# Patient Record
Sex: Female | Born: 1955 | Race: Black or African American | Hispanic: No | Marital: Single | Smoking: Never smoker
Health system: Southern US, Community
[De-identification: ages and names within clinical notes are randomized; demographics above are authoritative.]

## PROBLEM LIST (undated history)

## (undated) DIAGNOSIS — I350 Nonrheumatic aortic (valve) stenosis: Secondary | ICD-10-CM

## (undated) DIAGNOSIS — E119 Type 2 diabetes mellitus without complications: Secondary | ICD-10-CM

## (undated) DIAGNOSIS — M1990 Unspecified osteoarthritis, unspecified site: Secondary | ICD-10-CM

## (undated) DIAGNOSIS — T8859XA Other complications of anesthesia, initial encounter: Secondary | ICD-10-CM

## (undated) DIAGNOSIS — T4145XA Adverse effect of unspecified anesthetic, initial encounter: Secondary | ICD-10-CM

## (undated) DIAGNOSIS — I428 Other cardiomyopathies: Secondary | ICD-10-CM

## (undated) DIAGNOSIS — Z9889 Other specified postprocedural states: Secondary | ICD-10-CM

## (undated) DIAGNOSIS — I513 Intracardiac thrombosis, not elsewhere classified: Secondary | ICD-10-CM

## (undated) DIAGNOSIS — I4891 Unspecified atrial fibrillation: Principal | ICD-10-CM

## (undated) DIAGNOSIS — E785 Hyperlipidemia, unspecified: Secondary | ICD-10-CM

## (undated) DIAGNOSIS — R112 Nausea with vomiting, unspecified: Secondary | ICD-10-CM

## (undated) HISTORY — DX: Other cardiomyopathies: I42.8

## (undated) HISTORY — DX: Intracardiac thrombosis, not elsewhere classified: I51.3

## (undated) HISTORY — DX: Type 2 diabetes mellitus without complications: E11.9

## (undated) HISTORY — PX: JOINT REPLACEMENT: SHX530

## (undated) HISTORY — DX: Morbid (severe) obesity due to excess calories: E66.01

## (undated) HISTORY — DX: Hyperlipidemia, unspecified: E78.5

---

## 2003-10-02 ENCOUNTER — Other Ambulatory Visit (HOSPITAL_COMMUNITY): Admission: RE | Admit: 2003-10-02 | Discharge: 2003-10-13 | Payer: Self-pay | Admitting: Psychiatry

## 2003-11-01 ENCOUNTER — Encounter: Admission: RE | Admit: 2003-11-01 | Discharge: 2004-01-30 | Payer: Self-pay | Admitting: Unknown Physician Specialty

## 2003-11-17 ENCOUNTER — Encounter: Admission: RE | Admit: 2003-11-17 | Discharge: 2003-11-17 | Payer: Self-pay | Admitting: Psychiatry

## 2004-02-14 ENCOUNTER — Encounter: Admission: RE | Admit: 2004-02-14 | Discharge: 2004-05-14 | Payer: Self-pay | Admitting: Unknown Physician Specialty

## 2010-09-09 ENCOUNTER — Ambulatory Visit: Payer: Self-pay | Admitting: Gynecology

## 2013-07-28 ENCOUNTER — Emergency Department (HOSPITAL_COMMUNITY): Payer: Medicare Other

## 2013-07-28 ENCOUNTER — Encounter (HOSPITAL_COMMUNITY): Payer: Self-pay | Admitting: Emergency Medicine

## 2013-07-28 ENCOUNTER — Inpatient Hospital Stay (HOSPITAL_COMMUNITY)
Admission: EM | Admit: 2013-07-28 | Discharge: 2013-08-03 | DRG: 286 | Disposition: A | Discharge status: Home / Self Care | Payer: Medicare Other | Attending: Family Medicine | Admitting: Family Medicine

## 2013-07-28 DIAGNOSIS — I509 Heart failure, unspecified: Secondary | ICD-10-CM | POA: Diagnosis present

## 2013-07-28 DIAGNOSIS — T463X5A Adverse effect of coronary vasodilators, initial encounter: Secondary | ICD-10-CM | POA: Diagnosis not present

## 2013-07-28 DIAGNOSIS — E785 Hyperlipidemia, unspecified: Secondary | ICD-10-CM | POA: Diagnosis present

## 2013-07-28 DIAGNOSIS — I10 Essential (primary) hypertension: Secondary | ICD-10-CM | POA: Diagnosis present

## 2013-07-28 DIAGNOSIS — I5033 Acute on chronic diastolic (congestive) heart failure: Secondary | ICD-10-CM

## 2013-07-28 DIAGNOSIS — R0600 Dyspnea, unspecified: Secondary | ICD-10-CM

## 2013-07-28 DIAGNOSIS — I472 Ventricular tachycardia, unspecified: Secondary | ICD-10-CM | POA: Diagnosis present

## 2013-07-28 DIAGNOSIS — I4729 Other ventricular tachycardia: Secondary | ICD-10-CM | POA: Diagnosis not present

## 2013-07-28 DIAGNOSIS — Z87891 Personal history of nicotine dependence: Secondary | ICD-10-CM

## 2013-07-28 DIAGNOSIS — Z823 Family history of stroke: Secondary | ICD-10-CM

## 2013-07-28 DIAGNOSIS — I359 Nonrheumatic aortic valve disorder, unspecified: Secondary | ICD-10-CM | POA: Diagnosis present

## 2013-07-28 DIAGNOSIS — D649 Anemia, unspecified: Secondary | ICD-10-CM

## 2013-07-28 DIAGNOSIS — Z833 Family history of diabetes mellitus: Secondary | ICD-10-CM

## 2013-07-28 DIAGNOSIS — E1169 Type 2 diabetes mellitus with other specified complication: Secondary | ICD-10-CM | POA: Diagnosis present

## 2013-07-28 DIAGNOSIS — I428 Other cardiomyopathies: Secondary | ICD-10-CM | POA: Diagnosis present

## 2013-07-28 DIAGNOSIS — I513 Intracardiac thrombosis, not elsewhere classified: Secondary | ICD-10-CM | POA: Diagnosis present

## 2013-07-28 DIAGNOSIS — I5021 Acute systolic (congestive) heart failure: Secondary | ICD-10-CM | POA: Diagnosis present

## 2013-07-28 DIAGNOSIS — IMO0002 Reserved for concepts with insufficient information to code with codable children: Secondary | ICD-10-CM

## 2013-07-28 DIAGNOSIS — I4891 Unspecified atrial fibrillation: Principal | ICD-10-CM | POA: Diagnosis present

## 2013-07-28 DIAGNOSIS — Z96659 Presence of unspecified artificial knee joint: Secondary | ICD-10-CM

## 2013-07-28 DIAGNOSIS — I959 Hypotension, unspecified: Secondary | ICD-10-CM | POA: Diagnosis not present

## 2013-07-28 DIAGNOSIS — Z6841 Body Mass Index (BMI) 40.0 and over, adult: Secondary | ICD-10-CM

## 2013-07-28 DIAGNOSIS — I5041 Acute combined systolic (congestive) and diastolic (congestive) heart failure: Secondary | ICD-10-CM

## 2013-07-28 DIAGNOSIS — I429 Cardiomyopathy, unspecified: Secondary | ICD-10-CM | POA: Diagnosis present

## 2013-07-28 DIAGNOSIS — E669 Obesity, unspecified: Secondary | ICD-10-CM | POA: Diagnosis present

## 2013-07-28 DIAGNOSIS — R079 Chest pain, unspecified: Secondary | ICD-10-CM

## 2013-07-28 DIAGNOSIS — I5023 Acute on chronic systolic (congestive) heart failure: Secondary | ICD-10-CM | POA: Diagnosis present

## 2013-07-28 DIAGNOSIS — I249 Acute ischemic heart disease, unspecified: Secondary | ICD-10-CM | POA: Diagnosis present

## 2013-07-28 DIAGNOSIS — I2510 Atherosclerotic heart disease of native coronary artery without angina pectoris: Secondary | ICD-10-CM | POA: Diagnosis present

## 2013-07-28 DIAGNOSIS — G4733 Obstructive sleep apnea (adult) (pediatric): Secondary | ICD-10-CM | POA: Diagnosis present

## 2013-07-28 DIAGNOSIS — M1710 Unilateral primary osteoarthritis, unspecified knee: Secondary | ICD-10-CM | POA: Diagnosis present

## 2013-07-28 DIAGNOSIS — E119 Type 2 diabetes mellitus without complications: Secondary | ICD-10-CM | POA: Diagnosis present

## 2013-07-28 DIAGNOSIS — Z7982 Long term (current) use of aspirin: Secondary | ICD-10-CM

## 2013-07-28 DIAGNOSIS — R931 Abnormal findings on diagnostic imaging of heart and coronary circulation: Secondary | ICD-10-CM

## 2013-07-28 HISTORY — DX: Adverse effect of unspecified anesthetic, initial encounter: T41.45XA

## 2013-07-28 HISTORY — DX: Unspecified atrial fibrillation: I48.91

## 2013-07-28 HISTORY — DX: Unspecified osteoarthritis, unspecified site: M19.90

## 2013-07-28 HISTORY — DX: Other complications of anesthesia, initial encounter: T88.59XA

## 2013-07-28 HISTORY — DX: Other specified postprocedural states: R11.2

## 2013-07-28 HISTORY — DX: Other specified postprocedural states: Z98.890

## 2013-07-28 HISTORY — DX: Nonrheumatic aortic (valve) stenosis: I35.0

## 2013-07-28 LAB — CREATININE, SERUM
Creatinine, Ser: 0.89 mg/dL (ref 0.50–1.10)
GFR calc Af Amer: 82 mL/min — ABNORMAL LOW (ref >90)
GFR calc non Af Amer: 71 mL/min — ABNORMAL LOW (ref >90)

## 2013-07-28 LAB — BASIC METABOLIC PANEL
BUN: 14 mg/dL (ref 6–23)
CO2: 25 mEq/L (ref 19–32)
Calcium: 9.3 mg/dL (ref 8.4–10.5)
Chloride: 103 mEq/L (ref 96–112)
Creatinine, Ser: 0.91 mg/dL (ref 0.50–1.10)
GFR calc Af Amer: 80 mL/min — ABNORMAL LOW (ref >90)
GFR calc non Af Amer: 69 mL/min — ABNORMAL LOW (ref >90)
Glucose, Bld: 107 mg/dL — ABNORMAL HIGH (ref 70–99)
Potassium: 4 mEq/L (ref 3.7–5.3)
Sodium: 142 mEq/L (ref 137–147)

## 2013-07-28 LAB — I-STAT TROPONIN, ED: Troponin i, poc: 0 ng/mL (ref 0.00–0.08)

## 2013-07-28 LAB — CBC
HCT: 32.4 % — ABNORMAL LOW (ref 36.0–46.0)
HCT: 31.3 % — ABNORMAL LOW (ref 36.0–46.0)
Hemoglobin: 10.8 g/dL — ABNORMAL LOW (ref 12.0–15.0)
Hemoglobin: 10.3 g/dL — ABNORMAL LOW (ref 12.0–15.0)
MCH: 29.2 pg (ref 26.0–34.0)
MCH: 28.9 pg (ref 26.0–34.0)
MCHC: 33.3 g/dL (ref 30.0–36.0)
MCHC: 32.9 g/dL (ref 30.0–36.0)
MCV: 87.6 fL (ref 78.0–100.0)
MCV: 87.7 fL (ref 78.0–100.0)
Platelets: 246 10*3/uL (ref 150–400)
Platelets: 236 10*3/uL (ref 150–400)
RBC: 3.7 MIL/uL — ABNORMAL LOW (ref 3.87–5.11)
RBC: 3.57 MIL/uL — ABNORMAL LOW (ref 3.87–5.11)
RDW: 14.9 % (ref 11.5–15.5)
RDW: 14.9 % (ref 11.5–15.5)
WBC: 6 10*3/uL (ref 4.0–10.5)
WBC: 6.3 10*3/uL (ref 4.0–10.5)

## 2013-07-28 LAB — D-DIMER, QUANTITATIVE: D-Dimer, Quant: 1.31 ug/mL-FEU — ABNORMAL HIGH (ref 0.00–0.48)

## 2013-07-28 LAB — HEMOGLOBIN A1C
Hgb A1c MFr Bld: 6.5 % — ABNORMAL HIGH (ref <5.7)
Mean Plasma Glucose: 140 mg/dL — ABNORMAL HIGH (ref <117)

## 2013-07-28 LAB — PRO B NATRIURETIC PEPTIDE: Pro B Natriuretic peptide (BNP): 1219 pg/mL — ABNORMAL HIGH (ref 0–125)

## 2013-07-28 LAB — TROPONIN I
Troponin I: <0.3 ng/mL (ref <0.30)
Troponin I: <0.3 ng/mL (ref <0.30)

## 2013-07-28 MED ORDER — FUROSEMIDE 10 MG/ML IJ SOLN
40.0000 mg | Freq: Three times a day (TID) | INTRAMUSCULAR | Status: DC
  Administered 2013-07-28 – 2013-07-29 (×5): 40 mg via INTRAVENOUS
  Filled 2013-07-28 (×6): qty 4

## 2013-07-28 MED ORDER — METOPROLOL SUCCINATE ER 25 MG PO TB24
25.0000 mg | ORAL_TABLET | Freq: Every day | ORAL | Status: DC
  Filled 2013-07-28: qty 1

## 2013-07-28 MED ORDER — ACETAMINOPHEN 325 MG PO TABS
325.0000 mg | ORAL_TABLET | Freq: Four times a day (QID) | ORAL | Status: DC | PRN
  Administered 2013-07-29: 325 mg via ORAL
  Filled 2013-07-28: qty 1

## 2013-07-28 MED ORDER — VITAMIN D-3 125 MCG (5000 UT) PO TABS: 5000.0000 [IU] | ORAL_TABLET | Freq: Every day | ORAL | Status: DC

## 2013-07-28 MED ORDER — HEPARIN SODIUM (PORCINE) 5000 UNIT/ML IJ SOLN
5000.0000 [IU] | Freq: Three times a day (TID) | INTRAMUSCULAR | Status: DC
  Filled 2013-07-28 (×3): qty 1

## 2013-07-28 MED ORDER — METOPROLOL SUCCINATE ER 50 MG PO TB24
50.0000 mg | ORAL_TABLET | Freq: Two times a day (BID) | ORAL | Status: DC
  Administered 2013-07-28 – 2013-08-03 (×12): 50 mg via ORAL
  Filled 2013-07-28 (×15): qty 1

## 2013-07-28 MED ORDER — ASPIRIN 81 MG PO CHEW
324.0000 mg | CHEWABLE_TABLET | Freq: Once | ORAL | Status: AC
  Administered 2013-07-28: 324 mg via ORAL
  Filled 2013-07-28: qty 4

## 2013-07-28 MED ORDER — TRIAMTERENE-HCTZ 37.5-25 MG PO TABS
1.0000 | ORAL_TABLET | Freq: Every day | ORAL | Status: DC
  Filled 2013-07-28: qty 1

## 2013-07-28 MED ORDER — ATORVASTATIN CALCIUM 40 MG PO TABS
40.0000 mg | ORAL_TABLET | Freq: Every day | ORAL | Status: DC
  Administered 2013-07-28 – 2013-08-02 (×6): 40 mg via ORAL
  Filled 2013-07-28 (×9): qty 1

## 2013-07-28 MED ORDER — NITROGLYCERIN 0.4 MG SL SUBL: 0.4000 mg | SUBLINGUAL_TABLET | SUBLINGUAL | Status: DC | PRN

## 2013-07-28 MED ORDER — APIXABAN 5 MG PO TABS
5.0000 mg | ORAL_TABLET | Freq: Two times a day (BID) | ORAL | Status: DC
  Administered 2013-07-28: 5 mg via ORAL
  Filled 2013-07-28 (×3): qty 1

## 2013-07-28 MED ORDER — VITAMIN D3 25 MCG (1000 UNIT) PO TABS
5000.0000 [IU] | ORAL_TABLET | Freq: Every day | ORAL | Status: DC
  Administered 2013-07-28 – 2013-08-02 (×6): 5000 [IU] via ORAL
  Filled 2013-07-28 (×7): qty 5

## 2013-07-28 MED ORDER — ASPIRIN EC 325 MG PO TBEC: 325.0000 mg | DELAYED_RELEASE_TABLET | Freq: Every day | ORAL | Status: DC

## 2013-07-28 MED ORDER — POTASSIUM CHLORIDE CRYS ER 20 MEQ PO TBCR
40.0000 meq | EXTENDED_RELEASE_TABLET | Freq: Every day | ORAL | Status: DC
  Administered 2013-07-28 – 2013-08-02 (×6): 40 meq via ORAL
  Filled 2013-07-28 (×7): qty 2

## 2013-07-28 NOTE — ED Notes (Signed)
Pt transported to xray 

## 2013-07-28 NOTE — Care Management Note (Addendum)
  Page 1 of 1   07/28/2013     3:14:51 PM   CARE MANAGEMENT NOTE 07/28/2013  Patient:  Kimberly [name], Kimberly [name]   Account Number:  [id number]  Date Initiated:  07/28/2013  Documentation initiated by:  Mariann Laster  Subjective/Objective Assessment:   Admitted with shortness of breath and chest pressure     Action/Plan:   CM to follow for dispositon needs   Anticipated DC Date:  07/28/2013   Anticipated DC Plan:  HOME/SELF CARE         Choice offered to / List presented to:             Status of service:  In process, will continue to follow Medicare Important Message given?   (If response is "NO", the following Medicare IM given date fields will be blank) Date Medicare IM given:   Date Additional Medicare IM given:    Discharge Disposition:    Per UR Regulation:    If discussed at Long Length of Stay Meetings, dates discussed:    Comments:

## 2013-07-28 NOTE — ED Provider Notes (Signed)
TIME SEEN: 10:15 AM  CHIEF COMPLAINT: Shortness of breath, chest pressure  HPI: Patient is a 58 y.o. F with h/o atrial fibrillation not on anticoagulation, mild aortic stenosis, hyperlipidemia who presents emergency department with one month of intermittent shortness of breath with exertion and a "pulling" and a pressure-like substernal chest pain. She reports that her symptoms have gotten progressively worse. Her last stress test was in July 2014 and showed no ischemic changes.  Her echocardiogram at that time showed an EF of 60-65% with mild aortic stenosis. She denies a history of hypertension or diabetes. History of tobacco use. No history of PE or DVT. No recent prolonged immobilization, surgery, fracture, exogenous hormone use. She is not short of breath or having chest pain currently. She denies any associated nausea vomiting, diaphoresis or dizziness. She is having bilateral ankle swelling that has been present for the past month as well. No orthopnea.  ROS: See HPI Constitutional: no fever  Eyes: no drainage  ENT: no runny nose   Cardiovascular:  chest pain  Resp:  SOB  GI: no vomiting GU: no dysuria Integumentary: no rash  Allergy: no hives  Musculoskeletal:  leg swelling  Neurological: no slurred speech ROS otherwise negative  PAST MEDICAL HISTORY/PAST SURGICAL HISTORY:  Past Medical History  Diagnosis Date  . Atrial fibrillation   . Aortic stenosis     MEDICATIONS:  Prior to Admission medications   Medication Sig Start Date End Date Taking? Authorizing Provider  acetaminophen (TYLENOL) 325 MG tablet Take 325 mg by mouth every 6 (six) hours as needed for moderate pain.   Yes Historical Provider, MD  aspirin EC 325 MG tablet Take 325 mg by mouth daily.   Yes Historical Provider, MD  Cholecalciferol (VITAMIN D-3) 5000 UNITS TABS Take 5,000 Units by mouth daily.   Yes Historical Provider, MD  GARLIC PO Take 2 tablets by mouth daily.   Yes Historical Provider, MD  metoprolol  succinate (TOPROL-XL) 25 MG 24 hr tablet Take 25 mg by mouth daily.   Yes Historical Provider, MD  nitroGLYCERIN (NITROSTAT) 0.4 MG SL tablet Place 0.4 mg under the tongue every 5 (five) minutes as needed for chest pain.   Yes Historical Provider, MD  pravastatin (PRAVACHOL) 40 MG tablet Take 40 mg by mouth daily.   Yes Historical Provider, MD  Prenatal Vit-Iron Carbonyl-FA (PRENATAL PLUS IRON PO) Take 1 tablet by mouth daily.   Yes Historical Provider, MD  triamterene-hydrochlorothiazide (MAXZIDE-25) 37.5-25 MG per tablet Take 1 tablet by mouth daily.   Yes Historical Provider, MD    ALLERGIES:  No Known Allergies  SOCIAL HISTORY:  History  Substance Use Topics  . Smoking status: Not on file  . Smokeless tobacco: Not on file  . Alcohol Use: Not on file    FAMILY HISTORY: No family history on file.  EXAM: BP 127/75  Pulse 90  Temp(Src) 98.2 F (36.8 C) (Oral)  Resp 22  Ht 5\' 6"  (1.676 m)  Wt 371 lb 8 oz (168.511 kg)  BMI 59.99 kg/m2  SpO2 95% CONSTITUTIONAL: Alert and oriented and responds appropriately to questions. Well-appearing; well-nourished, morbidly obese HEAD: Normocephalic EYES: Conjunctivae clear, PERRL ENT: normal nose; no rhinorrhea; moist mucous membranes; pharynx without lesions noted NECK: Supple, no meningismus, no LAD  CARD: RRR; S1 and S2 appreciated; no murmurs, no clicks, no rubs, no gallops RESP: Normal chest excursion without splinting or tachypnea; breath sounds clear and equal bilaterally; no wheezes, no rhonchi, no rales, exam is limited due to  patient's morbid obesity ABD/GI: Normal bowel sounds; non-distended; soft, non-tender, no rebound, no guarding BACK:  The back appears normal and is non-tender to palpation, there is no CVA tenderness EXT: Normal ROM in all joints; non-tender to palpation; bilateral non-pitting edema to patient's feet and ankles; normal capillary refill; no cyanosis    SKIN: Normal color for age and race; warm NEURO: Moves  all extremities equally PSYCH: The patient's mood and manner are appropriate. Grooming and personal hygiene are appropriate.  MEDICAL DECISION MAKING: Patient here with chest pain, shortness breath or exertion for the past month. She is currently asymptomatic and hemodynamically stable. She is in atrial fibrillation with a rate ranging between 90s to 120s. She is normotensive. Concern for ACS given her symptoms and risk factors. We'll obtain cardiac labs, chest x-ray. Patient will likely need admission. Her PCP is Dr. Nevin Bloodgood with [facility].  ED PROGRESS: Patient's labs are unremarkable other than mild elevated BNP. Troponin negative. Chest x-ray clear. Discussed with family medicine for admission. Patient agrees with this plan.     EKG Interpretation  Date/Time:  Thursday July 28 2013 09:00:40 EDT Ventricular Rate:  111 PR Interval:    QRS Duration: 86 QT Interval:  334 QTC Calculation: 878 R Axis:   75 Text Interpretation:  Atrial fibrillation with rapid ventricular response with premature ventricular or aberrantly conducted complexes Abnormal ECG No old tracing to compare Confirmed by Devynn Hessler,  DO, Mariesa Grieder [phone]) on 07/28/2013 9:54:56 AM         Grant, DO 07/28/13 1215

## 2013-07-28 NOTE — Consult Note (Signed)
Reason for Consult: CHF/ Atrial Fibrillation w/ RVR Referring Physician: Family Medicine PCP: Dr. Gery Pray [facility])  HPI: Kimberly [name] is a 58 y/o obese AAF with a history of atrial fibrillation and treated HLD. She is followed medically by Dr. Kathlene Cote of [facility]. She states that she was first diagnosed with atrial fibrillation a little over a year ago. She has not been on oral anticoagulation, but has been on daily ASA. She is 25 mg of Toprol-XL for rate control. She was referred to a cardiologist in [facility] when first diagnosed, but has not returned for follow-up since the initial visit.  She presented to the [facility] ER earlier today with a complaint of DOE, palpitations and mild substernal chest discomfort. Her symptoms started several weeks ago, but have progressively worsened. She states that she was out shopping yesterday and was limited by dyspnea and chest heaviness, causing her to end her shopping activities early. She denies dyspnea at rest. No orthopnea or PND, but she has noticed bilateral LEE. Her chest pain yesterday was described as a "heavy" sensation. It was substernal and brought on by exertion. She denies radiation to the neck, jaw, back and upper extremities. No pain at rest. She also experienced palpitations, but denies dizziness, syncope/ near syncope. Today she had recurrent symptoms and decided to come to the ER for evaluation.  On arrival, an EKG demonstrated atrial fibrillation with a rapid ventricular response. Ventricular rate was 111. Initial troponin was negative. BNP elevated at 1,219. CXR demonstrated mild cardiomegaly but no edema or consolidation. She was admitted by [facility].     History   Social History  . Marital Status: Single    Spouse Name: N/A    Number of Children: N/A  . Years of Education: N/A   Social History Main Topics  . Smoking status: None  . Smokeless tobacco: None  . Alcohol Use: No  . Drug Use: None  .  Sexual Activity: None   Other Topics Concern  . None   Social History Narrative  . None   Family History  Problem Relation Age of Onset  . CVA Mother     No Known Allergies   Prior to Admission medications   Medication Sig Start Date End Date Taking? Authorizing Provider  acetaminophen (TYLENOL) 325 MG tablet Take 325 mg by mouth every 6 (six) hours as needed for moderate pain.   Yes Historical Provider, MD  aspirin EC 325 MG tablet Take 325 mg by mouth daily.   Yes Historical Provider, MD  Cholecalciferol (VITAMIN D-3) 5000 UNITS TABS Take 5,000 Units by mouth daily.   Yes Historical Provider, MD  GARLIC PO Take 2 tablets by mouth daily.   Yes Historical Provider, MD  metoprolol succinate (TOPROL-XL) 25 MG 24 hr tablet Take 25 mg by mouth daily.   Yes Historical Provider, MD  nitroGLYCERIN (NITROSTAT) 0.4 MG SL tablet Place 0.4 mg under the tongue every 5 (five) minutes as needed for chest pain.   Yes Historical Provider, MD  pravastatin (PRAVACHOL) 40 MG tablet Take 40 mg by mouth daily.   Yes Historical Provider, MD  Prenatal Vit-Iron Carbonyl-FA (PRENATAL PLUS IRON PO) Take 1 tablet by mouth daily.   Yes Historical Provider, MD  triamterene-hydrochlorothiazide (MAXZIDE-25) 37.5-25 MG per tablet Take 1 tablet by mouth daily.   Yes Historical Provider, MD    Review of Systems  Constitutional: Positive for malaise/fatigue. Negative for fever, chills and diaphoresis.  Respiratory: Positive for shortness  of breath.   Cardiovascular: Positive for chest pain, palpitations and leg swelling. Negative for orthopnea and PND.  Gastrointestinal: Positive for abdominal pain. Negative for nausea and vomiting.  Neurological: Negative for dizziness, loss of consciousness and weakness.  Endo/Heme/Allergies: Does not bruise/bleed easily.  All other systems reviewed and are negative.    Filed Vitals:   07/28/13 1340  BP: 155/81  Pulse: 136  Temp: 98.6 F (37 C)  Resp: 20   Physical  Exam:  General: NAD, obese Neck: JVP 12 cm, no thyromegaly or thyroid nodule.  Lungs: Clear to auscultation bilaterally with normal respiratory effort. CV: Nondisplaced PMI.  Heart mildly tachy, irregular S1/S2, no S3/S4, no murmur.  1+ ankle edema.  No carotid bruit.  Normal pedal pulses.  Abdomen: Soft, nontender, no hepatosplenomegaly, no distention.  Skin: Intact without lesions or rashes.  Neurologic: Alert and oriented x 3.  Psych: Normal affect. Extremities: No clubbing or cyanosis.  HEENT: Normal.   Assessment/Plan:   Active Problems:   Chest pain   Atrial fibrillation with rapid ventricular response   CHF (congestive heart failure)  Please see attending note below.   Kimberly Jester, PA   Patient seen with PA, agree with the above note.   58 yo with history of persistent atrial fibrillation presented with acute on chronic presumed diastolic CHF with atrial fibrillation and mild RVR.   1. Atrial fibrillation: Patient has been followed at [facility].  Atrial fibrillation has been present for ? 1 year (not totally sure of time frame).  She never had an attempt at cardioversion. She has not been anticoagulated.  She now presents with atrial fibrillation with mild RVR and acute on chronic diastolic CHF.  CHADSVASC = 2 (gender, CHF).   - Would start apixaban 5 mg bid given CHADSVASC =2 and no significant bleeding history.  - Increase Toprol XL to 50 mg bid for better rate control.  - Would consider an attempt at cardioversion as this has never been tried and she presents with CHF. If she remains in the hospital on Monday, can do TEE-guided cardioversion (> 5 dose Eliquis).  If she goes home over the weekend, can either bring back for TEE-guided cardioversion some time next week or anticoagulate for 1 month then cardiovert.  - Check TSH 2. Acute on chronic diastolic CHF: Echo in 1/66 at [facility] per report showed normal EF.  She is volume overloaded on  exam with elevated JVP and significant exertional dyspnea.  - Stop triamterene/HCTZ - Start Lasix 40 mg IV every 8 hours. - Will get echo to reassess EF.  3. Chest pressure: Suspect this is more related to CHF/volume overload.  Initial troponin negative.  Can rule out MI with serial enzymes.  If rules out and normal EF, no further ischemic workup.   [name] 07/28/2013 5:20 PM

## 2013-07-28 NOTE — ED Notes (Signed)
Pt with hx of afib to ED c/o increasing sob x 1 week that worsened yesterday.  Pt c/o increases sob on ambulation.  Pt also c/o some substernal chest pain.

## 2013-07-28 NOTE — H&P (Signed)
[facility] Admission History and Physical Service Pager: [phone]  Patient name: Kimberly [name] Medical record number: [id number] Date of birth: [birth year] Age: 58 y.o. Gender: female  Primary Care Provider: Omer Jack, MD Outpatient cardiologist: [facility] in [place] Dr Claudie Leach Consultants: Cards  Code Status: Full per discussion with patient  Chief Complaint: Short of breath and chest "pulling"  Assessment and Plan: Kimberly [name] is a 58 y.o. female presenting with 2 month progressive SOB and substernal chest "pulling." PMH is significant for atrial fibrillation, obesity, hyperlipidemia and knee replacement.   # Worsening SOB with substernal exertional chest pulling/pressure: Asymptomatic currently, trop neg x 1, BNP 1219, Hgb 10.8. Concern for ACS/unstable angina, PE, but most likely new CHF exacerbation in setting of atrial fibrillation with intermittent RVR in ED (CXR borderline cardiomegaly with ?pulmonary edema). PNA or pleural effusion not likely with CXR findings of no infiltrate. Stress test last year - hand-written report looking nl with nl echo (last year), ef 60-65% brought with her. EKG with no signs of ischemia. WELLS criteria: 1.5 points, making PE unlikely. HEART score 4 (HTN, age, risk factors).  - Will admit to [facility] for observation on telemetry, attending Dr Mingo Amber. - Chest pain r/o with AM ekg, cycling troponins - Consulted cardiology given this sounds like unstable angina with chest pressure and dyspnea that is worse exertionally. Will await recs on stress myoview vs cath vs workup above. - Risk stratification with A1c and AM fasting lipid panel. - Echocardiogram given new/worsened SOB since last echo, with LE edema (chronic). - D-dimer to r/o PE.  # Atrial Fibrillation - Currently in RVR up to 120-130s, mostly in high 100s and 90s. Not on anticoagulation. Concern for PE with hx and progressive  SOB. BP stable, O2 sats normal on room air. - Consulted cardiology. Will await recs on if they think this patient should be anticoagulated. Chads(2) score 1 for HTN. Continuing ASA 325mg  alone may be reasonable.  - Also consider diltiazem if she is more consistently in RVR.   # Hyperlipidemia - On low-intensity statin at home. - Will increase to lipitor 40mg  daily and check fasting lipid panel.  # Hypertension - Stable. - Monitor on home metoprolol XL 25mg  daily and triamterene-HCTZ 37.5-25mg  daily.  # H/o anemia - Hgb 10.8. No previous values for comparison. - If systolic CHF, would add iron PO.  FEN/GI: NPO until troponin negative x 2 Prophylaxis: SQ heparin  Disposition: Admit to family medicine inpatient service for observation in telemetry, pending cardiac workup and consultation.  History of Present Illness: Kimberly [name] is a 57 y.o. female presenting with progressive shortness of breath and chest "pulling." Patient states has been having "heart problems" which she describes as an arrhythmia for a while now which is followed by her PCP. However, over the past ~1  month she has had increase in SOB to the point that yesterday was the worst it has been. She is typically able to walk around the entire [employer] store but yesterday was only able to walk around half of the store. She states her diet has been bad lately, eating more than normal for her. Additionally she feels "a pull" from stomach to the upper substernal area which worsens with exertion and is relieved with rest. She also had this same "pull" when she tries to get into her expedition. She has no had any nausea, vomiting or diaphoresis. Denies any cough. Denies orthopnea. Has not taken home PRN nitro.  Patient has had an echocardiogram at [facility] on 12/02/2012 that shows, albeit a limited study,  mildly thickened aortic valve, mild aortic stenosis and EF 60-65%. A Nuclear cardiology report from 11/25/2012 shows, albeit  a limited study again, normal perfusion imaging with only mild apical attenuation artifact. LV systolic function was low normal without regional wall motion abnormalities and LV EF 48%.  Review Of Systems: Per HPI with the following additions: No dizziness or syncope. No cough, sneeze, URI, fevers, abd pain, nausea, vomiting, or diarrhea. Some increased urinary frequency and polydipsia.  Chronic leg swelling, no asymmetry or erythema. Otherwise 12 point review of systems was performed and was unremarkable.  Patient Active Problem List   Diagnosis Date Noted  . Chest pain 07/28/2013  . ACS (acute coronary syndrome) 07/28/2013   Past Medical History: Past Medical History  Diagnosis Date  . Atrial fibrillation   . Aortic stenosis   H/o knee replacement Planning right knee replacement No h/o HTN, DM HLD Anemia  Past Surgical History: Past Surgical History  Procedure Laterality Date  . Joint replacement     Social History: History  Substance Use Topics  . Smoking status: Not on file  . Smokeless tobacco: Not on file  . Alcohol Use: No  Lives with fiancee Does not work - disability after fall No h/o smoking, etoh use or drug use  Please also refer to relevant sections of EMR.  Family History: No family history on file. Mother - h/o "heart problems" with afib in 79s, living; stroke when 7 DM sister and mother  Allergies and Medications: No Known Allergies No current facility-administered medications on file prior to encounter.   No current outpatient prescriptions on file prior to encounter.  Per paperwrk patient brings in: Acetaminophen 325mg  prn for pain Mucinex DM 60-1200mg  tablet ER 12 hour, bid Maxzide 25, 37.5-25mg  tablet once daily Metoprolol succ ER 25mg  once daily ASA 325mg  once daily Nitrostat 0.4mg  tab sublingual Allegra 180mg  once daily Fish Oil 1000mg , 2 in AM, 1 in PM Vitamin D3 max 5000U capsule once daily Prenatal plus iron 29-1mg  once  daily Pravastatin 40mg  qhs  Objective: BP 155/81  Pulse 136  Temp(Src) 98.6 F (37 C) (Oral)  Resp 20  Ht 5\' 6"  (1.676 m)  Wt 367 lb 4.6 oz (166.6 kg)  BMI 59.31 kg/m2  SpO2 100% Exam: General: NAD, morbidly obese HEENT: normocephalic, nontraumatic, normal hearing, PERRL, EOMI, sclera clear, o/p clear Neck: obese, JVD to mid-neck, ~7cm (limited exam due to habitus) Cardiovascular: distant heart sounds only appreciable in upper right sternal border, 2+ bilateral radial and DP pulses, irregularly irregular at ~100s heart rate Respiratory: distant lung sounds, clear bilaterally throughout upper and lower lobes Abdomen: obese, normal bowel sounds, nontender, nondistended  Extremities: 3+ pitting edema to mid-shin, 1+ pitting edema knee to thigh Skin: no extremity erythema with slight darker pigmentation on L shin Neuro: CN III-XII intact grossly, normal speech, awake and alert  Labs and Imaging: CBC BMET   Recent Labs Lab 07/28/13 0720  WBC 6.0  HGB 10.8*  HCT 32.4*  PLT 246    Recent Labs Lab 07/28/13 0720  NA 142  K 4.0  CL 103  CO2 25  BUN 14  CREATININE 0.91  GLUCOSE 107*  CALCIUM 9.3     ProBNP: 1219 poc troponin i: 0  EKG: atral fibrillation with RVR 111 and occasional PVCs  Chest x-ray: IMPRESSION:  Heart borderline enlarged. Suspect underlying emphysematous change.  No edema or consolidation.  H&P Started by Early Osmond, MS4 Completed by Hilton Sinclair, MD 07/28/2013, 3:53 PM PGY-2, [place] Intern pager: [phone], text pages welcome

## 2013-07-28 NOTE — ED Notes (Signed)
Spoke with main lab re: BNP, per lab the lab can be completed with blood draws collected previously sent down today

## 2013-07-29 ENCOUNTER — Encounter (HOSPITAL_COMMUNITY)
Admission: EM | Disposition: A | Discharge status: Home / Self Care | Payer: Medicare Other | Source: Home / Self Care | Attending: Family Medicine

## 2013-07-29 ENCOUNTER — Encounter (HOSPITAL_COMMUNITY): Payer: Self-pay | Admitting: Radiology

## 2013-07-29 ENCOUNTER — Observation Stay (HOSPITAL_COMMUNITY): Payer: Medicare Other

## 2013-07-29 ENCOUNTER — Encounter (HOSPITAL_COMMUNITY): Payer: Medicare Other | Admitting: Anesthesiology

## 2013-07-29 ENCOUNTER — Inpatient Hospital Stay (HOSPITAL_COMMUNITY): Payer: Medicare Other | Admitting: Anesthesiology

## 2013-07-29 DIAGNOSIS — E1169 Type 2 diabetes mellitus with other specified complication: Secondary | ICD-10-CM | POA: Diagnosis present

## 2013-07-29 DIAGNOSIS — I5033 Acute on chronic diastolic (congestive) heart failure: Secondary | ICD-10-CM

## 2013-07-29 DIAGNOSIS — R079 Chest pain, unspecified: Secondary | ICD-10-CM

## 2013-07-29 DIAGNOSIS — R0609 Other forms of dyspnea: Secondary | ICD-10-CM

## 2013-07-29 DIAGNOSIS — I4729 Other ventricular tachycardia: Secondary | ICD-10-CM | POA: Diagnosis not present

## 2013-07-29 DIAGNOSIS — I509 Heart failure, unspecified: Secondary | ICD-10-CM

## 2013-07-29 DIAGNOSIS — D649 Anemia, unspecified: Secondary | ICD-10-CM

## 2013-07-29 DIAGNOSIS — E669 Obesity, unspecified: Secondary | ICD-10-CM | POA: Diagnosis present

## 2013-07-29 DIAGNOSIS — R0989 Other specified symptoms and signs involving the circulatory and respiratory systems: Secondary | ICD-10-CM

## 2013-07-29 DIAGNOSIS — I4891 Unspecified atrial fibrillation: Principal | ICD-10-CM

## 2013-07-29 DIAGNOSIS — I472 Ventricular tachycardia: Secondary | ICD-10-CM | POA: Diagnosis not present

## 2013-07-29 DIAGNOSIS — I059 Rheumatic mitral valve disease, unspecified: Secondary | ICD-10-CM

## 2013-07-29 HISTORY — PX: TEE WITHOUT CARDIOVERSION: SHX5443

## 2013-07-29 HISTORY — PX: CARDIOVERSION: SHX1299

## 2013-07-29 LAB — BASIC METABOLIC PANEL
BUN: 14 mg/dL (ref 6–23)
CO2: 28 mEq/L (ref 19–32)
Calcium: 9.9 mg/dL (ref 8.4–10.5)
Chloride: 101 mEq/L (ref 96–112)
Creatinine, Ser: 0.91 mg/dL (ref 0.50–1.10)
GFR calc Af Amer: 80 mL/min — ABNORMAL LOW (ref >90)
GFR calc non Af Amer: 69 mL/min — ABNORMAL LOW (ref >90)
Glucose, Bld: 126 mg/dL — ABNORMAL HIGH (ref 70–99)
Potassium: 3.8 mEq/L (ref 3.7–5.3)
Sodium: 143 mEq/L (ref 137–147)

## 2013-07-29 LAB — TROPONIN I: Troponin I: <0.3 ng/mL (ref <0.30)

## 2013-07-29 LAB — LIPID PANEL
Cholesterol: 178 mg/dL (ref 0–200)
HDL: 46 mg/dL (ref >39)
LDL Cholesterol: 115 mg/dL — ABNORMAL HIGH (ref 0–99)
Total CHOL/HDL Ratio: 3.9 RATIO
Triglycerides: 85 mg/dL (ref <150)
VLDL: 17 mg/dL (ref 0–40)

## 2013-07-29 LAB — TSH: TSH: 1.712 u[IU]/mL (ref 0.350–4.500)

## 2013-07-29 LAB — APTT: aPTT: 29 seconds (ref 24–37)

## 2013-07-29 LAB — HEPARIN LEVEL (UNFRACTIONATED): Heparin Unfractionated: 0.46 IU/mL (ref 0.30–0.70)

## 2013-07-29 SURGERY — ECHOCARDIOGRAM, TRANSESOPHAGEAL
Anesthesia: Moderate Sedation

## 2013-07-29 MED ORDER — DILTIAZEM HCL 60 MG PO TABS
60.0000 mg | ORAL_TABLET | Freq: Three times a day (TID) | ORAL | Status: DC
  Administered 2013-07-29 – 2013-07-30 (×3): 60 mg via ORAL
  Filled 2013-07-29 (×7): qty 1

## 2013-07-29 MED ORDER — HEPARIN (PORCINE) IN NACL 100-0.45 UNIT/ML-% IJ SOLN
15.0000 [IU]/kg/h | INTRAMUSCULAR | Status: DC
  Administered 2013-07-29: 15 [IU]/kg/h via INTRAVENOUS
  Filled 2013-07-29 (×3): qty 250

## 2013-07-29 MED ORDER — DEXTROSE 5 % IV SOLN
5.0000 mg/h | INTRAVENOUS | Status: DC
  Administered 2013-07-29: 5 mg/h via INTRAVENOUS
  Filled 2013-07-29: qty 100

## 2013-07-29 MED ORDER — FENTANYL CITRATE 0.05 MG/ML IJ SOLN
INTRAMUSCULAR | Status: AC
  Filled 2013-07-29: qty 2

## 2013-07-29 MED ORDER — SODIUM CHLORIDE 0.9 % IV SOLN
INTRAVENOUS | Status: DC | PRN
  Administered 2013-07-29: 14:00:00 via INTRAVENOUS

## 2013-07-29 MED ORDER — PROPOFOL 10 MG/ML IV BOLUS
INTRAVENOUS | Status: DC | PRN
  Administered 2013-07-29 (×2): 25 mg via INTRAVENOUS

## 2013-07-29 MED ORDER — MIDAZOLAM HCL 10 MG/2ML IJ SOLN
INTRAMUSCULAR | Status: DC | PRN
  Administered 2013-07-29: 2 mg via INTRAVENOUS
  Administered 2013-07-29: 1 mg via INTRAVENOUS
  Administered 2013-07-29: 2 mg via INTRAVENOUS

## 2013-07-29 MED ORDER — IOHEXOL 350 MG/ML SOLN
100.0000 mL | Freq: Once | INTRAVENOUS | Status: AC | PRN
  Administered 2013-07-29: 100 mL via INTRAVENOUS

## 2013-07-29 MED ORDER — HEPARIN BOLUS VIA INFUSION
4000.0000 [IU] | Freq: Once | INTRAVENOUS | Status: DC
  Filled 2013-07-29: qty 4000

## 2013-07-29 MED ORDER — FENTANYL CITRATE 0.05 MG/ML IJ SOLN
INTRAMUSCULAR | Status: DC | PRN
  Administered 2013-07-29 (×3): 25 ug via INTRAVENOUS

## 2013-07-29 MED ORDER — LIVING WELL WITH DIABETES BOOK
Freq: Once | Status: AC
  Administered 2013-07-29: 11:00:00
  Filled 2013-07-29: qty 1

## 2013-07-29 MED ORDER — SODIUM CHLORIDE 0.9 % IV SOLN: INTRAVENOUS | Status: DC

## 2013-07-29 MED ORDER — MIDAZOLAM HCL 5 MG/ML IJ SOLN
INTRAMUSCULAR | Status: AC
  Filled 2013-07-29: qty 1

## 2013-07-29 NOTE — Transfer of Care (Signed)
Immediate Anesthesia Transfer of Care Note  Patient: Kimberly [name]  Procedure(s) Performed: Procedure(s): TRANSESOPHAGEAL ECHOCARDIOGRAM (TEE) (N/A) CARDIOVERSION (N/A)  Patient Location: PACU and Endoscopy Unit  Anesthesia Type:General  Level of Consciousness: awake, alert  and oriented  Airway & Oxygen Therapy: Patient Spontanous Breathing and Patient connected to nasal cannula oxygen  Post-op Assessment: Report given to PACU RN and Post -op Vital signs reviewed and stable  Post vital signs: Reviewed and stable  Complications: No apparent anesthesia complications

## 2013-07-29 NOTE — Progress Notes (Signed)
Inpatient Diabetes Program Recommendations  AACE/ADA: New Consensus Statement on Inpatient Glycemic Control (2013)  Target Ranges:  Prepandial:   less than 140 mg/dL      Peak postprandial:   less than 180 mg/dL (1-2 hours)      Critically ill patients:  140 - 180 mg/dL   Reason for Visit: Received referral for this patient.  Pt with new diagnosis of DM2.  Spoke with pt about new diagnosis.  Discussed A1C results with her and explained what an A1C is, basic pathophysiology of DM Type 2, basic home care, importance of checking CBGs and maintaining good CBG control to prevent long-term and short-term complications.  RNs to provide ongoing basic DM education at bedside with this patient.  Have ordered educational booklet and DM videos.  Also ordered RD consult for further diet education.  Reviewed basic diet information with pt and encouraged pt to avoid regular soda, sweet tea, fruit juice, and other drinks sweetened with sugar or high fructose corn syrup.  Encouraged pt to pay attention to portion sizes.  Also encouraged pt to increase her exercise within her limits.  Pt has painful knees which makes exercise difficult.  Encouraged walking on flat surfaces with her cane and chair exercises.  Will follow. Wyn Quaker RN, MSN, CDE Diabetes Coordinator Inpatient Diabetes Program Team Pager: [phone] (8a-10p)

## 2013-07-29 NOTE — Consult Note (Signed)
PHARMACY CONSULT NOTE  Pharmacy Consult :  Heparin Indication : atrial fibrillation w/RVR   Allergies: No Known Allergies  Heparin Dosing weight : 100 kg  Vital Signs: BP 110/60  Pulse 109  Temp(Src) 98 F (36.7 C) (Oral)  Resp 19  Ht 5\' 6"  (1.676 m)  Wt 355 lb 3.2 oz (161.118 kg)  BMI 57.36 kg/m2  SpO2 94%  Active Problems: Principal Problem:   Atrial fibrillation with rapid ventricular response Active Problems:   Chest pain   CHF (congestive heart failure)   Morbid obesity   NSVT (nonsustained ventricular tachycardia)   Diabetes mellitus type 2 in obese   Labs:  Recent Labs  07/28/13 0720 07/28/13 1515 07/29/13 0540  HGB 10.8* 10.3*  --   HCT 32.4* 31.3*  --   PLT 246 236  --   CREATININE 0.91 0.89 0.91   No results found for this basename: INR, PROTIME   Estimated Creatinine Clearance: 109 ml/min (by C-G formula based on Cr of 0.91).  Medical / Surgical History: Past Medical History  Diagnosis Date  . Atrial fibrillation   . Aortic stenosis   . Complication of anesthesia   . PONV (postoperative nausea and vomiting)   . Dysrhythmia     ATRIAL FIB WITH RVR  . Shortness of breath   . CHF (congestive heart failure)   . Arthritis     KNEES   Past Surgical History  Procedure Laterality Date  . Joint replacement      Current Medication[s] Include: Prior to admission: Prescriptions prior to admission  Medication Sig Dispense Refill  . acetaminophen (TYLENOL) 325 MG tablet Take 325 mg by mouth every 6 (six) hours as needed for moderate pain.      [name] aspirin EC 325 MG tablet Take 325 mg by mouth daily.      . Cholecalciferol (VITAMIN D-3) 5000 UNITS TABS Take 5,000 Units by mouth daily.      [name] GARLIC PO Take 2 tablets by mouth daily.      . metoprolol succinate (TOPROL-XL) 25 MG 24 hr tablet Take 25 mg by mouth daily.      . nitroGLYCERIN (NITROSTAT) 0.4 MG SL tablet Place 0.4 mg under the tongue every 5 (five) minutes as needed for chest pain.       . pravastatin (PRAVACHOL) 40 MG tablet Take 40 mg by mouth daily.      . Prenatal Vit-Iron Carbonyl-FA (PRENATAL PLUS IRON PO) Take 1 tablet by mouth daily.      [name] triamterene-hydrochlorothiazide (MAXZIDE-25) 37.5-25 MG per tablet Take 1 tablet by mouth daily.        Scheduled:  Scheduled:  . atorvastatin  40 mg Oral q1800  . cholecalciferol  5,000 Units Oral Daily  . furosemide  40 mg Intravenous TID  . living well with diabetes book   Does not apply Once  . metoprolol succinate  50 mg Oral BID  . potassium chloride  40 mEq Oral Daily   Infusion[s]: Infusions:  . diltiazem (CARDIZEM) infusion     Antibiotic[s]: Anti-infectives   None     Assessment:  58 y.o.female with atrial fibrillation w/RVR who will receive Heparin bridging pending cardioversion.  Patient will likely receive Apixaban post-DCCV.  She has received Apixaban 1 dose > 12 hours prior to initiation of Heparin.  Goal of Therapy:  Heparin goal is Heparin level 0.3-0.7 units/ml.      Plan:  1.  Heparin bolus 4000 units IV now, then  2.  Begin Heparin infusion at 1500 units/hr 3.  Next Heparin level @ 1800 pm 4.  Daily heparin level and CBC.  Monitor for bleeding complications.    Natane Heward J,  Pharm.D.. 07/29/2013,  11:19 AM      

## 2013-07-29 NOTE — Preoperative (Signed)
Beta Blockers   Reason not to administer Beta Blockers:Not Applicable 

## 2013-07-29 NOTE — Progress Notes (Signed)
Interval progress note  D-dimer came back elevated at 1.31. If patient were to have a PE, this could explain why she went into RVR. Patient was started on apixiban 5mg  BID today by cardiology prior to d-dimer result, and per pharmacy this begins anticoagulating within ~2 hours. Will order chest CT angiogram to evaluate for PE. If positive, would need dose of 10mg  BID.   Hilton Sinclair, MD

## 2013-07-29 NOTE — Anesthesia Preprocedure Evaluation (Addendum)
Anesthesia Evaluation  Patient identified by MRN, date of birth, ID band Patient awake    Reviewed: Allergy & Precautions, H&P , NPO status , reviewed documented beta blocker date and time   Airway Mallampati: II TM Distance: >3 FB Neck ROM: Full    Dental  (+) Teeth Intact   Pulmonary shortness of breath,    Pulmonary exam normal       Cardiovascular +CHF + dysrhythmias Rhythm:Irregular Rate:Tachycardia     Neuro/Psych    GI/Hepatic   Endo/Other  diabetes  Renal/GU      Musculoskeletal   Abdominal (+) + obese,   Peds  Hematology   Anesthesia Other Findings   Reproductive/Obstetrics                          Anesthesia Physical Anesthesia Plan  ASA: III  Anesthesia Plan: General   Post-op Pain Management:    Induction: Intravenous  Airway Management Planned: Mask  Additional Equipment:   Intra-op Plan:   Post-operative Plan:   Informed Consent:   Plan Discussed with:   Anesthesia Plan Comments:         Anesthesia Quick Evaluation

## 2013-07-29 NOTE — Progress Notes (Signed)
Subjective: Feeling better.  No SOB, CP or dizziness  Objective: Vital signs in last 24 hours: Temp:  [97.5 F (36.4 C)-98.6 F (37 C)] 98 F (36.7 C) (03/13 0453) Pulse Rate:  [49-136] 109 (03/13 0453) Resp:  [19-24] 19 (03/13 0453) BP: (110-155)/(60-88) 110/60 mmHg (03/13 0453) SpO2:  [94 %-100 %] 94 % (03/13 0453) Weight:  [355 lb 3.2 oz (161.118 kg)-367 lb 4.6 oz (166.6 kg)] 355 lb 3.2 oz (161.118 kg) (03/13 0453) Last BM Date: 07/28/13  Intake/Output from previous day: 03/12 0701 - 03/13 0700 In: 30 [P.O.:30] Out: 3450 [Urine:3450] Intake/Output this shift: Total I/O In: 240 [P.O.:240] Out: -   Medications Current Facility-Administered Medications  Medication Dose Route Frequency Provider Last Rate Last Dose  . acetaminophen (TYLENOL) tablet 325 mg  325 mg Oral Q6H PRN Hilton Sinclair, MD      . apixaban Arne Cleveland) tablet 5 mg  5 mg Oral BID Lyda Jester, PA-C   5 mg at 07/28/13 2109  . atorvastatin (LIPITOR) tablet 40 mg  40 mg Oral q1800 Hilton Sinclair, MD   40 mg at 07/28/13 1717  . cholecalciferol (VITAMIN D) tablet 5,000 Units  5,000 Units Oral Daily Alveda Reasons, MD   5,000 Units at 07/28/13 2108  . furosemide (LASIX) injection 40 mg  40 mg Intravenous TID Lyda Jester, PA-C   40 mg at 07/28/13 2108  . metoprolol succinate (TOPROL-XL) 24 hr tablet 50 mg  50 mg Oral BID Brittainy Simmons, PA-C   50 mg at 07/28/13 2108  . nitroGLYCERIN (NITROSTAT) SL tablet 0.4 mg  0.4 mg Sublingual Q5 min PRN Hilton Sinclair, MD      . potassium chloride SA (K-DUR,KLOR-CON) CR tablet 40 mEq  40 mEq Oral Daily Brittainy Simmons, PA-C   40 mEq at 07/28/13 1726    PE: General appearance: alert, cooperative and no distress Neck: JVD mildly elevated. Lungs: clear to auscultation bilaterally Heart: irregularly irregular rhythm Extremities: Trace LEE Pulses: 2+ and symmetric Skin: Warm and dry Neurologic: Grossly normal  Lab Results:   Recent  Labs  07/28/13 0720 07/28/13 1515  WBC 6.0 6.3  HGB 10.8* 10.3*  HCT 32.4* 31.3*  PLT 246 236   BMET  Recent Labs  07/28/13 0720 07/28/13 1515 07/29/13 0540  NA 142  --  143  K 4.0  --  3.8  CL 103  --  101  CO2 25  --  28  GLUCOSE 107*  --  126*  BUN 14  --  14  CREATININE 0.91 0.89 0.91  CALCIUM 9.3  --  9.9   PT/INR No results found for this basename: LABPROT, INR,  in the last 72 hours Cholesterol  Recent Labs  07/29/13 0540  CHOL 178   Lipid Panel     Component Value Date/Time   CHOL 178 07/29/2013 0540   TRIG 85 07/29/2013 0540   HDL 46 07/29/2013 0540   CHOLHDL 3.9 07/29/2013 0540   VLDL 17 07/29/2013 0540   LDLCALC 115* 07/29/2013 0540     Assessment/Plan  Ms. Kerekes is a 59 y/o obese AAF with a history of atrial fibrillation and treated HLD. She is followed medically by Dr. Kathlene Cote of [facility]. She states that she was first diagnosed with atrial fibrillation a little over a year ago. She has not been on oral anticoagulation, but has been on daily ASA. She is 25 mg of Toprol-XL for rate control. She was referred to a cardiologist  in [facility] when first diagnosed, but has not returned for follow-up since the initial visit.    Complaints of DOE, palpitations and mild substernal chest discomfort   Principal Problem:   Atrial fibrillation with rapid ventricular response Active Problems:   CHF (congestive heart failure)   NSVT (nonsustained ventricular tachycardia)   Chest pain   Morbid obesity   Diabetes mellitus type 2 in obese    Plan:  She continues in afib RVR  113bpm but is still getting into the 180's.  She is also have short runs of NSVT.  Now on eliquis.  Toprol XL 50mg  BID.  Will add diltiazem IV.  Tee/dccv on Monday.  Elevated D-dimer-negative PE, mild venous congestion on CT angiogram.  Net fluids:  -3.4L.  Lasix at 40mg  IV tid.  She was just about lying flat when I entered the room with no SOB.  I think we can give her AM IV  lasix and then change to PO.  Echo pending.  Will need to slow her down first.   A1C 6.5- We had a long discussion about healthy eating.  Will refer to OP dietician.   No further CP.. Ruled out MI.   LOS: 1 day    Catrinia Racicot 07/29/2013 9:33 AM

## 2013-07-29 NOTE — Interval H&P Note (Signed)
History and Physical Interval Note:  07/29/2013 2:47 PM  Kimberly [name]  has presented today for surgery, with the diagnosis of afib  The various methods of treatment have been discussed with the patient and family. After consideration of risks, benefits and other options for treatment, the patient has consented to  Procedure(s): TRANSESOPHAGEAL ECHOCARDIOGRAM (TEE) (N/A) CARDIOVERSION (N/A) as a surgical intervention .  The patient's history has been reviewed, patient examined, no change in status, stable for surgery.  I have reviewed the patient's chart and labs.  Questions were answered to the patient's satisfaction.     [name]

## 2013-07-29 NOTE — Progress Notes (Signed)
  RD consulted for nutrition education regarding diabetes.   Lab Results  Component Value Date   HGBA1C 6.5* 07/28/2013    RD provided "Carbohydrate Counting for People with Diabetes" handout from the Academy of [employer] and Dietetics. Discussed different food groups and their effects on blood sugar, emphasizing carbohydrate-containing foods. Provided list of carbohydrates and recommended serving sizes of common foods.  Discussed importance of controlled and consistent carbohydrate intake throughout the day. Provided examples of ways to balance meals/snacks and encouraged intake of high-fiber, whole grain complex carbohydrates. Teach back method used.  Expect good compliance.  Note pt also with heart failure; pt reported swelling and fluid retention during visit. Briefly reviewed "Low Sodium Nutrition Therapy" handout with pt. Reviewed pt's dietary recall and discussed ways to reduce sodium in her diet. Provided "Sodium Content of Foods List" from the Academy of Nutrition and Dietetics.   Body mass index is 57.36 kg/(m^2). Pt meets criteria for Morbid Obesity based on current BMI.  Current diet order is NPO- pt did eat 75% of breakfast meal this morning. Labs and medications reviewed. No further nutrition interventions warranted at this time. RD contact information provided. If additional nutrition issues arise, please re-consult RD.  Pryor Ochoa RD, LDN Inpatient Clinical Dietitian Pager: [phone] After Hours Pager: [phone]

## 2013-07-29 NOTE — Progress Notes (Signed)
Family Medicine Teaching Service Daily Progress Note Intern Pager: [phone]  Patient name: Kimberly [name] Medical record number: [id number] Date of birth: Dec 31, 1955 Age: 58 y.o. Gender: female  Primary Care Provider: Omer Jack, MD Consultants: cards Code Status: full  Pt Overview and Major Events to Date:  3/12 - admitted with dyspnea  Assessment and Plan: Kimberly [name] is a 58 y.o. female presenting with 2 month progressive SOB and substernal chest "pulling." PMH is significant for atrial fibrillation, obesity, hyperlipidemia and knee replacement.   # Worsening SOB with substernal exertional chest pulling/pressure: Asymptomatic currently. HEART score 4 (HTN, age, risk factors).  - ddimer pos, CTA neg - trop neg x3 - EKG stable  - Cardiology consulting, appreciate recs - A1c 6.5, LDL 115, HDL 46  - Echocardiogram pending  - apixaban started 3/12, cardioversion on 3/16 if still here or outpatient if d/c'ed prior  # Atrial Fibrillation - mild RVR on admission to 120s-130s - rate still uncontrolled this am ~120 - cards consulting - metoprolol increased to 50 BID, may need further increase vs change in therapy  # Hyperlipidemia - On low-intensity statin at home.  - continue lipitor 40mg  daily - LDL 115, HDL 46 this am   # Hypertension - Stable.  - Monitor on home metoprolol XL and triamterene-HCTZ 37.5-25mg  daily.   # H/o anemia - Hgb 10.8. No previous values for comparison.  - If systolic CHF, would add iron PO.   # New DM: A1c 6.5 - discussed diet and activity changes vs. starting metformin - patient opts for trial of lifestyle modifications - dm educator consulted  FEN/GI: heart healthy, SLIV  Prophylaxis: SQ heparin  Disposition: home pending clinical improvement, rate control  Subjective: feeling much better, denies pain or dyspnea  Objective: Temp:  [97.5 F (36.4 C)-98.6 F (37 C)] 98 F (36.7 C) (03/13 0453) Pulse Rate:  [46-136] 109 (03/13  0453) Resp:  [18-24] 19 (03/13 0453) BP: (110-155)/(60-88) 110/60 mmHg (03/13 0453) SpO2:  [94 %-100 %] 94 % (03/13 0453) Weight:  [355 lb 3.2 oz (161.118 kg)-371 lb 8 oz (168.511 kg)] 355 lb 3.2 oz (161.118 kg) (03/13 0453) Physical Exam: General: NAD, morbidly obese  HEENT: normocephalic, nontraumatic, sclera clear Neck: obese, unable to visualize JVD (limited exam due to habitus)  Cardiovascular: distant heart sounds, 2+ bilateral radial and DP pulses, irregularly irregular at ~120s heart rate  Respiratory: distant lung sounds, clear bilaterally throughout upper and lower lobes  Abdomen: obese, nontender, nondistended  Extremities: 2+ pitting edema to mid-shin  Neuro: intact grossly, normal speech, awake and alert  Laboratory:  Recent Labs Lab 07/28/13 0720 07/28/13 1515  WBC 6.0 6.3  HGB 10.8* 10.3*  HCT 32.4* 31.3*  PLT 246 236    Recent Labs Lab 07/28/13 0720 07/28/13 1515 07/29/13 0540  NA 142  --  143  K 4.0  --  3.8  CL 103  --  101  CO2 25  --  28  BUN 14  --  14  CREATININE 0.91 0.89 0.91  CALCIUM 9.3  --  9.9  GLUCOSE 107*  --  126*   ProBNP: 1219  Troponin neg x3 A1c 6.5 D-dimer 1.31 TSH 1.712  Imaging/Diagnostic Tests: Chest x-ray: Heart borderline enlarged. Suspect underlying emphysematous change. No edema or consolidation.  EKG: atral fibrillation with RVR 111 and occasional PVCs, stable   CTA chest: No central pulmonary embolism. The segmental and subsegmental branches are nondiagnostic due to respiratory motion and contrast bolus timing. Cardiomegaly and mild  venous congestion. No overt edema or focal consolidation. Mild areas of mosaic attenuation suggest air trapping/ small airways disease.   Kimberly Roux, MD 07/29/2013, 7:59 AM PGY-1, [facility] Intern pager: [phone], text pages welcome

## 2013-07-29 NOTE — Progress Notes (Signed)
Echocardiogram 2D Echocardiogram has been performed.  Kimberly [name] 07/29/2013, 2:19 PM

## 2013-07-29 NOTE — H&P (Signed)
FMTS Attending Admission Note: [name] MD Personal pager:  [phone] FPTS Service Pager:  [phone]  I  have seen and examined this patient, reviewed their chart. I have discussed this patient with the resident. I agree with the resident's findings, assessment and care plan.  Additionally:  58 yo F with 1 day history of substernal chest pressure, dyspnea, and palpitations brought on while shopping today.  Sx's present for past week but acutely worsened.  Diagnosed with a fib about 1 year ago after similar symptoms.  Brought to ED and found to be in A fib with RVR.  No syncope, dyspena, or CP at rest.  Admitted to FPTS  Exam: Gen:  AAF sitting on side of bed in NAD Heart:  IRr/irr.  Tachycardic to 110s Lungs:  Clear Abdomen:  Obese/NT Ext:  +2 edema BL  Imp/Plan: 1.  Dyspnea - secondary to atrial fibrillation - agree with anticoagulation - attempt at cardioversion per Cards inpt vs outpt  2.  Cardiac: - agree with diuresis and increasing beta blocker for better rate control - still tachycardic, plan increase beta blocker further.   - agree with Echo.  - Ruled out from MI standpoing  3.  Normocytic anemia:  - check iron studies  Alveda Reasons, MD 07/29/2013 9:04 AM

## 2013-07-29 NOTE — Anesthesia Postprocedure Evaluation (Signed)
  Anesthesia Post-op Note  Patient: Kimberly [name]  Procedure(s) Performed: Procedure(s): TRANSESOPHAGEAL ECHOCARDIOGRAM (TEE) (N/A) CARDIOVERSION (N/A)  Patient Location: PACU and Endoscopy Unit  Anesthesia Type:General  Level of Consciousness: awake, alert  and oriented  Airway and Oxygen Therapy: Patient Spontanous Breathing and Patient connected to nasal cannula oxygen  Post-op Pain: none  Post-op Assessment: Post-op Vital signs reviewed and Patient's Cardiovascular Status Stable  Post-op Vital Signs: Reviewed and stable  Complications: No apparent anesthesia complications

## 2013-07-29 NOTE — Progress Notes (Signed)
Pt. Seen and examined. Agree with the NP/PA-C note as written.  Heart failure is improved. She has diuresed about 3.5L net negative. Remains in a-fib with RVR, difficult rate control. HR has been into the 180's.  I agree with adding IV diltiazem. She has not had enough doses of Eliquis to be at steady state - would recommend IV heparin and will investigate TEE/Cardioversion today. She could then be transitioned back to Eliquis at discharge. She did eat breakfast at 0800. Keep NPO until later today.  Pixie Casino, MD, [facility] Attending Cardiologist [place]

## 2013-07-29 NOTE — CV Procedure (Signed)
   Transesophageal Echocardiogram Note  Kimberly [name] [id number] 1955/06/03  Procedure: Transesophageal Echocardiogram Indications: A trial fibrillation with RVR  Procedure Details Consent: Obtained Time Out: Verified patient identification, verified procedure, site/side was marked, verified correct patient position, special equipment/implants available, Radiology Safety Procedures followed,  medications/allergies/relevent history reviewed, required imaging and test results available.  Performed  Medications: Fentanyl: 75 mcg Versed: 5 mg Propofol by anesthesia staff  Left Ventrical:  Left ventricle is dilated with severe dysfunction LVEF 25-30%  Mitral Valve: Mild MR, no vegetation.   Aortic Valve: Normal, no AI  Tricuspid Valve: Moderate TR  Pulmonic Valve: Normal, no PR  Left Atrium/ Left atrial appendage: Left atrium is dilated. There is an echodensity in the most distal portion of the left atrial appendage highly suspicious for a thrombus. Filling and emptying velocities are decreased.   Atrial septum: There is no ASD. There is PFO by color Doppler.   Aorta: Mild plague.   Complications: Complications of difficult sedation with desaturation. The procedure was terminated prematurely.  Patient did not tolerate procedure well.  [name], MD, [facility] 07/29/2013, 3:37 PM

## 2013-07-29 NOTE — Progress Notes (Signed)
UR completed. Patient changed to inpatient- requiring IV lasix and IV cardizem gtt

## 2013-07-29 NOTE — Progress Notes (Signed)
Pt started on Cardizem gtt @ 41ml/hr at 1213 for a-fib with RVR, pt went for TEE/cardioversion unable to perform, pt will be transitioned to po Cardizem then started on heparin gtt, unable to get second IV started, vss, pt stable

## 2013-07-29 NOTE — Progress Notes (Signed)
FMTS Attending Daily Note:  Kimberly Sabal MD  [phone] pager  Family Practice pager:  [phone] I have seen and examined this patient and have reviewed their chart. I have discussed this patient with the resident. I agree with the resident's findings, assessment and care plan.  Additionally:  - Still tachycardic despite increase in beta blocker. - Cardiologist provided update during exam with patient -- plan is for TEE/attempt at cardioversion today at 2 pm. - Agree with Eliquis.    Kimberly Reasons, MD 07/29/2013 12:28 PM

## 2013-07-29 NOTE — H&P (View-Only) (Signed)
PHARMACY CONSULT NOTE  Pharmacy Consult :  Heparin Indication : atrial fibrillation w/RVR   Allergies: No Known Allergies  Heparin Dosing weight : 100 kg  Vital Signs: BP 110/60  Pulse 109  Temp(Src) 98 F (36.7 C) (Oral)  Resp 19  Ht 5\' 6"  (1.676 m)  Wt 355 lb 3.2 oz (161.118 kg)  BMI 57.36 kg/m2  SpO2 94%  Active Problems: Principal Problem:   Atrial fibrillation with rapid ventricular response Active Problems:   Chest pain   CHF (congestive heart failure)   Morbid obesity   NSVT (nonsustained ventricular tachycardia)   Diabetes mellitus type 2 in obese   Labs:  Recent Labs  07/28/13 0720 07/28/13 1515 07/29/13 0540  HGB 10.8* 10.3*  --   HCT 32.4* 31.3*  --   PLT 246 236  --   CREATININE 0.91 0.89 0.91   No results found for this basename: INR, PROTIME   Estimated Creatinine Clearance: 109 ml/min (by C-G formula based on Cr of 0.91).  Medical / Surgical History: Past Medical History  Diagnosis Date  . Atrial fibrillation   . Aortic stenosis   . Complication of anesthesia   . PONV (postoperative nausea and vomiting)   . Dysrhythmia     ATRIAL FIB WITH RVR  . Shortness of breath   . CHF (congestive heart failure)   . Arthritis     KNEES   Past Surgical History  Procedure Laterality Date  . Joint replacement      Current Medication[s] Include: Prior to admission: Prescriptions prior to admission  Medication Sig Dispense Refill  . acetaminophen (TYLENOL) 325 MG tablet Take 325 mg by mouth every 6 (six) hours as needed for moderate pain.      [name] aspirin EC 325 MG tablet Take 325 mg by mouth daily.      . Cholecalciferol (VITAMIN D-3) 5000 UNITS TABS Take 5,000 Units by mouth daily.      [name] GARLIC PO Take 2 tablets by mouth daily.      . metoprolol succinate (TOPROL-XL) 25 MG 24 hr tablet Take 25 mg by mouth daily.      . nitroGLYCERIN (NITROSTAT) 0.4 MG SL tablet Place 0.4 mg under the tongue every 5 (five) minutes as needed for chest pain.       . pravastatin (PRAVACHOL) 40 MG tablet Take 40 mg by mouth daily.      . Prenatal Vit-Iron Carbonyl-FA (PRENATAL PLUS IRON PO) Take 1 tablet by mouth daily.      [name] triamterene-hydrochlorothiazide (MAXZIDE-25) 37.5-25 MG per tablet Take 1 tablet by mouth daily.        Scheduled:  Scheduled:  . atorvastatin  40 mg Oral q1800  . cholecalciferol  5,000 Units Oral Daily  . furosemide  40 mg Intravenous TID  . living well with diabetes book   Does not apply Once  . metoprolol succinate  50 mg Oral BID  . potassium chloride  40 mEq Oral Daily   Infusion[s]: Infusions:  . diltiazem (CARDIZEM) infusion     Antibiotic[s]: Anti-infectives   None     Assessment:  58 y.o.female with atrial fibrillation w/RVR who will receive Heparin bridging pending cardioversion.  Patient will likely receive Apixaban post-DCCV.  She has received Apixaban 1 dose > 12 hours prior to initiation of Heparin.  Goal of Therapy:  Heparin goal is Heparin level 0.3-0.7 units/ml.      Plan:  1.  Heparin bolus 4000 units IV now, then  2.  Begin Heparin infusion at 1500 units/hr 3.  Next Heparin level @ 1800 pm 4.  Daily heparin level and CBC.  Monitor for bleeding complications.    Lorel Lembo, Craig Guess,  Pharm.D.. 07/29/2013,  11:19 AM

## 2013-07-30 DIAGNOSIS — E669 Obesity, unspecified: Secondary | ICD-10-CM

## 2013-07-30 DIAGNOSIS — I5041 Acute combined systolic (congestive) and diastolic (congestive) heart failure: Secondary | ICD-10-CM

## 2013-07-30 DIAGNOSIS — E119 Type 2 diabetes mellitus without complications: Secondary | ICD-10-CM

## 2013-07-30 LAB — CBC
HCT: 34.4 % — ABNORMAL LOW (ref 36.0–46.0)
Hemoglobin: 11.5 g/dL — ABNORMAL LOW (ref 12.0–15.0)
MCH: 29 pg (ref 26.0–34.0)
MCHC: 33.4 g/dL (ref 30.0–36.0)
MCV: 86.6 fL (ref 78.0–100.0)
Platelets: 285 10*3/uL (ref 150–400)
RBC: 3.97 MIL/uL (ref 3.87–5.11)
RDW: 14.7 % (ref 11.5–15.5)
WBC: 7.8 10*3/uL (ref 4.0–10.5)

## 2013-07-30 LAB — APTT
aPTT: 32 seconds (ref 24–37)
aPTT: 62 seconds — ABNORMAL HIGH (ref 24–37)
aPTT: 90 seconds — ABNORMAL HIGH (ref 24–37)

## 2013-07-30 LAB — HEPARIN LEVEL (UNFRACTIONATED)
Heparin Unfractionated: 0.39 IU/mL (ref 0.30–0.70)
Heparin Unfractionated: 0.74 IU/mL — ABNORMAL HIGH (ref 0.30–0.70)
Heparin Unfractionated: 1.01 IU/mL — ABNORMAL HIGH (ref 0.30–0.70)

## 2013-07-30 MED ORDER — DILTIAZEM HCL ER COATED BEADS 240 MG PO TB24
240.0000 mg | ORAL_TABLET | Freq: Every day | ORAL | Status: DC
  Administered 2013-07-30 – 2013-08-02 (×4): 240 mg via ORAL
  Filled 2013-07-30 (×3): qty 1
  Filled 2013-07-30: qty 2

## 2013-07-30 MED ORDER — HEPARIN (PORCINE) IN NACL 100-0.45 UNIT/ML-% IJ SOLN
2000.0000 [IU]/h | INTRAMUSCULAR | Status: DC
  Administered 2013-07-30 (×2): 2000 [IU]/h via INTRAVENOUS
  Filled 2013-07-30 (×4): qty 250

## 2013-07-30 MED ORDER — HEPARIN (PORCINE) IN NACL 100-0.45 UNIT/ML-% IJ SOLN
1600.0000 [IU]/h | INTRAMUSCULAR | Status: DC
  Administered 2013-07-30 – 2013-07-31 (×2): 1700 [IU]/h via INTRAVENOUS
  Administered 2013-08-01 (×2): 1600 [IU]/h via INTRAVENOUS
  Filled 2013-07-30 (×4): qty 250

## 2013-07-30 MED ORDER — FUROSEMIDE 40 MG PO TABS
40.0000 mg | ORAL_TABLET | Freq: Two times a day (BID) | ORAL | Status: DC
  Administered 2013-07-30 – 2013-08-02 (×6): 40 mg via ORAL
  Filled 2013-07-30 (×11): qty 1

## 2013-07-30 NOTE — Progress Notes (Signed)
FMTS Attending Daily Note: Dorcas Mcmurray MD [phone] pager office [phone] I  have seen and examined this patient, reviewed their chart. I have discussed this patient with the resident. I agree with the resident's findings, assessment and care plan. Cards plans cath Monday.

## 2013-07-30 NOTE — Progress Notes (Signed)
Earlier in shift at shift change, received report from day nurse that patient was to have both cardizem and heparin drips but has only one IV. Both IV medications are incompatible and therefore patient needed a second IV in order to have both drips infusing. Report from nurse also entailed that multiple attempts were performed between staff floor nurses and IV team nurses but were all unsuccessful. Day nurse notified doctor and did not want a Picc line inserted. Doctor gave orders to stop cardizem and start heparin drip and to only give cardizem medications as ordered by mouth at this time. Those orders were carried out and documented. Will continue to monitor patient to end of shift.

## 2013-07-30 NOTE — Progress Notes (Signed)
ANTICOAGULATION CONSULT NOTE - Follow Up Consult  Pharmacy Consult for heparin Indication: atrial fibrillation  Labs:  Recent Labs  07/28/13 0720 07/28/13 1515 07/28/13 2235 07/29/13 0540  07/30/13 0443 07/30/13 1247 07/30/13 2116  HGB 10.8* 10.3*  --   --   --  11.5*  --   --   HCT 32.4* 31.3*  --   --   --  34.4*  --   --   PLT 246 236  --   --   --  285  --   --   APTT  --   --   --   --   < > 32 62* 90*  HEPARINUNFRC  --   --   --   --   < > 0.39 0.74* 1.01*  CREATININE 0.91 0.89  --  0.91  --   --   --   --   TROPONINI  --  <0.30 <0.30 <0.30  --   --   --   --   < > = values in this interval not displayed.  Assessment: 58yo female on apixiban PTA last dose 3/12, aptt and HL continues to rise, at this point believe that HL is accurate. No bleeding noted per RN, no IV issues noted. Rn instructed to hold heparin for ~20 then restart at 1700/hr. Will check level in am.  Goal of Therapy:  Heparin level 0.3-0.7 units/ml aPTT 66-102 seconds   Plan:  Decrease heparin to 1700 units/hr HL in am  Erin Hearing PharmD., BCPS Clinical Pharmacist Pager [phone] 07/30/2013 10:12 PM

## 2013-07-30 NOTE — Progress Notes (Signed)
Family Medicine Teaching Service Daily Progress Note Intern Pager: [phone]  Patient name: Kimberly [name] Medical record number: [id number] Date of birth: 1955-12-28 Age: 58 y.o. Gender: female  Primary Care Provider: Omer Jack, MD Consultants: cards Code Status: full  Pt Overview and Major Events to Date:  3/12 - admitted with dyspnea 3/13 - TEE, possible atrial thrombus, no [place] performed   Assessment and Plan: Kimberly [name] is a 58 y.o. female presenting with 2 month progressive SOB and substernal chest "pulling." PMH is significant for atrial fibrillation, obesity, hyperlipidemia and knee replacement.   # Worsening Dyspnea - Resolved. HEART score 4 (HTN, age, risk factors).  - ddimer pos, CTA neg, trop neg x 3 - Cardiology consulting, appreciate recs - A1c 6.5, LDL 115, HDL 46  - apixaban started 3/12  # Atrial Fibrillation - mild RVR on admission to 120s-130s - cards consulting, appreciate recs.  TEE showing possible LA thrombus, delayed DCC x 3 months? - metoprolol increased to 50 BID.  Pt started on PO cardizem yesterday, slowly titrate Cardizem GTT off today   # Hyperlipidemia - On low-intensity statin at home.  - continue lipitor 40mg  daily - LDL 115, HDL 46   # Hypertension - Stable.  - Monitor on home metoprolol XL and triamterene-HCTZ 37.5-25mg  daily.   # H/o anemia - Hgb 10.8. No previous values for comparison.  - If systolic CHF, would add iron PO.   # New DM: A1c 6.5 - discussed diet and activity changes vs. starting metformin - patient opts for trial of lifestyle modifications - dm educator consulted  FEN/GI: heart healthy, SLIV  Prophylaxis: SQ heparin  Disposition: home pending clinical improvement, rate control  Subjective: Denies SOB, CP, N/V/D  Objective: Temp:  [97.4 F (36.3 C)-98.2 F (36.8 C)] 97.4 F (36.3 C) (03/14 0637) Pulse Rate:  [39-118] 76 (03/13 2100) Resp:  [18-26] 18 (03/14 0637) BP: (106-156)/(45-125) 111/45 mmHg (03/14  0637) SpO2:  [91 %-100 %] 94 % (03/14 0637) Weight:  [351 lb 6.4 oz (159.394 kg)] 351 lb 6.4 oz (159.394 kg) (03/14 9211) Physical Exam: General: NAD, morbidly obese  HEENT: [place]/AT,  MMM Neck: obese, no observable JVD Cardiovascular: distant heart sounds, 2+ bilateral radial and DP pulses, irregularly irregular  Respiratory: distant lung sounds, clear bilaterally throughout upper and lower lobes  Abdomen: obese, nontender, nondistended  Extremities: 1+ pitting edema to mid-shin  Neuro: intact grossly, normal speech, awake and alert  Laboratory:  Recent Labs Lab 07/28/13 0720 07/28/13 1515 07/30/13 0443  WBC 6.0 6.3 7.8  HGB 10.8* 10.3* 11.5*  HCT 32.4* 31.3* 34.4*  PLT 246 236 285    Recent Labs Lab 07/28/13 0720 07/28/13 1515 07/29/13 0540  NA 142  --  143  K 4.0  --  3.8  CL 103  --  101  CO2 25  --  28  BUN 14  --  14  CREATININE 0.91 0.89 0.91  CALCIUM 9.3  --  9.9  GLUCOSE 107*  --  126*   ProBNP: 1219  Troponin neg x3 A1c 6.5 D-dimer 1.31 TSH 1.712  Imaging/Diagnostic Tests: Chest x-ray: Heart borderline enlarged. Suspect underlying emphysematous change. No edema or consolidation.  EKG: atral fibrillation with RVR 111 and occasional PVCs, stable   CTA chest: No central pulmonary embolism/no peripheral PE  Kimberly Rod, DO 07/30/2013, 7:57 AM PGY-2, [place] Intern pager: [phone], text pages welcome

## 2013-07-30 NOTE — Progress Notes (Signed)
Alert and oriented.  Denies any pain or nausea.  No SOB noted.

## 2013-07-30 NOTE — Progress Notes (Signed)
ANTICOAGULATION CONSULT NOTE - Follow Up Consult  Pharmacy Consult for heparin Indication: atrial fibrillation  Labs:  Recent Labs  07/28/13 0720 07/28/13 1515 07/28/13 2235 07/29/13 0540 07/29/13 1927 07/30/13 0443  HGB 10.8* 10.3*  --   --   --  11.5*  HCT 32.4* 31.3*  --   --   --  34.4*  PLT 246 236  --   --   --  285  APTT  --   --   --   --  29 32  HEPARINUNFRC  --   --   --   --  0.46 0.39  CREATININE 0.91 0.89  --  0.91  --   --   TROPONINI  --  <0.30 <0.30 <0.30  --   --     Assessment: 58yo female with basically no change in aPTT since starting heparin last night, heparin level remains elevated d/t apixiban PTA.  Goal of Therapy:  Heparin level 0.3-0.7 units/ml aPTT 66-102 seconds   Plan:  Will increase heparin gtt by 4 units/kgABW/hr to 2000 units/hr and check level/PTT in [place], PharmD, BCPS  07/30/2013,5:47 AM

## 2013-07-30 NOTE — Progress Notes (Signed)
DAILY PROGRESS NOTE  Subjective:  Events noted at TEE yesterday with desaturation - suspect OSA. Unfortunately, she appears to have LA thrombus.  Could not cardiovert. Rate control is better today on cardizem gtt and po diltiazem as well. She is on heparin for anticoagulation. EF is 25-30%, suspect tachycardia mediated, however ischemic causes have not been excluded. Breathing has improved.   Objective:  Temp:  [97.4 F (36.3 C)-98.2 F (36.8 C)] 97.4 F (36.3 C) (03/14 0637) Pulse Rate:  [39-118] 76 (03/13 2100) Resp:  [18-26] 18 (03/14 0637) BP: (106-156)/(45-125) 111/45 mmHg (03/14 0637) SpO2:  [91 %-100 %] 94 % (03/14 0637) Weight:  [351 lb 6.4 oz (159.394 kg)] 351 lb 6.4 oz (159.394 kg) (03/14 7619) Weight change: -20 lb 1.6 oz (-9.117 kg)  Intake/Output from previous day: 03/13 0701 - 03/14 0700 In: 675 [P.O.:600; I.V.:75] Out: -   Intake/Output from this shift:    Medications: Current Facility-Administered Medications  Medication Dose Route Frequency Provider Last Rate Last Dose  . acetaminophen (TYLENOL) tablet 325 mg  325 mg Oral Q6H PRN Hilton Sinclair, MD   325 mg at 07/29/13 2306  . atorvastatin (LIPITOR) tablet 40 mg  40 mg Oral q1800 Hilton Sinclair, MD   40 mg at 07/29/13 1724  . cholecalciferol (VITAMIN D) tablet 5,000 Units  5,000 Units Oral Daily Alveda Reasons, MD   5,000 Units at 07/29/13 1140  . diltiazem (CARDIZEM) 100 mg in dextrose 5 % 100 mL infusion  5 mg/hr Intravenous Titrated Tarri Fuller, PA-C 5 mL/hr at 07/29/13 1213 5 mg/hr at 07/29/13 1213  . diltiazem (CARDIZEM) tablet 60 mg  60 mg Oral 3 times per day Tarri Fuller, PA-C   60 mg at 07/30/13 0631  . furosemide (LASIX) injection 40 mg  40 mg Intravenous TID Brittainy Simmons, PA-C   40 mg at 07/29/13 2301  . heparin ADULT infusion 100 units/mL (25000 units/250 mL)  2,000 Units/hr Intravenous Continuous Rogue Bussing, RPH 20 mL/hr at 07/30/13 0631 2,000 Units/hr at 07/30/13 0631   . metoprolol succinate (TOPROL-XL) 24 hr tablet 50 mg  50 mg Oral BID Brittainy Simmons, PA-C   50 mg at 07/29/13 2301  . nitroGLYCERIN (NITROSTAT) SL tablet 0.4 mg  0.4 mg Sublingual Q5 min PRN Hilton Sinclair, MD      . potassium chloride SA (K-DUR,KLOR-CON) CR tablet 40 mEq  40 mEq Oral Daily Brittainy Simmons, PA-C   40 mEq at 07/29/13 1145    Physical Exam: General appearance: alert and no distress Lungs: clear to auscultation bilaterally Heart: irregularly irregular rhythm Abdomen: soft, non-tender; bowel sounds normal; no masses,  no organomegaly and morbidly obese Extremities: edema 1+  Lab Results: Results for orders placed during the hospital encounter of 07/28/13 (from the past 48 hour(s))  I-STAT TROPOININ, ED     Status: None   Collection Time    07/28/13  9:34 AM      Result Value Ref Range   Troponin i, poc 0.00  0.00 - 0.08 ng/mL   Comment 3            Comment: Due to the release kinetics of cTnI,     a negative result within the first hours     of the onset of symptoms does not rule out     myocardial infarction with certainty.     If myocardial infarction is still suspected,     repeat the test at appropriate intervals.  TROPONIN I  Status: None   Collection Time    07/28/13  3:15 PM      Result Value Ref Range   Troponin I <0.30  <0.30 ng/mL   Comment:            Due to the release kinetics of cTnI,     a negative result within the first hours     of the onset of symptoms does not rule out     myocardial infarction with certainty.     If myocardial infarction is still suspected,     repeat the test at appropriate intervals.  CBC     Status: Abnormal   Collection Time    07/28/13  3:15 PM      Result Value Ref Range   WBC 6.3  4.0 - 10.5 K/uL   RBC 3.57 (*) 3.87 - 5.11 MIL/uL   Hemoglobin 10.3 (*) 12.0 - 15.0 g/dL   HCT 31.3 (*) 36.0 - 46.0 %   MCV 87.7  78.0 - 100.0 fL   MCH 28.9  26.0 - 34.0 pg   MCHC 32.9  30.0 - 36.0 g/dL   RDW 14.9   11.5 - 15.5 %   Platelets 236  150 - 400 K/uL  CREATININE, SERUM     Status: Abnormal   Collection Time    07/28/13  3:15 PM      Result Value Ref Range   Creatinine, Ser 0.89  0.50 - 1.10 mg/dL   GFR calc non Af Amer 71 (*) >90 mL/min   GFR calc Af Amer 82 (*) >90 mL/min   Comment: (NOTE)     The eGFR has been calculated using the CKD EPI equation.     This calculation has not been validated in all clinical situations.     eGFR's persistently <90 mL/min signify possible Chronic Kidney     Disease.  HEMOGLOBIN A1C     Status: Abnormal   Collection Time    07/28/13  3:15 PM      Result Value Ref Range   Hemoglobin A1C 6.5 (*) <5.7 %   Comment: (NOTE)                                                                               According to the ADA Clinical Practice Recommendations for 2011, when     HbA1c is used as a screening test:      >=6.5%   Diagnostic of Diabetes Mellitus               (if abnormal result is confirmed)     5.7-6.4%   Increased risk of developing Diabetes Mellitus     References:Diagnosis and Classification of Diabetes Mellitus,Diabetes     NGEX,5284,13(KGMWN 1):S62-S69 and Standards of Medical Care in             Diabetes - 2011,Diabetes Care,2011,34 (Suppl 1):S11-S61.   Mean Plasma Glucose 140 (*) <117 mg/dL   Comment: Performed at [facility]  TROPONIN I     Status: None   Collection Time    07/28/13 10:35 PM      Result Value Ref Range   Troponin I <0.30  <0.30 ng/mL  Comment:            Due to the release kinetics of cTnI,     a negative result within the first hours     of the onset of symptoms does not rule out     myocardial infarction with certainty.     If myocardial infarction is still suspected,     repeat the test at appropriate intervals.  TSH     Status: None   Collection Time    07/28/13 10:35 PM      Result Value Ref Range   TSH 1.712  0.350 - 4.500 uIU/mL   Comment: Performed at [facility]  D-DIMER,  QUANTITATIVE     Status: Abnormal   Collection Time    07/28/13 10:35 PM      Result Value Ref Range   D-Dimer, Quant 1.31 (*) 0.00 - 0.48 ug/mL-FEU   Comment:            AT THE INHOUSE ESTABLISHED CUTOFF     VALUE OF 0.48 ug/mL FEU,     THIS ASSAY HAS BEEN DOCUMENTED     IN THE LITERATURE TO HAVE     A SENSITIVITY AND NEGATIVE     PREDICTIVE VALUE OF AT LEAST     98 TO 99%.  THE TEST RESULT     SHOULD BE CORRELATED WITH     AN ASSESSMENT OF THE CLINICAL     PROBABILITY OF DVT / VTE.  TROPONIN I     Status: None   Collection Time    07/29/13  5:40 AM      Result Value Ref Range   Troponin I <0.30  <0.30 ng/mL   Comment:            Due to the release kinetics of cTnI,     a negative result within the first hours     of the onset of symptoms does not rule out     myocardial infarction with certainty.     If myocardial infarction is still suspected,     repeat the test at appropriate intervals.  BASIC METABOLIC PANEL     Status: Abnormal   Collection Time    07/29/13  5:40 AM      Result Value Ref Range   Sodium 143  137 - 147 mEq/L   Potassium 3.8  3.7 - 5.3 mEq/L   Chloride 101  96 - 112 mEq/L   CO2 28  19 - 32 mEq/L   Glucose, Bld 126 (*) 70 - 99 mg/dL   BUN 14  6 - 23 mg/dL   Creatinine, Ser 0.91  0.50 - 1.10 mg/dL   Calcium 9.9  8.4 - 10.5 mg/dL   GFR calc non Af Amer 69 (*) >90 mL/min   GFR calc Af Amer 80 (*) >90 mL/min   Comment: (NOTE)     The eGFR has been calculated using the CKD EPI equation.     This calculation has not been validated in all clinical situations.     eGFR's persistently <90 mL/min signify possible Chronic Kidney     Disease.  LIPID PANEL     Status: Abnormal   Collection Time    07/29/13  5:40 AM      Result Value Ref Range   Cholesterol 178  0 - 200 mg/dL   Triglycerides 85  <150 mg/dL   HDL 46  >39 mg/dL   Total CHOL/HDL Ratio 3.9  VLDL 17  0 - 40 mg/dL   LDL Cholesterol 115 (*) 0 - 99 mg/dL   Comment:            Total  Cholesterol/HDL:CHD Risk     Coronary Heart Disease Risk Table                         Men   Women      1/2 Average Risk   3.4   3.3      Average Risk       5.0   4.4      2 X Average Risk   9.6   7.1      3 X Average Risk  23.4   11.0                Use the calculated Patient Ratio     above and the CHD Risk Table     to determine the patient's CHD Risk.                ATP III CLASSIFICATION (LDL):      <100     mg/dL   Optimal      100-129  mg/dL   Near or Above                        Optimal      130-159  mg/dL   Borderline      160-189  mg/dL   High      >190     mg/dL   Very High  HEPARIN LEVEL (UNFRACTIONATED)     Status: None   Collection Time    07/29/13  7:27 PM      Result Value Ref Range   Heparin Unfractionated 0.46  0.30 - 0.70 IU/mL   Comment:            IF HEPARIN RESULTS ARE BELOW     EXPECTED VALUES, AND PATIENT     DOSAGE HAS BEEN CONFIRMED,     SUGGEST FOLLOW UP TESTING     OF ANTITHROMBIN III LEVELS.  APTT     Status: None   Collection Time    07/29/13  7:27 PM      Result Value Ref Range   aPTT 29  24 - 37 seconds  CBC     Status: Abnormal   Collection Time    07/30/13  4:43 AM      Result Value Ref Range   WBC 7.8  4.0 - 10.5 K/uL   RBC 3.97  3.87 - 5.11 MIL/uL   Hemoglobin 11.5 (*) 12.0 - 15.0 g/dL   HCT 34.4 (*) 36.0 - 46.0 %   MCV 86.6  78.0 - 100.0 fL   MCH 29.0  26.0 - 34.0 pg   MCHC 33.4  30.0 - 36.0 g/dL   RDW 14.7  11.5 - 15.5 %   Platelets 285  150 - 400 K/uL  HEPARIN LEVEL (UNFRACTIONATED)     Status: None   Collection Time    07/30/13  4:43 AM      Result Value Ref Range   Heparin Unfractionated 0.39  0.30 - 0.70 IU/mL   Comment:            IF HEPARIN RESULTS ARE BELOW     EXPECTED VALUES, AND PATIENT     DOSAGE HAS BEEN CONFIRMED,     SUGGEST FOLLOW UP TESTING  OF ANTITHROMBIN III LEVELS.  APTT     Status: None   Collection Time    07/30/13  4:43 AM      Result Value Ref Range   aPTT 32  24 - 37 seconds     Imaging: Dg Chest 2 View  07/28/2013   CLINICAL DATA:  Chest pain and difficulty breathing  EXAM: CHEST  2 VIEW  COMPARISON:  Chest radiograph Sep 26, 2003 available; images from that study not available.  FINDINGS: There is relative flattening hemidiaphragms suggesting underlying emphysematous change. There is no edema or consolidation. Heart is borderline enlarged with pulmonary vascularity within normal limits. No adenopathy. No bone lesions.  IMPRESSION: Heart borderline enlarged. Suspect underlying emphysematous change. No edema or consolidation.   Electronically Signed   By: Lowella Grip M.D.   On: 07/28/2013 11:35   Ct Angio Chest Pe W/cm &/or Wo Cm  07/29/2013   CLINICAL DATA:  Shortness of breath, history of CHF.  EXAM: CT ANGIOGRAPHY CHEST WITH CONTRAST  TECHNIQUE: Multidetector CT imaging of the chest was performed using the standard protocol during bolus administration of intravenous contrast. Multiplanar CT image reconstructions and MIPs were obtained to evaluate the vascular anatomy.  CONTRAST:  1100mL OMNIPAQUE IOHEXOL 350 MG/ML SOLN  COMPARISON:  07/28/2013 radiograph  FINDINGS: Suboptimal contrast bolus timing. Images are further degraded by respiratory motion.  Within these limitations, no central pulmonary arterial filling defect. The segmental and subsegmental branches are nondiagnostic.  Normal caliber aorta. Upper normal heart size. No pleural or pericardial effusion. No intrathoracic lymphadenopathy.  Upper abdominal images show hepatic steatosis.  Central airways are grossly patent. No confluent airspace opacity. Areas of mosaic attenuation may reflect air trapping.  No pneumothorax. Detailed lung parenchymal evaluation degraded by respiratory motion.  Multilevel degenerative changes.  No acute osseous finding.  Review of the MIP images confirms the above findings.  IMPRESSION: No central pulmonary embolism. The segmental and subsegmental branches are nondiagnostic due to  respiratory motion and contrast bolus timing.  Cardiomegaly and mild venous congestion. No overt edema or focal consolidation.  Mild areas of mosaic attenuation suggest air trapping/ small airways disease.   Electronically Signed   By: Carlos Levering M.D.   On: 07/29/2013 03:08    Assessment:  1. Principal Problem: 2.   Atrial fibrillation with rapid ventricular response 3. Active Problems: 4.   Chest pain 5.   CHF (congestive heart failure) 6.   Morbid obesity 7.   NSVT (nonsustained ventricular tachycardia) 8.   Diabetes mellitus type 2 in obese 9.   Plan:  1. Rate control is good today. Switch to cardizem LA 240 mg daily today. Continue IV heparin. She will need left heart catheterization to exclude coronary ischemia on Monday. Would wait to transition to a NOAC until discharge. I agree she will need 1-3 months of oral anticoagulation and a repeat TEE before considering cardioversion to see if LA Thrombus has resolved. Repeat TEE should be performed in the OR under general anesthesia, as I suspect she has undiagnosed OSA.  She will also need an outpatient sleep study. Change lasix to 40 mg po BID today. Blood pressure is now allowing the addition of ACE/ARB at this point - consider starting after cath and before d/c, which I suspect would be later Monday or Tuesday of next week.  Time Spent Directly with Patient:  15 minutes  Length of Stay:  LOS: 2 days   Pixie Casino, MD, [facility] Attending Cardiologist [place]  Lexis Potenza C 07/30/2013, 8:46 AM

## 2013-07-30 NOTE — Addendum Note (Signed)
Addendum created 07/30/13 1533 by Eligha Bridegroom, CRNA   Modules edited: Anesthesia Events

## 2013-07-31 DIAGNOSIS — R9389 Abnormal findings on diagnostic imaging of other specified body structures: Secondary | ICD-10-CM

## 2013-07-31 LAB — BASIC METABOLIC PANEL
BUN: 21 mg/dL (ref 6–23)
CO2: 27 mEq/L (ref 19–32)
Calcium: 9.4 mg/dL (ref 8.4–10.5)
Chloride: 98 mEq/L (ref 96–112)
Creatinine, Ser: 0.94 mg/dL (ref 0.50–1.10)
GFR calc Af Amer: 77 mL/min — ABNORMAL LOW (ref >90)
GFR calc non Af Amer: 67 mL/min — ABNORMAL LOW (ref >90)
Glucose, Bld: 113 mg/dL — ABNORMAL HIGH (ref 70–99)
Potassium: 4 mEq/L (ref 3.7–5.3)
Sodium: 140 mEq/L (ref 137–147)

## 2013-07-31 LAB — CBC
HCT: 34.2 % — ABNORMAL LOW (ref 36.0–46.0)
Hemoglobin: 11.3 g/dL — ABNORMAL LOW (ref 12.0–15.0)
MCH: 28.8 pg (ref 26.0–34.0)
MCHC: 33 g/dL (ref 30.0–36.0)
MCV: 87.2 fL (ref 78.0–100.0)
Platelets: 264 10*3/uL (ref 150–400)
RBC: 3.92 MIL/uL (ref 3.87–5.11)
RDW: 14.7 % (ref 11.5–15.5)
WBC: 6.6 10*3/uL (ref 4.0–10.5)

## 2013-07-31 LAB — HEPARIN LEVEL (UNFRACTIONATED)
Heparin Unfractionated: 0.58 IU/mL (ref 0.30–0.70)
Heparin Unfractionated: 0.64 IU/mL (ref 0.30–0.70)

## 2013-07-31 LAB — PRO B NATRIURETIC PEPTIDE: Pro B Natriuretic peptide (BNP): 521.8 pg/mL — ABNORMAL HIGH (ref 0–125)

## 2013-07-31 MED ORDER — SODIUM CHLORIDE 0.9 % IJ SOLN
3.0000 mL | Freq: Two times a day (BID) | INTRAMUSCULAR | Status: DC
  Administered 2013-07-31: 3 mL via INTRAVENOUS

## 2013-07-31 MED ORDER — SODIUM CHLORIDE 0.9 % IV SOLN: 250.0000 mL | INTRAVENOUS | Status: DC | PRN

## 2013-07-31 MED ORDER — SODIUM CHLORIDE 0.9 % IJ SOLN: 3.0000 mL | INTRAMUSCULAR | Status: DC | PRN

## 2013-07-31 MED ORDER — FERROUS SULFATE 325 (65 FE) MG PO TABS
325.0000 mg | ORAL_TABLET | Freq: Two times a day (BID) | ORAL | Status: DC
  Administered 2013-07-31 – 2013-08-03 (×5): 325 mg via ORAL
  Filled 2013-07-31 (×9): qty 1

## 2013-07-31 MED ORDER — ASPIRIN 81 MG PO CHEW
81.0000 mg | CHEWABLE_TABLET | ORAL | Status: AC
  Administered 2013-08-01: 81 mg via ORAL
  Filled 2013-07-31: qty 1

## 2013-07-31 NOTE — Progress Notes (Signed)
   DAILY PROGRESS NOTE  Subjective:  No events overnight. Diuresed another 1.1L. No chest pain. Breathing has improved. HR controlled overnight, but still in a-fib - now with more frequent PVC's.  Objective:  Temp:  [97.6 F (36.4 C)-97.8 F (36.6 C)] 97.6 F (36.4 C) (03/15 0500) Pulse Rate:  [66-87] 87 (03/15 0500) Resp:  [18] 18 (03/15 0500) BP: (114-130)/(47-84) 130/47 mmHg (03/15 0500) SpO2:  [95 %-99 %] 95 % (03/15 0500) Weight:  [355 lb 9.6 oz (161.3 kg)] 355 lb 9.6 oz (161.3 kg) (03/15 0500) Weight change: 4 lb 3.2 oz (1.906 kg)  Intake/Output from previous day: 03/14 0701 - 03/15 0700 In: 1675.3 [P.O.:1360; I.V.:315.3] Out: 2800 [Urine:2800]  Intake/Output from this shift: Total I/O In: 240 [P.O.:240] Out: -   Medications: Current Facility-Administered Medications  Medication Dose Route Frequency Provider Last Rate Last Dose  . acetaminophen (TYLENOL) tablet 325 mg  325 mg Oral Q6H PRN Maria T Thekkekandam, MD   325 mg at 07/29/13 2306  . atorvastatin (LIPITOR) tablet 40 mg  40 mg Oral q1800 Maria T Thekkekandam, MD   40 mg at 07/30/13 1853  . cholecalciferol (VITAMIN D) tablet 5,000 Units  5,000 Units Oral Daily Jeffrey H Walden, MD   5,000 Units at 07/30/13 1056  . diltiazem (CARDIZEM LA) 24 hr tablet 240 mg  240 mg Oral Daily Jeffrey H Walden, MD   240 mg at 07/30/13 1056  . ferrous sulfate tablet 325 mg  325 mg Oral BID WC Elena Adamo, MD      . furosemide (LASIX) tablet 40 mg  40 mg Oral BID Kenneth C. Hilty, MD   40 mg at 07/31/13 0748  . heparin ADULT infusion 100 units/mL (25000 units/250 mL)  1,700 Units/hr Intravenous Continuous Frank Rhea Wilson, RPH 17 mL/hr at 07/30/13 2246 1,700 Units/hr at 07/30/13 2246  . metoprolol succinate (TOPROL-XL) 24 hr tablet 50 mg  50 mg Oral BID Brittainy Simmons, PA-C   50 mg at 07/30/13 2133  . nitroGLYCERIN (NITROSTAT) SL tablet 0.4 mg  0.4 mg Sublingual Q5 min PRN Maria T Thekkekandam, MD      . potassium chloride SA  (K-DUR,KLOR-CON) CR tablet 40 mEq  40 mEq Oral Daily Brittainy Simmons, PA-C   40 mEq at 07/30/13 1057    Physical Exam: General appearance: alert and no distress Lungs: clear to auscultation bilaterally Heart: irregularly irregular rhythm Abdomen: soft, non-tender; bowel sounds normal; no masses,  no organomegaly and morbidly obese Extremities: edema 1+  Lab Results: Results for orders placed during the hospital encounter of 07/28/13 (from the past 48 hour(s))  HEPARIN LEVEL (UNFRACTIONATED)     Status: None   Collection Time    07/29/13  7:27 PM      Result Value Ref Range   Heparin Unfractionated 0.46  0.30 - 0.70 IU/mL   Comment:            IF HEPARIN RESULTS ARE BELOW     EXPECTED VALUES, AND PATIENT     DOSAGE HAS BEEN CONFIRMED,     SUGGEST FOLLOW UP TESTING     OF ANTITHROMBIN III LEVELS.  APTT     Status: None   Collection Time    07/29/13  7:27 PM      Result Value Ref Range   aPTT 29  24 - 37 seconds  CBC     Status: Abnormal   Collection Time    07/30/13  4:43 AM      Result   Value Ref Range   WBC 7.8  4.0 - 10.5 K/uL   RBC 3.97  3.87 - 5.11 MIL/uL   Hemoglobin 11.5 (*) 12.0 - 15.0 g/dL   HCT 34.4 (*) 36.0 - 46.0 %   MCV 86.6  78.0 - 100.0 fL   MCH 29.0  26.0 - 34.0 pg   MCHC 33.4  30.0 - 36.0 g/dL   RDW 14.7  11.5 - 15.5 %   Platelets 285  150 - 400 K/uL  HEPARIN LEVEL (UNFRACTIONATED)     Status: None   Collection Time    07/30/13  4:43 AM      Result Value Ref Range   Heparin Unfractionated 0.39  0.30 - 0.70 IU/mL   Comment:            IF HEPARIN RESULTS ARE BELOW     EXPECTED VALUES, AND PATIENT     DOSAGE HAS BEEN CONFIRMED,     SUGGEST FOLLOW UP TESTING     OF ANTITHROMBIN III LEVELS.  APTT     Status: None   Collection Time    07/30/13  4:43 AM      Result Value Ref Range   aPTT 32  24 - 37 seconds  HEPARIN LEVEL (UNFRACTIONATED)     Status: Abnormal   Collection Time    07/30/13 12:47 PM      Result Value Ref Range   Heparin  Unfractionated 0.74 (*) 0.30 - 0.70 IU/mL   Comment:            IF HEPARIN RESULTS ARE BELOW     EXPECTED VALUES, AND PATIENT     DOSAGE HAS BEEN CONFIRMED,     SUGGEST FOLLOW UP TESTING     OF ANTITHROMBIN III LEVELS.  APTT     Status: Abnormal   Collection Time    07/30/13 12:47 PM      Result Value Ref Range   aPTT 62 (*) 24 - 37 seconds   Comment:            IF BASELINE aPTT IS ELEVATED,     SUGGEST PATIENT RISK ASSESSMENT     BE USED TO DETERMINE APPROPRIATE     ANTICOAGULANT THERAPY.  HEPARIN LEVEL (UNFRACTIONATED)     Status: Abnormal   Collection Time    07/30/13  9:16 PM      Result Value Ref Range   Heparin Unfractionated 1.01 (*) 0.30 - 0.70 IU/mL   Comment:            IF HEPARIN RESULTS ARE BELOW     EXPECTED VALUES, AND PATIENT     DOSAGE HAS BEEN CONFIRMED,     SUGGEST FOLLOW UP TESTING     OF ANTITHROMBIN III LEVELS.  APTT     Status: Abnormal   Collection Time    07/30/13  9:16 PM      Result Value Ref Range   aPTT 90 (*) 24 - 37 seconds   Comment:            IF BASELINE aPTT IS ELEVATED,     SUGGEST PATIENT RISK ASSESSMENT     BE USED TO DETERMINE APPROPRIATE     ANTICOAGULANT THERAPY.  CBC     Status: Abnormal   Collection Time    07/31/13  4:40 AM      Result Value Ref Range   WBC 6.6  4.0 - 10.5 K/uL   RBC 3.92  3.87 - 5.11 MIL/uL   Hemoglobin   11.3 (*) 12.0 - 15.0 g/dL   HCT 34.2 (*) 36.0 - 46.0 %   MCV 87.2  78.0 - 100.0 fL   MCH 28.8  26.0 - 34.0 pg   MCHC 33.0  30.0 - 36.0 g/dL   RDW 14.7  11.5 - 15.5 %   Platelets 264  150 - 400 K/uL  HEPARIN LEVEL (UNFRACTIONATED)     Status: None   Collection Time    07/31/13  4:40 AM      Result Value Ref Range   Heparin Unfractionated 0.58  0.30 - 0.70 IU/mL   Comment:            IF HEPARIN RESULTS ARE BELOW     EXPECTED VALUES, AND PATIENT     DOSAGE HAS BEEN CONFIRMED,     SUGGEST FOLLOW UP TESTING     OF ANTITHROMBIN III LEVELS.  BASIC METABOLIC PANEL     Status: Abnormal   Collection Time      07/31/13  4:40 AM      Result Value Ref Range   Sodium 140  137 - 147 mEq/L   Potassium 4.0  3.7 - 5.3 mEq/L   Chloride 98  96 - 112 mEq/L   CO2 27  19 - 32 mEq/L   Glucose, Bld 113 (*) 70 - 99 mg/dL   BUN 21  6 - 23 mg/dL   Creatinine, Ser 0.94  0.50 - 1.10 mg/dL   Calcium 9.4  8.4 - 10.5 mg/dL   GFR calc non Af Amer 67 (*) >90 mL/min   GFR calc Af Amer 77 (*) >90 mL/min   Comment: (NOTE)     The eGFR has been calculated using the CKD EPI equation.     This calculation has not been validated in all clinical situations.     eGFR's persistently <90 mL/min signify possible Chronic Kidney     Disease.  PRO B NATRIURETIC PEPTIDE     Status: Abnormal   Collection Time    07/31/13  4:40 AM      Result Value Ref Range   Pro B Natriuretic peptide (BNP) 521.8 (*) 0 - 125 pg/mL    Imaging: No results found.  Assessment:  Principal Problem:   Atrial fibrillation with rapid ventricular response Active Problems:   Chest pain   Acute systolic congestive heart failure, NYHA class 4   Morbid obesity   NSVT (nonsustained ventricular tachycardia)   Diabetes mellitus type 2 in obese   Plan:  1. Rate control is good today. Seems to be tolerating cardizem LA. PVC's are new, ?ischemia. No chest pain.  Continue IV heparin. She will need left heart catheterization to exclude coronary ischemia on Monday. Would wait to transition to a NOAC until discharge. I agree she will need 1-3 months of oral anticoagulation and a repeat TEE before considering cardioversion to see if LA Thrombus has resolved. Repeat TEE should be performed in the OR under general anesthesia, as I suspect she has undiagnosed OSA.  She will also need an outpatient sleep study.  Would be careful about using sedation during cath. NPO p MN.  Time Spent Directly with Patient:  15 minutes  Length of Stay:  LOS: 3 days   Kenneth C. Hilty, MD, FACC Attending Cardiologist [facility]  HILTY,Kenneth C 07/31/2013, 9:27  AM     

## 2013-07-31 NOTE — Progress Notes (Signed)
FMTS Attending Daily Note: Dorcas Mcmurray MD [phone] pager office [phone] I  have seen and examined this patient, reviewed their chart. I have discussed this patient with the resident. I agree with the resident's findings, assessment and care plan. Ready for her cath in AM.

## 2013-07-31 NOTE — Progress Notes (Signed)
ANTICOAGULATION CONSULT NOTE - Follow Up Consult  Pharmacy Consult for heparin Indication: atrial fibrillation  Labs:  Recent Labs  07/28/13 0720 07/28/13 1515 07/28/13 2235 07/29/13 0540  07/30/13 0443 07/30/13 1247 07/30/13 2116 07/31/13 0440  HGB 10.8* 10.3*  --   --   --  11.5*  --   --  11.3*  HCT 32.4* 31.3*  --   --   --  34.4*  --   --  34.2*  PLT 246 236  --   --   --  285  --   --  264  APTT  --   --   --   --   < > 32 62* 90*  --   HEPARINUNFRC  --   --   --   --   < > 0.39 0.74* 1.01* 0.58  CREATININE 0.91 0.89  --  0.91  --   --   --   --   --   TROPONINI  --  <0.30 <0.30 <0.30  --   --   --   --   --   < > = values in this interval not displayed.   Assessment/Plan:  58yo female now therapeutic on heparin after rate decrease.  Will continue gtt at current rate and confirm stable with additional level.  Wynona Neat, PharmD, BCPS  07/31/2013,5:56 AM

## 2013-07-31 NOTE — Plan of Care (Signed)
Problem: Phase I Progression Outcomes Goal: EF % per last Echo/documented,Core Reminder form on chart Outcome: Completed/Met Date Met:  07/31/13 25-30%(07-29-13)

## 2013-07-31 NOTE — Progress Notes (Signed)
Family Medicine Teaching Service Daily Progress Note Intern Pager: [phone]  Patient name: Kimberly [name] Medical record number: [id number] Date of birth: December 02, 1955 Age: 58 y.o. Gender: female  Primary Care Provider: Omer Jack, MD Consultants: cards Code Status: full  Pt Overview and Major Events to Date:  3/12 - admitted with dyspnea 3/13 - TEE, possible atrial thrombus, no Taylor performed   Assessment and Plan: Kimberly [name] is a 58 y.o. female presenting with 2 month progressive SOB and substernal chest "pulling." PMH is significant for atrial fibrillation, obesity, hyperlipidemia and knee replacement.   # CHF - EF 25-30% on 3/13 TEE  - Cardiology consulting, appreciate recs - A1c 6.5, LDL 115, HDL 46  - continue lasix 40 bid - continue metoprolol, ASA - add acei/arb after cath if bp allows - plan for cath on 3/16  # Atrial Fibrillation - mild RVR on admission to 120s-130s - cards consulting, appreciate recs.  - TEE showed possible LA thrombus, delayed DCC x 3 months - apixaban started 3/12, held for cath, restart on d/c  - dilt drip transitioned to 240 PO dilt yesterday, rate controlled in 70s-80s  # Hyperlipidemia - On low-intensity statin at home.  - continue lipitor 40mg  daily (switch on admission) - LDL 115, HDL 46   # Hypertension - Stable.  - Monitor on home metoprolol XL and triamterene-HCTZ 37.5-25mg  daily.   # H/o anemia - Hgb 10.8. No previous values for comparison.  - PO iron added for systolic CHF  # New DM: V4B 6.5 - discussed diet and activity changes vs. starting metformin - patient opts for trial of lifestyle modifications - dm educator consulted  # ?OSA - refer for outpatient sleep study on d/c  FEN/GI: heart healthy, SLIV  Prophylaxis: SQ heparin  Disposition: home pending clinical improvement, rate control  Subjective: NAEON, feeling well. Denies SOB, CP, N/V/D  Objective: Temp:  [97.6 F (36.4 C)-97.8 F (36.6 C)] 97.6 F (36.4  C) (03/15 0500) Pulse Rate:  [66-87] 87 (03/15 0500) Resp:  [18] 18 (03/15 0500) BP: (114-130)/(47-84) 130/47 mmHg (03/15 0500) SpO2:  [95 %-99 %] 95 % (03/15 0500) Weight:  [355 lb 9.6 oz (161.3 kg)] 355 lb 9.6 oz (161.3 kg) (03/15 0500) Physical Exam: General: NAD, morbidly obese  HEENT: [place]/AT,  MMM Neck: obese, no observable JVD Cardiovascular: distant heart sounds, irregularly irregular  Respiratory: distant lung sounds, clear bilaterally throughout upper and lower lobes  Abdomen: obese, nontender, nondistended  Extremities: trace LE edema L>R Neuro: intact grossly, normal speech, awake and alert  Laboratory:  Recent Labs Lab 07/28/13 1515 07/30/13 0443 07/31/13 0440  WBC 6.3 7.8 6.6  HGB 10.3* 11.5* 11.3*  HCT 31.3* 34.4* 34.2*  PLT 236 285 264    Recent Labs Lab 07/28/13 0720 07/28/13 1515 07/29/13 0540 07/31/13 0440  NA 142  --  143 140  K 4.0  --  3.8 4.0  CL 103  --  101 98  CO2 25  --  28 27  BUN 14  --  14 21  CREATININE 0.91 0.89 0.91 0.94  CALCIUM 9.3  --  9.9 9.4  GLUCOSE 107*  --  126* 113*   ProBNP: 1219 -> 521.8 Troponin neg x3 A1c 6.5 D-dimer 1.31 TSH 1.712  Imaging/Diagnostic Tests: Chest x-ray: Heart borderline enlarged. Suspect underlying emphysematous change. No edema or consolidation.  EKG: atral fibrillation with RVR 111 and occasional PVCs, stable   CTA chest: No central pulmonary embolism/no peripheral PE  Beverlyn Roux, MD 07/31/2013,  7:34 AM PGY-1, [place] Intern pager: [phone], text pages welcome

## 2013-07-31 NOTE — Progress Notes (Signed)
Pt had episode of a 7 beat run of v-tach, no s/s, MD called and notified, will continue to monitor, Thanks Arvella Nigh RN.

## 2013-07-31 NOTE — Progress Notes (Signed)
ANTICOAGULATION CONSULT NOTE - Follow Up Consult  Pharmacy Consult for heparin Indication: atrial fibrillation  Labs:  Recent Labs  07/28/13 1515 07/28/13 2235 07/29/13 0540  07/30/13 0443 07/30/13 1247 07/30/13 2116 07/31/13 0440 07/31/13 1140  HGB 10.3*  --   --   --  11.5*  --   --  11.3*  --   HCT 31.3*  --   --   --  34.4*  --   --  34.2*  --   PLT 236  --   --   --  285  --   --  264  --   APTT  --   --   --   < > 32 62* 90*  --   --   HEPARINUNFRC  --   --   --   < > 0.39 0.74* 1.01* 0.58 0.64  CREATININE 0.89  --  0.91  --   --   --   --  0.94  --   TROPONINI <0.30 <0.30 <0.30  --   --   --   --   --   --   < > = values in this interval not displayed.   Assessment/Plan:  27 yoF presents 3/12 with 2 mo progressive SOB and substernal chest "pulling." On admit, EKG revealed atrial fibrillation with mild RVR (dx a year ago), has been on ASA daily  Initially started apixaban but converted to heparin for pending cath 3/16.   Heparin level 3/15 0.64 now therapeutic x 2 on 1700 units/hr CBC stable, wnl, no bleeding noted.   Plan: Will continue heparin 1700 units/hr Daily CBC, HL  Tonjia Parillo B. Leitha Schuller, PharmD Clinical Pharmacist - Resident Phone: [phone] Pager: [phone] 07/31/2013 12:52 PM

## 2013-07-31 NOTE — Progress Notes (Signed)
See nursing assessments.

## 2013-08-01 ENCOUNTER — Encounter (HOSPITAL_COMMUNITY): Payer: Self-pay | Admitting: Cardiology

## 2013-08-01 ENCOUNTER — Encounter (HOSPITAL_COMMUNITY)
Admission: EM | Disposition: A | Discharge status: Home / Self Care | Payer: Medicare Other | Source: Home / Self Care | Attending: Family Medicine

## 2013-08-01 DIAGNOSIS — I429 Cardiomyopathy, unspecified: Secondary | ICD-10-CM | POA: Diagnosis present

## 2013-08-01 DIAGNOSIS — E785 Hyperlipidemia, unspecified: Secondary | ICD-10-CM | POA: Diagnosis present

## 2013-08-01 DIAGNOSIS — I513 Intracardiac thrombosis, not elsewhere classified: Secondary | ICD-10-CM | POA: Diagnosis present

## 2013-08-01 DIAGNOSIS — I2510 Atherosclerotic heart disease of native coronary artery without angina pectoris: Secondary | ICD-10-CM

## 2013-08-01 HISTORY — PX: LEFT HEART CATHETERIZATION WITH CORONARY ANGIOGRAM: SHX5451

## 2013-08-01 LAB — HEPARIN LEVEL (UNFRACTIONATED): Heparin Unfractionated: 0.77 IU/mL — ABNORMAL HIGH (ref 0.30–0.70)

## 2013-08-01 LAB — CBC
HCT: 33.8 % — ABNORMAL LOW (ref 36.0–46.0)
Hemoglobin: 11.2 g/dL — ABNORMAL LOW (ref 12.0–15.0)
MCH: 28.8 pg (ref 26.0–34.0)
MCHC: 33.1 g/dL (ref 30.0–36.0)
MCV: 86.9 fL (ref 78.0–100.0)
Platelets: 274 10*3/uL (ref 150–400)
RBC: 3.89 MIL/uL (ref 3.87–5.11)
RDW: 14.5 % (ref 11.5–15.5)
WBC: 7.1 10*3/uL (ref 4.0–10.5)

## 2013-08-01 LAB — PROTIME-INR
INR: 1.08 (ref 0.00–1.49)
Prothrombin Time: 13.8 seconds (ref 11.6–15.2)

## 2013-08-01 SURGERY — LEFT HEART CATHETERIZATION WITH CORONARY ANGIOGRAM
Anesthesia: LOCAL

## 2013-08-01 MED ORDER — FENTANYL CITRATE 0.05 MG/ML IJ SOLN
INTRAMUSCULAR | Status: AC
  Filled 2013-08-01: qty 2

## 2013-08-01 MED ORDER — HEPARIN (PORCINE) IN NACL 2-0.9 UNIT/ML-% IJ SOLN
INTRAMUSCULAR | Status: AC
  Filled 2013-08-01: qty 1500

## 2013-08-01 MED ORDER — LIDOCAINE HCL (PF) 1 % IJ SOLN
INTRAMUSCULAR | Status: AC
  Filled 2013-08-01: qty 30

## 2013-08-01 MED ORDER — LIVING BETTER WITH HEART FAILURE BOOK
Freq: Once | Status: AC
  Administered 2013-08-01: 23:00:00
  Filled 2013-08-01: qty 1

## 2013-08-01 MED ORDER — SODIUM CHLORIDE 0.9 % IJ SOLN: 3.0000 mL | INTRAMUSCULAR | Status: DC | PRN

## 2013-08-01 MED ORDER — SODIUM CHLORIDE 0.9 % IV SOLN
1.0000 mL/kg/h | INTRAVENOUS | Status: AC
  Administered 2013-08-01: 1 mL/kg/h via INTRAVENOUS

## 2013-08-01 MED ORDER — VERAPAMIL HCL 2.5 MG/ML IV SOLN
INTRAVENOUS | Status: AC
  Filled 2013-08-01: qty 2

## 2013-08-01 MED ORDER — APIXABAN 5 MG PO TABS
5.0000 mg | ORAL_TABLET | Freq: Two times a day (BID) | ORAL | Status: DC
  Administered 2013-08-01 – 2013-08-03 (×4): 5 mg via ORAL
  Filled 2013-08-01 (×6): qty 1

## 2013-08-01 MED ORDER — MIDAZOLAM HCL 2 MG/2ML IJ SOLN
INTRAMUSCULAR | Status: AC
  Filled 2013-08-01: qty 2

## 2013-08-01 MED ORDER — NITROGLYCERIN 0.2 MG/ML ON CALL CATH LAB
INTRAVENOUS | Status: AC
  Filled 2013-08-01: qty 1

## 2013-08-01 MED ORDER — SODIUM CHLORIDE 0.9 % IV SOLN: INTRAVENOUS | Status: DC

## 2013-08-01 MED ORDER — HEPARIN SODIUM (PORCINE) 1000 UNIT/ML IJ SOLN
INTRAMUSCULAR | Status: AC
  Filled 2013-08-01: qty 1

## 2013-08-01 MED ORDER — SODIUM CHLORIDE 0.9 % IJ SOLN: 3.0000 mL | Freq: Two times a day (BID) | INTRAMUSCULAR | Status: DC

## 2013-08-01 MED ORDER — SODIUM CHLORIDE 0.9 % IV SOLN: 250.0000 mL | INTRAVENOUS | Status: DC | PRN

## 2013-08-01 NOTE — Progress Notes (Signed)
ANTICOAGULATION CONSULT NOTE - Follow Up Consult  Pharmacy Consult for heparin Indication: atrial fibrillation  Labs:  Recent Labs  07/29/13 0540  07/30/13 0443 07/30/13 1247 07/30/13 2116 07/31/13 0440 07/31/13 1140 08/01/13 0405  HGB  --   < > 11.5*  --   --  11.3*  --  11.2*  HCT  --   --  34.4*  --   --  34.2*  --  33.8*  PLT  --   --  285  --   --  264  --  274  APTT  --   < > 32 62* 90*  --   --   --   HEPARINUNFRC  --   < > 0.39 0.74* 1.01* 0.58 0.64 0.77*  CREATININE 0.91  --   --   --   --  0.94  --   --   TROPONINI <0.30  --   --   --   --   --   --   --   < > = values in this interval not displayed.   Assessment: 58yo female now slightly supratherapeutic on heparin after two levels at goal though had been trending up.  Goal of Therapy:  Heparin level 0.3-0.7 units/ml   Plan:  Will decrease heparin gtt slightly to 1600 units/hr and check level in 6hr.  Wynona Neat, PharmD, BCPS  08/01/2013,5:20 AM

## 2013-08-01 NOTE — CV Procedure (Signed)
    Cardiac Catheterization Procedure Note  Name: Kimberly [name] MRN: [id number] DOB: 1955-06-14  Procedure: Left Heart Cath, Selective Coronary Angiography, LV angiography  Indication: Cardiomyopathy   Procedural Details: The right wrist was prepped, draped, and anesthetized with 1% lidocaine. Using the modified Seldinger technique, a 5 French sheath was introduced into the right radial artery. 3 mg of verapamil was administered through the sheath, weight-based unfractionated heparin was administered intravenously. Standard Judkins catheters were used for selective coronary angiography and left ventriculography. Catheter exchanges were performed over an exchange length guidewire. There were no immediate procedural complications. A TR band was used for radial hemostasis at the completion of the procedure.  The patient was transferred to the post catheterization recovery area for further monitoring.  Procedural Findings: Hemodynamics: AO 120/68 mean 89 LV 120/16  Coronary angiography: Coronary dominance: right  Left mainstem: arises from left cusp, no obstructive disease. Divides into LAD and LCx  Left anterior descending (LAD): Patent to the LV apex. The first and second diagonals are patent without obstruction. There is no significant stenosis throughout the LAD  Left circumflex (LCx): there is a large intermediate branch without significant stenosis. The OM branches are patent without significant disease.  Right coronary artery (RCA): the RCA is patent and dominant. The PDA and PLA branches are patent, but the PLA has an area of 50% long stenosis (small vessel in that region).  Left ventriculography: Left ventricular systolic function is moderately to severely decreased, with LVEF estimated at 35% (improved from initial echo)   Final Conclusions:   1. Mild nonobstructive CAD 2. Moderate-severe LV dysfunction  Recommendations: will start apixaban tonight for atrial fibrillation/LA  thrombus. Med Rx for mild CAD and cardiomyopathy.  [name] 08/01/2013, 3:13 PM

## 2013-08-01 NOTE — Progress Notes (Signed)
Pharmacy Consult - Apixaban  Starting apixaban for Afib CrCl stable  Plan: 1) Apixaban 5 mg po BID 2) Continue to follow  Thank you. Anette Guarneri, PharmD

## 2013-08-01 NOTE — Progress Notes (Signed)
FMTS Attending Note  I personally saw and evaluated the patient. The plan of care was discussed with the resident team. I agree with the assessment and plan as documented by the resident.   Patient to go for cardiac cath today to evaluate for ischemia.   Dossie Arbour MD

## 2013-08-01 NOTE — Progress Notes (Signed)
    Subjective:  No complaints overnight.  Objective:  Vital Signs in the last 24 hours: Temp:  [97.3 F (36.3 C)-98 F (36.7 C)] 97.8 F (36.6 C) (03/16 0558) Pulse Rate:  [70-99] 82 (03/16 0604) Resp:  [18-20] 18 (03/16 0558) BP: (96-136)/(49-71) 114/71 mmHg (03/16 0604) SpO2:  [94 %-100 %] 97 % (03/16 0604) Weight:  [356 lb 11.3 oz (161.8 kg)] 356 lb 11.3 oz (161.8 kg) (03/16 0604)  Intake/Output from previous day:  Intake/Output Summary (Last 24 hours) at 08/01/13 0902 Last data filed at 08/01/13 0700  Gross per 24 hour  Intake 1352.5 ml  Output   2276 ml  Net -923.5 ml    Physical Exam: General appearance: alert, cooperative and morbidly obese Lungs: clear to auscultation bilaterally Heart: irregularly irregular rhythm   Rate: 80s  Rhythm: atrial fibrillation and PVCs  Lab Results:  Recent Labs  07/31/13 0440 08/01/13 0405  WBC 6.6 7.1  HGB 11.3* 11.2*  PLT 264 274    Recent Labs  07/31/13 0440  NA 140  K 4.0  CL 98  CO2 27  GLUCOSE 113*  BUN 21  CREATININE 0.94   No results found for this basename: TROPONINI, CK, MB,  in the last 72 hours  Recent Labs  08/01/13 0405  INR 1.08    Imaging: Imaging results have been reviewed  Cardiac Studies:  Assessment/Plan:   Principal Problem:   Atrial fibrillation with rapid ventricular response Active Problems:   Acute systolic congestive heart failure, NYHA class 4   ACS (acute coronary syndrome)   Diabetes mellitus type 2 in obese   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  ? Life Vest at discharge  [place] [phone] 08/01/2013, 9:02 AM  Patient has not been experiencing any chest discomfort.  Dyspnea has improved following diuresis.  Renal function remains normal.  Patient awaiting left heart  cardiac catheterization today.  Telemetry shows atrial fibrillation with controlled ventricular response and frequent PVCs.  We'll await results of cardiac catheterization and left ventriculogram.  After cardiac catheterization she will be transitioned to NOAC.  She has a left atrial thrombus.

## 2013-08-01 NOTE — Interval H&P Note (Signed)
History and Physical Interval Note:  08/01/2013 2:29 PM  Kimberly [name]  has presented today for surgery, with the diagnosis of cp  The various methods of treatment have been discussed with the patient and family. After consideration of risks, benefits and other options for treatment, the patient has consented to  Procedure(s): LEFT HEART CATHETERIZATION WITH CORONARY ANGIOGRAM (N/A) as a surgical intervention .  The patient's history has been reviewed, patient examined, no change in status, stable for surgery.  I have reviewed the patient's chart and labs.  Questions were answered to the patient's satisfaction.    Cath Lab Visit (complete for each Cath Lab visit)  Clinical Evaluation Leading to the Procedure:   ACS: no  Non-ACS:    Anginal Classification: No Symptoms  Anti-ischemic medical therapy: Maximal Therapy (2 or more classes of medications)  Non-Invasive Test Results: No non-invasive testing performed  Prior CABG: No previous CABG       Kimberly [name]

## 2013-08-01 NOTE — H&P (View-Only) (Signed)
    Subjective:  No complaints overnight.  Objective:  Vital Signs in the last 24 hours: Temp:  [97.3 F (36.3 C)-98 F (36.7 C)] 97.8 F (36.6 C) (03/16 0558) Pulse Rate:  [70-99] 82 (03/16 0604) Resp:  [18-20] 18 (03/16 0558) BP: (96-136)/(49-71) 114/71 mmHg (03/16 0604) SpO2:  [94 %-100 %] 97 % (03/16 0604) Weight:  [356 lb 11.3 oz (161.8 kg)] 356 lb 11.3 oz (161.8 kg) (03/16 0604)  Intake/Output from previous day:  Intake/Output Summary (Last 24 hours) at 08/01/13 0902 Last data filed at 08/01/13 0700  Gross per 24 hour  Intake 1352.5 ml  Output   2276 ml  Net -923.5 ml    Physical Exam: General appearance: alert, cooperative and morbidly obese Lungs: clear to auscultation bilaterally Heart: irregularly irregular rhythm   Rate: 80s  Rhythm: atrial fibrillation and PVCs  Lab Results:  Recent Labs  07/31/13 0440 08/01/13 0405  WBC 6.6 7.1  HGB 11.3* 11.2*  PLT 264 274    Recent Labs  07/31/13 0440  NA 140  K 4.0  CL 98  CO2 27  GLUCOSE 113*  BUN 21  CREATININE 0.94   No results found for this basename: TROPONINI, CK, MB,  in the last 72 hours  Recent Labs  08/01/13 0405  INR 1.08    Imaging: Imaging results have been reviewed  Cardiac Studies:  Assessment/Plan:   Principal Problem:   Atrial fibrillation with rapid ventricular response Active Problems:   Acute systolic congestive heart failure, NYHA class 4   ACS (acute coronary syndrome)   Diabetes mellitus type 2 in obese   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  ? Life Vest at discharge  Luke Kilroy PA-C [place] [phone] 08/01/2013, 9:02 AM  Patient has not been experiencing any chest discomfort.  Dyspnea has improved following diuresis.  Renal function remains normal.  Patient awaiting left heart  cardiac catheterization today.  Telemetry shows atrial fibrillation with controlled ventricular response and frequent PVCs.  We'll await results of cardiac catheterization and left ventriculogram.  After cardiac catheterization she will be transitioned to NOAC.  She has a left atrial thrombus. 

## 2013-08-01 NOTE — Progress Notes (Signed)
Family Medicine Teaching Service Daily Progress Note Intern Pager: [phone]  Patient name: Kimberly [name] Medical record number: [id number] Date of birth: Oct 29, 1955 Age: 58 y.o. Gender: female  Primary Care Provider: Omer Jack, MD Consultants: cards Code Status: full  Pt Overview and Major Events to Date:  3/12 - admitted with dyspnea 3/13 - TEE, possible atrial thrombus, no [place] performed   Assessment and Plan: Kimberly [name] is a 58 y.o. female presenting with 2 month progressive SOB and substernal chest "pulling." PMH is significant for atrial fibrillation, obesity, hyperlipidemia and knee replacement.   # CHF - EF 25-30% on 3/13 TEE  - Cardiology consulting, appreciate recs - A1c 6.5, LDL 115, HDL 46  - continue lasix 40 bid - continue metoprolol, ASA - add acei/arb after cath if bp allows - few short runs of NSVT and many PVCs, concerning for possible ischemia - plan for cath today  # Atrial Fibrillation - mild RVR on admission to 120s-130s - cards consulting, appreciate recs.  - TEE showed possible LA thrombus, delayed [place] x 3 months - apixaban started 3/12, held for cath, restart on d/c  - 240 PO diltiazem, rate controlled in 70s-80s  # Hyperlipidemia - On low-intensity statin at home.  - continue lipitor 40mg  daily (switched on admission) - LDL 115, HDL 46   # Hypertension - Stable.  - Monitor on home metoprolol XL and triamterene-HCTZ 37.5-25mg  daily.   # H/o anemia - Hgb 10.8. No previous values for comparison.  - PO iron added for systolic CHF  # New DM: H3Z 6.5 - discussed diet and activity changes vs. starting metformin - patient opts for trial of lifestyle modifications - dm educator consulted  # ?OSA - refer for outpatient sleep study on d/c  FEN/GI: heart healthy, SLIV  Prophylaxis: SQ heparin  Disposition: home pending clinical improvement, rate control  Subjective: NAEON, feeling well.   Objective: Temp:  [97.3 F (36.3 C)-98 F (36.7  C)] 97.8 F (36.6 C) (03/16 0558) Pulse Rate:  [70-99] 82 (03/16 0604) Resp:  [18-20] 18 (03/16 0558) BP: (96-136)/(49-71) 114/71 mmHg (03/16 0604) SpO2:  [94 %-100 %] 97 % (03/16 0604) Weight:  [356 lb 11.3 oz (161.8 kg)] 356 lb 11.3 oz (161.8 kg) (03/16 0604) Physical Exam: General: NAD, morbidly obese  HEENT: [place]/AT,  MMM Neck: obese, no observable JVD Cardiovascular: distant heart sounds, irregularly irregular  Respiratory: distant lung sounds, clear bilaterally throughout upper and lower lobes  Abdomen: obese, nontender, nondistended  Extremities: trace LE edema L>R Neuro: intact grossly, normal speech, awake and alert  Laboratory:  Recent Labs Lab 07/30/13 0443 07/31/13 0440 08/01/13 0405  WBC 7.8 6.6 7.1  HGB 11.5* 11.3* 11.2*  HCT 34.4* 34.2* 33.8*  PLT 285 264 274    Recent Labs Lab 07/28/13 0720 07/28/13 1515 07/29/13 0540 07/31/13 0440  NA 142  --  143 140  K 4.0  --  3.8 4.0  CL 103  --  101 98  CO2 25  --  28 27  BUN 14  --  14 21  CREATININE 0.91 0.89 0.91 0.94  CALCIUM 9.3  --  9.9 9.4  GLUCOSE 107*  --  126* 113*   ProBNP: 1219 -> 521.8 Troponin neg x3 A1c 6.5 D-dimer 1.31 TSH 1.712  Imaging/Diagnostic Tests: Chest x-ray: Heart borderline enlarged. Suspect underlying emphysematous change. No edema or consolidation.  EKG: atral fibrillation with RVR 111 and occasional PVCs, stable   CTA chest: No central pulmonary embolism/no peripheral PE  Beverlyn Roux, MD 08/01/2013, 8:17 AM PGY-1, [place] Intern pager: [phone], text pages welcome

## 2013-08-01 NOTE — Progress Notes (Signed)
TR BAND REMOVAL  LOCATION:  right radial  DEFLATED PER PROTOCOL:  yes  TIME BAND OFF / DRESSING APPLIED:   1855   SITE UPON ARRIVAL:   Level 0  SITE AFTER BAND REMOVAL:  Level 0  REVERSE ALLEN'S TEST:    positive  CIRCULATION SENSATION AND MOVEMENT:  Within Normal Limits  yes  COMMENTS:

## 2013-08-01 NOTE — Addendum Note (Signed)
Addendum created 08/01/13 1040 by Suzy Bouchard, CRNA   Modules edited: Anesthesia Responsible Staff

## 2013-08-02 DIAGNOSIS — I5189 Other ill-defined heart diseases: Secondary | ICD-10-CM

## 2013-08-02 LAB — BASIC METABOLIC PANEL
BUN: 14 mg/dL (ref 6–23)
CO2: 28 mEq/L (ref 19–32)
Calcium: 9.2 mg/dL (ref 8.4–10.5)
Chloride: 101 mEq/L (ref 96–112)
Creatinine, Ser: 0.94 mg/dL (ref 0.50–1.10)
GFR calc Af Amer: 77 mL/min — ABNORMAL LOW (ref >90)
GFR calc non Af Amer: 67 mL/min — ABNORMAL LOW (ref >90)
Glucose, Bld: 88 mg/dL (ref 70–99)
Potassium: 4.7 mEq/L (ref 3.7–5.3)
Sodium: 141 mEq/L (ref 137–147)

## 2013-08-02 MED ORDER — FUROSEMIDE 40 MG PO TABS
40.0000 mg | ORAL_TABLET | Freq: Every day | ORAL | Status: DC
  Administered 2013-08-03: 11:00:00 40 mg via ORAL
  Filled 2013-08-02: qty 1

## 2013-08-02 NOTE — Discharge Summary (Signed)
[facility] Discharge Summary  Patient name: Kimberly [name] Medical record number: [id number] Date of birth: 10/29/1955 Age: 58 y.o. Gender: female Date of Admission: 07/28/2013  Date of Discharge: 08/03/13 Admitting Physician: Alveda Reasons, MD  Primary Care Provider: Omer Jack, MD Consultants: cardiology  Indication for Hospitalization: dyspnea  Discharge Diagnoses/Problem List:  Patient Active Problem List   Diagnosis Date Noted  . Dyslipidemia 08/01/2013  . 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 Cardiomyopathy- etiology not yet determined- EF 25-30% 08/01/2013  . Morbid obesity- suspected sleep apnea 07/29/2013  . NSVT (nonsustained ventricular tachycardia) 07/29/2013  . Diabetes mellitus type 2 in obese 07/29/2013  . Chest pain 07/28/2013  . ACS (acute coronary syndrome) 07/28/2013  . Atrial fibrillation with rapid ventricular response 07/28/2013  . Acute systolic congestive heart failure, NYHA class 4 07/28/2013    Disposition: home  Discharge Condition: improved  Brief Hospital Course: Kimberly [name] is a 58 y.o. female presenting with 2 month progressive SOB and substernal chest "pulling." PMH is significant for atrial fibrillation, obesity, hyperlipidemia and knee replacement.   # Acute on chronic systolic CHF deemed nonischemic cardiomyopathy: Her CHF was likely related to atrial fibrillation. She improved with diuresis. Her EF was found to be 35% by cardiac catheterization. She was treated with metoprolol, ASA. ACE will be added outpatient when her BP allows.  # Atrial Fibrillation: Patient was found to be in RVR and the initial plan was for DC cardioversion following TEE but she was found to have a left atrial thrombus which precluded this. She was anticoagulated with eliquis and rate controlled with diltiazem. She remained stable and rate controlled.  # Hyperlipidemia: Pt was switched from low dose statin on admission to  Lipitor 40 for high intensity risk reduction. Her LDL was 115, and HDL was 46.    # New DM: A1c 6.5: The patient received diabetic education and discussed treatment options. She opted to try lifestyle modifications initially but was prepared for the possibility of pharmacotherapy in the near future.  Issues for Follow Up:  1. ?OSA: Schedule outpatient sleep study 2. DM: Monitor lifestyle modifications, will likely need metformin soon if not having significant weightloss and improvements in glucose. 3. Per Dr. Lysbeth Penner note 07/30/13, "repeat TEE should be performed in the OR under general anesthesia, as I suspect she has undiagnosed OSA" 4. Continue Lasix 40mg  daily, decrease KCl to 41meq daily 5. BP still a little too soft for ACEI but can consider low dose as outpatient  Significant Procedures: cardiac cath, no stents placed  Significant Labs and Imaging:   Recent Labs Lab 07/30/13 0443 07/31/13 0440 08/01/13 0405  WBC 7.8 6.6 7.1  HGB 11.5* 11.3* 11.2*  HCT 34.4* 34.2* 33.8*  PLT 285 264 274    Recent Labs Lab 07/31/13 0440 08/02/13 1300  NA 140 141  K 4.0 4.7  CL 98 101  CO2 27 28  GLUCOSE 113* 88  BUN 21 14  CREATININE 0.94 0.94  CALCIUM 9.4 9.2   ProBNP: 1219 -> 521.8  Troponin neg x3  A1c 6.5  D-dimer 1.31  TSH 1.712   Chest x-ray: Heart borderline enlarged. Suspect underlying emphysematous change. No edema or consolidation.   EKG: atral fibrillation with RVR 111 and occasional PVCs, stable   CTA chest: No central pulmonary embolism/no peripheral PE   Cath:  Left mainstem: arises from left cusp, no obstructive disease. Divides into LAD and LCx  Left anterior descending (LAD): Patent to the  LV apex. The first and second diagonals are patent without obstruction. There is no significant stenosis throughout the LAD.  Left circumflex (LCx): there is a large intermediate branch without significant stenosis. The OM branches are patent without significant disease.   Right coronary artery (RCA): the RCA is patent and dominant. The PDA and PLA branches are patent, but the PLA has an area of 50% long stenosis (small vessel in that region).  Left ventriculography: Left ventricular systolic function is moderately to severely decreased, with LVEF estimated at 35% (improved from initial echo)  Results/Tests Pending at Time of Discharge: none  Discharge Medications:    Medication List    STOP taking these medications       pravastatin 40 MG tablet  Commonly known as:  PRAVACHOL     triamterene-hydrochlorothiazide 37.5-25 MG per tablet  Commonly known as:  MAXZIDE-25      TAKE these medications       acetaminophen 325 MG tablet  Commonly known as:  TYLENOL  Take 325 mg by mouth every 6 (six) hours as needed for moderate pain.     apixaban 5 MG Tabs tablet  Commonly known as:  ELIQUIS  Take 1 tablet (5 mg total) by mouth 2 (two) times daily.     aspirin EC 325 MG tablet  Take 325 mg by mouth daily.     ferrous sulfate 325 (65 FE) MG tablet  Take 1 tablet (325 mg total) by mouth 2 (two) times daily with a meal.     furosemide 40 MG tablet  Commonly known as:  LASIX  Take 1 tablet (40 mg total) by mouth daily.     GARLIC PO  Take 2 tablets by mouth daily.     metoprolol succinate 50 MG 24 hr tablet  Commonly known as:  TOPROL-XL  Take 1 tablet (50 mg total) by mouth 2 (two) times daily. Take with or immediately following a meal.     nitroGLYCERIN 0.4 MG SL tablet  Commonly known as:  NITROSTAT  Place 0.4 mg under the tongue every 5 (five) minutes as needed for chest pain.     PRENATAL PLUS IRON PO  Take 1 tablet by mouth daily.     Vitamin D-3 5000 UNITS Tabs  Take 5,000 Units by mouth daily.        Discharge Instructions: Please refer to Patient Instructions section of EMR for full details.  Patient was counseled important signs and symptoms that should prompt return to medical care, changes in medications, dietary instructions,  activity restrictions, and follow up appointments.   Follow-Up Appointments: Follow-up Information   Follow up with Omer Jack, MD On 08/10/2013. (11:00)    Specialty:  Internal Medicine   Contact information:   [address] [place] [address] [phone]       Follow up with Truitt Merle, NP. [facility] - Friday 08/12/13 at 2pm)    Specialty:  Nurse Practitioner   Contact information:   [place] [place] [address] [phone]       Follow up with [facility]. (Education/Check-in Visit with the office pharmacist since you were started on a new blood thinner during this admission - 09/01/13 at 2pm )    Specialty:  Cardiology   Contact information:   [address] [place] [address] [phone]      Beverlyn Roux, MD 08/05/2013, 10:04 PM PGY-1, [place]

## 2013-08-02 NOTE — Discharge Instructions (Signed)
Heart Failure Heart failure means your heart has trouble pumping blood. This makes it hard for your body to work well. Heart failure is usually a long-term (chronic) condition. You must take good care of yourself and follow your doctor's treatment plan. HOME CARE  Take your heart medicine as told by your doctor.  Do not stop taking medicine unless your doctor tells you to.  Do not skip any dose of medicine.  Refill your medicines before they run out.  Take other medicines only as told by your doctor or pharmacist.  Stay active if told by your doctor. The elderly and people with severe heart failure should talk with a doctor about physical activity.  Eat heart healthy foods. Choose foods that are without trans fat and are low in saturated fat, cholesterol, and salt (sodium). This includes fresh or frozen fruits and vegetables, fish, lean meats, fat-free or low-fat dairy foods, whole grains, and high-fiber foods. Lentils and dried peas and beans (legumes) are also good choices.  Limit salt if told by your doctor.  Cook in a healthy way. Roast, grill, broil, bake, poach, steam, or stir-fry foods.  Limit fluids as told by your doctor.  Weigh yourself every morning. Do this after you pee (urinate) and before you eat breakfast. Write down your weight to give to your doctor.  Take your blood pressure and write it down if your doctor tell you to.  Ask your doctor how to check your pulse. Check your pulse as told.  Lose weight if told by your doctor.  Stop smoking or chewing tobacco. Do not use gum or patches that help you quit without your doctor's approval.  Schedule and go to doctor visits as told.  Nonpregnant women should have no more than 1 drink a day. Men should have no more than 2 drinks a day. Talk to your doctor about drinking alcohol.  Stop illegal drug use.  Stay current with shots (immunizations).  Manage your health conditions as told by your doctor.  Learn to manage  your stress.  Rest when you are tired.  If it is really hot outside:  Avoid intense activities.  Use air conditioning or fans, or get in a cooler place.  Avoid caffeine and alcohol.  Wear loose-fitting, lightweight, and light-colored clothing.  If it is really cold outside:  Avoid intense activities.  Layer your clothing.  Wear mittens or gloves, a hat, and a scarf when going outside.  Avoid alcohol.  Learn about heart failure and get support as needed.  Get help to maintain or improve your quality of life and your ability to care for yourself as needed. GET HELP IF:   You gain 03 lb/1.4 kg or more in 1 day or 05 lb/2.3 kg in a week.  You are more short of breath than usual.  You cannot do your normal activities.  You tire easily.  You cough more than normal, especially with activity.  You have any or more puffiness (swelling) in areas such as your hands, feet, ankles, or belly (abdomen).  You cannot sleep because it is hard to breathe.  You feel like your heart is beating fast (palpitations).  You get dizzy or lightheaded when you stand up. GET HELP RIGHT AWAY IF:   You have trouble breathing.  There is a change in mental status, such as becoming less alert or not being able to focus.  You have chest pain or discomfort.  You faint. MAKE SURE YOU:   Understand these  instructions.  Will watch your condition.  Will get help right away if you are not doing well or get worse. Document Released: 02/12/2008 Document Revised: 08/30/2012 Document Reviewed: 12/04/2011 [facility] Patient Information 2014 [place], [place].   Information on my medicine - ELIQUIS (apixaban)  This medication education was reviewed with me or my healthcare representative as part of my discharge preparation.  The pharmacist that spoke with me during my hospital stay was:    Why was Eliquis prescribed for you? Eliquis was prescribed for you to reduce the risk of forming blood  clots that can cause a stroke if you have a medical condition called atrial fibrillation (a type of irregular heartbeat) OR to reduce the risk of a blood clots forming after orthopedic surgery.  What do You need to know about Eliquis ? Take your Eliquis 5mg  TWICE DAILY - one tablet in the morning and one tablet in the evening with or without food.  It would be best to take the doses about the same time each day.  If you have difficulty swallowing the tablet whole please discuss with your pharmacist how to take the medication safely.  Take Eliquis exactly as prescribed by your doctor and DO NOT stop taking Eliquis without talking to the doctor who prescribed the medication.  Stopping may increase your risk of developing a new clot or stroke.  Refill your prescription before you run out.  After discharge, you should have regular check-up appointments with your healthcare provider that is prescribing your Eliquis.  In the future your dose may need to be changed if your kidney function or weight changes by a significant amount or as you get older.  What do you do if you miss a dose? If you miss a dose, take it as soon as you remember on the same day and resume taking twice daily.  Do not take more than one dose of ELIQUIS at the same time.  Important Safety Information A possible side effect of Eliquis is bleeding. You should call your healthcare provider right away if you experience any of the following:   Bleeding from an injury or your nose that does not stop.   Unusual colored urine (red or dark brown) or unusual colored stools (red or black).   Unusual bruising for unknown reasons.   A serious fall or if you hit your head (even if there is no bleeding).  Some medicines may interact with Eliquis and might increase your risk of bleeding or clotting while on Eliquis. To help avoid this, consult your healthcare provider or pharmacist prior to using any new prescription or non-prescription  medications, including herbals, vitamins, non-steroidal anti-inflammatory drugs (NSAIDs) and supplements.  This website has more information on Eliquis (apixaban): [email or web address].

## 2013-08-02 NOTE — Progress Notes (Signed)
Pharmacist Heart Failure Core Measure Documentation  Assessment: Lacora Folmer has an EF documented as 25-30% on 3/13 by Echo.  Rationale: Heart failure patients with left ventricular systolic dysfunction (LVSD) and an EF < 40% should be prescribed an angiotensin converting enzyme inhibitor (ACEI) or angiotensin receptor blocker (ARB) at discharge unless a contraindication is documented in the medical record.  This patient is not currently on an ACEI or ARB for HF.  This note is being placed in the record in order to provide documentation that a contraindication to the use of these agents is present for this encounter.  ACE Inhibitor or Angiotensin Receptor Blocker is contraindicated (specify all that apply)  []   ACEI allergy AND ARB allergy []   Angioedema []   Moderate or severe aortic stenosis []   Hyperkalemia [x]   Hypotension []   Renal artery stenosis []   Worsening renal function, preexisting renal disease or dysfunction   Manley Mason 08/02/2013 3:41 PM

## 2013-08-02 NOTE — Progress Notes (Signed)
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 No obstructive CAD and EF improved 35%. Chronic atrial fib. Rhythm this am reveals frequent PVCs.  CURRENT MEDS . apixaban  5 mg Oral BID  . atorvastatin  40 mg Oral q1800  . cholecalciferol  5,000 Units Oral Daily  . ferrous sulfate  325 mg Oral BID WC  . furosemide  40 mg Oral BID  . metoprolol succinate  50 mg Oral BID  . potassium chloride  40 mEq Oral Daily    OBJECTIVE  Filed Vitals:   08/02/13 0018 08/02/13 0656 08/02/13 0709 08/02/13 1144  BP: 120/52 90/52 96/52  93/46  Pulse: 97 91 83 82  Temp: 97.9 F (36.6 C) 97.4 F (36.3 C) 97.7 F (36.5 C) 98.1 F (36.7 C)  TempSrc: Oral Oral Oral Oral  Resp: 18 15 16 17   Height:      Weight: 358 lb 4 oz (162.5 kg)     SpO2: 96% 96% 97% 98%    Intake/Output Summary (Last 24 hours) at 08/02/13 1212 Last data filed at 08/02/13 1015  Gross per 24 hour  Intake 1485.85 ml  Output   2250 ml  Net -764.15 ml   Filed Weights   07/31/13 0500 08/01/13 0604 08/02/13 0018  Weight: 355 lb 9.6 oz (161.3 kg) 356 lb 11.3 oz (161.8 kg) 358 lb 4 oz (162.5 kg)    PHYSICAL EXAM  General: Pleasant, NAD. Neuro: Alert and oriented X 3. Moves all extremities spontaneously. Psych: Normal affect. HEENT:  Normal  Neck: Supple without bruits or JVD. Lungs:  Resp irregular and unlabored, CTA. Heart: RRR no s3, s4, or murmurs. Abdomen: Soft, non-tender, non-distended, BS + x 4.  Extremities: No clubbing, cyanosis or  edema. DP/PT/Radials 2+ and equal bilaterally.  Accessory Clinical Findings  CBC  Recent Labs  07/31/13 0440 08/01/13 0405  WBC 6.6 7.1  HGB 11.3* 11.2*  HCT 34.2* 33.8*  MCV 87.2 86.9  PLT 264 902   Basic Metabolic Panel  Recent Labs  07/31/13 0440  NA 140  K 4.0  CL 98  CO2 27  GLUCOSE 113*  BUN 21  CREATININE 0.94  CALCIUM 9.4   Liver Function Tests No results found for this basename: AST, ALT, ALKPHOS, BILITOT, PROT, ALBUMIN,  in the last 72 hours No results found for this basename: LIPASE, AMYLASE,  in the last 72 hours Cardiac Enzymes No results found for this basename: CKTOTAL, CKMB, CKMBINDEX, TROPONINI,  in the last 72 hours BNP No components found with this basename: POCBNP,  D-Dimer No results found for this basename: DDIMER,  in the last 72 hours Hemoglobin A1C No results found for this basename: HGBA1C,  in the last 72 hours Fasting Lipid Panel No results found for this basename: CHOL, HDL, LDLCALC, TRIG, CHOLHDL, LDLDIRECT,  in the last 72 hours Thyroid Function Tests No results found for this basename: TSH, T4TOTAL, FREET3, T3FREE, THYROIDAB,  in the last 72 hours  TELE  Atrial fibrillation with controlled ventricular response but tachy  with activity.  ECG   Radiology/Studies  Dg Chest 2 View  07/28/2013   CLINICAL DATA:  Chest pain and difficulty breathing  EXAM: CHEST  2 VIEW  COMPARISON:  Chest radiograph Sep 26, 2003 available; images from that study not available.  FINDINGS: There is relative flattening hemidiaphragms suggesting underlying emphysematous change. There is no edema or consolidation. Heart is borderline enlarged with pulmonary vascularity within normal limits. No adenopathy. No bone lesions.  IMPRESSION: Heart borderline enlarged. Suspect underlying emphysematous change. No edema or consolidation.   Electronically Signed   By: Lowella Grip M.D.   On: 07/28/2013 11:35   Ct Angio Chest Pe W/cm &/or Wo Cm  07/29/2013    CLINICAL DATA:  Shortness of breath, history of CHF.  EXAM: CT ANGIOGRAPHY CHEST WITH CONTRAST  TECHNIQUE: Multidetector CT imaging of the chest was performed using the standard protocol during bolus administration of intravenous contrast. Multiplanar CT image reconstructions and MIPs were obtained to evaluate the vascular anatomy.  CONTRAST:  151mL OMNIPAQUE IOHEXOL 350 MG/ML SOLN  COMPARISON:  07/28/2013 radiograph  FINDINGS: Suboptimal contrast bolus timing. Images are further degraded by respiratory motion.  Within these limitations, no central pulmonary arterial filling defect. The segmental and subsegmental branches are nondiagnostic.  Normal caliber aorta. Upper normal heart size. No pleural or pericardial effusion. No intrathoracic lymphadenopathy.  Upper abdominal images show hepatic steatosis.  Central airways are grossly patent. No confluent airspace opacity. Areas of mosaic attenuation may reflect air trapping.  No pneumothorax. Detailed lung parenchymal evaluation degraded by respiratory motion.  Multilevel degenerative changes.  No acute osseous finding.  Review of the MIP images confirms the above findings.  IMPRESSION: No central pulmonary embolism. The segmental and subsegmental branches are nondiagnostic due to respiratory motion and contrast bolus timing.  Cardiomegaly and mild venous congestion. No overt edema or focal consolidation.  Mild areas of mosaic attenuation suggest air trapping/ small airways disease.   Electronically Signed   By: Carlos Levering M.D.   On: 07/29/2013 03:08    ASSESSMENT AND PLAN  1. Nonischemic cardiomyopathy. 2. Chronic atrial fibrillation with Left atrial thrombus. 3. Obesity.  With her low EF we will stop her diltiazem and use Toprol for her rate control and her hypertension. Has already received her diltiazem today. She has normal renal function. She will benefit from ACE/ARB once BP allows. Will reduce lasix. Would continue to monitor here in hospital  another day. Signed, Darlin Coco MD

## 2013-08-02 NOTE — Progress Notes (Signed)
Family Medicine Teaching Service Daily Progress Note Intern Pager: [phone]  Patient name: Kimberly [name] Medical record number: [id number] Date of birth: Mar 07, 1956 Age: 58 y.o. Gender: female  Primary Care Provider: Omer Jack, MD Consultants: cards Code Status: full  Pt Overview and Major Events to Date:  3/12 - admitted with dyspnea 3/13 - TEE, possible atrial thrombus, no [place] performed  3/16 - cath, non-obstructing CAD, no stents placed  Assessment and Plan: Kimberly [name] is a 58 y.o. female presenting with 2 month progressive SOB and substernal chest "pulling." PMH is significant for atrial fibrillation, obesity, hyperlipidemia and knee replacement.   # CHF - EF 25-30% on 3/13 TEE  - Cardiology consulting, appreciate recs - A1c 6.5, LDL 115, HDL 46  - continue lasix 40 bid - continue metoprolol, ASA - eliquis restarted after cath yesterday - add acei/arb outpt when bp allows - cath showed non-obstructing CAD and moderate to severe left ventricular dysfunction - consider decreasing lasix and/or dilt prior to d/c for preload in aortic stenosis and to decrease BP to allow ace/arb addition  # Atrial Fibrillation - mild RVR on admission to 120s-130s - cards consulting, appreciate recs.  - TEE showed possible LA thrombus, delayed DCC x 3 months - apixaban restarted post-cath  - 240 PO diltiazem, rate controlled in 70s-80s  # Hyperlipidemia - On low-intensity statin at home.  - continue lipitor 40mg  daily (switched on admission) - LDL 115, HDL 46   # Hypertension - Stable.  - Monitor on home metoprolol XL and triamterene-HCTZ 37.5-25mg  daily.   # H/o anemia - Hgb 10.8. No previous values for comparison.  - PO iron added for systolic CHF  # New DM: Z3G 6.5 - discussed diet and activity changes vs. starting metformin - patient opts for trial of lifestyle modifications - dm educator consulted  # ?OSA - refer for outpatient sleep study on d/c  FEN/GI: heart  healthy, SLIV  Prophylaxis: SQ heparin  Disposition: home pending clinical improvement, rate control  Subjective: NAEON, doing well after cath. Denies dizziness or lightheadedness with standing  Objective: Temp:  [97.4 F (36.3 C)-97.9 F (36.6 C)] 97.7 F (36.5 C) (03/17 0709) Pulse Rate:  [31-97] 83 (03/17 0709) Resp:  [15-18] 16 (03/17 0709) BP: (90-136)/(50-75) 96/52 mmHg (03/17 0709) SpO2:  [92 %-100 %] 97 % (03/17 0709) Weight:  [358 lb 4 oz (162.5 kg)] 358 lb 4 oz (162.5 kg) (03/17 0018) Physical Exam: General: NAD, morbidly obese  HEENT: Kimberly [name]/AT,  MMM Neck: no observable JVD Cardiovascular: distant heart sounds, irregularly irregular  Respiratory: distant lung sounds, clear bilaterally throughout upper and lower lobes  Abdomen: obese, nontender, nondistended  Extremities: trace LE edema L>R Neuro: intact grossly, normal speech, awake and alert  Laboratory:  Recent Labs Lab 07/30/13 0443 07/31/13 0440 08/01/13 0405  WBC 7.8 6.6 7.1  HGB 11.5* 11.3* 11.2*  HCT 34.4* 34.2* 33.8*  PLT 285 264 274    Recent Labs Lab 07/28/13 0720 07/28/13 1515 07/29/13 0540 07/31/13 0440  NA 142  --  143 140  K 4.0  --  3.8 4.0  CL 103  --  101 98  CO2 25  --  28 27  BUN 14  --  14 21  CREATININE 0.91 0.89 0.91 0.94  CALCIUM 9.3  --  9.9 9.4  GLUCOSE 107*  --  126* 113*   ProBNP: 1219 -> 521.8 Troponin neg x3 A1c 6.5 D-dimer 1.31 TSH 1.712  Imaging/Diagnostic Tests: Chest x-ray: Heart borderline enlarged. Suspect  underlying emphysematous change. No edema or consolidation.  EKG: atral fibrillation with RVR 111 and occasional PVCs, stable   CTA chest: No central pulmonary embolism/no peripheral PE  Cath:  Left mainstem: arises from left cusp, no obstructive disease. Divides into LAD and LCx  Left anterior descending (LAD): Patent to the LV apex. The first and second diagonals are patent without obstruction. There is no significant stenosis throughout the LAD.   Left circumflex (LCx): there is a large intermediate branch without significant stenosis. The OM branches are patent without significant disease.  Right coronary artery (RCA): the RCA is patent and dominant. The PDA and PLA branches are patent, but the PLA has an area of 50% long stenosis (small vessel in that region).  Left ventriculography: Left ventricular systolic function is moderately to severely decreased, with LVEF estimated at 35% (improved from initial echo)   Beverlyn Roux, MD 08/02/2013, 8:48 AM PGY-1, [place] Intern pager: [phone], text pages welcome

## 2013-08-02 NOTE — Progress Notes (Signed)
FMTS Attending Note  I personally saw and evaluated the patient. The plan of care was discussed with the resident team. I agree with the assessment and plan as documented by the resident.  In light of hypotension cardiology elected to discontinue Diltiazem. Will continue Metoprolol and decrease Lasix to once daily. Will monitor overnight. Anticipate discharge tomorrow.    Dossie Arbour MD

## 2013-08-03 MED ORDER — APIXABAN 5 MG PO TABS: 5.0000 mg | ORAL_TABLET | Freq: Two times a day (BID) | ORAL | Status: DC

## 2013-08-03 MED ORDER — METOPROLOL SUCCINATE ER 50 MG PO TB24: 50.0000 mg | ORAL_TABLET | Freq: Two times a day (BID) | ORAL | Status: DC

## 2013-08-03 MED ORDER — FUROSEMIDE 40 MG PO TABS: 40.0000 mg | ORAL_TABLET | Freq: Every day | ORAL | Status: DC

## 2013-08-03 MED ORDER — FERROUS SULFATE 325 (65 FE) MG PO TABS: 325.0000 mg | ORAL_TABLET | Freq: Two times a day (BID) | ORAL | Status: DC

## 2013-08-03 MED ORDER — POTASSIUM CHLORIDE CRYS ER 20 MEQ PO TBCR
20.0000 meq | EXTENDED_RELEASE_TABLET | Freq: Every day | ORAL | Status: DC
  Administered 2013-08-03: 20 meq via ORAL

## 2013-08-03 NOTE — Progress Notes (Signed)
FMTS Attending Note  I personally saw and evaluated the patient. The plan of care was discussed with the resident team. I agree with the assessment and plan as documented by the resident.   Blood pressure improved overnight off Diltiazem, home medications as outlined in resident note, patient stable from cardiology and FM standpoint for discharge home today.  Dossie Arbour MD

## 2013-08-03 NOTE — Progress Notes (Signed)
Patient: Kimberly [name] / Admit Date: 07/28/2013 / Date of Encounter: 08/03/2013, 10:02 AM  Subjective  Feels good. No complaints. No SOB. Up around room this AM without any sx.   Objective   Telemetry: AF freq PVCs, occasional triplets  Physical Exam: Blood pressure 129/88, pulse 80, temperature 97.8 F (36.6 C), temperature source Oral, resp. rate 18, height 5\' 6"  (1.676 m), weight 362 lb 10.5 oz (164.5 kg), SpO2 93.00%. General: Well developed, obese AAM in no acute distress Head: Normocephalic, atraumatic, sclera non-icteric, no xanthomas, nares are without discharge. Neck: JVP not elevated. Lungs: Clear bilaterally to auscultation without wheezes, rales, or rhonchi. Breathing is unlabored. Heart: Irregular, rate controlled, S1 S2 without murmurs, rubs, or gallops.  Abdomen: Soft, non-tender, non-distended with normoactive bowel sounds. No rebound/guarding. Extremities: No clubbing or cyanosis. No signifiant edema. Distal pedal pulses are 2+ and equal bilaterally. Radial cath site without complication, pulse in tact Neuro: Alert and oriented X 3. Moves all extremities spontaneously. Psych:  Responds to questions appropriately with a normal affect.   Intake/Output Summary (Last 24 hours) at 08/03/13 1002 Last data filed at 08/03/13 0600  Gross per 24 hour  Intake   1080 ml  Output   2100 ml  Net  -1020 ml    Inpatient Medications:  . apixaban  5 mg Oral BID  . atorvastatin  40 mg Oral q1800  . ferrous sulfate  325 mg Oral BID WC  . furosemide  40 mg Oral Daily  . metoprolol succinate  50 mg Oral BID  . potassium chloride  20 mEq Oral Daily   Infusions:  . sodium chloride      Labs:  Recent Labs  08/02/13 1300  NA 141  K 4.7  CL 101  CO2 28  GLUCOSE 88  BUN 14  CREATININE 0.94  CALCIUM 9.2   No results found for this basename: AST, ALT, ALKPHOS, BILITOT, PROT, ALBUMIN,  in the last 72 hours  Recent Labs  08/01/13 0405  WBC 7.1  HGB 11.2*  HCT 33.8*    MCV 86.9  PLT 274   No results found for this basename: CKTOTAL, CKMB, TROPONINI,  in the last 72 hours No components found with this basename: POCBNP,  No results found for this basename: HGBA1C,  in the last 72 hours   Radiology/Studies:  Dg Chest 2 View  07/28/2013   CLINICAL DATA:  Chest pain and difficulty breathing  EXAM: CHEST  2 VIEW  COMPARISON:  Chest radiograph Sep 26, 2003 available; images from that study not available.  FINDINGS: There is relative flattening hemidiaphragms suggesting underlying emphysematous change. There is no edema or consolidation. Heart is borderline enlarged with pulmonary vascularity within normal limits. No adenopathy. No bone lesions.  IMPRESSION: Heart borderline enlarged. Suspect underlying emphysematous change. No edema or consolidation.   Electronically Signed   By: Lowella Grip M.D.   On: 07/28/2013 11:35   Ct Angio Chest Pe W/cm &/or Wo Cm  07/29/2013   CLINICAL DATA:  Shortness of breath, history of CHF.  EXAM: CT ANGIOGRAPHY CHEST WITH CONTRAST  TECHNIQUE: Multidetector CT imaging of the chest was performed using the standard protocol during bolus administration of intravenous contrast. Multiplanar CT image reconstructions and MIPs were obtained to evaluate the vascular anatomy.  CONTRAST:  169mL OMNIPAQUE IOHEXOL 350 MG/ML SOLN  COMPARISON:  07/28/2013 radiograph  FINDINGS: Suboptimal contrast bolus timing. Images are further degraded by respiratory motion.  Within these limitations, no central pulmonary arterial filling defect. The  segmental and subsegmental branches are nondiagnostic.  Normal caliber aorta. Upper normal heart size. No pleural or pericardial effusion. No intrathoracic lymphadenopathy.  Upper abdominal images show hepatic steatosis.  Central airways are grossly patent. No confluent airspace opacity. Areas of mosaic attenuation may reflect air trapping.  No pneumothorax. Detailed lung parenchymal evaluation degraded by respiratory  motion.  Multilevel degenerative changes.  No acute osseous finding.  Review of the MIP images confirms the above findings.  IMPRESSION: No central pulmonary embolism. The segmental and subsegmental branches are nondiagnostic due to respiratory motion and contrast bolus timing.  Cardiomegaly and mild venous congestion. No overt edema or focal consolidation.  Mild areas of mosaic attenuation suggest air trapping/ small airways disease.   Electronically Signed   By: Carlos Levering M.D.   On: 07/29/2013 03:08     Assessment and Plan  1. 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 Atrial fibrillation, persistent, with RVR during this admission 3. LAA thrombus by TEE 07/29/13 4. HTN 5. Newly diagnosed diabetes mellitus 6. PVCs/NSVT 7. Suspected OSA, would recommend outpatient sleep study 8. Hypotension, improved 9. Super morbid obesity Body mass index is 58.56 kg/(m^2)., needs weight loss  Continue apixaban. She will need 1-3 months of oral anticoagulation with repeat TEE before considering cardioversion to see if LA Thrombus has resolved. Per Dr. Lysbeth Penner note 07/30/13, "repeat TEE should be performed in the OR under general anesthesia, as I suspect she has undiagnosed OSA" (had desat from original TEE).  Brief pauses ~2.5 sec overnight may be due to underlying OSA.Strongly recommend primary team arrange outpatient sleep study and possible nutrition referral for weight loss at discharge. Volume status appears improved and she continues to diurese on oral Lasix. Continue Lasix 40mg  daily, decrease KCl to 73meq daily. BP still a little too soft for ACEI but can consider low dose as outpatient. Continue BB for AF/NSVT/PVCs. HR may be slightly elevated with ambulation but overall improved. I advised her to obtain a home BP/HR monitor for periodic monitoring. Will arrange outpt f/u in our clinic and also  plug her into our blood thinner clinic for 4 week education visit (periodically monitor to make sure patients aren't having any complications on the NOACs).  Transition of care visit: Friday 08/12/13 at 2pm with Truitt Merle NP Blood thinner pharmacist visit: 09/01/13 at 2pm  MD to follow with final recs.  Signed, Melina Copa PA-C  Patient seen. Overall doing well. No chest pain. Dyspnea stable. She will need outpatient sleep study for OSA. Agree with above recommendations re home meds. Okay for discharge today from heart standpoint.

## 2013-08-03 NOTE — Progress Notes (Signed)
Family Medicine Teaching Service Daily Progress Note Intern Pager: [phone]  Patient name: Kimberly [name] Medical record number: [id number] Date of birth: [birth year] Age: 58 y.o. Gender: female  Primary Care Provider: Omer Jack, MD Consultants: cards Code Status: full  Pt Overview and Major Events to Date:  3/12 - admitted with dyspnea 3/13 - TEE, possible atrial thrombus, no [place] performed  3/16 - cath, non-obstructing CAD, no stents placed  Assessment and Plan: Kimberly [name] is a 58 y.o. female presenting with 2 month progressive SOB and substernal chest "pulling." PMH is significant for atrial fibrillation, obesity, hyperlipidemia and knee replacement.   # Acute on chronic systolic CHF deemed nonischemic Cardiomyopathy: EF 35% by cath  - Cardiology consulting, appreciate recs - continue lasix 40 bid - continue metoprolol, ASA - eliquis, will need 1-3 months of oral anticoagulation   - add acei/arb outpt when bp allows - decrease KCl to 20 mEq daily   # Atrial Fibrillation: rate controlled.  - TEE showed possible LA thrombus, delayed DCC x 3 months - apixaban restarted post-cath  - 240 PO diltiazem  #LAA thrombus by TEE (07/29/13): Per Dr. Lysbeth Penner note 07/30/13, "repeat TEE should be performed in the OR under general anesthesia, as I suspect she has undiagnosed OSA.   # Hyperlipidemia - On low-intensity statin at home.  - continue lipitor 40mg  daily - LDL 115, HDL 46   # Hypertension - Stable.  - Monitor on home metoprolol XL and triamterene-HCTZ 37.5-25mg  daily.   # H/o anemia - Hgb 10.8. No previous values for comparison.  - PO iron added for systolic CHF  # New DM: T2W 6.5:  - patient opts for trial of lifestyle modifications - dm educator consulted  # ?OSA - refer for outpatient sleep study on d/c  FEN/GI: heart healthy, SLIV  Prophylaxis: SQ heparin  Disposition: home pending clinical improvement, rate control  Subjective: patient feeling well this AM.  She has no complaints and is ready to go if everything checks out ok.   Objective: Temp:  [97.4 F (36.3 C)-98.1 F (36.7 C)] 97.8 F (36.6 C) (03/18 0827) Pulse Rate:  [75-98] 80 (03/18 0827) Resp:  [15-20] 18 (03/18 0827) BP: (93-142)/(46-88) 129/88 mmHg (03/18 0827) SpO2:  [93 %-98 %] 93 % (03/18 0827) Weight:  [362 lb 10.5 oz (164.5 kg)] 362 lb 10.5 oz (164.5 kg) (03/17 2351) Physical Exam: General: NAD, morbidly obese  HEENT: [place]/AT,  MMM Cardiovascular: distant heart sounds, irregularly irregular  Respiratory: distant lung sounds, clear bilaterally throughout upper and lower lobes  Abdomen: obese, nontender, nondistended  Extremities: trace LE edema L>R Neuro: intact grossly, normal speech, awake and alert  Laboratory:  Recent Labs Lab 07/30/13 0443 07/31/13 0440 08/01/13 0405  WBC 7.8 6.6 7.1  HGB 11.5* 11.3* 11.2*  HCT 34.4* 34.2* 33.8*  PLT 285 264 274    Recent Labs Lab 07/29/13 0540 07/31/13 0440 08/02/13 1300  NA 143 140 141  K 3.8 4.0 4.7  CL 101 98 101  CO2 28 27 28   BUN 14 21 14   CREATININE 0.91 0.94 0.94  CALCIUM 9.9 9.4 9.2  GLUCOSE 126* 113* 88   ProBNP: 1219 -> 521.8 Troponin neg x3 A1c 6.5 D-dimer 1.31 TSH 1.712  Imaging/Diagnostic Tests: Chest x-ray: Heart borderline enlarged. Suspect underlying emphysematous change. No edema or consolidation.  EKG: atral fibrillation with RVR 111 and occasional PVCs, stable   CTA chest: No central pulmonary embolism/no peripheral PE  Cath:  Left mainstem: arises from left cusp, no  obstructive disease. Divides into LAD and LCx  Left anterior descending (LAD): Patent to the LV apex. The first and second diagonals are patent without obstruction. There is no significant stenosis throughout the LAD.  Left circumflex (LCx): there is a large intermediate branch without significant stenosis. The OM branches are patent without significant disease.  Right coronary artery (RCA): the RCA is patent and  dominant. The PDA and PLA branches are patent, but the PLA has an area of 50% long stenosis (small vessel in that region).  Left ventriculography: Left ventricular systolic function is moderately to severely decreased, with LVEF estimated at 35% (improved from initial echo)   Rosemarie Ax, MD 08/03/2013, 8:48 AM PGY-1, [place] Intern pager: [phone], text pages welcome

## 2013-08-03 NOTE — Progress Notes (Signed)
At 03:12 pt had a 2.56 sec pause, and at 0446 pt had a 2.57 sec pause. Pt in no distress, asymptomatic, VS stable as charted, will continue to monitor patient.

## 2013-08-04 ENCOUNTER — Encounter (HOSPITAL_COMMUNITY): Payer: Self-pay | Admitting: Anesthesiology

## 2013-08-04 NOTE — Addendum Note (Signed)
Addendum created 08/04/13 1219 by Kate Sable, MD   Modules edited: Anesthesia Attestations, Anesthesia Review and Sign Navigator Section, Clinical Notes   Clinical Notes:  File: [id number]

## 2013-08-06 NOTE — Discharge Summary (Signed)
I agree with the discharge summary as documented.   Raymondo Garcialopez MD  

## 2013-08-08 NOTE — Addendum Note (Signed)
Addendum created 08/08/13 1316 by Kate Sable, MD   Modules edited: Anesthesia Responsible Staff

## 2013-08-12 ENCOUNTER — Telehealth: Payer: Self-pay | Admitting: *Deleted

## 2013-08-12 ENCOUNTER — Ambulatory Visit (INDEPENDENT_AMBULATORY_CARE_PROVIDER_SITE_OTHER): Payer: Medicare Other | Admitting: Nurse Practitioner

## 2013-08-12 ENCOUNTER — Encounter: Payer: Self-pay | Admitting: Nurse Practitioner

## 2013-08-12 VITALS — BP 100/70 | HR 98 | Ht 66.0 in | Wt 364.4 lb

## 2013-08-12 DIAGNOSIS — R0609 Other forms of dyspnea: Secondary | ICD-10-CM

## 2013-08-12 DIAGNOSIS — R0600 Dyspnea, unspecified: Secondary | ICD-10-CM

## 2013-08-12 DIAGNOSIS — I4891 Unspecified atrial fibrillation: Secondary | ICD-10-CM

## 2013-08-12 DIAGNOSIS — I200 Unstable angina: Secondary | ICD-10-CM

## 2013-08-12 DIAGNOSIS — I5021 Acute systolic (congestive) heart failure: Secondary | ICD-10-CM

## 2013-08-12 DIAGNOSIS — R0989 Other specified symptoms and signs involving the circulatory and respiratory systems: Secondary | ICD-10-CM

## 2013-08-12 LAB — BASIC METABOLIC PANEL
BUN: 14 mg/dL (ref 6–23)
CO2: 29 mEq/L (ref 19–32)
Calcium: 9.9 mg/dL (ref 8.4–10.5)
Chloride: 102 mEq/L (ref 96–112)
Creatinine, Ser: 1 mg/dL (ref 0.4–1.2)
GFR: 74.43 mL/min (ref >60.00)
Glucose, Bld: 89 mg/dL (ref 70–99)
Potassium: 4 mEq/L (ref 3.5–5.1)
Sodium: 138 mEq/L (ref 135–145)

## 2013-08-12 LAB — CBC
HCT: 35 % — ABNORMAL LOW (ref 36.0–46.0)
Hemoglobin: 11.3 g/dL — ABNORMAL LOW (ref 12.0–15.0)
MCHC: 32.3 g/dL (ref 30.0–36.0)
MCV: 89.1 fl (ref 78.0–100.0)
Platelets: 277 10*3/uL (ref 150.0–400.0)
RBC: 3.93 Mil/uL (ref 3.87–5.11)
RDW: 15.5 % — ABNORMAL HIGH (ref 11.5–14.6)
WBC: 5.5 10*3/uL (ref 4.5–10.5)

## 2013-08-12 LAB — BRAIN NATRIURETIC PEPTIDE: Pro B Natriuretic peptide (BNP): 156 pg/mL — ABNORMAL HIGH (ref 0.0–100.0)

## 2013-08-12 NOTE — Telephone Encounter (Signed)
S/w Naaman Plummer at Dr. Olean Ree office @ [phone]

## 2013-08-12 NOTE — Telephone Encounter (Signed)
For lab results pt had cmp/mag/lipid last week stated would be faxed over

## 2013-08-12 NOTE — Progress Notes (Signed)
Kimberly [name] Date of Birth: [birth year] Medical Record #[id number]  History of Present Illness: Kimberly [name] is seen back today for a post hospital visit. Seen for Dr. Aundra Dubin. She is a 58 year old female with treated HLD, AF, morbid obesity HLD and past knee replacement. She is followed medically by Dr. Kathlene Cote of [facility].  Most recently presented to the [facility] ER with worsening SOB and chest discomfort -  Seen by Dr. Aundra Dubin. She states that she was first diagnosed with atrial fibrillation a little over a year ago. She has not been on oral anticoagulation, but had been on daily ASA. She is 25 mg of Toprol-XL for rate control. She was referred to a cardiologist in [facility] when first diagnosed, but has not returned for follow-up since the initial visit. She was referred for cardioversion but TEE showed left atrial thrombus - was anticoagulated with Eliquis and rate was controlled with Diltiazem which was transitioned over to beta blocker due to her low EF. She has been maintained on aspirin as well.  Echo showed a reduction in her EF - down to 25 to 30%. Cath was 35%. She was diuresed and placed on beta blocker. BP too soft for ACE.   Comes back today. Here with her husband. She has done ok. Remains short of breath with activity. Some increase swelling in her legs. Sits with her legs down. Says she is restricting her salt - but ate at [facility] yesterday. No chest pain. Does weigh at home. Can check her BP at home. Saw her PCP on Wednesday - did have some labs - no CBC or BNP noted. Not dizzy or lightheaded. BP still soft. No bleeding/bruising.     Current Outpatient Prescriptions  Medication Sig Dispense Refill  . acetaminophen (TYLENOL) 325 MG tablet Take 325 mg by mouth every 6 (six) hours as needed for moderate pain.      [name] apixaban (ELIQUIS) 5 MG TABS tablet Take 1 tablet (5 mg total) by mouth 2 (two) times daily.  60 tablet  2  . aspirin EC 325 MG tablet Take 325 mg by mouth  daily.      . Cholecalciferol (VITAMIN D-3) 5000 UNITS TABS Take 5,000 Units by mouth daily.      . ferrous sulfate 325 (65 FE) MG tablet Take 1 tablet (325 mg total) by mouth 2 (two) times daily with a meal.  60 tablet  0  . furosemide (LASIX) 40 MG tablet Take 1 tablet (40 mg total) by mouth daily.  30 tablet  0  . GARLIC PO Take 2 tablets by mouth daily.      . metoprolol succinate (TOPROL-XL) 50 MG 24 hr tablet Take 1 tablet (50 mg total) by mouth 2 (two) times daily. Take with or immediately following a meal.  30 tablet  0  . nitroGLYCERIN (NITROSTAT) 0.4 MG SL tablet Place 0.4 mg under the tongue every 5 (five) minutes as needed for chest pain.      . Prenatal Vit-Iron Carbonyl-FA (PRENATAL PLUS IRON PO) Take 1 tablet by mouth daily.       No current facility-administered medications for this visit.    No Known Allergies  Past Medical History  Diagnosis Date  . Aortic stenosis   . Complication of anesthesia   . PONV (postoperative nausea and vomiting)   . Atrial fibrillation   . NICM (nonischemic cardiomyopathy)     EF 30%  . Arthritis     KNEES  .  Morbid obesity   . HLD (hyperlipidemia)   . Diabetes     Past Surgical History  Procedure Laterality Date  . Joint replacement    . Tee without cardioversion N/A 07/29/2013    Procedure: TRANSESOPHAGEAL ECHOCARDIOGRAM (TEE);  Surgeon: [name], MD;  Location: [place];  Service: Cardiovascular;  Laterality: N/A;  . Cardioversion N/A 07/29/2013    Procedure: CARDIOVERSION;  Surgeon: [name], MD;  Location: [facility] ENDOSCOPY;  Service: Cardiovascular;  Laterality: N/A;    History  Smoking status  . Never Smoker   Smokeless tobacco  . Never Used    History  Alcohol Use No    Family History  Problem Relation Age of Onset  . CVA Mother     Review of Systems: The review of systems is per the HPI.  All other systems were reviewed and are negative.  Physical Exam: BP 100/70  Pulse 98  Ht 5\' 6"   (1.676 m)  Wt 364 lb 6.4 oz (165.291 kg)  BMI 58.84 kg/m2  SpO2 97% Patient is alert and in no acute distress. She is morbidly obese. Skin is warm and dry. Color is normal.  HEENT is unremarkable. Normocephalic/atraumatic. PERRL. Sclera are nonicteric. Neck is supple. No masses. No JVD. Lungs are clear. Breath sounds distant. She is short of breath with walking here in the office. Cardiac exam shows an irregular rate and rhythm. Heart tones distant. Abdomen is obese but soft. Extremities are full with edema. Gait and ROM are intact. No gross neurologic deficits noted.  Wt Readings from Last 3 Encounters:  08/12/13 364 lb 6.4 oz (165.291 kg)  08/02/13 362 lb 10.5 oz (164.5 kg)  08/02/13 362 lb 10.5 oz (164.5 kg)     LABORATORY DATA: EKG today shows atrial fib with PVC - rate of 98  Lab Results  Component Value Date   WBC 7.1 08/01/2013   HGB 11.2* 08/01/2013   HCT 33.8* 08/01/2013   PLT 274 08/01/2013   GLUCOSE 88 08/02/2013   CHOL 178 07/29/2013   TRIG 85 07/29/2013   HDL 46 07/29/2013   LDLCALC 115* 07/29/2013   NA 141 08/02/2013   K 4.7 08/02/2013   CL 101 08/02/2013   CREATININE 0.94 08/02/2013   BUN 14 08/02/2013   CO2 28 08/02/2013   TSH 1.712 07/28/2013   INR 1.08 08/01/2013   HGBA1C 6.5* 07/28/2013   TEE Study Conclusions from March 2015  - Left ventricle: Systolic function was severely reduced. The estimated ejection fraction was in the range of 25% to 30%. Wall motion was normal; there were no regional wall motion abnormalities. - Mitral valve: Moderate regurgitation. - Left atrium: The atrium was dilated. No evidence of thrombus in the atrial cavity or appendage. - Right atrium: No evidence of thrombus in the atrial cavity or appendage. - Atrial septum: There was a patent foramen ovale. - Tricuspid valve: Moderate-severe regurgitation. - Pulmonary arteries: PA peak pressure: 60mm Hg (S). - Recommendations: This study was terminated early because of desaturation. 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 Long term anticoagulation and repeat TEE in 3 months is recommended. Recommendations: This study was terminated early because of desaturation.   Procedure: Left Heart Cath, Selective Coronary Angiography, LV angiography  Indication: Cardiomyopathy  Procedural Details: The right wrist was prepped, draped, and anesthetized with 1% lidocaine. Using the modified Seldinger technique, a 5 French sheath was introduced into the right radial artery. 3 mg of verapamil was administered through the sheath,  weight-based unfractionated heparin was administered intravenously. Standard Judkins catheters were used for selective coronary angiography and left ventriculography. Catheter exchanges were performed over an exchange length guidewire. There were no immediate procedural complications. A TR band was used for radial hemostasis at the completion of the procedure. The patient was transferred to the post catheterization recovery area for further monitoring.  Procedural Findings:  Hemodynamics:  AO 120/68 mean 89  LV 120/16  Coronary angiography:  Coronary dominance: right  Left mainstem: arises from left cusp, no obstructive disease. Divides into LAD and LCx  Left anterior descending (LAD): Patent to the LV apex. The first and second diagonals are patent without obstruction. There is no significant stenosis throughout the LAD  Left circumflex (LCx): there is a large intermediate branch without significant stenosis. The OM branches are patent without significant disease.  Right coronary artery (RCA): the RCA is patent and dominant. The PDA and PLA branches are patent, but the PLA has an area of 50% long stenosis (small vessel in that region).  Left ventriculography: Left ventricular systolic function is moderately to severely decreased, with LVEF estimated at 35% (improved from initial echo)   Final Conclusions:  1. Mild nonobstructive CAD  2. Moderate-severe LV  dysfunction  Recommendations: will start apixaban tonight for atrial fibrillation/LA thrombus. Med Rx for mild CAD and cardiomyopathy.  [name]  08/01/2013, 3:13 PM         Assessment / Plan: 1. Atrial fib - fair rate control - BP soft - on Eliquis - long term plan not really outlined - discussed with Dr. Mare Ferrari who advised repeat TEE/CV in 3 months. Noted by Dr. Debara Nofsinger that she will need to be done in the OR due to her size and probable OSA.   2. Mild nonobstructive CAD per recent cath  3. Newly found NICM - EF of 35% by cath - 25 to 30% by TEE - discussed the need for salt restriction, daily weights, etc. She notes that her swelling is worse since she got home - I suspect some of this is from sitting with her legs down - try to elevate. Will have her take extra dose of Lasix for 3 days and then back to her normal dose.  4. 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 Meda Coffee - do in the OR according to Dr. Debara Febus  5. Morbid obesity - this is the crux of her issue. Unfortunately, not sure she will be able to make significant changes needed.   Will recheck lab today. See her back in 2 to 3 weeks. Check BP at home. Hope to try and add ACE on return. Plan TEE/CV in 3 months. Will ask Dr. Aundra Dubin to review as well.   Patient is agreeable to this plan and will call if any problems develop in the interim.   Burtis Junes, RN, [place] [address] [place], [place]  [id number] [phone]

## 2013-08-12 NOTE — Patient Instructions (Signed)
Continue with your current medicines but take an extra dose of your Lasix today, tomorrow and Sunday - then back to just one a day  Weigh yourself each morning and record.  Take extra dose of diuretic for weight gain of  2 to 3 pounds in 24 hours.   Limit sodium intake. Goal is to have less than 2000 mg (2gm) of salt per day.  Try to elevate your legs as much as possible  We will get a copy of your labs from your primary care doctor - Kimberly Cote, MD in [facility]  See [place] back in 2 to 3 weeks  Call the [place] office at [phone] if you have any questions, problems or concerns.

## 2013-08-15 ENCOUNTER — Other Ambulatory Visit (HOSPITAL_COMMUNITY): Payer: Self-pay | Admitting: Family Medicine

## 2013-08-23 ENCOUNTER — Other Ambulatory Visit: Payer: Self-pay | Admitting: *Deleted

## 2013-08-23 ENCOUNTER — Encounter: Payer: Self-pay | Admitting: Nurse Practitioner

## 2013-08-23 ENCOUNTER — Ambulatory Visit (INDEPENDENT_AMBULATORY_CARE_PROVIDER_SITE_OTHER): Payer: Medicare Other | Admitting: Nurse Practitioner

## 2013-08-23 VITALS — BP 100/70 | HR 57 | Ht 66.0 in | Wt 360.1 lb

## 2013-08-23 DIAGNOSIS — I5022 Chronic systolic (congestive) heart failure: Secondary | ICD-10-CM

## 2013-08-23 DIAGNOSIS — I4891 Unspecified atrial fibrillation: Secondary | ICD-10-CM

## 2013-08-23 DIAGNOSIS — I200 Unstable angina: Secondary | ICD-10-CM

## 2013-08-23 LAB — CBC
HCT: 33 % — ABNORMAL LOW (ref 36.0–46.0)
Hemoglobin: 11 g/dL — ABNORMAL LOW (ref 12.0–15.0)
MCHC: 33.4 g/dL (ref 30.0–36.0)
MCV: 88.6 fl (ref 78.0–100.0)
Platelets: 242 10*3/uL (ref 150.0–400.0)
RBC: 3.73 Mil/uL — ABNORMAL LOW (ref 3.87–5.11)
RDW: 15.4 % — ABNORMAL HIGH (ref 11.5–14.6)
WBC: 5 10*3/uL (ref 4.5–10.5)

## 2013-08-23 LAB — BASIC METABOLIC PANEL
BUN: 16 mg/dL (ref 6–23)
CO2: 29 mEq/L (ref 19–32)
Calcium: 9.5 mg/dL (ref 8.4–10.5)
Chloride: 102 mEq/L (ref 96–112)
Creatinine, Ser: 1 mg/dL (ref 0.4–1.2)
GFR: 78.05 mL/min (ref >60.00)
Glucose, Bld: 92 mg/dL (ref 70–99)
Potassium: 4.4 mEq/L (ref 3.5–5.1)
Sodium: 138 mEq/L (ref 135–145)

## 2013-08-23 MED ORDER — METOPROLOL SUCCINATE ER 50 MG PO TB24: 50.0000 mg | ORAL_TABLET | Freq: Two times a day (BID) | ORAL | Status: DC

## 2013-08-23 MED ORDER — FUROSEMIDE 40 MG PO TABS: 40.0000 mg | ORAL_TABLET | Freq: Every day | ORAL | Status: DC

## 2013-08-23 MED ORDER — LISINOPRIL 2.5 MG PO TABS: 2.5000 mg | ORAL_TABLET | Freq: Every day | ORAL | Status: DC

## 2013-08-23 NOTE — Progress Notes (Signed)
Kimberly [name] Date of Birth: 1956-03-20 Medical Record #[id number]  History of Present Illness: Kimberly [name] is seen back today for a 2 week check. Seen for Dr. Aundra Dubin. She is a 58 year old female with treated HLD, AF, morbid obesity HLD and past knee replacement. She is followed medically by Dr. Kathlene Cote of [facility].   Most recently presented to the [facility] ER with worsening SOB and chest discomfort - Seen by Dr. Aundra Dubin. She states that she was first diagnosed with atrial fibrillation a little over a year ago. She has not been on oral anticoagulation, but had been on daily ASA. She is 25 mg of Toprol-XL for rate control. She was referred to a cardiologist in [facility] when first diagnosed, but has not returned for follow-up since the initial visit. She was referred for cardioversion but TEE showed left atrial thrombus - was anticoagulated with Eliquis and rate was controlled with Diltiazem which was transitioned over to beta blocker due to her low EF. She has been maintained on aspirin as well. Echo showed a reduction in her EF - down to 25 to 30%. Cath was 35%. She was diuresed and placed on beta blocker. BP too soft for ACE. Plan was for repeat TEE/CV in 3 months - which should be in the OR given her size and probable OSA.   Seen 2 weeks ago - was doing ok. Had had some extra salt - we increased her Lasix for a couple of days.   Comes back today. Here with her husband. Doing ok. Breathing is ok. Not aware of her atrial fib. No dizziness. BP running around 350 systolic at home. Some swelling in her left knee - tried to go and get the "fluid drained out" but was turned down by orth due to her heart. Also asked for a formal weight loss program.   Current Outpatient Prescriptions  Medication Sig Dispense Refill  . acetaminophen (TYLENOL) 325 MG tablet Take 325 mg by mouth every 6 (six) hours as needed for moderate pain.      [name] apixaban (ELIQUIS) 5 MG TABS tablet Take 1 tablet (5 mg total)  by mouth 2 (two) times daily.  60 tablet  2  . aspirin EC 325 MG tablet Take 325 mg by mouth daily.      . Cholecalciferol (VITAMIN D-3) 5000 UNITS TABS Take 5,000 Units by mouth daily.      . ferrous sulfate 325 (65 FE) MG tablet Take 1 tablet (325 mg total) by mouth 2 (two) times daily with a meal.  60 tablet  0  . furosemide (LASIX) 40 MG tablet Take 1 tablet (40 mg total) by mouth daily.  30 tablet  0  . GARLIC PO Take 2 tablets by mouth daily.      . metoprolol succinate (TOPROL-XL) 50 MG 24 hr tablet Take 1 tablet (50 mg total) by mouth 2 (two) times daily. Take with or immediately following a meal.  30 tablet  0  . nitroGLYCERIN (NITROSTAT) 0.4 MG SL tablet Place 0.4 mg under the tongue every 5 (five) minutes as needed for chest pain.      . Prenatal Vit-Iron Carbonyl-FA (PRENATAL PLUS IRON PO) Take 1 tablet by mouth daily.       No current facility-administered medications for this visit.    No Known Allergies  Past Medical History  Diagnosis Date  . Aortic stenosis   . Complication of anesthesia   . PONV (postoperative nausea and vomiting)   .  Atrial fibrillation   . NICM (nonischemic cardiomyopathy)     EF 30%  . Arthritis     KNEES  . Morbid obesity   . HLD (hyperlipidemia)   . Diabetes   . Left atrial thrombus     Past Surgical History  Procedure Laterality Date  . Joint replacement    . Tee without cardioversion N/A 07/29/2013    Procedure: TRANSESOPHAGEAL ECHOCARDIOGRAM (TEE);  Surgeon: [name], MD;  Location: [place];  Service: Cardiovascular;  Laterality: N/A;  . Cardioversion N/A 07/29/2013    Procedure: CARDIOVERSION;  Surgeon: [name], MD;  Location: [facility] ENDOSCOPY;  Service: Cardiovascular;  Laterality: N/A;    History  Smoking status  . Never Smoker   Smokeless tobacco  . Never Used    History  Alcohol Use No    Family History  Problem Relation Age of Onset  . CVA Mother     Review of Systems: The review of systems is  per the HPI.  All other systems were reviewed and are negative.  Physical Exam: BP 100/70  Pulse 57  Ht 5\' 6"  (1.676 m)  Wt 360 lb 1.9 oz (163.349 kg)  BMI 58.15 kg/m2  SpO2 93% Patient is very pleasant and in no acute distress. Skin is warm and dry. Color is normal.  HEENT is unremarkable. Normocephalic/atraumatic. PERRL. Sclera are nonicteric. Neck is supple. No masses. No JVD. Lungs are clear. Cardiac exam shows an irregular rhythm. Rate is ok. Abdomen is soft. Extremities are without edema. Gait and ROM are intact. No gross neurologic deficits noted.  Wt Readings from Last 3 Encounters:  08/23/13 360 lb 1.9 oz (163.349 kg)  08/12/13 364 lb 6.4 oz (165.291 kg)  08/02/13 362 lb 10.5 oz (164.5 kg)     LABORATORY DATA: EKG today shows atrial fib - fair rate control - rate is 96.    Lab Results  Component Value Date   WBC 5.5 08/12/2013   HGB 11.3* 08/12/2013   HCT 35.0* 08/12/2013   PLT 277.0 08/12/2013   GLUCOSE 89 08/12/2013   CHOL 178 07/29/2013   TRIG 85 07/29/2013   HDL 46 07/29/2013   LDLCALC 115* 07/29/2013   NA 138 08/12/2013   K 4.0 08/12/2013   CL 102 08/12/2013   CREATININE 1.0 08/12/2013   BUN 14 08/12/2013   CO2 29 08/12/2013   TSH 1.712 07/28/2013   INR 1.08 08/01/2013   HGBA1C 6.5* 07/28/2013   TEE Study Conclusions from March 2015  - Left ventricle: Systolic function was severely reduced. The estimated ejection fraction was in the range of 25% to 30%. Wall motion was normal; there were no regional wall motion abnormalities. - Mitral valve: Moderate regurgitation. - Left atrium: The atrium was dilated. No evidence of thrombus in the atrial cavity or appendage. - Right atrium: No evidence of thrombus in the atrial cavity or appendage. - Atrial septum: There was a patent foramen ovale. - Tricuspid valve: Moderate-severe regurgitation. - Pulmonary arteries: PA peak pressure: 37mm Hg (S). - Recommendations: This study was terminated early because of  desaturation. There is a thombus in the left atrial appendage. Long term anticoagulation and repeat TEE in 3 months is recommended. Recommendations: This study was terminated early because of desaturation.  Procedure: Left Heart Cath, Selective Coronary Angiography, LV angiography  Indication: Cardiomyopathy  Procedural Details: The right wrist was prepped, draped, and anesthetized with 1% lidocaine. Using the modified Seldinger technique, a 5 French sheath was introduced into the right  radial artery. 3 mg of verapamil was administered through the sheath, weight-based unfractionated heparin was administered intravenously. Standard Judkins catheters were used for selective coronary angiography and left ventriculography. Catheter exchanges were performed over an exchange length guidewire. There were no immediate procedural complications. A TR band was used for radial hemostasis at the completion of the procedure. The patient was transferred to the post catheterization recovery area for further monitoring.  Procedural Findings:  Hemodynamics:  AO 120/68 mean 89  LV 120/16  Coronary angiography:  Coronary dominance: right  Left mainstem: arises from left cusp, no obstructive disease. Divides into LAD and LCx  Left anterior descending (LAD): Patent to the LV apex. The first and second diagonals are patent without obstruction. There is no significant stenosis throughout the LAD  Left circumflex (LCx): there is a large intermediate branch without significant stenosis. The OM branches are patent without significant disease.  Right coronary artery (RCA): the RCA is patent and dominant. The PDA and PLA branches are patent, but the PLA has an area of 50% long stenosis (small vessel in that region).  Left ventriculography: Left ventricular systolic function is moderately to severely decreased, with LVEF estimated at 35% (improved from initial echo)  Final Conclusions:  1. Mild nonobstructive CAD  2.  Moderate-severe LV dysfunction  Recommendations: will start apixaban tonight for atrial fibrillation/LA thrombus. Med Rx for mild CAD and cardiomyopathy.  [name]  08/01/2013, 3:13 PM      Assessment / Plan:  1. Atrial fib - should have repeat TEE/CV in 3 months (mid June). Noted by Dr. Debara Penick that she will need to be done in the OR due to her size and probable OSA. Rate is fair. I have left her on the metoprolol.   2. Mild nonobstructive CAD per recent cath - no chest pain.   3. Newly found NICM - EF of 35% by cath - 25 to 30% by TEE - discussed the need for continued salt restriction, daily weights, etc. BP is higher at home - around 858 systolic - will try to add low dose ACE.   4. 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 Meda Coffee - do in the OR according to Dr. Debara Lebow   5. Morbid obesity - this is the crux of her issue. Asking for weight loss "program". Weight watchers would be ok. Discussed my tips as well.  Will recheck lab today. See her back in 3 weeks. Check BP at home.  Plan TEE/CV in 3 months. Recheck BMET on return.  Patient is agreeable to this plan and will call if any problems develop in the interim.   Burtis Junes, RN, [place]  [address] [place]  [place], [place] [address]  [phone]

## 2013-08-23 NOTE — Patient Instructions (Signed)
We will recheck lab today  Monitor your blood pressure at home  I am going to start you on Lisinopril 2.5 mg - take at bedtime - this is at the drug store.   I will see you in 3 weeks  Here are my tips to lose weight that we talked about today:  1. Drink only water. You do not need milk, juice, tea, soda or diet soda.  2. Do not eat anything "white". This includes white bread, potatoes, rice or mayo  3. Stay away from fried foods and sweets  4. Your portion should be the size of the palm of your hand.  5. Know what your weaknesses are and avoid.  6. Find an exercise you like and do it every day for 45 to 60 minutes.        Call the [place] office at [phone] if you have any questions, problems or concerns.

## 2013-08-24 ENCOUNTER — Other Ambulatory Visit: Payer: Medicare Other

## 2013-08-30 ENCOUNTER — Other Ambulatory Visit (HOSPITAL_COMMUNITY): Payer: Self-pay | Admitting: Family Medicine

## 2013-09-01 ENCOUNTER — Ambulatory Visit (INDEPENDENT_AMBULATORY_CARE_PROVIDER_SITE_OTHER): Payer: Medicare Other | Admitting: Pharmacist Clinician (PhC)/ Clinical Pharmacy Specialist

## 2013-09-01 ENCOUNTER — Encounter: Payer: Medicare Other | Admitting: Pharmacist

## 2013-09-01 DIAGNOSIS — Z5181 Encounter for therapeutic drug level monitoring: Secondary | ICD-10-CM

## 2013-09-01 DIAGNOSIS — I4891 Unspecified atrial fibrillation: Secondary | ICD-10-CM

## 2013-09-01 NOTE — Patient Instructions (Signed)
Continue with Eliquis 5mg  twice daily.  For Constipation try Miralax (store generic is fine) - 1 capful once daily.  Ok to mix in water or prune juice.    Keep appointment with Cecille Rubin for later this month

## 2013-09-01 NOTE — Progress Notes (Signed)
Pt was started on Eliquis for atrial fibrillation on March 17.    Reviewed patients medication list.  Pt is not currently on any combined P-gp and strong CYP3A4 inhibitors/inducers (ketoconazole, traconazole, ritonavir, carbamazepine, phenytoin, rifampin, St. John's wort).  Reviewed labs.  SCr 1.0, Weight 360 lb, CrCl- 99.  Dose appropriate based on CrCl.   Hgb and HCT low, being followed by Truitt Merle.  A full discussion of the nature of anticoagulants has been carried out.  A benefit/risk analysis has been presented to the patient, so that they understand the justification for choosing anticoagulation with Eliquis at this time.  The need for compliance is stressed.  Pt is aware to take the medication twice daily.  Side effects of potential bleeding are discussed, including unusual colored urine or stools, coughing up blood or coffee ground emesis, nose bleeds or serious fall or head trauma.  Discussed signs and symptoms of stroke. The patient should avoid any OTC items containing aspirin or ibuprofen.  Avoid alcohol consumption.   Call if any signs of abnormal bleeding.  Discussed financial obligations and resolved any difficulty in obtaining medication.  Next lab test test in 6 months.

## 2013-09-03 ENCOUNTER — Other Ambulatory Visit (HOSPITAL_COMMUNITY): Payer: Self-pay | Admitting: Family Medicine

## 2013-09-09 ENCOUNTER — Encounter: Payer: Self-pay | Admitting: *Deleted

## 2013-09-09 NOTE — Telephone Encounter (Signed)
This encounter was created in error - please disregard.

## 2013-09-12 ENCOUNTER — Other Ambulatory Visit (HOSPITAL_COMMUNITY): Payer: Self-pay | Admitting: Family Medicine

## 2013-09-12 NOTE — Telephone Encounter (Signed)
Not our dr

## 2013-09-13 ENCOUNTER — Ambulatory Visit (INDEPENDENT_AMBULATORY_CARE_PROVIDER_SITE_OTHER): Payer: Medicare Other | Admitting: Nurse Practitioner

## 2013-09-13 ENCOUNTER — Encounter: Payer: Self-pay | Admitting: Nurse Practitioner

## 2013-09-13 ENCOUNTER — Telehealth: Payer: Self-pay | Admitting: *Deleted

## 2013-09-13 ENCOUNTER — Telehealth: Payer: Self-pay | Admitting: Nurse Practitioner

## 2013-09-13 VITALS — BP 100/60 | HR 54 | Ht 66.0 in | Wt 352.1 lb

## 2013-09-13 DIAGNOSIS — I5022 Chronic systolic (congestive) heart failure: Secondary | ICD-10-CM

## 2013-09-13 DIAGNOSIS — I200 Unstable angina: Secondary | ICD-10-CM

## 2013-09-13 DIAGNOSIS — I4891 Unspecified atrial fibrillation: Secondary | ICD-10-CM

## 2013-09-13 LAB — BASIC METABOLIC PANEL
BUN: 17 mg/dL (ref 6–23)
CO2: 29 mEq/L (ref 19–32)
Calcium: 9.5 mg/dL (ref 8.4–10.5)
Chloride: 101 mEq/L (ref 96–112)
Creatinine, Ser: 1 mg/dL (ref 0.4–1.2)
GFR: 71.08 mL/min (ref >60.00)
Glucose, Bld: 94 mg/dL (ref 70–99)
Potassium: 3.4 mEq/L — ABNORMAL LOW (ref 3.5–5.1)
Sodium: 139 mEq/L (ref 135–145)

## 2013-09-13 LAB — CBC
HCT: 35.3 % — ABNORMAL LOW (ref 36.0–46.0)
Hemoglobin: 11.6 g/dL — ABNORMAL LOW (ref 12.0–15.0)
MCHC: 32.9 g/dL (ref 30.0–36.0)
MCV: 88.4 fl (ref 78.0–100.0)
Platelets: 237 10*3/uL (ref 150.0–400.0)
RBC: 3.99 Mil/uL (ref 3.87–5.11)
RDW: 15.1 % — ABNORMAL HIGH (ref 11.5–14.6)
WBC: 5.6 10*3/uL (ref 4.5–10.5)

## 2013-09-13 MED ORDER — LISINOPRIL 5 MG PO TABS: 5.0000 mg | ORAL_TABLET | Freq: Every day | ORAL | Status: DC

## 2013-09-13 NOTE — Telephone Encounter (Signed)
New message    Patient was seen today . Would like for Danielle to give her a call.

## 2013-09-13 NOTE — Telephone Encounter (Signed)
[employer] denied PA for ferrous sulfate, prenatal vitamins with iron, reason:  "Under Medicare Part D programs vitamins and minerals are excluded from coverage."

## 2013-09-13 NOTE — Progress Notes (Signed)
[name] Date of Birth: 03/15/1956 Medical Record #[id number]  History of Present Illness: Ms. Cottam is seen back today for a 3 week check. Seen for Dr. Aundra Dubin. She is a 58 year old female with treated HLD, AF, morbid obesity HLD and past knee replacement. She is followed medically by Dr. Kathlene Cote of [facility].   Most recently presented to the [facility] ER with worsening SOB and chest discomfort - Seen by Dr. Aundra Dubin. She states that she was first diagnosed with atrial fibrillation a little over a year ago. She has not been on oral anticoagulation, but had been on daily ASA. She was on 25 mg of Toprol-XL for rate control. She was referred to a cardiologist in [facility] when first diagnosed, but has not returned for follow-up since the initial visit. She was referred for cardioversion but TEE showed left atrial thrombus - was anticoagulated with Eliquis and rate was controlled with Diltiazem which was transitioned over to beta blocker due to her low EF. She has been maintained on aspirin as well. Echo showed a reduction in her EF - down to 25 to 30%. Cath was 35%. She was diuresed and placed on beta blocker. BP too soft for ACE. Plan was for repeat TEE/CV in 3 months - which should be in the OR given her size and probable OSA.   Last seen 3 weeks ago - was doing ok - had some swelling in her left knee - tried to go and get the "fluid drained out" but was turned down by orth due to her heart. Also asked for a formal weight loss program and was turned down as well. Added low dose ACE to her regimen.   Comes back today. Here with her husband.  She is doing well. Not short of breath. Weight is down. She is really watching her salt and restricting it. No chest pain. Very constipated - probably from her vitamin with iron along with her iron BID. Not able to get these with her insurance - she is on Medicare. Also still on aspirin. On Maxzide as well. Tolerating the ACE - not dizzy or lightheaded.  Feels pretty good.    Current Outpatient Prescriptions  Medication Sig Dispense Refill  . acetaminophen (TYLENOL) 325 MG tablet Take 325 mg by mouth every 6 (six) hours as needed for moderate pain.      [name] apixaban (ELIQUIS) 5 MG TABS tablet Take 1 tablet (5 mg total) by mouth 2 (two) times daily.  60 tablet  2  . aspirin EC 325 MG tablet Take 325 mg by mouth daily.      . Cholecalciferol (VITAMIN D-3) 5000 UNITS TABS Take 5,000 Units by mouth daily.      . ferrous sulfate 325 (65 FE) MG tablet Take 1 tablet (325 mg total) by mouth 2 (two) times daily with a meal.  60 tablet  0  . furosemide (LASIX) 40 MG tablet Take 1 tablet (40 mg total) by mouth daily.  30 tablet  6  . GARLIC PO Take 2 tablets by mouth daily.      [name] lisinopril (PRINIVIL,ZESTRIL) 2.5 MG tablet Take 1 tablet (2.5 mg total) by mouth daily.  30 tablet  6  . metoprolol succinate (TOPROL-XL) 50 MG 24 hr tablet Take 1 tablet (50 mg total) by mouth 2 (two) times daily. Take with or immediately following a meal.  60 tablet  6  . nitroGLYCERIN (NITROSTAT) 0.4 MG SL tablet Place 0.4 mg under the  tongue every 5 (five) minutes as needed for chest pain.      . Prenatal Vit-Iron Carbonyl-FA (PRENATAL PLUS IRON PO) Take 1 tablet by mouth daily.      [name] triamterene-hydrochlorothiazide (MAXZIDE-25) 37.5-25 MG per tablet Take 1 tablet by mouth daily.        No current facility-administered medications for this visit.    No Known Allergies  Past Medical History  Diagnosis Date  . Aortic stenosis   . Complication of anesthesia   . PONV (postoperative nausea and vomiting)   . Atrial fibrillation   . NICM (nonischemic cardiomyopathy)     EF 30%  . Arthritis     KNEES  . Morbid obesity   . HLD (hyperlipidemia)   . Diabetes   . Left atrial thrombus     Past Surgical History  Procedure Laterality Date  . Joint replacement    . Tee without cardioversion N/A 07/29/2013    Procedure: TRANSESOPHAGEAL ECHOCARDIOGRAM (TEE);  Surgeon:  [name], MD;  Location: [place];  Service: Cardiovascular;  Laterality: N/A;  . Cardioversion N/A 07/29/2013    Procedure: CARDIOVERSION;  Surgeon: [name], MD;  Location: [facility] ENDOSCOPY;  Service: Cardiovascular;  Laterality: N/A;    History  Smoking status  . Never Smoker   Smokeless tobacco  . Never Used    History  Alcohol Use No    Family History  Problem Relation Age of Onset  . CVA Mother     Review of Systems: The review of systems is per the HPI.  All other systems were reviewed and are negative.  Physical Exam: BP 100/60  Pulse 54  Ht 5\' 6"  (1.676 m)  Wt 352 lb 1.9 oz (159.721 kg)  BMI 56.86 kg/m2  SpO2 93% Patient is very pleasant and in no acute distress. She is down 8 pounds. Skin is warm and dry. Color is normal.  HEENT is unremarkable. Normocephalic/atraumatic. PERRL. Sclera are nonicteric. Neck is supple. No masses. No JVD. Lungs are clear. Cardiac exam shows an irregular rhythm. Apical rate of 80 by me. Abdomen is soft. Extremities are without edema. Gait and ROM are intact. No gross neurologic deficits noted.  Wt Readings from Last 3 Encounters:  09/13/13 352 lb 1.9 oz (159.721 kg)  08/23/13 360 lb 1.9 oz (163.349 kg)  08/12/13 364 lb 6.4 oz (165.291 kg)     LABORATORY DATA: PENDING  Lab Results  Component Value Date   WBC 5.0 08/23/2013   HGB 11.0* 08/23/2013   HCT 33.0* 08/23/2013   PLT 242.0 08/23/2013   GLUCOSE 92 08/23/2013   CHOL 178 07/29/2013   TRIG 85 07/29/2013   HDL 46 07/29/2013   LDLCALC 115* 07/29/2013   NA 138 08/23/2013   K 4.4 08/23/2013   CL 102 08/23/2013   CREATININE 1.0 08/23/2013   BUN 16 08/23/2013   CO2 29 08/23/2013   TSH 1.712 07/28/2013   INR 1.08 08/01/2013   HGBA1C 6.5* 07/28/2013   TEE Study Conclusions from March 2015  - Left ventricle: Systolic function was severely reduced. The estimated ejection fraction was in the range of 25% to 30%. Wall motion was normal; there were no regional wall motion  abnormalities. - Mitral valve: Moderate regurgitation. - Left atrium: The atrium was dilated. No evidence of thrombus in the atrial cavity or appendage. - Right atrium: No evidence of thrombus in the atrial cavity or appendage. - Atrial septum: There was a patent foramen ovale. - Tricuspid valve: Moderate-severe regurgitation. -  Pulmonary arteries: PA peak pressure: 42mm Hg (S). - Recommendations: This study was terminated early because of desaturation. There is a thombus in the left atrial appendage. Long term anticoagulation and repeat TEE in 3 months is recommended. Recommendations: This study was terminated early because of Desaturation.   Procedure: Left Heart Cath, Selective Coronary Angiography, LV angiography  Indication: Cardiomyopathy  Procedural Details: The right wrist was prepped, draped, and anesthetized with 1% lidocaine. Using the modified Seldinger technique, a 5 French sheath was introduced into the right radial artery. 3 mg of verapamil was administered through the sheath, weight-based unfractionated heparin was administered intravenously. Standard Judkins catheters were used for selective coronary angiography and left ventriculography. Catheter exchanges were performed over an exchange length guidewire. There were no immediate procedural complications. A TR band was used for radial hemostasis at the completion of the procedure. The patient was transferred to the post catheterization recovery area for further monitoring.  Procedural Findings:  Hemodynamics:  AO 120/68 mean 89  LV 120/16  Coronary angiography:  Coronary dominance: right  Left mainstem: arises from left cusp, no obstructive disease. Divides into LAD and LCx  Left anterior descending (LAD): Patent to the LV apex. The first and second diagonals are patent without obstruction. There is no significant stenosis throughout the LAD  Left circumflex (LCx): there is a large intermediate branch without significant  stenosis. The OM branches are patent without significant disease.  Right coronary artery (RCA): the RCA is patent and dominant. The PDA and PLA branches are patent, but the PLA has an area of 50% long stenosis (small vessel in that region).  Left ventriculography: Left ventricular systolic function is moderately to severely decreased, with LVEF estimated at 35% (improved from initial echo)  Final Conclusions:  1. Mild nonobstructive CAD  2. Moderate-severe LV dysfunction  Recommendations: will start apixaban tonight for atrial fibrillation/LA thrombus. Med Rx for mild CAD and cardiomyopathy.  [name]  08/01/2013, 3:13 PM      Assessment / Plan:   1. Atrial fib - should have repeat TEE/CV in 3 months (mid June). Noted by Dr. Debara Meder that she will need to be done in the OR due to her size and probable OSA. Rate is fair. I have left her on the metoprolol. Remains on Eliquis. Aspirin stopped today.   2. Mild nonobstructive CAD per recent cath - no chest pain.   3. Newly found NICM - EF of 35% by cath - 25 to 30% by TEE - discussed the need for continued salt restriction, daily weights, etc. Low dose ACE started at last visit - recheck BMET today. Stop the Maxzide today and increase the ACE - she remains on her Lasix   4. 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 Meda Coffee - do in the OR according to Dr. Debara Velarde   5. Morbid obesity - weight is down.   6. Constipation - most likely from all the iron - recheck her labs - will hold these agents until seen back  Will recheck lab today. See Dr. Aundra Dubin in a month at the heart failure clinic. Plan TEE/CV in 3 months (mid June). Checking labs today.   Patient is agreeable to this plan and will call if any problems develop in the interim.   Burtis Junes, RN, [place]  [address] [place]  [place], [place] [address]  [phone]

## 2013-09-13 NOTE — Patient Instructions (Addendum)
You are doing well  Keep restricting the salt  We will check lab today  Stop the iron and prenatal vitamin with iron for now  Stop the aspirin  Stop the Maxzide  Increase the Lisinopril to 5 mg each night - I have sent this to the drug store  See Dr. Aundra Dubin in a month - in the Heart Failure clinic at [facility] - May 28th at 9:40 - Code to enter is 0040  Call the [place] office at [phone] if you have any questions, problems or concerns.

## 2013-09-14 NOTE — Telephone Encounter (Signed)
Follow up     Patient calling back this am to speak with Andee Poles

## 2013-09-23 ENCOUNTER — Telehealth: Payer: Self-pay | Admitting: Nurse Practitioner

## 2013-09-23 DIAGNOSIS — R609 Edema, unspecified: Secondary | ICD-10-CM

## 2013-09-23 MED ORDER — FUROSEMIDE 40 MG PO TABS: 60.0000 mg | ORAL_TABLET | Freq: Every day | ORAL | Status: DC

## 2013-09-23 NOTE — Telephone Encounter (Signed)
S/w pt weight remains the same at 357.6 lb since Tuesday today was 356.4 both ankles still swollen been taking extra does of lasix since Tuesday Lori stated to take 1 and 1/2 tablet per day and come in Friday 5/15 for repeat bmet pt, stated elevate feet and stay away from salt pt aware and agreeable to plan

## 2013-09-23 NOTE — Telephone Encounter (Signed)
New message     Pt said her feet are swollen.  She takes fluid pills.  Should she take more medication?  She does not think the fluid pills are working.

## 2013-09-23 NOTE — Telephone Encounter (Signed)
Is her weight up??

## 2013-09-30 ENCOUNTER — Other Ambulatory Visit (INDEPENDENT_AMBULATORY_CARE_PROVIDER_SITE_OTHER): Payer: Medicare Other

## 2013-09-30 DIAGNOSIS — R609 Edema, unspecified: Secondary | ICD-10-CM

## 2013-09-30 LAB — BASIC METABOLIC PANEL
BUN: 16 mg/dL (ref 6–23)
CO2: 28 mEq/L (ref 19–32)
Calcium: 9.1 mg/dL (ref 8.4–10.5)
Chloride: 106 mEq/L (ref 96–112)
Creatinine, Ser: 1 mg/dL (ref 0.4–1.2)
GFR: 78.02 mL/min (ref >60.00)
Glucose, Bld: 88 mg/dL (ref 70–99)
Potassium: 3.9 mEq/L (ref 3.5–5.1)
Sodium: 141 mEq/L (ref 135–145)

## 2013-10-03 ENCOUNTER — Telehealth: Payer: Self-pay | Admitting: Nurse Practitioner

## 2013-10-03 NOTE — Telephone Encounter (Signed)
New message ° ° ° ° °Want test results °

## 2013-10-07 ENCOUNTER — Other Ambulatory Visit: Payer: Self-pay

## 2013-10-07 DIAGNOSIS — R609 Edema, unspecified: Secondary | ICD-10-CM

## 2013-10-07 MED ORDER — FUROSEMIDE 40 MG PO TABS: 60.0000 mg | ORAL_TABLET | Freq: Every day | ORAL | Status: DC

## 2013-10-11 ENCOUNTER — Telehealth: Payer: Self-pay | Admitting: *Deleted

## 2013-10-11 NOTE — Telephone Encounter (Signed)
Dr. Gery Pray office called to s/w Cecille Rubin about pt's labs requested to be faxed over @ [phone] will give to medical records to fax

## 2013-10-11 NOTE — Telephone Encounter (Signed)
Opened in error

## 2013-10-12 ENCOUNTER — Telehealth: Payer: Self-pay | Admitting: Nurse Practitioner

## 2013-10-12 NOTE — Telephone Encounter (Signed)
Received phone call on 10/11/13 from patient's PCP - Dr. Gery Pray. He was upset that the patient's Pravachol had been discontinued - thought that I had stopped which I had not. Chart reviewed. Explained that this was stopped at her last admission from the hospitalist - and not clear as to why.   I verbally gave him her most recent lab results.

## 2013-10-13 ENCOUNTER — Ambulatory Visit (HOSPITAL_COMMUNITY)
Admission: RE | Admit: 2013-10-13 | Discharge: 2013-10-13 | Disposition: A | Discharge status: Home / Self Care | Payer: Medicare Other | Source: Ambulatory Visit | Attending: Internal Medicine | Admitting: Internal Medicine

## 2013-10-13 VITALS — BP 122/74 | HR 55 | Wt 348.0 lb

## 2013-10-13 DIAGNOSIS — I5189 Other ill-defined heart diseases: Secondary | ICD-10-CM

## 2013-10-13 DIAGNOSIS — I428 Other cardiomyopathies: Secondary | ICD-10-CM | POA: Insufficient documentation

## 2013-10-13 DIAGNOSIS — Z96659 Presence of unspecified artificial knee joint: Secondary | ICD-10-CM | POA: Insufficient documentation

## 2013-10-13 DIAGNOSIS — I513 Intracardiac thrombosis, not elsewhere classified: Secondary | ICD-10-CM

## 2013-10-13 DIAGNOSIS — I5021 Acute systolic (congestive) heart failure: Secondary | ICD-10-CM

## 2013-10-13 DIAGNOSIS — I4891 Unspecified atrial fibrillation: Secondary | ICD-10-CM | POA: Insufficient documentation

## 2013-10-13 DIAGNOSIS — I2510 Atherosclerotic heart disease of native coronary artery without angina pectoris: Secondary | ICD-10-CM | POA: Insufficient documentation

## 2013-10-13 DIAGNOSIS — Z7901 Long term (current) use of anticoagulants: Secondary | ICD-10-CM | POA: Insufficient documentation

## 2013-10-13 DIAGNOSIS — E669 Obesity, unspecified: Secondary | ICD-10-CM | POA: Insufficient documentation

## 2013-10-13 DIAGNOSIS — I509 Heart failure, unspecified: Secondary | ICD-10-CM

## 2013-10-13 DIAGNOSIS — E785 Hyperlipidemia, unspecified: Secondary | ICD-10-CM

## 2013-10-13 DIAGNOSIS — R609 Edema, unspecified: Secondary | ICD-10-CM

## 2013-10-13 DIAGNOSIS — I5022 Chronic systolic (congestive) heart failure: Secondary | ICD-10-CM | POA: Insufficient documentation

## 2013-10-13 MED ORDER — FUROSEMIDE 40 MG PO TABS: 40.0000 mg | ORAL_TABLET | Freq: Two times a day (BID) | ORAL | Status: DC

## 2013-10-13 MED ORDER — LISINOPRIL 10 MG PO TABS: 10.0000 mg | ORAL_TABLET | Freq: Two times a day (BID) | ORAL | Status: DC

## 2013-10-13 NOTE — Patient Instructions (Addendum)
Stop Aspirin  Stop Triamterene/hctz  Increase Furosemide (Lasix) to 40 mg Twice daily   Increase Lisinopril to 10 mg Twice daily   Labs in 10 days  Your physician recommends that you schedule a follow-up appointment in: 2-3 weeks

## 2013-10-13 NOTE — Progress Notes (Signed)
Patient ID: [name], female   DOB: 11/15/1955, 58 y.o.   MRN: [id number] PCP: Dr. Gery Pray [facility])  58 yo with history of persistent atrial fibrillation, obesity, and nonischemic cardiomyopathy presents for cardiology followup.  Patient was admitted in 3/15 at [facility] with atrial fibrillation and CHF.  TEE was done in preparation for DCCV, showing EF 25-30% but there was LA thrombus so DCCV was not done.  Plan was for anticoagulation x 3 months followed by TEE-guided DCCV in the OR (due to patient's body habitus).  LHC was also done, showing mild nonobstructive CAD.  Patient was diuresed with IV Lasix in the hospital and started on apixaban.   Patient has been breathing better since leaving the hospital.  Weight is down another 4 lbs.  She is not very active.  She walks in the house and goes to stores.  She is able to walk slowly on flat ground now without dyspnea.  She is short of breath with walking fast or "long distances."  No chest pain, no lightheadedness.    ECG: atrial fibrillation at 95 bpm with PVCs.    Labs (4/15): HCT 35.3 Labs (5/15): K 3.9, creatinine 1.0  PMH: 1. Obese 2. Atrial fibrillation: Persistent.  Unable to cardiovert in 3/15 due to LAA thrombus noted on TEE. 3. H/o TKR 4. Nonischemic cardiomyopathy: TEE (3/15) with EF 25-30%, moderate MR, PA systolic pressure 51 mmHg, moderate to severe TR, LA thrombus was noted.   LHC (3/15) with 50% stenosis small PLV. 5. CAD: Nonobstructive.  LHC (3/15) with 50% stenosis small PLV.  SH: Married, lives in [name], nonsmoker  FH: No premature CAD or cardiomyopathy.  ROS: All systems reviewed and negative except as per HPI.   Current Outpatient Prescriptions  Medication Sig Dispense Refill  . acetaminophen (TYLENOL) 325 MG tablet Take 325 mg by mouth every 6 (six) hours as needed for moderate pain.      [name] apixaban (ELIQUIS) 5 MG TABS tablet Take 1 tablet (5 mg total) by mouth 2 (two) times daily.  60 tablet  2  .  Cholecalciferol (VITAMIN D-3) 5000 UNITS TABS Take 5,000 Units by mouth daily.      [name] docusate sodium (COLACE) 100 MG capsule Take 100 mg by mouth daily as needed for mild constipation.      . Ferrous Sulfate (PX IRON) 27 MG TABS Take 1 tablet by mouth daily.      . furosemide (LASIX) 40 MG tablet Take 1 tablet (40 mg total) by mouth 2 (two) times daily.  60 tablet  6  . lisinopril (PRINIVIL,ZESTRIL) 10 MG tablet Take 1 tablet (10 mg total) by mouth 2 (two) times daily.  60 tablet  6  . metoprolol succinate (TOPROL-XL) 50 MG 24 hr tablet Take 1 tablet (50 mg total) by mouth 2 (two) times daily. Take with or immediately following a meal.  60 tablet  6  . nitroGLYCERIN (NITROSTAT) 0.4 MG SL tablet Place 0.4 mg under the tongue every 5 (five) minutes as needed for chest pain.      . pravastatin (PRAVACHOL) 40 MG tablet Take 40 mg by mouth daily.       No current facility-administered medications for this encounter.    BP 122/74  Pulse 55  Wt 348 lb (157.852 kg)  SpO2 96% General: NAD, obese Neck: JVP 8-9 cm, no thyromegaly or thyroid nodule.  Lungs: Clear to auscultation bilaterally with normal respiratory effort. CV: Nondisplaced PMI.  Heart regular S1/S2, no S3/S4,  no murmur.  Trace ankle edema.  No carotid bruit.  Normal pedal pulses.  Abdomen: Soft, nontender, no hepatosplenomegaly, no distention.  Skin: Intact without lesions or rashes.  Neurologic: Alert and oriented x 3.  Psych: Normal affect. Extremities: No clubbing or cyanosis.   Assessment/Plan: 1. Chronic systolic CHF: Nonischemic cardiomyopathy.  EF 25-30% on TEE in 3/15.  LHC without significant coronary disease in 3/15.  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  Patient has been dropping weight with Lasix but remains volume overloaded.  - Increase lisinopril to 10 mg bid and may stop triamterene/HCTZ.   - Change Lasix dosing to 40 mg bid.   - BMET/BNP in 10 days.  2. Atrial fibrillation: Persistent.  She is in atrial  fibrillation today.  As above, concern for tachy-mediated cardiomyopathy so would like to keep in NSR.  She had an LAA thrombus on TEE in 3/15.  Since then, she has been on Eliquis without missing a dose.  Will repeat TEE (in OR given body habitus and complication risk) in 5/17.  If no thrombus, will plan DCCV.  She can stop ASA as she is on Eliquis.   3. CAD: Mild, nonobstructive by cath.  Continue pravastatin and check lipids.  Can stop ASA given Eliquis use.    [name] 10/13/2013

## 2013-10-14 NOTE — Addendum Note (Signed)
Encounter addended by: Georga Kaufmann, CCT on: 10/14/2013  9:09 AM<BR>     Documentation filed: Charges VN

## 2013-10-24 ENCOUNTER — Ambulatory Visit (INDEPENDENT_AMBULATORY_CARE_PROVIDER_SITE_OTHER): Payer: Medicare Other | Admitting: *Deleted

## 2013-10-24 DIAGNOSIS — I5021 Acute systolic (congestive) heart failure: Secondary | ICD-10-CM

## 2013-10-24 DIAGNOSIS — I5189 Other ill-defined heart diseases: Secondary | ICD-10-CM

## 2013-10-24 DIAGNOSIS — I4729 Other ventricular tachycardia: Secondary | ICD-10-CM

## 2013-10-24 DIAGNOSIS — I509 Heart failure, unspecified: Secondary | ICD-10-CM

## 2013-10-24 DIAGNOSIS — I428 Other cardiomyopathies: Secondary | ICD-10-CM

## 2013-10-24 DIAGNOSIS — I4891 Unspecified atrial fibrillation: Secondary | ICD-10-CM

## 2013-10-24 DIAGNOSIS — R0602 Shortness of breath: Secondary | ICD-10-CM

## 2013-10-24 DIAGNOSIS — I513 Intracardiac thrombosis, not elsewhere classified: Secondary | ICD-10-CM

## 2013-10-24 DIAGNOSIS — I472 Ventricular tachycardia: Secondary | ICD-10-CM

## 2013-10-24 DIAGNOSIS — I429 Cardiomyopathy, unspecified: Secondary | ICD-10-CM

## 2013-10-24 DIAGNOSIS — E785 Hyperlipidemia, unspecified: Secondary | ICD-10-CM

## 2013-10-24 LAB — LIPID PANEL
Cholesterol: 157 mg/dL (ref 0–200)
HDL: 43.4 mg/dL (ref >39.00)
LDL Cholesterol: 106 mg/dL — ABNORMAL HIGH (ref 0–99)
NonHDL: 113.6
Total CHOL/HDL Ratio: 4
Triglycerides: 40 mg/dL (ref 0.0–149.0)
VLDL: 8 mg/dL (ref 0.0–40.0)

## 2013-10-24 LAB — BASIC METABOLIC PANEL
BUN: 15 mg/dL (ref 6–23)
CO2: 29 mEq/L (ref 19–32)
Calcium: 9.3 mg/dL (ref 8.4–10.5)
Chloride: 104 mEq/L (ref 96–112)
Creatinine, Ser: 1 mg/dL (ref 0.4–1.2)
GFR: 75.25 mL/min (ref >60.00)
Glucose, Bld: 95 mg/dL (ref 70–99)
Potassium: 4.3 mEq/L (ref 3.5–5.1)
Sodium: 139 mEq/L (ref 135–145)

## 2013-10-24 LAB — BRAIN NATRIURETIC PEPTIDE: Pro B Natriuretic peptide (BNP): 217 pg/mL — ABNORMAL HIGH (ref 0.0–100.0)

## 2013-10-26 ENCOUNTER — Ambulatory Visit (HOSPITAL_COMMUNITY)
Admission: RE | Admit: 2013-10-26 | Discharge: 2013-10-26 | Disposition: A | Discharge status: Home / Self Care | Payer: Medicare Other | Source: Ambulatory Visit | Attending: Internal Medicine | Admitting: Internal Medicine

## 2013-10-26 ENCOUNTER — Other Ambulatory Visit: Payer: Self-pay | Admitting: *Deleted

## 2013-10-26 ENCOUNTER — Encounter (HOSPITAL_COMMUNITY): Payer: Self-pay | Admitting: *Deleted

## 2013-10-26 VITALS — BP 118/62 | HR 90 | Wt 351.5 lb

## 2013-10-26 DIAGNOSIS — Z7901 Long term (current) use of anticoagulants: Secondary | ICD-10-CM | POA: Diagnosis not present

## 2013-10-26 DIAGNOSIS — M25569 Pain in unspecified knee: Secondary | ICD-10-CM | POA: Diagnosis not present

## 2013-10-26 DIAGNOSIS — I428 Other cardiomyopathies: Secondary | ICD-10-CM | POA: Diagnosis not present

## 2013-10-26 DIAGNOSIS — I219 Acute myocardial infarction, unspecified: Secondary | ICD-10-CM | POA: Insufficient documentation

## 2013-10-26 DIAGNOSIS — I2510 Atherosclerotic heart disease of native coronary artery without angina pectoris: Secondary | ICD-10-CM | POA: Diagnosis not present

## 2013-10-26 DIAGNOSIS — I4891 Unspecified atrial fibrillation: Secondary | ICD-10-CM | POA: Diagnosis not present

## 2013-10-26 DIAGNOSIS — I5189 Other ill-defined heart diseases: Secondary | ICD-10-CM

## 2013-10-26 DIAGNOSIS — I513 Intracardiac thrombosis, not elsewhere classified: Secondary | ICD-10-CM

## 2013-10-26 DIAGNOSIS — E669 Obesity, unspecified: Secondary | ICD-10-CM | POA: Diagnosis not present

## 2013-10-26 DIAGNOSIS — I5022 Chronic systolic (congestive) heart failure: Secondary | ICD-10-CM | POA: Insufficient documentation

## 2013-10-26 DIAGNOSIS — R609 Edema, unspecified: Secondary | ICD-10-CM

## 2013-10-26 DIAGNOSIS — Z96659 Presence of unspecified artificial knee joint: Secondary | ICD-10-CM | POA: Diagnosis not present

## 2013-10-26 DIAGNOSIS — I429 Cardiomyopathy, unspecified: Secondary | ICD-10-CM

## 2013-10-26 MED ORDER — METOPROLOL SUCCINATE ER 50 MG PO TB24: ORAL_TABLET | ORAL | Status: DC

## 2013-10-26 MED ORDER — SPIRONOLACTONE 25 MG PO TABS: 12.5000 mg | ORAL_TABLET | Freq: Every day | ORAL | Status: DC

## 2013-10-26 MED ORDER — FUROSEMIDE 40 MG PO TABS: 60.0000 mg | ORAL_TABLET | Freq: Two times a day (BID) | ORAL | Status: DC

## 2013-10-26 NOTE — Progress Notes (Signed)
Patient ID: [name], female   DOB: [birth year], 58 y.o.   MRN: [id number] PCP: Dr. Gery Pray [facility])  58 yo with history of persistent atrial fibrillation, obesity, and nonischemic cardiomyopathy presents for cardiology followup.  Patient was admitted in 3/15 at [facility] with atrial fibrillation and CHF.  TEE was done in preparation for DCCV, showing EF 25-30% but there was LA thrombus so DCCV was not done.  Plan was for anticoagulation x 3 months followed by TEE-guided DCCV in the OR (due to patient's body habitus).  LHC was also done, showing mild nonobstructive CAD.  Patient was diuresed with IV Lasix in the hospital and started on apixaban.   Patient remains in atrial fibrillation.  HR is controlled.  Weight is up 3 lbs.  She feels like she is retaining some fluid.  She has lower extremity edema.   She is walking with a cane without dyspnea on flat ground.   Main limitation to walking is knee pain.  No orthopnea or PND.  No chest pain.  No lightheadedness.   ECG: atrial fibrillation at 99 bpm with aberrantly conducted complexes    Labs (4/15): HCT 35.3 Labs (5/15): K 3.9, creatinine 1.0 Labs (6/15): K 4.3, creatinine 1.0, BNP 217  PMH: 1. Obese 2. Atrial fibrillation: Persistent.  Unable to cardiovert in 3/15 due to LAA thrombus noted on TEE. 3. H/o TKR 4. Nonischemic cardiomyopathy: TEE (3/15) with EF 25-30%, moderate MR, PA systolic pressure 51 mmHg, moderate to severe TR, LA thrombus was noted.   LHC (3/15) with 50% stenosis small PLV. 5. CAD: Nonobstructive.  LHC (3/15) with 50% stenosis small PLV.  SH: Married, lives in [place], nonsmoker  FH: No premature CAD or cardiomyopathy.  ROS: All systems reviewed and negative except as per HPI.   Current Outpatient Prescriptions  Medication Sig Dispense Refill  . acetaminophen (TYLENOL) 325 MG tablet Take 325 mg by mouth every 6 (six) hours as needed for moderate pain.      [name] apixaban (ELIQUIS) 5 MG TABS tablet Take 1 tablet  (5 mg total) by mouth 2 (two) times daily.  60 tablet  2  . Cholecalciferol (VITAMIN D-3) 5000 UNITS TABS Take 5,000 Units by mouth daily.      [name] docusate sodium (COLACE) 100 MG capsule Take 100 mg by mouth daily as needed for mild constipation.      . Ferrous Sulfate (PX IRON) 27 MG TABS Take 1 tablet by mouth daily.      . furosemide (LASIX) 40 MG tablet Take 1.5 tablets (60 mg total) by mouth 2 (two) times daily.  90 tablet  3  . lisinopril (PRINIVIL,ZESTRIL) 10 MG tablet Take 1 tablet (10 mg total) by mouth 2 (two) times daily.  60 tablet  6  . metoprolol succinate (TOPROL-XL) 50 MG 24 hr tablet Take 1 & 1/2 tabs Twice daily Take with or immediately following a meal.  90 tablet  3  . nitroGLYCERIN (NITROSTAT) 0.4 MG SL tablet Place 0.4 mg under the tongue every 5 (five) minutes as needed for chest pain.      . pravastatin (PRAVACHOL) 40 MG tablet Take 40 mg by mouth daily.      [name] spironolactone (ALDACTONE) 25 MG tablet Take 0.5 tablets (12.5 mg total) by mouth daily.  15 tablet  3   No current facility-administered medications for this encounter.    BP 118/62  Pulse 90  Wt 351 lb 8 oz (159.439 kg)  SpO2 98% General: NAD, obese  Neck: JVP 8-9 cm, no thyromegaly or thyroid nodule.  Lungs: Clear to auscultation bilaterally with normal respiratory effort. CV: Nondisplaced PMI.  Heart irregular S1/S2, no S3/S4, 1/6 SEM RUSB.  1+ ankle edema.  No carotid bruit.  Normal pedal pulses.  Abdomen: Soft, nontender, no hepatosplenomegaly, no distention.  Skin: Intact without lesions or rashes.  Neurologic: Alert and oriented x 3.  Psych: Normal affect. Extremities: No clubbing or cyanosis.   Assessment/Plan: 1. Chronic systolic CHF: Nonischemic cardiomyopathy.  EF 25-30% on TEE in 3/15.  LHC without significant coronary disease in 3/15.  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  Patient still appears to have some volume overload and weight is up.  - Increase Lasix to 60 mg bid.  - Add  spironolactone 12.5 mg daily. - Increase Toprol XL to 75 mg bid.  - BMET in 10 days.  2. Atrial fibrillation: Persistent.  She is in atrial fibrillation today.  As above, concern for tachy-mediated cardiomyopathy so would like to keep in NSR.  She had an LAA thrombus on TEE in 3/15.  Since then, she has been on Eliquis without missing a dose.  Will repeat TEE (in OR given body habitus and complication risk) in the next couple of weeks.  If no thrombus, will plan DCCV.   3. CAD: Mild, nonobstructive by cath.  Continue pravastatin.  No ASA given Eliquis use.    Followup in 1 month.  [name] 10/26/2013

## 2013-10-26 NOTE — Patient Instructions (Signed)
Increase Furosemide to 60 mg (1 & 1/2 tabs) Twice daily   Increase Metoprolol to 75 mg (1 & 1/2 tabs) Twice daily   Start Spironolactone 12.5 mg (1/2 tab) daily  Your physician has requested that you have a TEE/Cardioversion. During a TEE, sound waves are used to create images of your heart. It provides your doctor with information about the size and shape of your heart and how well your heart's chambers and valves are working. In this test, a transducer is attached to the end of a flexible tube that is guided down you throat and into your esophagus (the tube leading from your mouth to your stomach) to get a more detailed image of your heart. Once the TEE has determined that a blood clot is not present, the cardioversion begins. Electrical Cardioversion uses a jolt of electricity to your heart either through paddles or wired patches attached to your chest. This is a controlled, usually prescheduled, procedure. This procedure is done at the hospital and you are not awake during the procedure. You usually go home the day of the procedure. Please see the instruction sheet given to you today for more information.  Your physician recommends that you schedule a follow-up appointment in: 1 month

## 2013-10-27 NOTE — Addendum Note (Signed)
Encounter addended by: Evalee Mutton, CCT on: 10/27/2013  9:40 AM<BR>     Documentation filed: Charges VN

## 2013-10-28 ENCOUNTER — Telehealth: Payer: Self-pay | Admitting: Cardiology

## 2013-10-28 NOTE — Telephone Encounter (Signed)
New message ° ° ° ° °Pt want lab results °

## 2013-10-31 ENCOUNTER — Telehealth: Payer: Self-pay | Admitting: Cardiology

## 2013-10-31 NOTE — Telephone Encounter (Signed)
Attempted to call pt, no answer and no machine

## 2013-10-31 NOTE — Telephone Encounter (Signed)
Patient would like lab results. Please call and advise.

## 2013-10-31 NOTE — Telephone Encounter (Signed)
See phone note 6/15

## 2013-10-31 NOTE — Telephone Encounter (Signed)
Left message to call back  

## 2013-11-01 ENCOUNTER — Encounter (HOSPITAL_COMMUNITY): Payer: Self-pay | Admitting: Pharmacy Technician

## 2013-11-04 ENCOUNTER — Telehealth (HOSPITAL_COMMUNITY): Payer: Self-pay | Admitting: Cardiology

## 2013-11-04 NOTE — Telephone Encounter (Signed)
Pt scheduled for TEE/DCCV Cpt code 93318/92960 With pts current insurance Medicare A and B No pre cert req'd

## 2013-11-07 ENCOUNTER — Telehealth (HOSPITAL_COMMUNITY): Payer: Self-pay | Admitting: Cardiology

## 2013-11-07 NOTE — Telephone Encounter (Signed)
Message copied by Sula Fetterly, Sharlot Gowda on Mon Nov 07, 2013  4:49 PM ------      Message from: Katrine Coho      Created: Thu Oct 27, 2013  4:46 PM                   ----- Message -----         From: Verna Czech, RN         Sent: 10/27/2013   3:19 PM           To: Katrine Coho, RN                        ----- Message -----         From: [name], MD         Sent: 10/24/2013  10:57 PM           To: [place]rine Coho, RN, [facility] [address] Triage            stable ------

## 2013-11-08 ENCOUNTER — Other Ambulatory Visit: Payer: Self-pay | Admitting: *Deleted

## 2013-11-08 MED ORDER — APIXABAN 5 MG PO TABS: 5.0000 mg | ORAL_TABLET | Freq: Two times a day (BID) | ORAL | Status: DC

## 2013-11-09 NOTE — Telephone Encounter (Signed)
Message copied by Claribel Sachs, Sharlot Gowda on Wed Nov 09, 2013 10:49 AM ------      Message from: Katrine Coho      Created: Thu Oct 27, 2013  4:46 PM                   ----- Message -----         From: Verna Czech, RN         Sent: 10/27/2013   3:19 PM           To: Katrine Coho, RN                        ----- Message -----         From: [name], MD         Sent: 10/24/2013  10:57 PM           To: Katrine Coho, RN, [facility] [address] Triage            stable ------

## 2013-11-09 NOTE — Telephone Encounter (Signed)
Pt aware of lab results 

## 2013-11-11 ENCOUNTER — Telehealth (HOSPITAL_COMMUNITY): Payer: Self-pay | Admitting: Anesthesiology

## 2013-11-11 ENCOUNTER — Ambulatory Visit (HOSPITAL_COMMUNITY): Payer: Medicare Other | Admitting: Certified Registered Nurse Anesthetist

## 2013-11-11 ENCOUNTER — Encounter (HOSPITAL_COMMUNITY): Payer: Self-pay | Admitting: Anesthesiology

## 2013-11-11 ENCOUNTER — Ambulatory Visit (HOSPITAL_COMMUNITY)
Admission: RE | Admit: 2013-11-11 | Discharge: 2013-11-11 | Disposition: A | Discharge status: Home / Self Care | Payer: Medicare Other | Source: Ambulatory Visit | Attending: Cardiology | Admitting: Cardiology

## 2013-11-11 ENCOUNTER — Encounter (HOSPITAL_COMMUNITY)
Admission: RE | Disposition: A | Discharge status: Home / Self Care | Payer: Self-pay | Source: Ambulatory Visit | Attending: Cardiology

## 2013-11-11 ENCOUNTER — Encounter (HOSPITAL_COMMUNITY): Payer: Medicare Other | Admitting: Certified Registered Nurse Anesthetist

## 2013-11-11 DIAGNOSIS — Z7901 Long term (current) use of anticoagulants: Secondary | ICD-10-CM | POA: Insufficient documentation

## 2013-11-11 DIAGNOSIS — I5022 Chronic systolic (congestive) heart failure: Secondary | ICD-10-CM | POA: Insufficient documentation

## 2013-11-11 DIAGNOSIS — Q211 Atrial septal defect: Secondary | ICD-10-CM | POA: Diagnosis not present

## 2013-11-11 DIAGNOSIS — I4891 Unspecified atrial fibrillation: Secondary | ICD-10-CM

## 2013-11-11 DIAGNOSIS — Q2111 Secundum atrial septal defect: Secondary | ICD-10-CM | POA: Diagnosis not present

## 2013-11-11 DIAGNOSIS — I428 Other cardiomyopathies: Secondary | ICD-10-CM | POA: Insufficient documentation

## 2013-11-11 DIAGNOSIS — I2510 Atherosclerotic heart disease of native coronary artery without angina pectoris: Secondary | ICD-10-CM | POA: Insufficient documentation

## 2013-11-11 DIAGNOSIS — I079 Rheumatic tricuspid valve disease, unspecified: Secondary | ICD-10-CM | POA: Diagnosis not present

## 2013-11-11 DIAGNOSIS — I059 Rheumatic mitral valve disease, unspecified: Secondary | ICD-10-CM | POA: Insufficient documentation

## 2013-11-11 DIAGNOSIS — I509 Heart failure, unspecified: Secondary | ICD-10-CM | POA: Insufficient documentation

## 2013-11-11 HISTORY — PX: CARDIOVERSION: SHX1299

## 2013-11-11 HISTORY — PX: TEE WITHOUT CARDIOVERSION: SHX5443

## 2013-11-11 LAB — BASIC METABOLIC PANEL
BUN: 15 mg/dL (ref 6–23)
CO2: 25 mEq/L (ref 19–32)
Calcium: 9.8 mg/dL (ref 8.4–10.5)
Chloride: 99 mEq/L (ref 96–112)
Creatinine, Ser: 1 mg/dL (ref 0.50–1.10)
GFR calc Af Amer: 72 mL/min — ABNORMAL LOW (ref >90)
GFR calc non Af Amer: 62 mL/min — ABNORMAL LOW (ref >90)
Glucose, Bld: 95 mg/dL (ref 70–99)
Potassium: 3.9 mEq/L (ref 3.7–5.3)
Sodium: 141 mEq/L (ref 137–147)

## 2013-11-11 LAB — CBC WITH DIFFERENTIAL/PLATELET
Basophils Absolute: 0 10*3/uL (ref 0.0–0.1)
Basophils Relative: 0 % (ref 0–1)
Eosinophils Absolute: 0 10*3/uL (ref 0.0–0.7)
Eosinophils Relative: 1 % (ref 0–5)
HCT: 33.9 % — ABNORMAL LOW (ref 36.0–46.0)
Hemoglobin: 11 g/dL — ABNORMAL LOW (ref 12.0–15.0)
Lymphocytes Relative: 46 % (ref 12–46)
Lymphs Abs: 2.4 10*3/uL (ref 0.7–4.0)
MCH: 28.4 pg (ref 26.0–34.0)
MCHC: 32.4 g/dL (ref 30.0–36.0)
MCV: 87.6 fL (ref 78.0–100.0)
Monocytes Absolute: 0.4 10*3/uL (ref 0.1–1.0)
Monocytes Relative: 8 % (ref 3–12)
Neutro Abs: 2.4 10*3/uL (ref 1.7–7.7)
Neutrophils Relative %: 45 % (ref 43–77)
Platelets: 221 10*3/uL (ref 150–400)
RBC: 3.87 MIL/uL (ref 3.87–5.11)
RDW: 14.3 % (ref 11.5–15.5)
WBC: 5.2 10*3/uL (ref 4.0–10.5)

## 2013-11-11 SURGERY — ECHOCARDIOGRAM, TRANSESOPHAGEAL
Anesthesia: Monitor Anesthesia Care

## 2013-11-11 MED ORDER — MIDAZOLAM HCL 5 MG/5ML IJ SOLN
INTRAMUSCULAR | Status: DC | PRN
  Administered 2013-11-11: 2 mg via INTRAVENOUS

## 2013-11-11 MED ORDER — LACTATED RINGERS IV SOLN
INTRAVENOUS | Status: DC
  Administered 2013-11-11: 1000 mL via INTRAVENOUS

## 2013-11-11 MED ORDER — PROPOFOL INFUSION 10 MG/ML OPTIME
INTRAVENOUS | Status: DC | PRN
  Administered 2013-11-11: 25 ug/kg/min via INTRAVENOUS

## 2013-11-11 MED ORDER — SODIUM CHLORIDE 0.9 % IV SOLN: INTRAVENOUS | Status: DC

## 2013-11-11 MED ORDER — AMIODARONE HCL 200 MG PO TABS
400.0000 mg | ORAL_TABLET | Freq: Once | ORAL | Status: AC
  Administered 2013-11-11: 400 mg via ORAL
  Filled 2013-11-11: qty 2

## 2013-11-11 MED ORDER — SODIUM CHLORIDE 0.9 % IV SOLN
INTRAVENOUS | Status: DC | PRN
  Administered 2013-11-11: 14:00:00 via INTRAVENOUS

## 2013-11-11 MED ORDER — BUTAMBEN-TETRACAINE-BENZOCAINE 2-2-14 % EX AERO
INHALATION_SPRAY | CUTANEOUS | Status: DC | PRN
  Administered 2013-11-11: 2 via TOPICAL

## 2013-11-11 MED ORDER — PROPOFOL 10 MG/ML IV BOLUS
INTRAVENOUS | Status: DC | PRN
  Administered 2013-11-11 (×3): 20 mg via INTRAVENOUS

## 2013-11-11 MED ORDER — FENTANYL CITRATE 0.05 MG/ML IJ SOLN
INTRAMUSCULAR | Status: DC | PRN
  Administered 2013-11-11: 50 ug via INTRAVENOUS

## 2013-11-11 MED ORDER — AMIODARONE HCL 200 MG PO TABS: 200.0000 mg | ORAL_TABLET | Freq: Two times a day (BID) | ORAL | Status: DC

## 2013-11-11 MED ORDER — AMIODARONE HCL 400 MG PO TABS: ORAL_TABLET | ORAL | Status: DC

## 2013-11-11 NOTE — Anesthesia Postprocedure Evaluation (Signed)
  Anesthesia Post-op Note  Patient: Kimberly [name]  Procedure(s) Performed: Procedure(s): TRANSESOPHAGEAL ECHOCARDIOGRAM (TEE) (N/A) CARDIOVERSION (N/A)  Patient Location: Endoscopy Unit  Anesthesia Type:MAC  Level of Consciousness: awake, alert , oriented and patient cooperative  Airway and Oxygen Therapy: Patient Spontanous Breathing and Patient connected to nasal cannula oxygen  Post-op Pain: none  Post-op Assessment: Post-op Vital signs reviewed, Patient's Cardiovascular Status Stable, Respiratory Function Stable, Patent Airway and No signs of Nausea or vomiting  Post-op Vital Signs: Reviewed and stable  Last Vitals:  Filed Vitals:   11/11/13 1155  BP: 120/37  Temp: 36.4 C  Resp: 16    Complications: No apparent anesthesia complications

## 2013-11-11 NOTE — H&P (View-Only) (Signed)
Patient ID: Kimberly [name], female   DOB: 01-Aug-1955, 58 y.o.   MRN: [id number] PCP: Dr. Gery Pray [facility])  58 yo with history of persistent atrial fibrillation, obesity, and nonischemic cardiomyopathy presents for cardiology followup.  Patient was admitted in 3/15 at [facility] with atrial fibrillation and CHF.  TEE was done in preparation for DCCV, showing EF 25-30% but there was LA thrombus so DCCV was not done.  Plan was for anticoagulation x 3 months followed by TEE-guided DCCV in the OR (due to patient's body habitus).  LHC was also done, showing mild nonobstructive CAD.  Patient was diuresed with IV Lasix in the hospital and started on apixaban.   Patient remains in atrial fibrillation.  HR is controlled.  Weight is up 3 lbs.  She feels like she is retaining some fluid.  She has lower extremity edema.   She is walking with a cane without dyspnea on flat ground.   Main limitation to walking is knee pain.  No orthopnea or PND.  No chest pain.  No lightheadedness.   ECG: atrial fibrillation at 99 bpm with aberrantly conducted complexes    Labs (4/15): HCT 35.3 Labs (5/15): K 3.9, creatinine 1.0 Labs (6/15): K 4.3, creatinine 1.0, BNP 217  PMH: 1. Obese 2. Atrial fibrillation: Persistent.  Unable to cardiovert in 3/15 due to LAA thrombus noted on TEE. 3. H/o TKR 4. Nonischemic cardiomyopathy: TEE (3/15) with EF 25-30%, moderate MR, PA systolic pressure 51 mmHg, moderate to severe TR, LA thrombus was noted.   LHC (3/15) with 50% stenosis small PLV. 5. CAD: Nonobstructive.  LHC (3/15) with 50% stenosis small PLV.  SH: Married, lives in [place], nonsmoker  FH: No premature CAD or cardiomyopathy.  ROS: All systems reviewed and negative except as per HPI.   Current Outpatient Prescriptions  Medication Sig Dispense Refill  . acetaminophen (TYLENOL) 325 MG tablet Take 325 mg by mouth every 6 (six) hours as needed for moderate pain.      [name] apixaban (ELIQUIS) 5 MG TABS tablet Take 1 tablet  (5 mg total) by mouth 2 (two) times daily.  60 tablet  2  . Cholecalciferol (VITAMIN D-3) 5000 UNITS TABS Take 5,000 Units by mouth daily.      [name] docusate sodium (COLACE) 100 MG capsule Take 100 mg by mouth daily as needed for mild constipation.      . Ferrous Sulfate (PX IRON) 27 MG TABS Take 1 tablet by mouth daily.      . furosemide (LASIX) 40 MG tablet Take 1.5 tablets (60 mg total) by mouth 2 (two) times daily.  90 tablet  3  . lisinopril (PRINIVIL,ZESTRIL) 10 MG tablet Take 1 tablet (10 mg total) by mouth 2 (two) times daily.  60 tablet  6  . metoprolol succinate (TOPROL-XL) 50 MG 24 hr tablet Take 1 & 1/2 tabs Twice daily Take with or immediately following a meal.  90 tablet  3  . nitroGLYCERIN (NITROSTAT) 0.4 MG SL tablet Place 0.4 mg under the tongue every 5 (five) minutes as needed for chest pain.      . pravastatin (PRAVACHOL) 40 MG tablet Take 40 mg by mouth daily.      [name] spironolactone (ALDACTONE) 25 MG tablet Take 0.5 tablets (12.5 mg total) by mouth daily.  15 tablet  3   No current facility-administered medications for this encounter.    BP 118/62  Pulse 90  Wt 351 lb 8 oz (159.439 kg)  SpO2 98% General: NAD, obese  Neck: JVP 8-9 cm, no thyromegaly or thyroid nodule.  Lungs: Clear to auscultation bilaterally with normal respiratory effort. CV: Nondisplaced PMI.  Heart irregular S1/S2, no S3/S4, 1/6 SEM RUSB.  1+ ankle edema.  No carotid bruit.  Normal pedal pulses.  Abdomen: Soft, nontender, no hepatosplenomegaly, no distention.  Skin: Intact without lesions or rashes.  Neurologic: Alert and oriented x 3.  Psych: Normal affect. Extremities: No clubbing or cyanosis.   Assessment/Plan: 1. Chronic systolic CHF: Nonischemic cardiomyopathy.  EF 25-30% on TEE in 3/15.  LHC without significant coronary disease in 3/15.  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  Patient still appears to have some volume overload and weight is up.  - Increase Lasix to 60 mg bid.  - Add  spironolactone 12.5 mg daily. - Increase Toprol XL to 75 mg bid.  - BMET in 10 days.  2. Atrial fibrillation: Persistent.  She is in atrial fibrillation today.  As above, concern for tachy-mediated cardiomyopathy so would like to keep in NSR.  She had an LAA thrombus on TEE in 3/15.  Since then, she has been on Eliquis without missing a dose.  Will repeat TEE (in OR given body habitus and complication risk) in the next couple of weeks.  If no thrombus, will plan DCCV.   3. CAD: Mild, nonobstructive by cath.  Continue pravastatin.  No ASA given Eliquis use.    Followup in 1 month.  [name] 10/26/2013

## 2013-11-11 NOTE — Discharge Instructions (Signed)
Electrical Cardioversion, Care After Refer to this sheet in the next few weeks. These instructions provide you with information on caring for yourself after your procedure. Your health care provider may also give you more specific instructions. Your treatment has been planned according to current medical practices, but problems sometimes occur. Call your health care provider if you have any problems or questions after your procedure. WHAT TO EXPECT AFTER THE PROCEDURE After your procedure, it is typical to have the following sensations:  Some redness on the skin where the shocks were delivered. If this is tender, a sunburn lotion or hydrocortisone cream may help.  Possible return of an abnormal heart rhythm within hours or days after the procedure. HOME CARE INSTRUCTIONS  Only take medicine as directed by your health care provider. Be sure you understand how and when to take your medicine.  Learn how to feel your pulse and check it often.  Limit your activity for 48 hours after the procedure or as directed.  Avoid or minimize caffeine and other stimulants as directed. SEEK MEDICAL CARE IF:  You feel like your heart is beating too fast or your pulse is not regular.  You have any questions about your medicines.  You have bleeding that will not stop. SEEK IMMEDIATE MEDICAL CARE IF:  You are dizzy or feel faint.  It is hard to breathe or you feel short of breath.  There is a change in discomfort in your chest.  Your speech is slurred or you have trouble moving an arm or leg on one side of your body.  You get a serious muscle cramp that does not go away.  Your fingers or toes turn cold or blue. MAKE SURE YOU:   Understand these instructions.   Will watch your condition.   Will get help right away if you are not doing well or get worse. Document Released: 02/23/2013 Document Reviewed: 02/23/2013 [facility] Patient Information 2015 [place], [place]. This information is not  intended to replace advice given to you by your health care provider. Make sure you discuss any questions you have with your health care provider. Transesophageal Echocardiogram Transesophageal echocardiography (TEE) is a picture test of your heart using sound waves. The pictures taken can give very detailed pictures of your heart. This can help your doctor see if there are problems with your heart. TEE can check:  If your heart has blood clots in it.  How well your heart valves are working.  If you have an infection on the inside of your heart.  Some of the major arteries of your heart.  If your heart valve is working after a Office [employer].  Your heart before a procedure that uses a shock to your heart to get the rhythm back to normal. BEFORE THE PROCEDURE  Do not eat or drink for 6 hours before the procedure or as told by your doctor.  Make plans to have someone drive you home after the procedure. Do not drive yourself home.  An IV tube will be put in your arm. PROCEDURE  You will be given a medicine to help you relax (sedative). It will be given through the IV tube.  A numbing medicine will be sprayed or gargled in the back of your throat to help numb it.  The tip of the probe is placed into the back of your mouth. You will be asked to swallow. This helps to pass the probe into your esophagus.  Once the tip of the probe is in the  right place, your doctor can take pictures of your heart.  You may feel pressure at the back of your throat. AFTER THE PROCEDURE  You will be taken to a recovery area so the sedative can wear off.  Your throat may be sore and scratchy. This will go away slowly over time.  You will go home when you are fully awake and able to swallow liquids.  You should have someone stay with you for the next 24 hours.  Do not drive or operate machinery for the next 24 hours. Document Released: 03/02/2009 Document Revised: 05/10/2013 Document Reviewed:  11/04/2012 [facility] Patient Information 2015 [place], [place]. This information is not intended to replace advice given to you by your health care provider. Make sure you discuss any questions you have with your health care provider.

## 2013-11-11 NOTE — Transfer of Care (Signed)
Immediate Anesthesia Transfer of Care Note  Patient: Kimberly [name]  Procedure(s) Performed: Procedure(s): TRANSESOPHAGEAL ECHOCARDIOGRAM (TEE) (N/A) CARDIOVERSION (N/A)  Patient Location: PACU  Anesthesia Type:MAC  Level of Consciousness: awake, alert , oriented and patient cooperative  Airway & Oxygen Therapy: Patient Spontanous Breathing and Patient connected to nasal cannula oxygen  Post-op Assessment: Report given to PACU RN, Post -op Vital signs reviewed and stable and Patient moving all extremities  Post vital signs: Reviewed and stable  Complications: No apparent anesthesia complications

## 2013-11-11 NOTE — Anesthesia Preprocedure Evaluation (Signed)
Anesthesia Evaluation  Patient identified by MRN, date of birth, ID band Patient awake    Reviewed: Allergy & Precautions, H&P , NPO status , Patient's Chart, lab work & pertinent test results  History of Anesthesia Complications (+) PONV  Airway       Dental   Pulmonary          Cardiovascular hypertension, +CHF + dysrhythmias Atrial Fibrillation     Neuro/Psych    GI/Hepatic   Endo/Other  diabetes, Type 2Morbid obesity  Renal/GU      Musculoskeletal   Abdominal   Peds  Hematology   Anesthesia Other Findings   Reproductive/Obstetrics                           Anesthesia Physical Anesthesia Plan  ASA: IV  Anesthesia Plan: MAC and General   Post-op Pain Management:    Induction: Intravenous  Airway Management Planned: Mask  Additional Equipment:   Intra-op Plan:   Post-operative Plan:   Informed Consent: I have reviewed the patients History and Physical, chart, labs and discussed the procedure including the risks, benefits and alternatives for the proposed anesthesia with the patient or authorized representative who has indicated his/her understanding and acceptance.     Plan Discussed with:   Anesthesia Plan Comments:         Anesthesia Quick Evaluation

## 2013-11-11 NOTE — CV Procedure (Signed)
Procedure: TEE  Indication: Atrial fibrillation, history of LA thrombus.  Sedation: Propofol per anesthesiology  Findings: See echo section for full report.  Normal LV size with EF 35-40%, diffuse hypokinesis.  Mild mitral regurgitation.  Moderate TR.  Mild to moderate LAE with no LA thrombus.  There was a PFO noted by color doppler.  Normal RV size and systolic function.   May proceed to DCCV.   [name] 11/11/2013 1:57 PM

## 2013-11-11 NOTE — Interval H&P Note (Signed)
History and Physical Interval Note:  11/11/2013 1:32 PM  Kimberly [name]  has presented today for surgery, with the diagnosis of AFIB  The various methods of treatment have been discussed with the patient and family. After consideration of risks, benefits and other options for treatment, the patient has consented to  Procedure(s): TRANSESOPHAGEAL ECHOCARDIOGRAM (TEE) (N/A) CARDIOVERSION (N/A) as a surgical intervention .  The patient's history has been reviewed, patient examined, no change in status, stable for surgery.  I have reviewed the patient's chart and labs.  Questions were answered to the patient's satisfaction.     Corran Lalone [name]

## 2013-11-11 NOTE — Procedures (Addendum)
Electrical Cardioversion Procedure Note Kimberly [name] [id number] 11-10-1955  Procedure: Electrical Cardioversion Indications:  Atrial Fibrillation  Procedure Details Consent: Risks of procedure as well as the alternatives and risks of each were explained to the (patient/caregiver).  Consent for procedure obtained. Time Out: Verified patient identification, verified procedure, site/side was marked, verified correct patient position, special equipment/implants available, medications/allergies/relevent history reviewed, required imaging and test results available.  Performed  Patient placed on cardiac monitor, pulse oximetry, supplemental oxygen as necessary.  Sedation given: Propofol per anesthesiology Pacer pads placed anterior and posterior chest.  Cardioverted 3 time(s).  Cardioverted at 200J with sternal pressure.  Evaluation Findings: Post procedure EKG shows: Atrial Fibrillation. Complications: None Patient did tolerate procedure well. Will load amiodarone and try DCCV again in a couple of weeks.   [name] 11/11/2013, 1:57 PM  Patient was in NSR when she arrived back in her room. I still think maintenance of NSR is going to be difficult so will start amiodarone as above and have her back in the office in 2 wk as long as she stays in NSR.   [name] 11/11/2013 2:17 PM

## 2013-11-11 NOTE — Telephone Encounter (Signed)
Per Dr. Aundra Dubin patient need follow up appointment in 2 weeks.

## 2013-11-11 NOTE — Progress Notes (Signed)
  Echocardiogram 2D Echocardiogram has been performed.  Basilia Jumbo 11/11/2013, 1:54 PM

## 2013-11-14 ENCOUNTER — Encounter (HOSPITAL_COMMUNITY): Payer: Self-pay | Admitting: Cardiology

## 2013-11-14 NOTE — Telephone Encounter (Signed)
Pt is already sch for post dccv appt on 7/14

## 2013-11-15 ENCOUNTER — Telehealth (HOSPITAL_COMMUNITY): Payer: Self-pay | Admitting: Vascular Surgery

## 2013-11-15 NOTE — Telephone Encounter (Signed)
Pt returning a call she got from [place] yesterday evening.

## 2013-11-18 ENCOUNTER — Telehealth (HOSPITAL_COMMUNITY): Payer: Self-pay | Admitting: Anesthesiology

## 2013-11-18 MED ORDER — PRAVASTATIN SODIUM 80 MG PO TABS: 80.0000 mg | ORAL_TABLET | Freq: Every day | ORAL | Status: DC

## 2013-11-18 NOTE — Telephone Encounter (Signed)
Called patient about lab work and would like to increase her pravastatin to 80 mg daily. Will repeat LFTs and lipids in 2 months. She is aware and will send new Rx to pharmacy.  Junie Bame B NP-C 9:35 AM

## 2013-11-23 NOTE — Telephone Encounter (Signed)
Pt was given lab results 

## 2013-11-29 ENCOUNTER — Ambulatory Visit (HOSPITAL_COMMUNITY)
Admission: RE | Admit: 2013-11-29 | Discharge: 2013-11-29 | Disposition: A | Discharge status: Home / Self Care | Payer: Medicare Other | Source: Ambulatory Visit | Attending: Cardiology | Admitting: Cardiology

## 2013-11-29 ENCOUNTER — Encounter (HOSPITAL_COMMUNITY): Payer: Self-pay

## 2013-11-29 ENCOUNTER — Encounter (HOSPITAL_COMMUNITY): Payer: Self-pay | Admitting: *Deleted

## 2013-11-29 VITALS — BP 112/68 | HR 67 | Wt 350.1 lb

## 2013-11-29 DIAGNOSIS — I4891 Unspecified atrial fibrillation: Secondary | ICD-10-CM

## 2013-11-29 DIAGNOSIS — I2510 Atherosclerotic heart disease of native coronary artery without angina pectoris: Secondary | ICD-10-CM | POA: Diagnosis not present

## 2013-11-29 DIAGNOSIS — Q211 Atrial septal defect: Secondary | ICD-10-CM | POA: Insufficient documentation

## 2013-11-29 DIAGNOSIS — E785 Hyperlipidemia, unspecified: Secondary | ICD-10-CM

## 2013-11-29 DIAGNOSIS — I428 Other cardiomyopathies: Secondary | ICD-10-CM | POA: Diagnosis present

## 2013-11-29 DIAGNOSIS — E669 Obesity, unspecified: Secondary | ICD-10-CM | POA: Diagnosis not present

## 2013-11-29 DIAGNOSIS — I4949 Other premature depolarization: Secondary | ICD-10-CM | POA: Insufficient documentation

## 2013-11-29 DIAGNOSIS — Z09 Encounter for follow-up examination after completed treatment for conditions other than malignant neoplasm: Secondary | ICD-10-CM | POA: Diagnosis not present

## 2013-11-29 DIAGNOSIS — Q2111 Secundum atrial septal defect: Secondary | ICD-10-CM | POA: Insufficient documentation

## 2013-11-29 DIAGNOSIS — I509 Heart failure, unspecified: Secondary | ICD-10-CM

## 2013-11-29 DIAGNOSIS — Z79899 Other long term (current) drug therapy: Secondary | ICD-10-CM | POA: Insufficient documentation

## 2013-11-29 DIAGNOSIS — I5022 Chronic systolic (congestive) heart failure: Secondary | ICD-10-CM | POA: Diagnosis not present

## 2013-11-29 LAB — HEPATIC FUNCTION PANEL
ALT: 13 U/L (ref 0–35)
AST: 16 U/L (ref 0–37)
Albumin: 3.8 g/dL (ref 3.5–5.2)
Alkaline Phosphatase: 83 U/L (ref 39–117)
Bilirubin, Direct: <0.2 mg/dL (ref 0.0–0.3)
Total Bilirubin: 0.4 mg/dL (ref 0.3–1.2)
Total Protein: 7.8 g/dL (ref 6.0–8.3)

## 2013-11-29 LAB — TSH: TSH: 3.08 u[IU]/mL (ref 0.350–4.500)

## 2013-11-29 NOTE — Patient Instructions (Signed)
Labs today  Your physician has recommended that you have a Cardioversion (DCCV). Electrical Cardioversion uses a jolt of electricity to your heart either through paddles or wired patches attached to your chest. This is a controlled, usually prescheduled, procedure. Defibrillation is done under light anesthesia in the hospital, and you usually go home the day of the procedure. This is done to get your heart back into a normal rhythm. You are not awake for the procedure. Please see the instruction sheet given to you today.  Your physician recommends that you schedule a follow-up appointment in: 3 weeks

## 2013-11-29 NOTE — Progress Notes (Signed)
Patient ID: Kimberly [name], female   DOB: [birth year], 58 y.o.   MRN: [id number] PCP: Dr. Gery Pray [facility])  58 yo with history of persistent atrial fibrillation, obesity, and nonischemic cardiomyopathy presents for cardiology followup.  Patient was admitted in 3/15 at [facility] with atrial fibrillation and CHF.  TEE was done in preparation for DCCV, showing EF 25-30% but there was LA thrombus so DCCV was not done.  Plan was for anticoagulation x 3 months followed by TEE-guided DCCV in the OR (due to patient's body habitus).  LHC was also done, showing mild nonobstructive CAD.  Patient was diuresed with IV Lasix in the hospital and started on apixaban.   Patient had TEE again in 6/15, showing EF 35-40% with diffuse hypokinesis and no LAA thrombus.  She was cardioverted to NSR (with difficulty).  I started her at that time on amiodarone.  Today, she is in atrial fibrillation.  She cannot tell when she went back into atrial fibrillation.  Weight is down 1 lb.  She is able to walk on flat ground without dyspnea.  She has dyspnea with stairs and inclines.  No lightheadedness, no chest pain, no orthopnea/PND.   ECG: atrial fibrillation at 87 bpm with aberrantly conducted complexes    Labs (4/15): HCT 35.3 Labs (5/15): K 3.9, creatinine 1.0 Labs (6/15): K 4.3, creatinine 1.0, BNP 217, LDL 106  PMH: 1. Obese 2. Atrial fibrillation: Persistent.  Unable to cardiovert in 3/15 due to LAA thrombus noted on TEE. TEE in 6/15 without LAA thrombus.  Patient had cardioversion to NSR with difficulty but back in atrial fibrillation by 7/15 appt.  She was started on amiodarone with plan to re-attempt cardioversion.  3. H/o TKR 4. Nonischemic cardiomyopathy: TEE (3/15) with EF 25-30%, moderate MR, PA systolic pressure 51 mmHg, moderate to severe TR, LA thrombus was noted.   LHC (3/15) with 50% stenosis small PLV.  TEE (6/15) with EF 35-40%, diffuse hypokinesis, mild MR, moderate TR, +PFO, no LA appendage thrombus.  5.  CAD: Nonobstructive.  LHC (3/15) with 50% stenosis small PLV. 6. PFO  SH: Married, lives in [place], nonsmoker  FH: No premature CAD or cardiomyopathy.  ROS: All systems reviewed and negative except as per HPI.   Current Outpatient Prescriptions  Medication Sig Dispense Refill  . acetaminophen (TYLENOL) 325 MG tablet Take 325 mg by mouth every 6 (six) hours as needed for moderate pain.      [name] amiodarone (PACERONE) 400 MG tablet Take 400 mg BID for 10 days and then decrease to 400 mg daily.  40 tablet  3  . apixaban (ELIQUIS) 5 MG TABS tablet Take 1 tablet (5 mg total) by mouth 2 (two) times daily.  60 tablet  0  . Cholecalciferol (VITAMIN D-3) 5000 UNITS TABS Take 5,000 Units by mouth daily.      [name] docusate sodium (COLACE) 100 MG capsule Take 100 mg by mouth daily as needed for mild constipation.      . Ferrous Sulfate (PX IRON) 27 MG TABS Take 1 tablet by mouth daily.      . furosemide (LASIX) 40 MG tablet Take 1.5 tablets (60 mg total) by mouth 2 (two) times daily.  90 tablet  3  . lisinopril (PRINIVIL,ZESTRIL) 10 MG tablet Take 1 tablet (10 mg total) by mouth 2 (two) times daily.  60 tablet  6  . metoprolol succinate (TOPROL-XL) 50 MG 24 hr tablet Take 50 mg by mouth 2 (two) times daily. Take with or immediately  following a meal.      . nitroGLYCERIN (NITROSTAT) 0.4 MG SL tablet Place 0.4 mg under the tongue every 5 (five) minutes as needed for chest pain.      . Omega-3 Fatty Acids (FISH OIL PO) Take 1 capsule by mouth 2 (two) times daily.      . pravastatin (PRAVACHOL) 80 MG tablet Take 1 tablet (80 mg total) by mouth daily.  30 tablet  6  . spironolactone (ALDACTONE) 25 MG tablet Take 0.5 tablets (12.5 mg total) by mouth daily.  15 tablet  3   No current facility-administered medications for this encounter.    BP 112/68  Pulse 67  Wt 350 lb 1.9 oz (158.813 kg)  SpO2 97% General: NAD, obese Neck: JVP 8 cm, no thyromegaly or thyroid nodule.  Lungs: Clear to auscultation  bilaterally with normal respiratory effort. CV: Nondisplaced PMI.  Heart irregular S1/S2, no S3/S4, 1/6 SEM RUSB.  No edema.  No carotid bruit.  Normal pedal pulses.  Abdomen: Soft, nontender, no hepatosplenomegaly, no distention.  Skin: Intact without lesions or rashes.  Neurologic: Alert and oriented x 3.  Psych: Normal affect. Extremities: No clubbing or cyanosis.   Assessment/Plan: 1. Chronic systolic CHF: Nonischemic cardiomyopathy.  EF 35-40% on TEE in 6/15, some improvement.  LHC without significant coronary disease in 3/15.  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  Volume status is improved. - Continue current Lasix.   - Continue lisinopril, Toprol XL, and spironolactone at current doses.  2. Atrial fibrillation: Persistent, recurred after 6/15 cardioversion.  She is in atrial fibrillation today.  As above, concern for tachy-mediated cardiomyopathy so would like to keep in NSR.  She had an LAA thrombus on TEE in 3/15 but not in 6/15.  She has been on Eliquis without missing a dose.  She has been loaded with amiodarone.  - Repeat DCCV on amiodarone - Check LFTs/TSH today, will need yearly eye exam while on amiodarone.  3. CAD: Mild, nonobstructive by cath.  No ASA given Eliquis use.   4. Hyperlipidemia: Pravastatin recently increased to 80 mg daily.  Lipids/LFTs in 8/15.   Followup in 3 weeks  [name] 11/29/2013

## 2013-11-30 ENCOUNTER — Other Ambulatory Visit: Payer: Self-pay | Admitting: *Deleted

## 2013-11-30 NOTE — Addendum Note (Signed)
Encounter addended by: Cori Razor, CCT on: 11/30/2013  9:02 AM<BR>     Documentation filed: Charges VN

## 2013-12-01 ENCOUNTER — Encounter (HOSPITAL_COMMUNITY): Payer: Self-pay | Admitting: Pharmacy Technician

## 2013-12-02 ENCOUNTER — Telehealth (HOSPITAL_COMMUNITY): Payer: Self-pay | Admitting: Cardiology

## 2013-12-02 NOTE — Telephone Encounter (Signed)
Pt scheduled for DCCV on 12/08/2013 Cpt code 92960 icd 9427.31 With pts current insurance- Medicare No pre cert req'd

## 2013-12-08 ENCOUNTER — Ambulatory Visit (HOSPITAL_COMMUNITY)
Admission: RE | Admit: 2013-12-08 | Discharge: 2013-12-08 | Disposition: A | Discharge status: Home / Self Care | Payer: Medicare Other | Source: Ambulatory Visit | Attending: Cardiology | Admitting: Cardiology

## 2013-12-08 ENCOUNTER — Encounter (HOSPITAL_COMMUNITY)
Admission: RE | Disposition: A | Discharge status: Home / Self Care | Payer: Self-pay | Source: Ambulatory Visit | Attending: Cardiology

## 2013-12-08 ENCOUNTER — Encounter (HOSPITAL_COMMUNITY): Payer: Self-pay | Admitting: Certified Registered Nurse Anesthetist

## 2013-12-08 ENCOUNTER — Ambulatory Visit (HOSPITAL_COMMUNITY): Payer: Medicare Other | Admitting: Certified Registered Nurse Anesthetist

## 2013-12-08 ENCOUNTER — Encounter (HOSPITAL_COMMUNITY): Payer: Medicare Other | Admitting: Certified Registered Nurse Anesthetist

## 2013-12-08 DIAGNOSIS — I428 Other cardiomyopathies: Secondary | ICD-10-CM | POA: Diagnosis not present

## 2013-12-08 DIAGNOSIS — Q2111 Secundum atrial septal defect: Secondary | ICD-10-CM | POA: Diagnosis not present

## 2013-12-08 DIAGNOSIS — I10 Essential (primary) hypertension: Secondary | ICD-10-CM | POA: Diagnosis not present

## 2013-12-08 DIAGNOSIS — Z79899 Other long term (current) drug therapy: Secondary | ICD-10-CM | POA: Insufficient documentation

## 2013-12-08 DIAGNOSIS — Z7901 Long term (current) use of anticoagulants: Secondary | ICD-10-CM | POA: Diagnosis not present

## 2013-12-08 DIAGNOSIS — I509 Heart failure, unspecified: Secondary | ICD-10-CM | POA: Diagnosis not present

## 2013-12-08 DIAGNOSIS — I2510 Atherosclerotic heart disease of native coronary artery without angina pectoris: Secondary | ICD-10-CM | POA: Insufficient documentation

## 2013-12-08 DIAGNOSIS — I359 Nonrheumatic aortic valve disorder, unspecified: Secondary | ICD-10-CM | POA: Diagnosis not present

## 2013-12-08 DIAGNOSIS — I4891 Unspecified atrial fibrillation: Secondary | ICD-10-CM | POA: Insufficient documentation

## 2013-12-08 DIAGNOSIS — E785 Hyperlipidemia, unspecified: Secondary | ICD-10-CM | POA: Insufficient documentation

## 2013-12-08 DIAGNOSIS — Q211 Atrial septal defect: Secondary | ICD-10-CM | POA: Insufficient documentation

## 2013-12-08 DIAGNOSIS — E119 Type 2 diabetes mellitus without complications: Secondary | ICD-10-CM | POA: Diagnosis not present

## 2013-12-08 DIAGNOSIS — I5022 Chronic systolic (congestive) heart failure: Secondary | ICD-10-CM | POA: Insufficient documentation

## 2013-12-08 DIAGNOSIS — G4730 Sleep apnea, unspecified: Secondary | ICD-10-CM | POA: Insufficient documentation

## 2013-12-08 HISTORY — PX: CARDIOVERSION: SHX1299

## 2013-12-08 LAB — PROTIME-INR
INR: 1.28 (ref 0.00–1.49)
Prothrombin Time: 16 seconds — ABNORMAL HIGH (ref 11.6–15.2)

## 2013-12-08 LAB — BASIC METABOLIC PANEL
Anion gap: 13 (ref 5–15)
BUN: 16 mg/dL (ref 6–23)
CO2: 25 mEq/L (ref 19–32)
Calcium: 8.9 mg/dL (ref 8.4–10.5)
Chloride: 104 mEq/L (ref 96–112)
Creatinine, Ser: 1.24 mg/dL — ABNORMAL HIGH (ref 0.50–1.10)
GFR calc Af Amer: 55 mL/min — ABNORMAL LOW (ref >90)
GFR calc non Af Amer: 47 mL/min — ABNORMAL LOW (ref >90)
Glucose, Bld: 100 mg/dL — ABNORMAL HIGH (ref 70–99)
Potassium: 4.5 mEq/L (ref 3.7–5.3)
Sodium: 142 mEq/L (ref 137–147)

## 2013-12-08 LAB — CBC WITH DIFFERENTIAL/PLATELET
Basophils Absolute: 0 10*3/uL (ref 0.0–0.1)
Basophils Relative: 0 % (ref 0–1)
Eosinophils Absolute: 0.1 10*3/uL (ref 0.0–0.7)
Eosinophils Relative: 2 % (ref 0–5)
HCT: 35.2 % — ABNORMAL LOW (ref 36.0–46.0)
Hemoglobin: 11.7 g/dL — ABNORMAL LOW (ref 12.0–15.0)
Lymphocytes Relative: 34 % (ref 12–46)
Lymphs Abs: 1.7 10*3/uL (ref 0.7–4.0)
MCH: 29.5 pg (ref 26.0–34.0)
MCHC: 33.2 g/dL (ref 30.0–36.0)
MCV: 88.7 fL (ref 78.0–100.0)
Monocytes Absolute: 0.6 10*3/uL (ref 0.1–1.0)
Monocytes Relative: 12 % (ref 3–12)
Neutro Abs: 2.6 10*3/uL (ref 1.7–7.7)
Neutrophils Relative %: 52 % (ref 43–77)
Platelets: 203 10*3/uL (ref 150–400)
RBC: 3.97 MIL/uL (ref 3.87–5.11)
RDW: 14.6 % (ref 11.5–15.5)
WBC: 5 10*3/uL (ref 4.0–10.5)

## 2013-12-08 SURGERY — CARDIOVERSION
Anesthesia: General

## 2013-12-08 MED ORDER — SODIUM CHLORIDE 0.9 % IV SOLN
INTRAVENOUS | Status: DC | PRN
  Administered 2013-12-08: 12:00:00 via INTRAVENOUS

## 2013-12-08 MED ORDER — SODIUM CHLORIDE 0.9 % IV SOLN: INTRAVENOUS | Status: DC

## 2013-12-08 MED ORDER — PROPOFOL 10 MG/ML IV BOLUS
INTRAVENOUS | Status: DC | PRN
  Administered 2013-12-08: 110 mg via INTRAVENOUS

## 2013-12-08 NOTE — H&P (View-Only) (Signed)
Patient ID: Kimberly [name], female   DOB: 18-Dec-1955, 58 y.o.   MRN: [id number] PCP: Dr. Gery [facility])  58 yo with history of persistent atrial fibrillation, obesity, and nonischemic cardiomyopathy presents for cardiology followup.  Patient was admitted in 3/15 at [facility] with atrial fibrillation and CHF.  TEE was done in preparation for DCCV, showing EF 25-30% but there was LA thrombus so DCCV was not done.  Plan was for anticoagulation x 3 months followed by TEE-guided DCCV in the OR (due to patient's body habitus).  LHC was also done, showing mild nonobstructive CAD.  Patient was diuresed with IV Lasix in the hospital and started on apixaban.   Patient had TEE again in 6/15, showing EF 35-40% with diffuse hypokinesis and no LAA thrombus.  She was cardioverted to NSR (with difficulty).  I started her at that time on amiodarone.  Today, she is in atrial fibrillation.  She cannot tell when she went back into atrial fibrillation.  Weight is down 1 lb.  She is able to walk on flat ground without dyspnea.  She has dyspnea with stairs and inclines.  No lightheadedness, no chest pain, no orthopnea/PND.   ECG: atrial fibrillation at 87 bpm with aberrantly conducted complexes    Labs (4/15): HCT 35.3 Labs (5/15): K 3.9, creatinine 1.0 Labs (6/15): K 4.3, creatinine 1.0, BNP 217, LDL 106  PMH: 1. Obese 2. Atrial fibrillation: Persistent.  Unable to cardiovert in 3/15 due to LAA thrombus noted on TEE. TEE in 6/15 without LAA thrombus.  Patient had cardioversion to NSR with difficulty but back in atrial fibrillation by 7/15 appt.  She was started on amiodarone with plan to re-attempt cardioversion.  3. H/o TKR 4. Nonischemic cardiomyopathy: TEE (3/15) with EF 25-30%, moderate MR, PA systolic pressure 51 mmHg, moderate to severe TR, LA thrombus was noted.   LHC (3/15) with 50% stenosis small PLV.  TEE (6/15) with EF 35-40%, diffuse hypokinesis, mild MR, moderate TR, +PFO, no LA appendage thrombus.  5.  CAD: Nonobstructive.  LHC (3/15) with 50% stenosis small PLV. 6. PFO  SH: Married, lives in [place], nonsmoker  FH: No premature CAD or cardiomyopathy.  ROS: All systems reviewed and negative except as per HPI.   Current Outpatient Prescriptions  Medication Sig Dispense Refill  . acetaminophen (TYLENOL) 325 MG tablet Take 325 mg by mouth every 6 (six) hours as needed for moderate pain.      [name] amiodarone (PACERONE) 400 MG tablet Take 400 mg BID for 10 days and then decrease to 400 mg daily.  40 tablet  3  . apixaban (ELIQUIS) 5 MG TABS tablet Take 1 tablet (5 mg total) by mouth 2 (two) times daily.  60 tablet  0  . Cholecalciferol (VITAMIN D-3) 5000 UNITS TABS Take 5,000 Units by mouth daily.      [name] docusate sodium (COLACE) 100 MG capsule Take 100 mg by mouth daily as needed for mild constipation.      . Ferrous Sulfate (PX IRON) 27 MG TABS Take 1 tablet by mouth daily.      . furosemide (LASIX) 40 MG tablet Take 1.5 tablets (60 mg total) by mouth 2 (two) times daily.  90 tablet  3  . lisinopril (PRINIVIL,ZESTRIL) 10 MG tablet Take 1 tablet (10 mg total) by mouth 2 (two) times daily.  60 tablet  6  . metoprolol succinate (TOPROL-XL) 50 MG 24 hr tablet Take 50 mg by mouth 2 (two) times daily. Take with or immediately  following a meal.      . nitroGLYCERIN (NITROSTAT) 0.4 MG SL tablet Place 0.4 mg under the tongue every 5 (five) minutes as needed for chest pain.      . Omega-3 Fatty Acids (FISH OIL PO) Take 1 capsule by mouth 2 (two) times daily.      . pravastatin (PRAVACHOL) 80 MG tablet Take 1 tablet (80 mg total) by mouth daily.  30 tablet  6  . spironolactone (ALDACTONE) 25 MG tablet Take 0.5 tablets (12.5 mg total) by mouth daily.  15 tablet  3   No current facility-administered medications for this encounter.    BP 112/68  Pulse 67  Wt 350 lb 1.9 oz (158.813 kg)  SpO2 97% General: NAD, obese Neck: JVP 8 cm, no thyromegaly or thyroid nodule.  Lungs: Clear to auscultation  bilaterally with normal respiratory effort. CV: Nondisplaced PMI.  Heart irregular S1/S2, no S3/S4, 1/6 SEM RUSB.  No edema.  No carotid bruit.  Normal pedal pulses.  Abdomen: Soft, nontender, no hepatosplenomegaly, no distention.  Skin: Intact without lesions or rashes.  Neurologic: Alert and oriented x 3.  Psych: Normal affect. Extremities: No clubbing or cyanosis.   Assessment/Plan: 1. Chronic systolic CHF: Nonischemic cardiomyopathy.  EF 35-40% on TEE in 6/15, some improvement.  LHC without significant coronary disease in 3/15.  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  Volume status is improved. - Continue current Lasix.   - Continue lisinopril, Toprol XL, and spironolactone at current doses.  2. Atrial fibrillation: Persistent, recurred after 6/15 cardioversion.  She is in atrial fibrillation today.  As above, concern for tachy-mediated cardiomyopathy so would like to keep in NSR.  She had an LAA thrombus on TEE in 3/15 but not in 6/15.  She has been on Eliquis without missing a dose.  She has been loaded with amiodarone.  - Repeat DCCV on amiodarone - Check LFTs/TSH today, will need yearly eye exam while on amiodarone.  3. CAD: Mild, nonobstructive by cath.  No ASA given Eliquis use.   4. Hyperlipidemia: Pravastatin recently increased to 80 mg daily.  Lipids/LFTs in 8/15.   Followup in 3 weeks  Kimberly [name] 11/29/2013

## 2013-12-08 NOTE — Anesthesia Postprocedure Evaluation (Signed)
  Anesthesia Post-op Note  Patient: Kimberly [name]  Procedure(s) Performed: Procedure(s): CARDIOVERSION (N/A)  Patient Location: Endoscopy Unit  Anesthesia Type:MAC  Level of Consciousness: awake, alert , oriented and patient cooperative  Airway and Oxygen Therapy: Patient Spontanous Breathing and Patient connected to nasal cannula oxygen  Post-op Pain: none  Post-op Assessment: Post-op Vital signs reviewed, Patient's Cardiovascular Status Stable, Respiratory Function Stable, Patent Airway and No signs of Nausea or vomiting  Post-op Vital Signs: Reviewed and stable  Last Vitals:  Filed Vitals:   12/08/13 1141  BP: 123/57  Pulse:   Temp:   Resp: 16    Complications: No apparent anesthesia complications

## 2013-12-08 NOTE — Transfer of Care (Signed)
Immediate Anesthesia Transfer of Care Note  Patient: Kimberly [name]  Procedure(s) Performed: Procedure(s): CARDIOVERSION (N/A)  Patient Location: Endoscopy Unit  Anesthesia Type:MAC  Level of Consciousness: awake, alert , oriented and patient cooperative  Airway & Oxygen Therapy: Patient Spontanous Breathing and Patient connected to nasal cannula oxygen  Post-op Assessment: Report given to PACU RN, Post -op Vital signs reviewed and stable and Patient moving all extremities X 4  Post vital signs: Reviewed and stable  Complications: No apparent anesthesia complications

## 2013-12-08 NOTE — Discharge Instructions (Signed)
Electrical Cardioversion, Care After Refer to this sheet in the next few weeks. These instructions provide you with information on caring for yourself after your procedure. Your health care provider may also give you more specific instructions. Your treatment has been planned according to current medical practices, but problems sometimes occur. Call your health care provider if you have any problems or questions after your procedure. WHAT TO EXPECT AFTER THE PROCEDURE After your procedure, it is typical to have the following sensations:  Some redness on the skin where the shocks were delivered. If this is tender, a sunburn lotion or hydrocortisone cream may help.  Possible return of an abnormal heart rhythm within hours or days after the procedure. HOME CARE INSTRUCTIONS  Take medicines only as directed by your health care provider. Be sure you understand how and when to take your medicine.  Learn how to feel your pulse and check it often.  Limit your activity for 48 hours after the procedure or as directed by your health care provider.  Avoid or minimize caffeine and other stimulants as directed by your health care provider. SEEK MEDICAL CARE IF:  You feel like your heart is beating too fast or your pulse is not regular.  You have any questions about your medicines.  You have bleeding that will not stop. SEEK IMMEDIATE MEDICAL CARE IF:  You are dizzy or feel faint.  It is hard to breathe or you feel short of breath.  There is a change in discomfort in your chest.  Your speech is slurred or you have trouble moving an arm or leg on one side of your body.  You get a serious muscle cramp that does not go away.  Your fingers or toes turn cold or blue. Document Released: 02/23/2013 Document Revised: 09/19/2013 Document Reviewed: 02/23/2013 [facility] Patient Information 2015 [place], [place]. This information is not intended to replace advice given to you by your health care provider.  Make sure you discuss any questions you have with your health care provider.   Amcinonide topical creams, ointments, and lotions What is this medicine? AMCINONIDE is a corticosteroid. It is used on the skin to reduce swelling, redness, itching, and allergic reactions. This medicine may be used for other purposes; ask your health care provider or pharmacist if you have questions. COMMON BRAND NAME(S): Cyclocort What should I tell my health care provider before I take this medicine? They need to know if you have any of these conditions: -any active infection -diabetes -large areas of burned or damaged skin -skin wasting or thinning -an unusual or allergic reaction to amcinonide, steroids, other medicines, foods, dyes, or preservatives -pregnant or trying to get pregnant -breast-feeding How should I use this medicine? This medicine is for external use only. Do not take by mouth. Follow the directions on the prescription label. Wash your hands before and after use. Apply a thin film to the affected area and rub in gently. Do not cover with a bandage or dressing unless your doctor or health care professional tells you to. Do not use on healthy skin or over large areas of skin. Do not get this medicine in your eyes. If you do, rinse out with plenty of cool tap water. It is important not to use more medicine than prescribed. Do not use your medicine more often than directed. Talk to your pediatrician regarding the use of this medicine in children. While this drug may be prescribed for selected conditions, precautions do apply. If applying this medicine to  the diaper area of a child, do not cover with tight-fitting diapers or plastic pants. This may increase the amount of medicine that passes through the skin and increase the risk of serious side effects. Patients over 55 years old are more likely to have damaged skin through aging, and this may increase side effects. This medicine should only be used  for brief periods and infrequently in older patients. Overdosage: If you think you've taken too much of this medicine contact a poison control center or emergency room at once. Overdosage: If you think you have taken too much of this medicine contact a poison control center or emergency room at once. NOTE: This medicine is only for you. Do not share this medicine with others. What if I miss a dose? If you miss a dose, use it as soon as you can. If it is almost time for your next dose, use only that dose. Do not use double or extra doses. What may interact with this medicine? Interactions are not expected. Do not use any other skin products on the affected area without asking your doctor or health care professional. This list may not describe all possible interactions. Give your health care provider a list of all the medicines, herbs, non-prescription drugs, or dietary supplements you use. Also tell them if you smoke, drink alcohol, or use illegal drugs. Some items may interact with your medicine. What should I watch for while using this medicine? Tell your doctor or healthcare professional if your symptoms do not start to get better or if they get worse. Tell your doctor or health care professional if you are exposed to anyone with measles or chickenpox, or if you develop sores or blisters that do not heal properly. What side effects may I notice from receiving this medicine? Side effects that you should report to your doctor or health care professional as soon as possible: -allergic reactions like skin rash, itching or hives, swelling of the face, lips, or tongue) -burning or itching of the skin -dark red spots on the skin -infection -lack of healing of skin condition -painful, red, pus filled blisters in hair follicles -thinning of the skin, increased sensitivity to the sun especially on the face Side effects that usually do not require medical attention (Report these to your doctor or health  care professional if they continue or are bothersome.): -dry skin -unusual increased growth of hair on the face or body This list may not describe all possible side effects. Call your doctor for medical advice about side effects. You may report side effects to FDA at 1-800-FDA-1088. Where should I keep my medicine? Keep out of the reach of children. Store at room temperature between 15 and 30 degrees C (59 and 86 degrees C). Do not freeze. Throw away any unused medicine after the expiration date. NOTE: This sheet is a summary. It may not cover all possible information. If you have questions about this medicine, talk to your doctor, pharmacist, or health care provider.  2015, [employer]vier/[employer] Standard. (2009-08-03 12:01:10)

## 2013-12-08 NOTE — Interval H&P Note (Signed)
History and Physical Interval Note:  12/08/2013 11:49 AM  Kimberly [name]  has presented today for surgery, with the diagnosis of AFIB  The various methods of treatment have been discussed with the patient and family. After consideration of risks, benefits and other options for treatment, the patient has consented to  Procedure(s): CARDIOVERSION (N/A) as a surgical intervention .  The patient's history has been reviewed, patient examined, no change in status, stable for surgery.  I have reviewed the patient's chart and labs.  Questions were answered to the patient's satisfaction.     Kathyrn Warmuth [name]

## 2013-12-08 NOTE — Procedures (Signed)
Electrical Cardioversion Procedure Note Sahalie Beth [id number] [birth year]  Procedure: Electrical Cardioversion Indications:  Atrial Fibrillation  Procedure Details Consent: Risks of procedure as well as the alternatives and risks of each were explained to the (patient/caregiver).  Consent for procedure obtained. Time Out: Verified patient identification, verified procedure, site/side was marked, verified correct patient position, special equipment/implants available, medications/allergies/relevent history reviewed, required imaging and test results available.  Performed  Patient placed on cardiac monitor, pulse oximetry, supplemental oxygen as necessary.  Sedation given: Propofol per anesthesiology Pacer pads placed anterior and posterior chest.  Cardioverted 1 time(s).  Cardioverted at [place].  Evaluation Findings: Post procedure EKG shows: NSR Complications: None Patient did tolerate procedure well.   [name] 12/08/2013, 11:52 AM

## 2013-12-08 NOTE — Anesthesia Preprocedure Evaluation (Addendum)
Anesthesia Evaluation  Patient identified by MRN, date of birth, ID band Patient awake    Reviewed: Allergy & Precautions, H&P , NPO status , Patient's Chart, lab work & pertinent test results, reviewed documented beta blocker date and time   History of Anesthesia Complications (+) PONV  Airway       Dental   Pulmonary sleep apnea ,          Cardiovascular hypertension, Pt. on medications and Pt. on home beta blockers +CHF + dysrhythmias Atrial Fibrillation IV+ Valvular Problems/Murmurs AS     Neuro/Psych    GI/Hepatic   Endo/Other  diabetes, Type 2Morbid obesity  Renal/GU      Musculoskeletal   Abdominal   Peds  Hematology   Anesthesia Other Findings L atrial thrombus EF 25%  Reproductive/Obstetrics                          Anesthesia Physical Anesthesia Plan  ASA: III  Anesthesia Plan: General   Post-op Pain Management:    Induction: Intravenous  Airway Management Planned: Mask  Additional Equipment:   Intra-op Plan:   Post-operative Plan:   Informed Consent: I have reviewed the patients History and Physical, chart, labs and discussed the procedure including the risks, benefits and alternatives for the proposed anesthesia with the patient or authorized representative who has indicated his/her understanding and acceptance.   Dental advisory given  Plan Discussed with:   Anesthesia Plan Comments:         Anesthesia Quick Evaluation

## 2013-12-09 ENCOUNTER — Encounter (HOSPITAL_COMMUNITY): Payer: Self-pay | Admitting: Cardiology

## 2013-12-09 ENCOUNTER — Other Ambulatory Visit: Payer: Self-pay | Admitting: Cardiology

## 2013-12-22 ENCOUNTER — Ambulatory Visit (HOSPITAL_COMMUNITY)
Admission: RE | Admit: 2013-12-22 | Discharge: 2013-12-22 | Disposition: A | Discharge status: Home / Self Care | Payer: Medicare Other | Source: Ambulatory Visit | Attending: Cardiology | Admitting: Cardiology

## 2013-12-22 ENCOUNTER — Encounter (HOSPITAL_COMMUNITY): Payer: Self-pay

## 2013-12-22 VITALS — BP 106/80 | HR 66 | Wt 346.8 lb

## 2013-12-22 DIAGNOSIS — I2510 Atherosclerotic heart disease of native coronary artery without angina pectoris: Secondary | ICD-10-CM | POA: Diagnosis not present

## 2013-12-22 DIAGNOSIS — E785 Hyperlipidemia, unspecified: Secondary | ICD-10-CM | POA: Diagnosis not present

## 2013-12-22 DIAGNOSIS — I4891 Unspecified atrial fibrillation: Secondary | ICD-10-CM | POA: Insufficient documentation

## 2013-12-22 DIAGNOSIS — E669 Obesity, unspecified: Secondary | ICD-10-CM | POA: Insufficient documentation

## 2013-12-22 DIAGNOSIS — I5022 Chronic systolic (congestive) heart failure: Secondary | ICD-10-CM | POA: Diagnosis not present

## 2013-12-22 DIAGNOSIS — I509 Heart failure, unspecified: Secondary | ICD-10-CM

## 2013-12-22 DIAGNOSIS — I428 Other cardiomyopathies: Secondary | ICD-10-CM | POA: Insufficient documentation

## 2013-12-22 MED ORDER — LISINOPRIL 10 MG PO TABS: 10.0000 mg | ORAL_TABLET | Freq: Two times a day (BID) | ORAL | Status: DC

## 2013-12-22 NOTE — Patient Instructions (Signed)
You have been referred to Dr Rayann Heman  Your physician recommends that you schedule a follow-up appointment in: 2 months with labs (cmet, tsh)

## 2013-12-22 NOTE — Progress Notes (Signed)
Patient ID: Kimberly [name], female   DOB: 1956/04/14, 58 y.o.   MRN: [id number] PCP: Dr. Gery [facility])  58 yo with history of persistent atrial fibrillation, obesity, and nonischemic cardiomyopathy presents for cardiology followup.  Patient was admitted in 3/15 at [facility] with atrial fibrillation and CHF.  TEE was done in preparation for DCCV, showing EF 25-30% but there was LA thrombus so DCCV was not done.  Plan was for anticoagulation x 3 months followed by TEE-guided DCCV in the OR (due to patient's body habitus).  LHC was also done, showing mild nonobstructive CAD.  Patient was diuresed with IV Lasix in the hospital and started on apixaban.   Patient had TEE again in 6/15, showing EF 35-40% with diffuse hypokinesis and no LAA thrombus.  She was cardioverted to NSR (with difficulty).  I started her at that time on amiodarone.  At last appointment, she was back in atrial fibrillation.  After she had been loaded on amiodarone, I cardioverted her again in 7/15.  She went back into NSR easily this time but is again back in atrial fibrillation today.  She cannot tell when she went back into atrial fibrillation.  Weight is down 4 lbs.  She is able to walk on flat ground without dyspnea.  She has dyspnea with stairs and inclines.  No lightheadedness, no chest pain, no orthopnea/PND.   ECG: atrial fibrillation with PVCs   Labs (4/15): HCT 35.3 Labs (5/15): K 3.9, creatinine 1.0 Labs (6/15): K 4.3, creatinine 1.0, BNP 217, LDL 106 Labs (7/15): K 4.5, creatinine 1.24, LFTs normal, TSH normal  PMH: 1. Obese 2. Atrial fibrillation: Persistent.  Unable to cardiovert in 3/15 due to LAA thrombus noted on TEE. TEE in 6/15 without LAA thrombus.  Patient had cardioversion to NSR with difficulty but back in atrial fibrillation by 7/15 appt.  She was started on amiodarone and cardioverted in 7/15, but was back in atrial fibrillation by 8/15 despite amiodarone use.  3. H/o TKR 4. Nonischemic cardiomyopathy:  TEE (3/15) with EF 25-30%, moderate MR, PA systolic pressure 51 mmHg, moderate to severe TR, LA thrombus was noted.   LHC (3/15) with 50% stenosis small PLV.  TEE (6/15) with EF 35-40%, diffuse hypokinesis, mild MR, moderate TR, +PFO, no LA appendage thrombus.  5. CAD: Nonobstructive.  LHC (3/15) with 50% stenosis small PLV. 6. PFO  SH: Married, lives in [place], nonsmoker  FH: No premature CAD or cardiomyopathy.  ROS: All systems reviewed and negative except as per HPI.   Current Outpatient Prescriptions  Medication Sig Dispense Refill  . acetaminophen (TYLENOL) 325 MG tablet Take 325 mg by mouth every 6 (six) hours as needed for moderate pain.      [name] amiodarone (PACERONE) 200 MG tablet Take 200 mg by mouth 2 (two) times daily.      . Cholecalciferol (VITAMIN D-3) 5000 UNITS TABS Take 5,000 Units by mouth daily.      [name] docusate sodium (COLACE) 100 MG capsule Take 100 mg by mouth daily as needed for mild constipation.      [name] ELIQUIS 5 MG TABS tablet TAKE 1 TABLET (5 MG TOTAL) BY MOUTH 2 (TWO) TIMES DAILY.  60 tablet  2  . Ferrous Sulfate (PX IRON) 27 MG TABS Take 1 tablet by mouth daily.      . furosemide (LASIX) 40 MG tablet Take 1.5 tablets (60 mg total) by mouth 2 (two) times daily.  90 tablet  3  . lisinopril (PRINIVIL,ZESTRIL) 10 MG  tablet Take 1 tablet (10 mg total) by mouth 2 (two) times daily.  60 tablet  6  . metoprolol succinate (TOPROL-XL) 50 MG 24 hr tablet Take 50 mg by mouth 2 (two) times daily. Take with or immediately following a meal.      . nitroGLYCERIN (NITROSTAT) 0.4 MG SL tablet Place 0.4 mg under the tongue every 5 (five) minutes as needed for chest pain.      . Omega-3 Fatty Acids (FISH OIL) 600 MG CAPS Take 600 Units by mouth 2 (two) times daily.      . pravastatin (PRAVACHOL) 80 MG tablet Take 1 tablet (80 mg total) by mouth daily.  30 tablet  6  . spironolactone (ALDACTONE) 25 MG tablet Take 0.5 tablets (12.5 mg total) by mouth daily.  15 tablet  3   No current  facility-administered medications for this encounter.    BP 106/80  Pulse 66  Wt 346 lb 12.8 oz (157.307 kg)  SpO2 96% General: NAD, obese Neck: JVP 7 cm, no thyromegaly or thyroid nodule.  Lungs: Clear to auscultation bilaterally with normal respiratory effort. CV: Nondisplaced PMI.  Heart irregular S1/S2, no S3/S4, 1/6 SEM RUSB.  No edema.  No carotid bruit.  Normal pedal pulses.  Abdomen: Soft, nontender, no hepatosplenomegaly, no distention.  Skin: Intact without lesions or rashes.  Neurologic: Alert and oriented x 3.  Psych: Normal affect. Extremities: No clubbing or cyanosis.   Assessment/Plan: 1. Chronic systolic CHF: Nonischemic cardiomyopathy.  EF 35-40% on TEE in 6/15, some improvement.  LHC without significant coronary disease in 3/15.  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  Volume status is improved. - Continue current Lasix.   - Continue lisinopril, Toprol XL, and spironolactone at current doses (soft blood pressure).  2. Atrial fibrillation:  This has recurred twice now after cardioversion, the second time while on amiodarone.  She is in atrial fibrillation today.  As above, concern for tachy-mediated cardiomyopathy so would like to keep in NSR.  She had an LAA thrombus on TEE in 3/15 but not in 6/15.   - I am going to refer her to Dr Rayann Heman for consideration of atrial fibrillation ablation.  - Check LFTs/TSH in 2 months, will need yearly eye exam while on amiodarone. I will decrease amiodarone to 200 mg daily.  I am going to keep her on amiodarone for now as it may help keep her in NSR if she has atrial fibrillation ablation.  3. CAD: Mild, nonobstructive by cath.  No ASA given Eliquis use.   4. Hyperlipidemia: Pravastatin recently increased to 80 mg daily.  Lipids/LFTs at next appointment.    [name] 12/22/2013

## 2013-12-23 NOTE — Addendum Note (Signed)
Encounter addended by: Bonne Dolores, NT on: 12/23/2013  8:35 AM<BR>     Documentation filed: Charges VN

## 2014-01-17 ENCOUNTER — Other Ambulatory Visit: Payer: Self-pay

## 2014-01-18 ENCOUNTER — Ambulatory Visit (INDEPENDENT_AMBULATORY_CARE_PROVIDER_SITE_OTHER): Payer: Medicare Other | Admitting: Internal Medicine

## 2014-01-18 ENCOUNTER — Encounter: Payer: Self-pay | Admitting: Internal Medicine

## 2014-01-18 VITALS — BP 132/74 | HR 57 | Ht 66.0 in | Wt 346.8 lb

## 2014-01-18 DIAGNOSIS — I509 Heart failure, unspecified: Secondary | ICD-10-CM

## 2014-01-18 DIAGNOSIS — I5022 Chronic systolic (congestive) heart failure: Secondary | ICD-10-CM

## 2014-01-18 DIAGNOSIS — I4891 Unspecified atrial fibrillation: Secondary | ICD-10-CM

## 2014-01-18 NOTE — Progress Notes (Signed)
Primary Care Physician: Omer Jack, MD Referring Physician:  Dr Mila Homer is a 58 y.o. female with a h/o persistent atrial fibrillation, obesity, and nonischemic cardiomyopathy presents for electrophysiology evaluation .[name] Patient was admitted in 3/15 at [facility] with atrial fibrillation and CHF. TEE was done in preparation for DCCV, showing EF 25-30% but there was LA thrombus so DCCV was not done. Plan was for anticoagulation x 3 months followed by TEE-guided DCCV. LHC was also done, showing mild nonobstructive CAD. Patient was diuresed with IV Lasix in the hospital and started on apixaban. Chadsvasc score of at least 3.  Patient had TEE again in 6/15, showing EF 35-40% with diffuse hypokinesis and no LAA thrombus. She was cardioverted to NSR (with difficulty). She was started  on amiodarone, however she returned to atrial fibrillation. After she had been loaded on amiodarone, she was cardioverted  again in 7/15. She went back into NSR easily  but returned back to afib in a matter of days. She cannot tell when she went back into atrial fibrillation, she is asymptomatic in it.. Weight is down per patient about 30 pounds since March of this year. She is able to walk on flat ground without dyspnea.Denies smoking history, alcohol use and snoring.   Today, she denies symptoms of palpitations, chest pain, shortness of breath, orthopnea, PND, lower extremity edema, dizziness, presyncope, syncope, or neurologic sequela. The patient is tolerating medications without difficulties and is otherwise without complaint today.   Past Medical History  Diagnosis Date  . Aortic stenosis   . Complication of anesthesia   . PONV (postoperative nausea and vomiting)   . Atrial fibrillation   . NICM (nonischemic cardiomyopathy)     EF 30%  . Arthritis     KNEES  . Morbid obesity   . HLD (hyperlipidemia)   . Diabetes   . Left atrial thrombus    Past Surgical History  Procedure  Laterality Date  . Joint replacement    . Tee without cardioversion N/A 07/29/2013    Procedure: TRANSESOPHAGEAL ECHOCARDIOGRAM (TEE);  Surgeon: [name], MD;  Location: [place];  Service: Cardiovascular;  Laterality: N/A;  . Cardioversion N/A 07/29/2013    Procedure: CARDIOVERSION;  Surgeon: [name], MD;  Location: [facility] ENDOSCOPY;  Service: Cardiovascular;  Laterality: N/A;  . Tee without cardioversion N/A 11/11/2013    Procedure: TRANSESOPHAGEAL ECHOCARDIOGRAM (TEE);  Surgeon: [name], MD;  Location: [place];  Service: Cardiovascular;  Laterality: N/A;  . Cardioversion N/A 11/11/2013    Procedure: CARDIOVERSION;  Surgeon: [name], MD;  Location: [place];  Service: Cardiovascular;  Laterality: N/A;  . Cardioversion N/A 12/08/2013    Procedure: CARDIOVERSION;  Surgeon: [name], MD;  Location: [facility] ENDOSCOPY;  Service: Cardiovascular;  Laterality: N/A;    Current Outpatient Prescriptions  Medication Sig Dispense Refill  . acetaminophen (TYLENOL) 325 MG tablet Take 325 mg by mouth every 6 (six) hours as needed for moderate pain.      [name] amiodarone (PACERONE) 200 MG tablet Take 200 mg by mouth 2 (two) times daily.      . Cholecalciferol (VITAMIN D-3) 5000 UNITS TABS Take 5,000 Units by mouth daily.      [name] docusate sodium (COLACE) 100 MG capsule Take 100 mg by mouth at bedtime.       [name] ELIQUIS 5 MG TABS tablet TAKE 1 TABLET (5 MG TOTAL) BY MOUTH 2 (TWO) TIMES DAILY.  60 tablet  2  . Ferrous Sulfate (PX IRON) 27 MG TABS Take 1 tablet by mouth daily.      . furosemide (LASIX) 40 MG tablet Take 1.5 tablets (60 mg total) by mouth 2 (two) times daily.  90 tablet  3  . lisinopril (PRINIVIL,ZESTRIL) 10 MG tablet Take 1 tablet (10 mg total) by mouth 2 (two) times daily.  60 tablet  6  . metoprolol succinate (TOPROL-XL) 50 MG 24 hr tablet Take 50 mg by mouth 2 (two) times daily. Take with or immediately following a meal.      . nitroGLYCERIN (NITROSTAT) 0.4  MG SL tablet Place 0.4 mg under the tongue every 5 (five) minutes as needed for chest pain.      . Omega-3 Fatty Acids (FISH OIL) 600 MG CAPS Take 600 Units by mouth 2 (two) times daily.      . pravastatin (PRAVACHOL) 80 MG tablet Take 1 tablet (80 mg total) by mouth daily.  30 tablet  6  . spironolactone (ALDACTONE) 25 MG tablet Take 0.5 tablets (12.5 mg total) by mouth daily.  15 tablet  3   No current facility-administered medications for this visit.    No Known Allergies  History   Social History  . Marital Status: Single    Spouse Name: N/A    Number of Children: N/A  . Years of Education: N/A   Occupational History  . Not on file.   Social History Main Topics  . Smoking status: Never Smoker   . Smokeless tobacco: Never Used  . Alcohol Use: No  . Drug Use: No  . Sexual Activity: Not Currently   Other Topics Concern  . Not on file   Social History Narrative  . No narrative on file    Family History  Problem Relation Age of Onset  . CVA Mother     ROS- All systems are reviewed and negative except as per the HPI above  Physical Exam: Filed Vitals:   01/18/14 0852  BP: 132/74  Pulse: 57  Height: 5\' 6"  (1.676 m)  Weight: 157.307 kg (346 lb 12.8 oz)    GEN- The patient is well appearing, alert and oriented x 3 today.   Head- normocephalic, atraumatic Eyes-  Sclera clear, conjunctiva pink Ears- hearing intact Oropharynx- clear Neck- supple, no JVP Lymph- no cervical lymphadenopathy Lungs- Clear to ausculation bilaterally, normal work of breathing Heart- Regular rate and rhythm, no murmurs, rubs or gallops, PMI not laterally displaced GI- soft, NT, ND, + BS Extremities- no clubbing, cyanosis, or edema MS- no significant deformity or atrophy Skin- no rash or lesion Psych- euthymic mood, full affect Neuro- strength and sensation are intact  EKG- Sinus brady with PC's at 57 bpm with QTC of 421ms.  [place] (3/15) with EF 25-30%, moderate MR, PA systolic  pressure 51 mmHg, moderate to severe TR, LA thrombus was noted.  TEE (6/15) with EF 35-40%, diffuse hypokinesis, mild MR, moderate TR, +PFO, no LA appendage thrombus.    LHC- (3/15) with 50% stenosis small PLV.    Assessment and Plan: 1. Persistent Afib- now in SR on Amiodarone 400 mg a day. Will continue this dose for now . Amiodarone level today. Patient would not currently be a good candidate for afib ablation due to morbid obesity. Have encouraged pt to lose weight and if can get below 300 lbs, procedure likely to have higher success rate.  2.NICM Stable Continue ace, bb, diuretic  3. HTN Well managed today. No changes  4.  Morbid obesity Encouraged continued weight lose and regular exercise. Body mass index is 56 kg/(m^2).     I have seen, examined the patient, and reviewed the above assessment and plan with Roderic Palau NP.  Changes to above are made where necessary.  The patient has converted to sinus rhythm.  I think that we should continue to follow on amiodarone for now.  She will continue to work on weight reduction strategies.  She will follow-up with Roderic Palau NP.    Co Sign: Thompson Grayer, MD 01/18/2014 4:46 PM

## 2014-01-18 NOTE — Patient Instructions (Signed)
Your physician recommends that you schedule a follow-up appointment in: 4 weeks with Roderic Palau, NP  Your physician recommends that you return for lab work today: Amiodarone level

## 2014-01-20 LAB — AMIODARONE LEVEL
Amiodarone Lvl: 0.3 ug/mL — ABNORMAL LOW (ref 1.5–2.5)
Desethylamiodarone: 0.4 ug/mL — ABNORMAL LOW (ref 1.5–2.5)

## 2014-02-13 ENCOUNTER — Other Ambulatory Visit (HOSPITAL_COMMUNITY): Payer: Self-pay | Admitting: Cardiology

## 2014-02-19 ENCOUNTER — Other Ambulatory Visit: Payer: Self-pay | Admitting: Cardiology

## 2014-02-19 DIAGNOSIS — I4891 Unspecified atrial fibrillation: Secondary | ICD-10-CM

## 2014-02-21 ENCOUNTER — Encounter: Payer: Self-pay | Admitting: Licensed Clinical Social Worker

## 2014-02-21 ENCOUNTER — Ambulatory Visit (HOSPITAL_COMMUNITY)
Admission: RE | Admit: 2014-02-21 | Discharge: 2014-02-21 | Disposition: A | Discharge status: Home / Self Care | Payer: Medicare Other | Source: Ambulatory Visit | Attending: Internal Medicine | Admitting: Internal Medicine

## 2014-02-21 ENCOUNTER — Ambulatory Visit (HOSPITAL_BASED_OUTPATIENT_CLINIC_OR_DEPARTMENT_OTHER)
Admission: RE | Admit: 2014-02-21 | Discharge: 2014-02-21 | Disposition: A | Discharge status: Home / Self Care | Payer: Medicare Other | Source: Ambulatory Visit | Attending: Nurse Practitioner | Admitting: Nurse Practitioner

## 2014-02-21 ENCOUNTER — Encounter (HOSPITAL_COMMUNITY): Payer: Self-pay

## 2014-02-21 ENCOUNTER — Encounter (HOSPITAL_COMMUNITY): Payer: Self-pay | Admitting: Nurse Practitioner

## 2014-02-21 VITALS — BP 112/60 | HR 62 | Wt 352.0 lb

## 2014-02-21 VITALS — BP 112/58 | HR 76 | Wt 352.5 lb

## 2014-02-21 DIAGNOSIS — I4819 Other persistent atrial fibrillation: Secondary | ICD-10-CM

## 2014-02-21 DIAGNOSIS — I4891 Unspecified atrial fibrillation: Secondary | ICD-10-CM | POA: Diagnosis not present

## 2014-02-21 DIAGNOSIS — I428 Other cardiomyopathies: Secondary | ICD-10-CM

## 2014-02-21 DIAGNOSIS — Z7901 Long term (current) use of anticoagulants: Secondary | ICD-10-CM | POA: Insufficient documentation

## 2014-02-21 DIAGNOSIS — E785 Hyperlipidemia, unspecified: Secondary | ICD-10-CM | POA: Insufficient documentation

## 2014-02-21 DIAGNOSIS — Z6841 Body Mass Index (BMI) 40.0 and over, adult: Secondary | ICD-10-CM

## 2014-02-21 DIAGNOSIS — I481 Persistent atrial fibrillation: Secondary | ICD-10-CM | POA: Insufficient documentation

## 2014-02-21 DIAGNOSIS — E119 Type 2 diabetes mellitus without complications: Secondary | ICD-10-CM | POA: Insufficient documentation

## 2014-02-21 DIAGNOSIS — I513 Intracardiac thrombosis, not elsewhere classified: Secondary | ICD-10-CM | POA: Diagnosis not present

## 2014-02-21 DIAGNOSIS — I5022 Chronic systolic (congestive) heart failure: Secondary | ICD-10-CM

## 2014-02-21 DIAGNOSIS — M13862 Other specified arthritis, left knee: Secondary | ICD-10-CM

## 2014-02-21 DIAGNOSIS — I2510 Atherosclerotic heart disease of native coronary artery without angina pectoris: Secondary | ICD-10-CM | POA: Diagnosis not present

## 2014-02-21 DIAGNOSIS — I10 Essential (primary) hypertension: Secondary | ICD-10-CM

## 2014-02-21 DIAGNOSIS — Z96659 Presence of unspecified artificial knee joint: Secondary | ICD-10-CM | POA: Diagnosis not present

## 2014-02-21 DIAGNOSIS — Z79899 Other long term (current) drug therapy: Secondary | ICD-10-CM | POA: Insufficient documentation

## 2014-02-21 DIAGNOSIS — M13861 Other specified arthritis, right knee: Secondary | ICD-10-CM | POA: Insufficient documentation

## 2014-02-21 DIAGNOSIS — I350 Nonrheumatic aortic (valve) stenosis: Secondary | ICD-10-CM | POA: Insufficient documentation

## 2014-02-21 DIAGNOSIS — Q211 Atrial septal defect: Secondary | ICD-10-CM | POA: Diagnosis not present

## 2014-02-21 MED ORDER — SPIRONOLACTONE 25 MG PO TABS: 12.5000 mg | ORAL_TABLET | Freq: Every day | ORAL | Status: DC

## 2014-02-21 MED ORDER — SPIRONOLACTONE 25 MG PO TABS: 25.0000 mg | ORAL_TABLET | Freq: Every day | ORAL | Status: DC

## 2014-02-21 NOTE — Patient Instructions (Addendum)
INCREASE Spironolactone to 25 mg daily  Your physician has requested that you have overnight oximetry. [facility] call [facility] to arrange setting this up.  Your physician has requested that you have an echocardiogram. Echocardiography is a painless test that uses sound waves to create images of your heart. It provides your doctor with information about the size and shape of your heart and how well your heart's chambers and valves are working. This procedure takes approximately one hour. There are no restrictions for this procedure.   Your physician recommends that you schedule a follow-up appointment in: 1 month  Please follow up next Tuesday for lab work   Do the following things EVERYDAY: 1) Weigh yourself in the morning before breakfast. Write it down and keep it in a log. 2) Take your medicines as prescribed 3) Eat low salt foods-Limit salt (sodium) to 2000 mg per day.  4) Stay as active as you can everyday 5) Limit all fluids for the day to less than 2 liters 6)

## 2014-02-21 NOTE — Progress Notes (Signed)
Patient referred to assist with medication assistance. CSW met with patient in the clinic to discuss monthly finances and options for medication assistance. Patient is over income for medicaid and already receiving extra help through Medicare. CSW discussed mail order program through Medicare D program which has been known to offer reduced prescription costs. Patient interested and CSW contacted [employer] to inquire. Patient will receive an application in the mail within one week and plans to return to [place] for further assistance with completing the application. CSW will continue to be available as needed. Raquel Sarna, [place]

## 2014-02-21 NOTE — Progress Notes (Signed)
Primary Care Physician: Omer Jack, MD Referring Physician:  Dr Mila Homer is a 58 y.o. female with a h/o persistent atrial fibrillation, obesity, and nonischemic cardiomyopathy presents for electrophysiology evaluation .Kimberly [name] Patient was admitted in 3/15 at [facility] with atrial fibrillation and CHF. TEE was done in preparation for DCCV, showing EF 25-30% but there was LA thrombus so DCCV was not done. Plan was for anticoagulation x 3 months followed by TEE-guided DCCV. LHC was also done, showing mild nonobstructive CAD. Patient was diuresed with IV Lasix in the hospital and started on apixaban. Chadsvasc score of at least 3.  Patient had TEE again in 6/15, showing EF 35-40% with diffuse hypokinesis and no LAA thrombus. She was cardioverted to NSR (with difficulty). She was started  on amiodarone, however she returned to atrial fibrillation. After she had been loaded on amiodarone, she was cardioverted  again in 7/15. She went back into NSR easily  but returned back to afib in a matter of days. She cannot tell when she went back into atrial fibrillation, she is asymptomatic in it.. Weight is down per patient about 30 pounds since March of this year. She is able to walk on flat ground without dyspnea.Denies smoking history, alcohol use and snoring.  Clinic visit today reveals pt in [place]. She is feeling will without any issues with irregular rhythm. Has noticed much less dyspnea. She continues on amiodarone 200 mg bid and had Amiodarone  level  TSH and liver lab work in July. She was seen in HF clinic this am and is scheduled for a sleep study [facility] She has not been able to exercise due to painful knees and has not obtained any weight loss.  Today, she denies symptoms of palpitations, chest pain, shortness of breath, orthopnea, PND, lower extremity edema, dizziness, presyncope, syncope, or neurologic sequela. The patient is tolerating medications  without difficulties and is otherwise without complaint today.  HV Past Medical History  Diagnosis Date  . Aortic stenosis   . Complication of anesthesia   . PONV (postoperative nausea and vomiting)   . Atrial fibrillation   . NICM (nonischemic cardiomyopathy)     EF 30%  . Arthritis     KNEES  . Morbid obesity   . HLD (hyperlipidemia)   . Diabetes   . Left atrial thrombus    Past Surgical History  Procedure Laterality Date  . Joint replacement    . Tee without cardioversion N/A 07/29/2013    Procedure: TRANSESOPHAGEAL ECHOCARDIOGRAM (TEE);  Surgeon: [name], MD;  Location: [place];  Service: Cardiovascular;  Laterality: N/A;  . Cardioversion N/A 07/29/2013    Procedure: CARDIOVERSION;  Surgeon: [name], MD;  Location: [facility] ENDOSCOPY;  Service: Cardiovascular;  Laterality: N/A;  . Tee without cardioversion N/A 11/11/2013    Procedure: TRANSESOPHAGEAL ECHOCARDIOGRAM (TEE);  Surgeon: [name], MD;  Location: [place];  Service: Cardiovascular;  Laterality: N/A;  . Cardioversion N/A 11/11/2013    Procedure: CARDIOVERSION;  Surgeon: [name], MD;  Location: [place];  Service: Cardiovascular;  Laterality: N/A;  . Cardioversion N/A 12/08/2013    Procedure: CARDIOVERSION;  Surgeon: [name], MD;  Location: [facility] ENDOSCOPY;  Service: Cardiovascular;  Laterality: N/A;    Current Outpatient Prescriptions  Medication Sig Dispense Refill  . acetaminophen (TYLENOL) 325 MG tablet Take 325 mg by mouth every 6 (six) hours as needed for moderate pain.      Kimberly [name]  amiodarone (PACERONE) 200 MG tablet Take 200 mg by mouth 2 (two) times daily.      . Cholecalciferol (VITAMIN D-3) 5000 UNITS TABS Take 5,000 Units by mouth daily.      Kimberly [name] docusate sodium (COLACE) 100 MG capsule Take 100 mg by mouth at bedtime.       Kimberly [name] ELIQUIS 5 MG TABS tablet TAKE 1 TABLET (5 MG TOTAL) BY MOUTH 2 (TWO) TIMES DAILY.  60 tablet  2  . Ferrous Sulfate (PX IRON) 27 MG TABS Take 1  tablet by mouth daily.      . furosemide (LASIX) 40 MG tablet TAKE 1 AND 1/2 TABLET BY MOUTH TWICE A DAY  90 tablet  3  . lisinopril (PRINIVIL,ZESTRIL) 10 MG tablet Take 1 tablet (10 mg total) by mouth 2 (two) times daily.  60 tablet  6  . metoprolol succinate (TOPROL-XL) 50 MG 24 hr tablet Take 50 mg by mouth 2 (two) times daily. Take with or immediately following a meal.      . nitroGLYCERIN (NITROSTAT) 0.4 MG SL tablet Place 0.4 mg under the tongue every 5 (five) minutes as needed for chest pain.      . Omega-3 Fatty Acids (FISH OIL) 600 MG CAPS Take 600 Units by mouth 2 (two) times daily.      . pravastatin (PRAVACHOL) 80 MG tablet Take 1 tablet (80 mg total) by mouth daily.  30 tablet  6  . spironolactone (ALDACTONE) 25 MG tablet Take 1 tablet (25 mg total) by mouth daily.  30 tablet  6   No current facility-administered medications for this encounter.    No Known Allergies  History   Social History  . Marital Status: Single    Spouse Name: N/A    Number of Children: N/A  . Years of Education: N/A   Occupational History  . Not on file.   Social History Main Topics  . Smoking status: Never Smoker   . Smokeless tobacco: Never Used  . Alcohol Use: No  . Drug Use: No  . Sexual Activity: Not Currently   Other Topics Concern  . Not on file   Social History Narrative  . No narrative on file    Family History  Problem Relation Age of Onset  . CVA Mother     ROS- All systems are reviewed and negative except as per the HPI above  Physical Exam: Filed Vitals:   02/21/14 1256  BP: 112/58  Pulse: 76  Weight: 352 lb 8 oz (159.893 kg)  SpO2: 94%    GEN- The patient is well appearing, alert and oriented x 3 today.   Head- normocephalic, atraumatic Eyes-  Sclera clear, conjunctiva pink Ears- hearing intact Oropharynx- clear Neck- supple, no JVP Lymph- no cervical lymphadenopathy Lungs- Clear to ausculation bilaterally, normal work of breathing Heart- Regular rate  and rhythm, no murmurs, rubs or gallops, PMI not laterally displaced GI- soft, NT, ND, + BS Extremities- no clubbing, cyanosis, or edema MS- no significant deformity or atrophy Skin- no rash or lesion Psych- euthymic mood, full affect Neuro- strength and sensation are intact  EKG- Sinus brady 52 bpm   with QTC of 478ms.  [place] (3/15) with EF 25-30%, moderate MR, PA systolic pressure 51 mmHg, moderate to severe TR, LA thrombus was noted.  TEE (6/15) with EF 35-40%, diffuse hypokinesis, mild MR, moderate TR, +PFO, no LA appendage thrombus.    LHC- (3/15) with 50% stenosis small PLV.    Assessment and Plan: 1.  Persistent Afib- now in SR on Amiodarone 400 mg a day. Will continue this dose for now . Amiodarone level low first of September. Patient would not currently be a good candidate for afib ablation due to morbid obesity. Have encouraged pt to lose weight and if can get below 300 lbs, procedure likely to have higher success rate.    2.NICM Stable Continue ace, bb, diuretic. Spironolactone increased today to 25 mg thru HF clinic.  3. HTN Well managed today. No changes  4. Morbid obesity Encouraged continued weight lose and regular exercise. Body mass index is 56.92 kg/(m^2).  Referred to free dietary class at [facility]. [facility] Overnight oximetry as ordered thru HF clinic.  5.CHADS2VA2Sc  Continue to reduce stroke risk No current issues with bleeding.  F/u in one month Consideration for reducing amio dose at that time F/u results of echo ordered today thru HF clinc

## 2014-02-21 NOTE — Patient Instructions (Signed)
Continue current medications  Your physician recommends that you schedule a follow-up appointment in: 1 month

## 2014-02-21 NOTE — Progress Notes (Signed)
Patient ID: Kimberly [name], female   DOB: Jul 22, 1955, 58 y.o.   MRN: [id number] PCP: Dr. Gery Pray [facility])  58 yo with history of persistent atrial fibrillation, obesity, and nonischemic cardiomyopathy presents for cardiology followup.  Patient was admitted in 3/15 at [facility] with atrial fibrillation and CHF.  TEE was done in preparation for DCCV, showing EF 25-30% but there was LA thrombus so DCCV was not done.  Plan was for anticoagulation x 3 months followed by TEE-guided DCCV in the OR (due to patient's body habitus).  LHC was also done, showing mild nonobstructive CAD.  Patient was diuresed with IV Lasix in the hospital and started on apixaban.   Patient had TEE again in 6/15, showing EF 35-40% with diffuse hypokinesis and no LAA thrombus.  She was cardioverted to NSR (with difficulty) and she was started on amiodarone.  Unfortunately she went back into A fib and she was again loaded on  Amiodarone and  cardioverted on  7/15.    She returns for follow up. She was evaluated by Dr Rayann Heman on September 2 and maintaining SR.  She was not felt to be an ablation candidate due to morbid obesity. She continued on Amiodarone 200 mg twice a day.  Says she is having hard time dealing with the loss of her fiance. Denies SOB/PND/Orthopnea. Weight 349-352 pounds at home. Not walking much. Not able to exercise much due joint pain. Having difficulty paying for medications.      ECG: Sinus Bradycardia 52 BPM   Labs (4/15): HCT 35.3 Labs (5/15): K 3.9, creatinine 1.0 Labs (6/15): K 4.3, creatinine 1.0, BNP 217, LDL 106 Labs (7/15): K 4.5, creatinine 1.24, LFTs normal, TSH normal   PMH: 1. Obese 2. Atrial fibrillation: Persistent.  Unable to cardiovert in 3/15 due to LAA thrombus noted on TEE. TEE in 6/15 without LAA thrombus.  Patient had cardioversion to NSR with difficulty but back in atrial fibrillation by 7/15 appt.  She was started on amiodarone and cardioverted in 7/15, but was back in atrial  fibrillation by 8/15 despite amiodarone use.  3. H/o TKR 4. Nonischemic cardiomyopathy: TEE (3/15) with EF 25-30%, moderate MR, PA systolic pressure 51 mmHg, moderate to severe TR, LA thrombus was noted.   LHC (3/15) with 50% stenosis small PLV.  TEE (6/15) with EF 35-40%, diffuse hypokinesis, mild MR, moderate TR, +PFO, no LA appendage thrombus.  5. CAD: Nonobstructive.  LHC (3/15) with 50% stenosis small PLV. 6. PFO  SH: Single, lives in [place], nonsmoker has 1 daughter   FH: No premature CAD or cardiomyopathy.  ROS: All systems reviewed and negative except as per HPI.   Current Outpatient Prescriptions  Medication Sig Dispense Refill  . acetaminophen (TYLENOL) 325 MG tablet Take 325 mg by mouth every 6 (six) hours as needed for moderate pain.      [name] amiodarone (PACERONE) 200 MG tablet Take 200 mg by mouth 2 (two) times daily.      . Cholecalciferol (VITAMIN D-3) 5000 UNITS TABS Take 5,000 Units by mouth daily.      [name] docusate sodium (COLACE) 100 MG capsule Take 100 mg by mouth at bedtime.       [name] ELIQUIS 5 MG TABS tablet TAKE 1 TABLET (5 MG TOTAL) BY MOUTH 2 (TWO) TIMES DAILY.  60 tablet  2  . Ferrous Sulfate (PX IRON) 27 MG TABS Take 1 tablet by mouth daily.      [name] lisinopril (PRINIVIL,ZESTRIL) 10 MG tablet Take 1 tablet (10  mg total) by mouth 2 (two) times daily.  60 tablet  6  . metoprolol succinate (TOPROL-XL) 50 MG 24 hr tablet Take 50 mg by mouth 2 (two) times daily. Take with or immediately following a meal.      . nitroGLYCERIN (NITROSTAT) 0.4 MG SL tablet Place 0.4 mg under the tongue every 5 (five) minutes as needed for chest pain.      . Omega-3 Fatty Acids (FISH OIL) 600 MG CAPS Take 600 Units by mouth 2 (two) times daily.      . pravastatin (PRAVACHOL) 80 MG tablet Take 1 tablet (80 mg total) by mouth daily.  30 tablet  6  . spironolactone (ALDACTONE) 25 MG tablet Take 0.5 tablets (12.5 mg total) by mouth daily.  15 tablet  3  . furosemide (LASIX) 40 MG tablet TAKE 1 AND  1/2 TABLET BY MOUTH TWICE A DAY  90 tablet  3   No current facility-administered medications for this encounter.    BP 112/60  Pulse 62  Wt 352 lb (159.666 kg)  SpO2 99% General: NAD, obese Neck: JVP 7 cm, no thyromegaly or thyroid nodule.  Lungs: Clear to auscultation bilaterally with normal respiratory effort. CV: Nondisplaced PMI.  Heart irregular S1/S2, no S3/S4, 1/6 SEM RUSB.  No edema.  No carotid bruit.  Normal pedal pulses.  Abdomen: Soft, nontender, no hepatosplenomegaly, no distention.  Skin: Intact without lesions or rashes.  Neurologic: Alert and oriented x 3.  Psych: Normal affect. Extremities: No clubbing or cyanosis.   Assessment/Plan: 1. Chronic systolic CHF: Nonischemic cardiomyopathy.  EF 35-40% on TEE in 6/15, some improvement.  LHC without significant coronary disease in 3/15.  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  Volume status is improved. - Continue current Lasix.   - Continue lisinopril,  And Toprol XL.  Increase spironolactone to 25 mg daily. Check BMET in 10 days.   Repeat ECHO Having trouble paying for medications.   2. Atrial fibrillation:  This has recurred twice now after cardioversion, the second time while on amiodarone. No maintainingg NSR.    She had an LAA thrombus on TEE in 3/15 but not in 6/15.   - Followed by Dr Rayann Heman in A fib clinic. Not a candidate for A fib ablation due to morbid obesity.   - Denies symptoms of sleep apnea such as snoring and day time fatigue.  Start with over night oximetry.  - Check LFTs/TSH in 2 months, will need yearly eye exam while on amiodarone.  3. CAD: Mild, nonobstructive by cath.  No ASA given Eliquis use.   4. Hyperlipidemia: Pravastatin recently increased to 80 mg daily.  Lipids/LFTs at next appointment.    Follow up in 1 month with an ECHO  CLEGG,AMY NP-C  02/21/2014  Patient seen and examined with Darrick Grinder, NP. We discussed all aspects of the encounter. I agree with the  assessment and plan as stated above.   Much improved with NSR. Suspect EF may be improving. Will increase spiro otherwise continue other HF regimen.. Repeat echo. Refer for rehab. She is maintaining NSR on amio. Will continue amio and Eliquis. followed by Dr. Rayann Heman. She denies snoring or any symptoms related to OSA but I still feel she is at high risk. Will place overnight oximeter.    Benay Spice 12:38 PM

## 2014-02-28 ENCOUNTER — Ambulatory Visit (HOSPITAL_COMMUNITY)
Admission: RE | Admit: 2014-02-28 | Discharge: 2014-02-28 | Disposition: A | Discharge status: Home / Self Care | Payer: Medicare Other | Source: Ambulatory Visit | Attending: Internal Medicine | Admitting: Internal Medicine

## 2014-02-28 DIAGNOSIS — I5022 Chronic systolic (congestive) heart failure: Secondary | ICD-10-CM | POA: Diagnosis not present

## 2014-02-28 DIAGNOSIS — I4891 Unspecified atrial fibrillation: Secondary | ICD-10-CM | POA: Diagnosis not present

## 2014-02-28 DIAGNOSIS — E119 Type 2 diabetes mellitus without complications: Secondary | ICD-10-CM | POA: Insufficient documentation

## 2014-02-28 DIAGNOSIS — E785 Hyperlipidemia, unspecified: Secondary | ICD-10-CM | POA: Insufficient documentation

## 2014-02-28 LAB — BASIC METABOLIC PANEL
Anion gap: 11 (ref 5–15)
BUN: 26 mg/dL — ABNORMAL HIGH (ref 6–23)
CO2: 28 mEq/L (ref 19–32)
Calcium: 9.6 mg/dL (ref 8.4–10.5)
Chloride: 104 mEq/L (ref 96–112)
Creatinine, Ser: 1.65 mg/dL — ABNORMAL HIGH (ref 0.50–1.10)
GFR calc Af Amer: 39 mL/min — ABNORMAL LOW (ref >90)
GFR calc non Af Amer: 33 mL/min — ABNORMAL LOW (ref >90)
Glucose, Bld: 112 mg/dL — ABNORMAL HIGH (ref 70–99)
Potassium: 4.5 mEq/L (ref 3.7–5.3)
Sodium: 143 mEq/L (ref 137–147)

## 2014-03-01 ENCOUNTER — Ambulatory Visit (HOSPITAL_COMMUNITY)
Admission: RE | Admit: 2014-03-01 | Discharge: 2014-03-01 | Disposition: A | Discharge status: Home / Self Care | Payer: Medicare Other | Source: Ambulatory Visit | Attending: Internal Medicine | Admitting: Internal Medicine

## 2014-03-01 DIAGNOSIS — I5022 Chronic systolic (congestive) heart failure: Secondary | ICD-10-CM

## 2014-03-01 DIAGNOSIS — I509 Heart failure, unspecified: Secondary | ICD-10-CM

## 2014-03-01 NOTE — Progress Notes (Signed)
Echo Lab  2D Echocardiogram completed.  [place], RDCS 03/01/2014 11:20 AM

## 2014-03-08 ENCOUNTER — Other Ambulatory Visit: Payer: Self-pay | Admitting: Cardiology

## 2014-03-13 ENCOUNTER — Other Ambulatory Visit: Payer: Self-pay | Admitting: Nurse Practitioner

## 2014-03-16 ENCOUNTER — Telehealth (HOSPITAL_COMMUNITY): Payer: Self-pay

## 2014-03-16 NOTE — Telephone Encounter (Signed)
Patient made aware of ECHO results.  Renee Pain

## 2014-03-24 ENCOUNTER — Encounter (HOSPITAL_COMMUNITY): Payer: Medicare Other

## 2014-03-24 ENCOUNTER — Ambulatory Visit (HOSPITAL_COMMUNITY): Payer: Medicare Other | Admitting: Nurse Practitioner

## 2014-03-28 ENCOUNTER — Ambulatory Visit (HOSPITAL_COMMUNITY)
Admission: RE | Admit: 2014-03-28 | Discharge: 2014-03-28 | Disposition: A | Discharge status: Home / Self Care | Payer: Medicare Other | Source: Ambulatory Visit | Attending: Nurse Practitioner | Admitting: Nurse Practitioner

## 2014-03-28 ENCOUNTER — Encounter (HOSPITAL_COMMUNITY): Payer: Self-pay | Admitting: Nurse Practitioner

## 2014-03-28 VITALS — BP 120/78 | HR 81 | Ht 66.0 in | Wt 351.0 lb

## 2014-03-28 DIAGNOSIS — I10 Essential (primary) hypertension: Secondary | ICD-10-CM | POA: Diagnosis not present

## 2014-03-28 DIAGNOSIS — I481 Persistent atrial fibrillation: Secondary | ICD-10-CM | POA: Insufficient documentation

## 2014-03-28 DIAGNOSIS — I428 Other cardiomyopathies: Secondary | ICD-10-CM | POA: Diagnosis not present

## 2014-03-28 DIAGNOSIS — I4891 Unspecified atrial fibrillation: Secondary | ICD-10-CM

## 2014-03-28 NOTE — Patient Instructions (Addendum)
Your physician recommends that you schedule a follow-up appointment in: 8 weeks with Roderic Palau, NP in afib clinic at the hospital  Your physician has recommended you make the following change in your medication:  1) Decrease Amiodarone to 200mg  daily

## 2014-03-28 NOTE — Progress Notes (Signed)
Primary Care Physician: Omer Jack, MD Referring Physician:  Dr Mila Homer is a 58 y.o. female with a h/o persistent atrial fibrillation, obesity, and nonischemic cardiomyopathy presents for electrophysiology evaluation .[name] Patient was admitted in 3/15 at [facility] with atrial fibrillation and CHF. TEE was done in preparation for DCCV, showing EF 25-30% but there was LA thrombus so DCCV was not done. Plan was for anticoagulation x 3 months followed by TEE-guided DCCV. LHC was also done, showing mild nonobstructive CAD. Patient was diuresed with IV Lasix in the hospital and started on apixaban. Chadsvasc score of at least 3.  Patient had TEE again in 6/15, showing EF 35-40% with diffuse hypokinesis and no LAA thrombus. She was cardioverted to NSR (with difficulty). She was started  on amiodarone, however she returned to atrial fibrillation. After she had been loaded on amiodarone, she was cardioverted  again in 7/15. She went back into NSR easily  but returned back to afib in a matter of days. She cannot tell when she went back into atrial fibrillation, she is asymptomatic in it.. Weight is down per patient about 30 pounds since March of this year. She is able to walk on flat ground without dyspnea.Denies smoking history, alcohol use and snoring.  Clinic visit today reveals pt in [place]. She is feeling will without any issues with irregular rhythm. Has noticed much less dyspnea. She continues on amiodarone 200 mg bid and had Amiodarone  level  TSH and liver lab work in July. She is unaware of any irregular heart beat [facility]  Today, she denies symptoms of palpitations, chest pain, shortness of breath, orthopnea, PND, lower extremity edema, dizziness, presyncope, syncope, or neurologic sequela. The patient is tolerating medications without difficulties and is otherwise without complaint today.  HV Past Medical History  Diagnosis Date  .  Aortic stenosis   . Complication of anesthesia   . PONV (postoperative nausea and vomiting)   . Atrial fibrillation   . NICM (nonischemic cardiomyopathy)     EF 30%  . Arthritis     KNEES  . Morbid obesity   . HLD (hyperlipidemia)   . Diabetes   . Left atrial thrombus    Past Surgical History  Procedure Laterality Date  . Joint replacement    . Tee without cardioversion N/A 07/29/2013    Procedure: TRANSESOPHAGEAL ECHOCARDIOGRAM (TEE);  Surgeon: [name], MD;  Location: [place];  Service: Cardiovascular;  Laterality: N/A;  . Cardioversion N/A 07/29/2013    Procedure: CARDIOVERSION;  Surgeon: [name], MD;  Location: [facility] ENDOSCOPY;  Service: Cardiovascular;  Laterality: N/A;  . Tee without cardioversion N/A 11/11/2013    Procedure: TRANSESOPHAGEAL ECHOCARDIOGRAM (TEE);  Surgeon: [name], MD;  Location: [place];  Service: Cardiovascular;  Laterality: N/A;  . Cardioversion N/A 11/11/2013    Procedure: CARDIOVERSION;  Surgeon: [name], MD;  Location: [place];  Service: Cardiovascular;  Laterality: N/A;  . Cardioversion N/A 12/08/2013    Procedure: CARDIOVERSION;  Surgeon: [name], MD;  Location: [facility] ENDOSCOPY;  Service: Cardiovascular;  Laterality: N/A;    Current Outpatient Prescriptions  Medication Sig Dispense Refill  . acetaminophen (TYLENOL) 325 MG tablet Take 325 mg by mouth every 6 (six) hours as needed for moderate pain.    [name] amiodarone (PACERONE) 200 MG tablet Take 1 tablet (200 mg total) by mouth 2 (two) times daily. 60 tablet 2  .  Cholecalciferol (VITAMIN D-3) 5000 UNITS TABS Take 5,000 Units by mouth daily.    [name] docusate sodium (COLACE) 100 MG capsule Take 100 mg by mouth at bedtime.     [name] ELIQUIS 5 MG TABS tablet TAKE 1 TABLET BY MOUTH TWICE A DAY 60 tablet 5  . Ferrous Sulfate (PX IRON) 27 MG TABS Take 1 tablet by mouth daily.    . furosemide (LASIX) 40 MG tablet TAKE 1 AND 1/2 TABLET BY MOUTH TWICE A DAY 90 tablet 3  .  lisinopril (PRINIVIL,ZESTRIL) 10 MG tablet Take 1 tablet (10 mg total) by mouth 2 (two) times daily. 60 tablet 6  . metoprolol succinate (TOPROL-XL) 50 MG 24 hr tablet TAKE 1 TABLET (50 MG TOTAL) BY MOUTH 2 (TWO) TIMES DAILY. TAKE WITH OR IMMEDIATELY FOLLOWING A MEAL. 60 tablet 6  . nitroGLYCERIN (NITROSTAT) 0.4 MG SL tablet Place 0.4 mg under the tongue every 5 (five) minutes as needed for chest pain.    . Omega-3 Fatty Acids (FISH OIL) 600 MG CAPS Take 600 Units by mouth 2 (two) times daily.    . pravastatin (PRAVACHOL) 80 MG tablet Take 1 tablet (80 mg total) by mouth daily. 30 tablet 6  . spironolactone (ALDACTONE) 25 MG tablet Take 1 tablet (25 mg total) by mouth daily. 30 tablet 6   No current facility-administered medications for this encounter.    No Known Allergies  History   Social History  . Marital Status: Single    Spouse Name: N/A    Number of Children: N/A  . Years of Education: N/A   Occupational History  . Not on file.   Social History Main Topics  . Smoking status: Never Smoker   . Smokeless tobacco: Never Used  . Alcohol Use: No  . Drug Use: No  . Sexual Activity: Not Currently   Other Topics Concern  . Not on file   Social History Narrative    Family History  Problem Relation Age of Onset  . CVA Mother     ROS- All systems are reviewed and negative except as per the HPI above  Physical Exam: BP with thigh cuff 120/78. Filed Vitals:   03/28/14 1601  Pulse: 81  Height: 5\' 6"  (1.676 m)  Weight: 351 lb (159.213 kg)  SpO2: 96%    GEN- The patient is well appearing, alert and oriented x 3 today.   Head- normocephalic, atraumatic Eyes-  Sclera clear, conjunctiva pink Ears- hearing intact Oropharynx- clear Neck- supple, no JVP Lymph- no cervical lymphadenopathy Lungs- Clear to ausculation bilaterally, normal work of breathing Heart- Regular rate and rhythm, no murmurs, rubs or gallops, PMI not laterally displaced GI- soft, NT, ND, +  BS Extremities- no clubbing, cyanosis, or edema MS- no significant deformity or atrophy Skin- no rash or lesion Psych- euthymic mood, full affect Neuro- strength and sensation are intact  EKG- Sinus rhythm at 66 bpm with PAC's. PR 126ms, QRS, 90 ms, QTc 566ms.  [place] (3/15) with EF 25-30%, moderate MR, PA systolic pressure 51 mmHg, moderate to severe TR, LA thrombus was noted.  [place] (6/15) with EF 35-40%, diffuse hypokinesis, mild MR, moderate TR, +PFO, no LA appendage thrombus.    LHC- (3/15) with 50% stenosis small PLV.    Assessment and Plan: 1. Persistent Afib- now in SR, on Amiodarone 400 mg a day. Will decrease to 200 mg daily. . Patient would not currently be a good candidate for afib ablation due to morbid obesity. Have encouraged pt to lose  weight and if can get below 300 lbs, procedure likely to have higher success rate.    2.NICM Stable Continue ace, bb, diuretic,spironolactone .  3. HTN Well managed today. No changes  4. Morbid obesity Encouraged continued weight lose and regular exercise. Body mass index is 56.68 kg/(m^2).  Referred to free dietary class at [facility]. [facility]. Overnight oximetry as ordered thru HF clinic.  5.CHADS2VA2Sc  Continue to reduce stroke risk No current issues with bleeding.  F/u in one 6-8 weeks in AFib clinic.

## 2014-04-17 ENCOUNTER — Other Ambulatory Visit (HOSPITAL_COMMUNITY): Payer: Self-pay | Admitting: Cardiology

## 2014-04-17 DIAGNOSIS — I4891 Unspecified atrial fibrillation: Secondary | ICD-10-CM

## 2014-04-17 MED ORDER — METOPROLOL SUCCINATE ER 50 MG PO TB24: ORAL_TABLET | ORAL | Status: DC

## 2014-04-17 MED ORDER — SPIRONOLACTONE 25 MG PO TABS: 25.0000 mg | ORAL_TABLET | Freq: Every day | ORAL | Status: DC

## 2014-04-17 MED ORDER — AMIODARONE HCL 200 MG PO TABS: 200.0000 mg | ORAL_TABLET | Freq: Two times a day (BID) | ORAL | Status: DC

## 2014-04-17 MED ORDER — PRAVASTATIN SODIUM 80 MG PO TABS: 80.0000 mg | ORAL_TABLET | Freq: Every day | ORAL | Status: DC

## 2014-04-17 MED ORDER — FUROSEMIDE 40 MG PO TABS: ORAL_TABLET | ORAL | Status: DC

## 2014-04-27 ENCOUNTER — Encounter (HOSPITAL_COMMUNITY): Payer: Self-pay | Admitting: Cardiovascular Disease

## 2014-05-23 ENCOUNTER — Encounter (HOSPITAL_COMMUNITY): Payer: Self-pay | Admitting: Nurse Practitioner

## 2014-05-23 ENCOUNTER — Ambulatory Visit (HOSPITAL_COMMUNITY)
Admission: RE | Admit: 2014-05-23 | Discharge: 2014-05-23 | Disposition: A | Discharge status: Home / Self Care | Payer: Medicare Other | Source: Ambulatory Visit | Attending: Nurse Practitioner | Admitting: Nurse Practitioner

## 2014-05-23 VITALS — BP 106/58 | HR 68 | Resp 14 | Ht 66.0 in | Wt 348.6 lb

## 2014-05-23 DIAGNOSIS — I350 Nonrheumatic aortic (valve) stenosis: Secondary | ICD-10-CM | POA: Insufficient documentation

## 2014-05-23 DIAGNOSIS — I10 Essential (primary) hypertension: Secondary | ICD-10-CM | POA: Insufficient documentation

## 2014-05-23 DIAGNOSIS — E785 Hyperlipidemia, unspecified: Secondary | ICD-10-CM | POA: Insufficient documentation

## 2014-05-23 DIAGNOSIS — I5021 Acute systolic (congestive) heart failure: Secondary | ICD-10-CM | POA: Diagnosis not present

## 2014-05-23 DIAGNOSIS — I429 Cardiomyopathy, unspecified: Secondary | ICD-10-CM | POA: Diagnosis not present

## 2014-05-23 DIAGNOSIS — Z7901 Long term (current) use of anticoagulants: Secondary | ICD-10-CM | POA: Insufficient documentation

## 2014-05-23 DIAGNOSIS — N289 Disorder of kidney and ureter, unspecified: Secondary | ICD-10-CM | POA: Diagnosis not present

## 2014-05-23 DIAGNOSIS — I481 Persistent atrial fibrillation: Secondary | ICD-10-CM | POA: Diagnosis present

## 2014-05-23 DIAGNOSIS — Z79899 Other long term (current) drug therapy: Secondary | ICD-10-CM | POA: Insufficient documentation

## 2014-05-23 DIAGNOSIS — E119 Type 2 diabetes mellitus without complications: Secondary | ICD-10-CM | POA: Diagnosis not present

## 2014-05-23 DIAGNOSIS — I4819 Other persistent atrial fibrillation: Secondary | ICD-10-CM

## 2014-05-23 DIAGNOSIS — I4891 Unspecified atrial fibrillation: Secondary | ICD-10-CM

## 2014-05-23 LAB — T3, FREE: T3, Free: 2.5 pg/mL (ref 2.3–4.2)

## 2014-05-23 LAB — COMPREHENSIVE METABOLIC PANEL
ALT: 16 U/L (ref 0–35)
AST: 21 U/L (ref 0–37)
Albumin: 3.9 g/dL (ref 3.5–5.2)
Alkaline Phosphatase: 74 U/L (ref 39–117)
Anion gap: 10 (ref 5–15)
BUN: 26 mg/dL — ABNORMAL HIGH (ref 6–23)
CO2: 26 mmol/L (ref 19–32)
Calcium: 9.3 mg/dL (ref 8.4–10.5)
Chloride: 102 mEq/L (ref 96–112)
Creatinine, Ser: 2.08 mg/dL — ABNORMAL HIGH (ref 0.50–1.10)
GFR calc Af Amer: 29 mL/min — ABNORMAL LOW (ref >90)
GFR calc non Af Amer: 25 mL/min — ABNORMAL LOW (ref >90)
Glucose, Bld: 100 mg/dL — ABNORMAL HIGH (ref 70–99)
Potassium: 4.1 mmol/L (ref 3.5–5.1)
Sodium: 138 mmol/L (ref 135–145)
Total Bilirubin: 0.4 mg/dL (ref 0.3–1.2)
Total Protein: 7.4 g/dL (ref 6.0–8.3)

## 2014-05-23 LAB — T4, FREE: Free T4: 1.36 ng/dL (ref 0.80–1.80)

## 2014-05-23 LAB — TSH: TSH: 1.5 u[IU]/mL (ref 0.350–4.500)

## 2014-05-23 NOTE — Patient Instructions (Signed)
Your physician recommends that you return for lab work in: today   Your physician wants you to follow-up in: 4 months// You will receive a reminder letter in the mail two months in advance. CALL AS SOON AS YOU GET THE LETTER, due in June.  If you don't receive a letter, please call our office to schedule the follow-up appointment.

## 2014-05-23 NOTE — Progress Notes (Signed)
Primary Care Physician: Omer Jack, MD Referring Physician:  Dr Mila Homer is a 59 y.o. female with a h/o persistent atrial fibrillation, obesity, and nonischemic cardiomyopathy presents for electrophysiology evaluation .[name] Patient was admitted in 3/15 at [facility] with atrial fibrillation and CHF. TEE was done in preparation for DCCV, showing EF 25-30% but there was LA thrombus so DCCV was not done. Plan was for anticoagulation x 3 months followed by TEE-guided DCCV. LHC was also done, showing mild nonobstructive CAD. Patient was diuresed with IV Lasix in the hospital and started on apixaban. Chadsvasc score of at least 3.  Patient had TEE again in 6/15, showing EF 35-40% with diffuse hypokinesis and no LAA thrombus. She was cardioverted to NSR (with difficulty). She was started  on amiodarone, however she returned to atrial fibrillation. After she had been loaded on amiodarone, she was cardioverted  again in 7/15. She went back into NSR easily  but returned back to afib in a matter of days. She cannot tell when she went back into atrial fibrillation, she is asymptomatic in it.. Weight is down per patient about 30 pounds since March of this year. She is able to walk on flat ground without dyspnea.Denies smoking history, alcohol use and snoring.  Clinic visit today reveals pt in [place]. She is feeling will without any issues with irregular rhythm. Has noticed much less dyspnea. She continues on amiodarone 200 mg qd.   She [facility] is suppose to finish the 31st. She will talk to the sponsoring person who set up the class to see if it can be continued. Her weight id down 3 lbs.  Today, she denies symptoms of palpitations, chest pain, shortness of breath, orthopnea, PND, lower extremity edema, dizziness, presyncope, syncope, or neurologic sequela. The patient is tolerating medications without difficulties and  is otherwise without complaint today.    Past Medical History  Diagnosis Date  . Aortic stenosis   . Complication of anesthesia   . PONV (postoperative nausea and vomiting)   . Atrial fibrillation   . NICM (nonischemic cardiomyopathy)     EF 30%  . Arthritis     KNEES  . Morbid obesity   . HLD (hyperlipidemia)   . Diabetes   . Left atrial thrombus    Past Surgical History  Procedure Laterality Date  . Joint replacement    . Tee without cardioversion N/A 07/29/2013    Procedure: TRANSESOPHAGEAL ECHOCARDIOGRAM (TEE);  Surgeon: [name], MD;  Location: [place];  Service: Cardiovascular;  Laterality: N/A;  . Cardioversion N/A 07/29/2013    Procedure: CARDIOVERSION;  Surgeon: [name], MD;  Location: [facility] ENDOSCOPY;  Service: Cardiovascular;  Laterality: N/A;  . Tee without cardioversion N/A 11/11/2013    Procedure: TRANSESOPHAGEAL ECHOCARDIOGRAM (TEE);  Surgeon: [name], MD;  Location: [place];  Service: Cardiovascular;  Laterality: N/A;  . Cardioversion N/A 11/11/2013    Procedure: CARDIOVERSION;  Surgeon: [name], MD;  Location: [place];  Service: Cardiovascular;  Laterality: N/A;  . Cardioversion N/A 12/08/2013    Procedure: CARDIOVERSION;  Surgeon: [name], MD;  Location: [place];  Service: Cardiovascular;  Laterality: N/A;  . Left heart catheterization with coronary angiogram N/A 08/01/2013    Procedure: LEFT HEART CATHETERIZATION WITH CORONARY ANGIOGRAM;  Surgeon: Blane Ohara, MD;  Location: [facility] CATH LAB;  Service: Cardiovascular;  Laterality:  N/A;    Current Outpatient Prescriptions  Medication Sig Dispense Refill  . amiodarone (PACERONE) 200 MG tablet Take 1 tablet (200 mg total) by mouth 2 (two) times daily. 180 tablet 3  . Cholecalciferol (VITAMIN D-3) 5000 UNITS TABS Take 5,000 Units by mouth daily.    [name] docusate sodium (COLACE) 100 MG capsule Take 100 mg by mouth at bedtime.     [name] ELIQUIS 5 MG TABS tablet TAKE 1  TABLET BY MOUTH TWICE A DAY 60 tablet 5  . Ferrous Sulfate (PX IRON) 27 MG TABS Take 1 tablet by mouth daily.    . furosemide (LASIX) 40 MG tablet TAKE 1 AND 1/2 TABLET BY MOUTH TWICE A DAY 270 tablet 3  . lisinopril (PRINIVIL,ZESTRIL) 10 MG tablet Take 1 tablet (10 mg total) by mouth 2 (two) times daily. 60 tablet 6  . metoprolol succinate (TOPROL-XL) 50 MG 24 hr tablet TAKE 1 TABLET (50 MG TOTAL) BY MOUTH 2 (TWO) TIMES DAILY. TAKE WITH OR IMMEDIATELY FOLLOWING A MEAL. 180 tablet 3  . nitroGLYCERIN (NITROSTAT) 0.4 MG SL tablet Place 0.4 mg under the tongue every 5 (five) minutes as needed for chest pain.    . Omega-3 Fatty Acids (FISH OIL) 600 MG CAPS Take 600 Units by mouth 2 (two) times daily.    . pravastatin (PRAVACHOL) 80 MG tablet Take 1 tablet (80 mg total) by mouth daily. 90 tablet 3  . spironolactone (ALDACTONE) 25 MG tablet Take 1 tablet (25 mg total) by mouth daily. 90 tablet 3  . acetaminophen (TYLENOL) 325 MG tablet Take 325 mg by mouth every 6 (six) hours as needed for moderate pain.     No current facility-administered medications for this encounter.    No Known Allergies  History   Social History  . Marital Status: Single    Spouse Name: N/A    Number of Children: N/A  . Years of Education: N/A   Occupational History  . Not on file.   Social History Main Topics  . Smoking status: Never Smoker   . Smokeless tobacco: Never Used  . Alcohol Use: No  . Drug Use: No  . Sexual Activity: Not Currently   Other Topics Concern  . Not on file   Social History Narrative    Family History  Problem Relation Age of Onset  . CVA Mother     ROS- Systems negative except as per the HPI above  Physical Exam: BP with thigh cuff 120/78. Filed Vitals:   05/23/14 1405  BP: 106/58  Pulse: 68  Resp: 14  Height: 5\' 6"  (1.676 m)  Weight: 348 lb 9.6 oz (158.124 kg)  SpO2: 100%    GEN- The patient is well appearing, alert and oriented x 3 today.   Head- normocephalic,  atraumatic Eyes-  Sclera clear, conjunctiva pink Ears- hearing intact Oropharynx- clear Neck- supple, no JVP Lymph- no cervical lymphadenopathy Lungs- Clear to ausculation bilaterally, normal work of breathing Heart- Regular rate and rhythm, no murmurs, rubs or gallops, PMI not laterally displaced GI- soft, NT, ND, + BS Extremities- no clubbing, cyanosis, or edema MS- no significant deformity or atrophy Skin- no rash or lesion Psych- euthymic mood, full affect Neuro- strength and sensation are intact  EKG- Sinus rhythm at 60 bpm with rare PAC, PR 164 ms, QRS, 92 ms, QTc 404 ms.  [place] (3/15) with EF 25-30%, moderate MR, PA systolic pressure 51 mmHg, moderate to severe TR, LA thrombus was noted.  TE[place] (6/15) with EF 35-40%,  diffuse hypokinesis, mild MR, moderate TR, +PFO, no LA appendage thrombus.    LHC- (3/15) with 50% stenosis small PLV.    Assessment and Plan: 1. Persistent Afib-  Maintaining SR on amiodarone 200 mg a day. Liver, Thyroid panel today.  2.NICM Stable Continue ace, bb, diuretic,spironolactone. Consideration for repeating echo on subsequent visits.  3. HTN Well managed today. No changes  4. Morbid obesity Encouraged continued weight lose and regular exercise.  5.CHADS2VA2Sc  Continue xarelto to reduce stroke risk No current issues with bleeding.  6. Renal insufficiency Check kidney function today.  F/u Dr. Rayann Heman in 4 months.

## 2014-05-24 NOTE — Addendum Note (Signed)
Encounter addended by: Asencion Gowda, CCT on: 05/24/2014 11:24 AM<BR>     Documentation filed: Charges VN

## 2014-05-31 ENCOUNTER — Ambulatory Visit (HOSPITAL_COMMUNITY)
Admission: RE | Admit: 2014-05-31 | Discharge: 2014-05-31 | Disposition: A | Discharge status: Home / Self Care | Payer: Medicare Other | Source: Ambulatory Visit | Attending: Internal Medicine | Admitting: Internal Medicine

## 2014-05-31 DIAGNOSIS — I4891 Unspecified atrial fibrillation: Secondary | ICD-10-CM | POA: Insufficient documentation

## 2014-05-31 LAB — BASIC METABOLIC PANEL
Anion gap: 12 (ref 5–15)
BUN: 20 mg/dL (ref 6–23)
CO2: 24 mmol/L (ref 19–32)
Calcium: 9.6 mg/dL (ref 8.4–10.5)
Chloride: 105 mEq/L (ref 96–112)
Creatinine, Ser: 1.56 mg/dL — ABNORMAL HIGH (ref 0.50–1.10)
GFR calc Af Amer: 42 mL/min — ABNORMAL LOW (ref >90)
GFR calc non Af Amer: 36 mL/min — ABNORMAL LOW (ref >90)
Glucose, Bld: 94 mg/dL (ref 70–99)
Potassium: 4.5 mmol/L (ref 3.5–5.1)
Sodium: 141 mmol/L (ref 135–145)

## 2014-07-05 ENCOUNTER — Encounter (HOSPITAL_COMMUNITY): Payer: Self-pay

## 2014-07-05 ENCOUNTER — Ambulatory Visit (HOSPITAL_COMMUNITY)
Admission: RE | Admit: 2014-07-05 | Discharge: 2014-07-05 | Disposition: A | Discharge status: Home / Self Care | Payer: Medicare Other | Source: Ambulatory Visit | Attending: Adult Health | Admitting: Adult Health

## 2014-07-05 VITALS — BP 112/57 | HR 62 | Resp 20 | Wt 349.5 lb

## 2014-07-05 DIAGNOSIS — I481 Persistent atrial fibrillation: Secondary | ICD-10-CM | POA: Diagnosis not present

## 2014-07-05 DIAGNOSIS — I4891 Unspecified atrial fibrillation: Secondary | ICD-10-CM | POA: Insufficient documentation

## 2014-07-05 DIAGNOSIS — I2510 Atherosclerotic heart disease of native coronary artery without angina pectoris: Secondary | ICD-10-CM | POA: Diagnosis not present

## 2014-07-05 DIAGNOSIS — E785 Hyperlipidemia, unspecified: Secondary | ICD-10-CM | POA: Insufficient documentation

## 2014-07-05 DIAGNOSIS — I429 Cardiomyopathy, unspecified: Secondary | ICD-10-CM | POA: Diagnosis not present

## 2014-07-05 DIAGNOSIS — I480 Paroxysmal atrial fibrillation: Secondary | ICD-10-CM

## 2014-07-05 DIAGNOSIS — I5022 Chronic systolic (congestive) heart failure: Secondary | ICD-10-CM | POA: Insufficient documentation

## 2014-07-05 LAB — BASIC METABOLIC PANEL
Anion gap: 5 (ref 5–15)
BUN: 23 mg/dL (ref 6–23)
CO2: 27 mmol/L (ref 19–32)
Calcium: 9.3 mg/dL (ref 8.4–10.5)
Chloride: 109 mmol/L (ref 96–112)
Creatinine, Ser: 1.79 mg/dL — ABNORMAL HIGH (ref 0.50–1.10)
GFR calc Af Amer: 35 mL/min — ABNORMAL LOW (ref >90)
GFR calc non Af Amer: 30 mL/min — ABNORMAL LOW (ref >90)
Glucose, Bld: 94 mg/dL (ref 70–99)
Potassium: 4.6 mmol/L (ref 3.5–5.1)
Sodium: 141 mmol/L (ref 135–145)

## 2014-07-05 MED ORDER — FUROSEMIDE 40 MG PO TABS: 40.0000 mg | ORAL_TABLET | ORAL | Status: DC

## 2014-07-05 NOTE — Progress Notes (Signed)
Patient ID: Kimberly [name], female   DOB: 1955/10/05, 59 y.o.   MRN: [id number] PCP: Dr. Gery Pray [facility])  59 yo with history of persistent atrial fibrillation, obesity, and nonischemic cardiomyopathy presents for cardiology followup.  Patient was admitted in 58/15 at [facility] with atrial fibrillation and CHF.  TEE was done in preparation for DCCV, showing EF 25-30% but there was LA thrombus so DCCV was not done.  Plan was for anticoagulation x 3 months followed by TEE-guided DCCV in the OR (due to patient's body habitus).  LHC was also done, showing mild nonobstructive CAD.  Patient was diuresed with IV Lasix in the hospital and started on apixaban.   Patient had TEE again in 6/15, showing EF 35-40% with diffuse hypokinesis and no LAA thrombus.  She was cardioverted to NSR (with difficulty) and she was started on amiodarone.  Unfortunately she went back into A fib and she was again loaded on  Amiodarone and  cardioverted on  7/15.    She returns for follow up. PCP stopped lasix last Thursday due to elevated renal function.  Denies SOB/PND/Orthopnea. Weight at home 346-347 pounds.  Not able to exercise much due joint pain. Increased leg edema. Having difficulty paying for medications.  Was going to [facility] and attends water aerobics.    Labs (4/15): HCT 35.3 Labs (5/15): K 3.9, creatinine 1.0 Labs (6/15): K 4.3, creatinine 1.0, BNP 217, LDL 106 Labs (7/15): K 4.5, creatinine 1.24, LFTs normal, TSH normal   PMH: 1. Obese 2. Atrial fibrillation: Persistent.  Unable to cardiovert in 3/15 due to LAA thrombus noted on TEE. TEE in 6/15 without LAA thrombus.  Patient had cardioversion to NSR with difficulty but back in atrial fibrillation by 58/15 appt.  She was started on amiodarone and cardioverted in 7/15, but was back in atrial fibrillation by 58/15 despite amiodarone use.  3. H/o TKR 4. Nonischemic cardiomyopathy: TEE (3/15) with EF 25-30%, moderate MR, PA systolic pressure 51 mmHg, moderate to severe  TR, LA thrombus was noted.   LHC (3/15) with 50% stenosis small PLV.  TEE (6/15) with EF 35-40%, diffuse hypokinesis, mild MR, moderate TR, +PFO, no LA appendage thrombus.  5. CAD: Nonobstructive.  LHC (3/15) with 50% stenosis small PLV. 6. PFO  SH: Single, lives in [place], nonsmoker has 1 daughter   FH: No premature CAD or cardiomyopathy.  ROS: All systems reviewed and negative except as per HPI.   Current Outpatient Prescriptions  Medication Sig Dispense Refill  . acetaminophen (TYLENOL) 325 MG tablet Take 325 mg by mouth every 6 (six) hours as needed for moderate pain.    [name] amiodarone (PACERONE) 200 MG tablet Take 1 tablet (200 mg total) by mouth 2 (two) times daily. 180 tablet 3  . Cholecalciferol (VITAMIN D-3) 5000 UNITS TABS Take 5,000 Units by mouth daily.    [name] docusate sodium (COLACE) 100 MG capsule Take 100 mg by mouth at bedtime.     [name] ELIQUIS 5 MG TABS tablet TAKE 1 TABLET BY MOUTH TWICE A DAY 60 tablet 5  . Ferrous Sulfate (PX IRON) 27 MG TABS Take 1 tablet by mouth daily.    [name] lisinopril (PRINIVIL,ZESTRIL) 10 MG tablet Take 1 tablet (10 mg total) by mouth 2 (two) times daily. 60 tablet 6  . metoprolol succinate (TOPROL-XL) 50 MG 24 hr tablet TAKE 1 TABLET (50 MG TOTAL) BY MOUTH 2 (TWO) TIMES DAILY. TAKE WITH OR IMMEDIATELY FOLLOWING A MEAL. 180 tablet 3  . nitroGLYCERIN (NITROSTAT) 0.4 MG  SL tablet Place 0.4 mg under the tongue every 5 (five) minutes as needed for chest pain.    . Omega-3 Fatty Acids (FISH OIL) 600 MG CAPS Take 600 Units by mouth 2 (two) times daily.    . pravastatin (PRAVACHOL) 80 MG tablet Take 1 tablet (80 mg total) by mouth daily. 90 tablet 3  . spironolactone (ALDACTONE) 25 MG tablet Take 1 tablet (25 mg total) by mouth daily. 90 tablet 3  . furosemide (LASIX) 40 MG tablet TAKE 1 AND 1/2 TABLET BY MOUTH TWICE A DAY (Patient not taking: Reported on 07/05/2014) 270 tablet 3   No current facility-administered medications for this encounter.    BP 112/57  mmHg  Pulse 62  Resp 20  Wt 349 lb 8 oz (158.532 kg)  SpO2 96% General: NAD, obese Neck: JVP 9-10 cm, no thyromegaly or thyroid nodule.  Lungs: Clear to auscultation bilaterally with normal respiratory effort. CV: Nondisplaced PMI.  Heart irregular S1/S2, no S3/S4, 1/6 SEM RUSB.  No edema.  No carotid bruit.  Normal pedal pulses.  Abdomen: Soft, nontender, no hepatosplenomegaly, no distention.  Skin: Intact without lesions or rashes.  Neurologic: Alert and oriented x 3.  Psych: Normal affect. Extremities: No clubbing or cyanosis. R and LLE 1-2+ edema.   Assessment/Plan: 1. Chronic systolic CHF: Nonischemic cardiomyopathy.  Recovered EF 02/2014 55-60%. - Off lasix per PCP due to elevated renal function.  Today she has mild volume overload. Add back 40 mg lasix every other day.  - Continue lisinopril and Toprol XL.  - Continue spironolactone to 25 mg daily.  Check BMET today.   Reinforced daily weights, low salt diet, and limiting fluids to < 2 liters per day.   2. Atrial fibrillation:  This has recurred twice now after cardioversion, the second time while on amiodarone. No maintainingg NSR. Continue amiodarone 200 mg daily.  She had an LAA thrombus on TEE in 3/15 but not in 6/15.   - Followed by Dr Rayann Heman in A fib clinic. Not a candidate for A fib ablation due to morbid obesity.  LFTs TSH followed A fib clinic . Has follow up in 4 months with Dr Rayann Heman.   3. CAD: Mild, nonobstructive by cath.  No ASA given Eliquis use.   4. Hyperlipidemia: Continue Pravastatin daily.     Follow up in 3 months  Trudie Cervantes NP-C  07/05/2014

## 2014-07-05 NOTE — Patient Instructions (Signed)
DECREASE Lasix to 40mg  every other day.  Follow up 3 months.  Do the following things EVERYDAY: 1) Weigh yourself in the morning before breakfast. Write it down and keep it in a log. 2) Take your medicines as prescribed 3) Eat low salt foods-Limit salt (sodium) to 2000 mg per day.  4) Stay as active as you can everyday 5) Limit all fluids for the day to less than 2 liters

## 2014-08-31 ENCOUNTER — Other Ambulatory Visit: Payer: Self-pay | Admitting: Cardiology

## 2014-09-03 ENCOUNTER — Other Ambulatory Visit: Payer: Self-pay | Admitting: Cardiology

## 2014-09-25 ENCOUNTER — Other Ambulatory Visit (HOSPITAL_COMMUNITY): Payer: Self-pay | Admitting: Adult Health

## 2014-10-31 ENCOUNTER — Other Ambulatory Visit (HOSPITAL_COMMUNITY): Payer: Self-pay | Admitting: Adult Health

## 2014-12-02 ENCOUNTER — Other Ambulatory Visit (HOSPITAL_COMMUNITY): Payer: Self-pay | Admitting: Adult Health

## 2014-12-04 ENCOUNTER — Telehealth (HOSPITAL_COMMUNITY): Payer: Self-pay | Admitting: Vascular Surgery

## 2014-12-07 NOTE — Telephone Encounter (Signed)
Pt called back got appt scheduled

## 2014-12-19 ENCOUNTER — Ambulatory Visit (HOSPITAL_COMMUNITY)
Admission: RE | Admit: 2014-12-19 | Discharge: 2014-12-19 | Disposition: A | Discharge status: Home / Self Care | Payer: Medicare Other | Source: Ambulatory Visit | Attending: Cardiology | Admitting: Cardiology

## 2014-12-19 ENCOUNTER — Encounter (HOSPITAL_COMMUNITY): Payer: Self-pay

## 2014-12-19 VITALS — BP 118/78 | HR 60 | Wt 345.5 lb

## 2014-12-19 DIAGNOSIS — Z7982 Long term (current) use of aspirin: Secondary | ICD-10-CM | POA: Diagnosis not present

## 2014-12-19 DIAGNOSIS — E785 Hyperlipidemia, unspecified: Secondary | ICD-10-CM | POA: Diagnosis not present

## 2014-12-19 DIAGNOSIS — I429 Cardiomyopathy, unspecified: Secondary | ICD-10-CM | POA: Diagnosis not present

## 2014-12-19 DIAGNOSIS — E669 Obesity, unspecified: Secondary | ICD-10-CM | POA: Diagnosis not present

## 2014-12-19 DIAGNOSIS — Z79899 Other long term (current) drug therapy: Secondary | ICD-10-CM | POA: Insufficient documentation

## 2014-12-19 DIAGNOSIS — I5022 Chronic systolic (congestive) heart failure: Secondary | ICD-10-CM | POA: Insufficient documentation

## 2014-12-19 DIAGNOSIS — I4891 Unspecified atrial fibrillation: Secondary | ICD-10-CM | POA: Insufficient documentation

## 2014-12-19 DIAGNOSIS — I2510 Atherosclerotic heart disease of native coronary artery without angina pectoris: Secondary | ICD-10-CM | POA: Diagnosis not present

## 2014-12-19 LAB — CBC
HCT: 31.3 % — ABNORMAL LOW (ref 36.0–46.0)
Hemoglobin: 10 g/dL — ABNORMAL LOW (ref 12.0–15.0)
MCH: 29.1 pg (ref 26.0–34.0)
MCHC: 31.9 g/dL (ref 30.0–36.0)
MCV: 91 fL (ref 78.0–100.0)
Platelets: 235 10*3/uL (ref 150–400)
RBC: 3.44 MIL/uL — ABNORMAL LOW (ref 3.87–5.11)
RDW: 14.3 % (ref 11.5–15.5)
WBC: 4.6 10*3/uL (ref 4.0–10.5)

## 2014-12-19 LAB — BASIC METABOLIC PANEL
Anion gap: 8 (ref 5–15)
BUN: 16 mg/dL (ref 6–20)
CO2: 25 mmol/L (ref 22–32)
Calcium: 9.2 mg/dL (ref 8.9–10.3)
Chloride: 105 mmol/L (ref 101–111)
Creatinine, Ser: 1.2 mg/dL — ABNORMAL HIGH (ref 0.44–1.00)
GFR calc Af Amer: 57 mL/min — ABNORMAL LOW (ref >60)
GFR calc non Af Amer: 49 mL/min — ABNORMAL LOW (ref >60)
Glucose, Bld: 89 mg/dL (ref 65–99)
Potassium: 4.2 mmol/L (ref 3.5–5.1)
Sodium: 138 mmol/L (ref 135–145)

## 2014-12-19 LAB — TSH: TSH: 1.031 u[IU]/mL (ref 0.350–4.500)

## 2014-12-19 MED ORDER — AMIODARONE HCL 200 MG PO TABS: 200.0000 mg | ORAL_TABLET | Freq: Every day | ORAL | Status: DC

## 2014-12-19 MED ORDER — LISINOPRIL 10 MG PO TABS: 10.0000 mg | ORAL_TABLET | Freq: Two times a day (BID) | ORAL | Status: DC

## 2014-12-19 MED ORDER — SPIRONOLACTONE 25 MG PO TABS: 25.0000 mg | ORAL_TABLET | Freq: Every day | ORAL | Status: DC

## 2014-12-19 NOTE — Progress Notes (Signed)
Patient ID: Kimberly [name], female   DOB: 08/05/1955, 58 y.o.   MRN: [id number] PCP: Windell Hummingbird  [facility]) Primary HF Cardiology: Dr Aundra Dubin  59 yo with history of persistent atrial fibrillation, obesity, and nonischemic cardiomyopathy presents for cardiology followup.  Patient was admitted in 3/15 at [facility] with atrial fibrillation and CHF.  TEE was done in preparation for DCCV, showing EF 25-30% but there was LA thrombus so DCCV was not done.  Plan was for anticoagulation x 3 months followed by TEE-guided DCCV in the OR (due to patient's body habitus).  LHC was also done, showing mild nonobstructive CAD.  Patient was diuresed with IV Lasix in the hospital and started on apixaban.   Patient had TEE again in 6/15, showing EF 35-40% with diffuse hypokinesis and no LAA thrombus.  She was cardioverted to NSR (with difficulty) and she was started on amiodarone.  Unfortunately she went back into A fib and she was again loaded on  Amiodarone and  cardioverted on  7/15.    She returns for follow up. Overall feeling good. Denies SOB/PND/Orthopnea. Weight at home 345 pounds. Has been out of spiro for the last 4 days. Not exercising. Taking all medications.   No BRBPR.   Labs (4/15): HCT 35.3 Labs (5/15): K 3.9, creatinine 1.0 Labs (6/15): K 4.3, creatinine 1.0, BNP 217, LDL 106 Labs (7/15): K 4.5, creatinine 1.24, LFTs normal, TSH normal Labs 2/15: K 4.6 Creatinine 1.79    PMH: 1. Obese 2. Atrial fibrillation: Persistent.  Unable to cardiovert in 3/15 due to LAA thrombus noted on TEE. TEE in 6/15 without LAA thrombus.  Patient had cardioversion to NSR with difficulty but back in atrial fibrillation by 7/15 appt.  She was started on amiodarone and cardioverted in 7/15, but was back in atrial fibrillation by 8/15 despite amiodarone use.  3. H/o TKR 4. Nonischemic cardiomyopathy: TEE (3/15) with EF 25-30%, moderate MR, PA systolic pressure 51 mmHg, moderate to severe TR, LA thrombus was noted.    LHC (3/15) with 50% stenosis small PLV.  TEE (6/15) with EF 35-40%, diffuse hypokinesis, mild MR, moderate TR, +PFO, no LA appendage thrombus.  5. CAD: Nonobstructive.  LHC (3/15) with 50% stenosis small PLV. 6. PFO  SH: Single, lives in [place], nonsmoker has 1 daughter   FH: No premature CAD or cardiomyopathy.  ROS: All systems reviewed and negative except as per HPI.   Current Outpatient Prescriptions  Medication Sig Dispense Refill  . amiodarone (PACERONE) 200 MG tablet Take 1 tablet (200 mg total) by mouth 2 (two) times daily. (Patient taking differently: Take 200 mg by mouth daily. ) 180 tablet 3  . Cholecalciferol (VITAMIN D-3) 5000 UNITS TABS Take 5,000 Units by mouth daily.    [name] docusate sodium (COLACE) 100 MG capsule Take 100 mg by mouth at bedtime.     [name] ELIQUIS 5 MG TABS tablet TAKE 1 TABLET BY MOUTH TWICE A DAY 60 tablet 5  . Ferrous Sulfate (PX IRON) 27 MG TABS Take 1 tablet by mouth daily.    . furosemide (LASIX) 40 MG tablet Take 1 tablet (40 mg total) by mouth every other day. 15 tablet 6  . lisinopril (PRINIVIL,ZESTRIL) 10 MG tablet Take 1 tablet (10 mg total) by mouth 2 (two) times daily. 60 tablet 6  . metoprolol succinate (TOPROL-XL) 50 MG 24 hr tablet TAKE 1 TABLET (50 MG TOTAL) BY MOUTH 2 (TWO) TIMES DAILY. TAKE WITH OR IMMEDIATELY FOLLOWING A MEAL. 180 tablet 3  .  Omega-3 Fatty Acids (FISH OIL) 600 MG CAPS Take 600 Units by mouth 2 (two) times daily.    . nitroGLYCERIN (NITROSTAT) 0.4 MG SL tablet Place 0.4 mg under the tongue every 5 (five) minutes as needed for chest pain.    [name] spironolactone (ALDACTONE) 25 MG tablet Take 1 tablet (25 mg total) by mouth daily. (Patient not taking: Reported on 12/19/2014) 90 tablet 3   No current facility-administered medications for this encounter.    BP 118/78 mmHg  Pulse 60  Wt 345 lb 8 oz (156.718 kg)  SpO2 99% General: NAD, obese Neck: JVP flat,  no thyromegaly or thyroid nodule.  Lungs: Clear to auscultation  bilaterally with normal respiratory effort. CV: Nondisplaced PMI.  Heart irregular S1/S2, no S3/S4, 1/6 SEM RUSB.  No edema.  No carotid bruit.  Normal pedal pulses.  Abdomen: Soft, nontender, no hepatosplenomegaly, no distention.  Skin: Intact without lesions or rashes.  Neurologic: Alert and oriented x 3.  Psych: Normal affect. Extremities: No clubbing or cyanosis. No R and LLE edema.   Assessment/Plan: 1. Chronic systolic CHF: Nonischemic cardiomyopathy.  Recovered EF 02/2014 55-60%. NYHA I . Doing well.  Continue current dose of lasix, Toprol, lisinopril .  - Restart spironolactone  25 mg daily.  -Check BMET today  Reinforced daily weights, low salt diet, and limiting fluids to < 2 liters per day.   2. Atrial fibrillation:- Regular Rhythm. Cotinue amio 200 mg daily. Continue eliquis. Check TSH, CBC today    3.  CAD: Mild, nonobstructive by cath.  No ASA given Eliquis use.   4. Hyperlipidemia: Continue Pravastatin daily.     Follow up in 6 months.  Savanna Dooley NP-C  12/19/2014

## 2014-12-19 NOTE — Patient Instructions (Signed)
DECREASE Amiodarone to 200 mg once daily.  Refills sent for spironolactone and lisinopril to [facility].  Routine lab work today. Will notify you of abnormal results, otherwise no news is good news!  Return in 3 months for labs.  Follow up 6 months.  Do the following things EVERYDAY: 1) Weigh yourself in the morning before breakfast. Write it down and keep it in a log. 2) Take your medicines as prescribed 3) Eat low salt foods-Limit salt (sodium) to 2000 mg per day.  4) Stay as active as you can everyday 5) Limit all fluids for the day to less than 2 liters

## 2015-01-11 ENCOUNTER — Emergency Department (HOSPITAL_COMMUNITY): Payer: Medicare Other

## 2015-01-11 ENCOUNTER — Encounter (HOSPITAL_COMMUNITY): Payer: Self-pay

## 2015-01-11 ENCOUNTER — Emergency Department (HOSPITAL_COMMUNITY)
Admission: EM | Admit: 2015-01-11 | Discharge: 2015-01-11 | Disposition: A | Discharge status: Home / Self Care | Payer: Medicare Other | Attending: Emergency Medicine | Admitting: Emergency Medicine

## 2015-01-11 ENCOUNTER — Emergency Department (HOSPITAL_BASED_OUTPATIENT_CLINIC_OR_DEPARTMENT_OTHER): Admit: 2015-01-11 | Discharge: 2015-01-11 | Disposition: A | Discharge status: Home / Self Care | Payer: Medicare Other

## 2015-01-11 DIAGNOSIS — E785 Hyperlipidemia, unspecified: Secondary | ICD-10-CM | POA: Diagnosis not present

## 2015-01-11 DIAGNOSIS — M79609 Pain in unspecified limb: Secondary | ICD-10-CM

## 2015-01-11 DIAGNOSIS — M1711 Unilateral primary osteoarthritis, right knee: Secondary | ICD-10-CM | POA: Diagnosis not present

## 2015-01-11 DIAGNOSIS — Z79899 Other long term (current) drug therapy: Secondary | ICD-10-CM | POA: Diagnosis not present

## 2015-01-11 DIAGNOSIS — E119 Type 2 diabetes mellitus without complications: Secondary | ICD-10-CM | POA: Insufficient documentation

## 2015-01-11 DIAGNOSIS — M25561 Pain in right knee: Secondary | ICD-10-CM | POA: Diagnosis present

## 2015-01-11 MED ORDER — HYDROCODONE-ACETAMINOPHEN 5-325 MG PO TABS: 2.0000 | ORAL_TABLET | ORAL | Status: DC | PRN

## 2015-01-11 NOTE — Discharge Instructions (Signed)
Arthritis, Nonspecific °Arthritis is inflammation of a joint. This usually means pain, redness, warmth or swelling are present. One or more joints may be involved. There are a number of types of arthritis. Your caregiver may not be able to tell what type of arthritis you have right away. °CAUSES  °The most common cause of arthritis is the wear and tear on the joint (osteoarthritis). This causes damage to the cartilage, which can break down over time. The knees, hips, back and neck are most often affected by this type of arthritis. °Other types of arthritis and common causes of joint pain include: °· Sprains and other injuries near the joint. Sometimes minor sprains and injuries cause pain and swelling that develop hours later. °· Rheumatoid arthritis. This affects hands, feet and knees. It usually affects both sides of your body at the same time. It is often associated with chronic ailments, fever, weight loss and general weakness. °· Crystal arthritis. Gout and pseudo gout can cause occasional acute severe pain, redness and swelling in the foot, ankle, or knee. °· Infectious arthritis. Bacteria can get into a joint through a break in overlying skin. This can cause infection of the joint. Bacteria and viruses can also spread through the blood and affect your joints. °· Drug, infectious and allergy reactions. Sometimes joints can become mildly painful and slightly swollen with these types of illnesses. °SYMPTOMS  °· Pain is the main symptom. °· Your joint or joints can also be red, swollen and warm or hot to the touch. °· You may have a fever with certain types of arthritis, or even feel overall ill. °· The joint with arthritis will hurt with movement. Stiffness is present with some types of arthritis. °DIAGNOSIS  °Your caregiver will suspect arthritis based on your description of your symptoms and on your exam. Testing may be needed to find the type of arthritis: °· Blood and sometimes urine tests. °· X-ray tests  and sometimes CT or MRI scans. °· Removal of fluid from the joint (arthrocentesis) is done to check for bacteria, crystals or other causes. Your caregiver (or a specialist) will numb the area over the joint with a local anesthetic, and use a needle to remove joint fluid for examination. This procedure is only minimally uncomfortable. °· Even with these tests, your caregiver may not be able to tell what kind of arthritis you have. Consultation with a specialist (rheumatologist) may be helpful. °TREATMENT  °Your caregiver will discuss with you treatment specific to your type of arthritis. If the specific type cannot be determined, then the following general recommendations may apply. °Treatment of severe joint pain includes: °· Rest. °· Elevation. °· Anti-inflammatory medication (for example, ibuprofen) may be prescribed. Avoiding activities that cause increased pain. °· Only take over-the-counter or prescription medicines for pain and discomfort as recommended by your caregiver. °· Cold packs over an inflamed joint may be used for 10 to 15 minutes every hour. Hot packs sometimes feel better, but do not use overnight. Do not use hot packs if you are diabetic without your caregiver's permission. °· A cortisone shot into arthritic joints may help reduce pain and swelling. °· Any acute arthritis that gets worse over the next 1 to 2 days needs to be looked at to be sure there is no joint infection. °Long-term arthritis treatment involves modifying activities and lifestyle to reduce joint stress jarring. This can include weight loss. Also, exercise is needed to nourish the joint cartilage and remove waste. This helps keep the muscles   around the joint strong. °HOME CARE INSTRUCTIONS  °· Do not take aspirin to relieve pain if gout is suspected. This elevates uric acid levels. °· Only take over-the-counter or prescription medicines for pain, discomfort or fever as directed by your caregiver. °· Rest the joint as much as  possible. °· If your joint is swollen, keep it elevated. °· Use crutches if the painful joint is in your leg. °· Drinking plenty of fluids may help for certain types of arthritis. °· Follow your caregiver's dietary instructions. °· Try low-impact exercise such as: °¨ Swimming. °¨ Water aerobics. °¨ Biking. °¨ Walking. °· Morning stiffness is often relieved by a warm shower. °· Put your joints through regular range-of-motion. °SEEK MEDICAL CARE IF:  °· You do not feel better in 24 hours or are getting worse. °· You have side effects to medications, or are not getting better with treatment. °SEEK IMMEDIATE MEDICAL CARE IF:  °· You have a fever. °· You develop severe joint pain, swelling or redness. °· Many joints are involved and become painful and swollen. °· There is severe back pain and/or leg weakness. °· You have loss of bowel or bladder control. °Document Released: 06/12/2004 Document Revised: 07/28/2011 Document Reviewed: 06/28/2008 °Exit[employer]® Patient Information ©2015 [employer]. This information is not intended to replace advice given to you by your health care provider. Make sure you discuss any questions you have with your health care provider. ° °

## 2015-01-11 NOTE — ED Notes (Signed)
Pt reports right knee pain onset one month ago. Denies any recent injuries. Pain started at knee cap and radiates to back of knee, feels like a "pulling" when she walks. Pain is worse with ambulation.

## 2015-01-11 NOTE — Progress Notes (Signed)
VASCULAR LAB PRELIMINARY  PRELIMINARY  PRELIMINARY  PRELIMINARY  Right lower extremity venous duplex completed.    Preliminary report:  There is no DVT or SVT noted in the right lower extremity.  Talitha Dicarlo, RVT 01/11/2015, 7:53 PM

## 2015-01-11 NOTE — ED Provider Notes (Signed)
CSN: [id number]     Arrival date & time 01/11/15  1717 History  This chart was scribed for non-physician practitioner, Alyse Low, PA-C working with Virgel Manifold, MD, by Erling Conte, ED Scribe. This patient was seen in room TR03C/TR03C and the patient's care was started at 6:02 PM.     Chief Complaint  Patient presents with  . Knee Pain    The history is provided by the patient. No language interpreter was used.    HPI Comments: Kimberly [name] is a 59 y.o. female with a h/o aortic stenosis and a-fib who presents to the Emergency Department complaining of constant, right knee pain onset 1 month ago. Pt reports associated swelling to her right leg. She states the pain started at her knee cap and began to radiate to the back of knee. She reports a "pulling" sensation when she walks. Pt states the pain is worse with ambulation. Pt's aortic stenosis was in March 2015. She is currently on anticoagulants due to this previous health problem. Pt has not taken anything for the pain. She denies any recent injuries. Pt denies any known allergies. She denies any color change.  Past Medical History  Diagnosis Date  . Aortic stenosis   . Complication of anesthesia   . PONV (postoperative nausea and vomiting)   . Atrial fibrillation   . NICM (nonischemic cardiomyopathy)     EF 30%  . Arthritis     KNEES  . Morbid obesity   . HLD (hyperlipidemia)   . Diabetes   . Left atrial thrombus    Past Surgical History  Procedure Laterality Date  . Joint replacement    . Tee without cardioversion N/A 07/29/2013    Procedure: TRANSESOPHAGEAL ECHOCARDIOGRAM (TEE);  Surgeon: [name], MD;  Location: [place];  Service: Cardiovascular;  Laterality: N/A;  . Cardioversion N/A 07/29/2013    Procedure: CARDIOVERSION;  Surgeon: [name], MD;  Location: [facility]COPY;  Service: Cardiovascular;  Laterality: N/A;  . Tee without cardioversion N/A 11/11/2013    Procedure: TRANSESOPHAGEAL  ECHOCARDIOGRAM (TEE);  Surgeon: [name], MD;  Location: [place];  Service: Cardiovascular;  Laterality: N/A;  . Cardioversion N/A 11/11/2013    Procedure: CARDIOVERSION;  Surgeon: [name], MD;  Location: [place];  Service: Cardiovascular;  Laterality: N/A;  . Cardioversion N/A 12/08/2013    Procedure: CARDIOVERSION;  Surgeon: [name], MD;  Location: [place];  Service: Cardiovascular;  Laterality: N/A;  . Left heart catheterization with coronary angiogram N/A 08/01/2013    Procedure: LEFT HEART CATHETERIZATION WITH CORONARY ANGIOGRAM;  Surgeon: Blane Ohara, MD;  Location: [facility] CATH LAB;  Service: Cardiovascular;  Laterality: N/A;   Family History  Problem Relation Age of Onset  . CVA Mother    Social History  Substance Use Topics  . Smoking status: Never Smoker   . Smokeless tobacco: Never Used  . Alcohol Use: No   OB History    No data available     Review of Systems  Musculoskeletal: Positive for myalgias, joint swelling, arthralgias and gait problem (due to pain).  Skin: Negative for color change.  All other systems reviewed and are negative.     Allergies  Review of patient's allergies indicates no known allergies.  Home Medications   Prior to Admission medications   Medication Sig Start Date End Date Taking? Authorizing Provider  amiodarone (PACERONE) 200 MG tablet Take 1 tablet (200 mg total) by mouth daily. 12/19/14   Amy D Ninfa Meeker,  NP  Cholecalciferol (VITAMIN D-3) 5000 UNITS TABS Take 5,000 Units by mouth daily.    Historical Provider, MD  docusate sodium (COLACE) 100 MG capsule Take 100 mg by mouth at bedtime.     Historical Provider, MD  ELIQUIS 5 MG TABS tablet TAKE 1 TABLET BY MOUTH TWICE A DAY 09/01/14   Jolaine Artist, MD  Ferrous Sulfate (PX IRON) 27 MG TABS Take 1 tablet by mouth daily.    Historical Provider, MD  furosemide (LASIX) 40 MG tablet Take 1 tablet (40 mg total) by mouth every other day. 12/04/14   Jolaine Artist, MD  lisinopril (PRINIVIL,ZESTRIL) 10 MG tablet Take 1 tablet (10 mg total) by mouth 2 (two) times daily. 12/19/14   Amy D Ninfa Meeker, NP  metoprolol succinate (TOPROL-XL) 50 MG 24 hr tablet TAKE 1 TABLET (50 MG TOTAL) BY MOUTH 2 (TWO) TIMES DAILY. TAKE WITH OR IMMEDIATELY FOLLOWING A MEAL. 04/17/14   Jolaine Artist, MD  nitroGLYCERIN (NITROSTAT) 0.4 MG SL tablet Place 0.4 mg under the tongue every 5 (five) minutes as needed for chest pain.    Historical Provider, MD  Omega-3 Fatty Acids (FISH OIL) 600 MG CAPS Take 600 Units by mouth 2 (two) times daily.    Historical Provider, MD  spironolactone (ALDACTONE) 25 MG tablet Take 1 tablet (25 mg total) by mouth daily. 12/19/14   Amy D Ninfa Meeker, NP   Triage Vitals: BP 128/63 mmHg  Pulse 78  Temp(Src) 98.3 F (36.8 C) (Oral)  Resp 16  SpO2 95%  Physical Exam  Constitutional: She is oriented to person, place, and time. She appears well-developed and well-nourished. No distress.  HENT:  Head: Normocephalic and atraumatic.  Eyes: Conjunctivae and EOM are normal.  Neck: Neck supple. No tracheal deviation present.  Cardiovascular: Normal rate and regular rhythm.   Pulmonary/Chest: Effort normal. No respiratory distress.  Musculoskeletal: Normal range of motion.       Right knee: Tenderness found.       Right upper leg: She exhibits swelling.  Slight swelling to right leg compared to left  Neurological: She is alert and oriented to person, place, and time.  Skin: Skin is warm and dry.  Psychiatric: She has a normal mood and affect. Her behavior is normal.  Nursing note and vitals reviewed.   ED Course  Procedures (including critical care time)  DIAGNOSTIC STUDIES: Oxygen Saturation is 95% on RA, normal by my interpretation.    COORDINATION OF CARE: 6:09 PM- Will order EKG, right knee x-ray and vascular US lower extremity DVT study. Pt advised of plan for treatment and pt agrees.     Labs Review Labs Reviewed - No data to  display  Imaging Review Dg Knee Complete 4 Views Right  01/11/2015   CLINICAL DATA:  Ongoing knee pain for 2 months.  EXAM: RIGHT KNEE - COMPLETE 4+ VIEW  COMPARISON:  None.  FINDINGS: No joint effusion. There is no fracture or subluxation identified. There is tricompartment marginal spur formation with patellofemoral and medial compartment narrowing.  IMPRESSION: 1. No acute findings. 2. Moderate osteoarthritis.   Electronically Signed   By: Kerby Moors M.D.   On: 01/11/2015 19:00   I have personally reviewed and evaluated these images and lab results as part of my medical decision-making.   EKG Interpretation None    vascular doppler,  No dvt.     MDM  Pt counseled on arthritis.  Pt advised to follow up with orthopaedist for evaluation.  Pt  reports she does not take pain medication.     Final diagnoses:  Primary osteoarthritis of right knee     I personally performed the services in this documentation, which was scribed in my presence.  The recorded information has been reviewed and considered.   Ronnald Collum.   Hollace Kinnier Lambert, PA-C 01/11/15 2016  Virgel Manifold, MD 01/18/15 [phone]

## 2015-01-11 NOTE — ED Notes (Signed)
US tech at bedside

## 2015-01-15 ENCOUNTER — Ambulatory Visit (INDEPENDENT_AMBULATORY_CARE_PROVIDER_SITE_OTHER): Payer: Medicare Other | Admitting: Family Medicine

## 2015-01-15 ENCOUNTER — Encounter: Payer: Self-pay | Admitting: Family Medicine

## 2015-01-15 VITALS — BP 91/53 | HR 67 | Ht 66.0 in | Wt 347.0 lb

## 2015-01-15 DIAGNOSIS — M25561 Pain in right knee: Secondary | ICD-10-CM | POA: Diagnosis present

## 2015-01-15 MED ORDER — METHYLPREDNISOLONE ACETATE 40 MG/ML IJ SUSP
40.0000 mg | Freq: Once | INTRAMUSCULAR | Status: AC
  Administered 2015-01-15: 40 mg via INTRA_ARTICULAR

## 2015-01-15 NOTE — Patient Instructions (Signed)
Your knee pain is due to arthritis. Take tylenol 500mg  1-2 tabs three times a day for pain. Glucosamine sulfate 750mg  twice a day is a supplement that may help. Capsaicin, aspercreme, or biofreeze topically up to four times a day may also help with pain. Cortisone injections are an option - you were given one of these today. If cortisone injections do not help, there are different types of shots that may help but they take longer to take effect. It's important that you continue to stay active. Straight leg raises, knee extensions 3 sets of 10 once a day (add ankle weight if these become too easy). Consider physical therapy to strengthen muscles around the joint that hurts to take pressure off of the joint itself. Shoe inserts with good arch support may be helpful. Walker or cane if needed. Heat or ice 15 minutes at a time 3-4 times a day as needed to help with pain. Water aerobics and cycling with low resistance are the best two types of exercise for arthritis. Follow up with me in 1 month.

## 2015-01-17 DIAGNOSIS — M25561 Pain in right knee: Secondary | ICD-10-CM | POA: Insufficient documentation

## 2015-01-17 NOTE — Assessment & Plan Note (Signed)
2/2 DJD confirmed by radiographs.  Discussed options - went ahead with injection today.  We discussed tylenol, topical medications, glucosamine.  Shown home exercises to do daily.  F/u in 1 month.  See instructions for further.  After informed written consent, patient was seated on exam table. Right knee was prepped with alcohol swab and utilizing anteromedial approach, patient's right knee was injected intraarticularly with 3:1 marcaine: depomedrol. Patient tolerated the procedure well without immediate complications.

## 2015-01-17 NOTE — Progress Notes (Signed)
PCP: Omer Jack, MD  Subjective:   HPI: Patient is a 59 y.o. female here for right knee pain.  Patient reports having about 2 months of right knee pain. Worsened to a 10/10 level now. Remote injury in 2000 - had this knee scoped and did well since then. History of left knee replacement. + swelling right knee. No catching, locking, giving out.  Past Medical History  Diagnosis Date  . Aortic stenosis   . Complication of anesthesia   . PONV (postoperative nausea and vomiting)   . Atrial fibrillation   . NICM (nonischemic cardiomyopathy)     EF 30%  . Arthritis     KNEES  . Morbid obesity   . HLD (hyperlipidemia)   . Diabetes   . Left atrial thrombus     Current Outpatient Prescriptions on File Prior to Visit  Medication Sig Dispense Refill  . amiodarone (PACERONE) 200 MG tablet Take 1 tablet (200 mg total) by mouth daily. 90 tablet 3  . Cholecalciferol (VITAMIN D-3) 5000 UNITS TABS Take 5,000 Units by mouth daily.    [name] docusate sodium (COLACE) 100 MG capsule Take 100 mg by mouth at bedtime.     [name] ELIQUIS 5 MG TABS tablet TAKE 1 TABLET BY MOUTH TWICE A DAY 60 tablet 5  . Ferrous Sulfate (PX IRON) 27 MG TABS Take 1 tablet by mouth daily.    . furosemide (LASIX) 40 MG tablet Take 1 tablet (40 mg total) by mouth every other day. 15 tablet 6  . HYDROcodone-acetaminophen (NORCO/VICODIN) 5-325 MG per tablet Take 2 tablets by mouth every 4 (four) hours as needed. 16 tablet 0  . lisinopril (PRINIVIL,ZESTRIL) 10 MG tablet Take 1 tablet (10 mg total) by mouth 2 (two) times daily. 60 tablet 6  . metoprolol succinate (TOPROL-XL) 50 MG 24 hr tablet TAKE 1 TABLET (50 MG TOTAL) BY MOUTH 2 (TWO) TIMES DAILY. TAKE WITH OR IMMEDIATELY FOLLOWING A MEAL. 180 tablet 3  . nitroGLYCERIN (NITROSTAT) 0.4 MG SL tablet Place 0.4 mg under the tongue every 5 (five) minutes as needed for chest pain.    . Omega-3 Fatty Acids (FISH OIL) 600 MG CAPS Take 600 Units by mouth 2 (two) times daily.    [name]  spironolactone (ALDACTONE) 25 MG tablet Take 1 tablet (25 mg total) by mouth daily. 90 tablet 3   No current facility-administered medications on file prior to visit.    Past Surgical History  Procedure Laterality Date  . Joint replacement    . Tee without cardioversion N/A 07/29/2013    Procedure: TRANSESOPHAGEAL ECHOCARDIOGRAM (TEE);  Surgeon: [name], MD;  Location: [place];  Service: Cardiovascular;  Laterality: N/A;  . Cardioversion N/A 07/29/2013    Procedure: CARDIOVERSION;  Surgeon: [name], MD;  Location: [facility] ENDOSCOPY;  Service: Cardiovascular;  Laterality: N/A;  . Tee without cardioversion N/A 11/11/2013    Procedure: TRANSESOPHAGEAL ECHOCARDIOGRAM (TEE);  Surgeon: [name], MD;  Location: [place];  Service: Cardiovascular;  Laterality: N/A;  . Cardioversion N/A 11/11/2013    Procedure: CARDIOVERSION;  Surgeon: [name], MD;  Location: [place];  Service: Cardiovascular;  Laterality: N/A;  . Cardioversion N/A 12/08/2013    Procedure: CARDIOVERSION;  Surgeon: [name], MD;  Location: [place];  Service: Cardiovascular;  Laterality: N/A;  . Left heart catheterization with coronary angiogram N/A 08/01/2013    Procedure: LEFT HEART CATHETERIZATION WITH CORONARY ANGIOGRAM;  Surgeon: Blane Ohara, MD;  Location: [place] CATH LAB;  Service:  Cardiovascular;  Laterality: N/A;    No Known Allergies  Social History   Social History  . Marital Status: Single    Spouse Name: N/A  . Number of Children: N/A  . Years of Education: N/A   Occupational History  . Not on file.   Social History Main Topics  . Smoking status: Never Smoker   . Smokeless tobacco: Never Used  . Alcohol Use: No  . Drug Use: No  . Sexual Activity: Not Currently   Other Topics Concern  . Not on file   Social History Narrative    Family History  Problem Relation Age of Onset  . CVA Mother     BP 91/53 mmHg  Pulse 67  Ht 5\' 6"  (1.676 m)  Wt 347  lb (157.398 kg)  BMI 56.03 kg/m2  Review of Systems: See HPI above.    Objective:  Physical Exam:  Gen: NAD  Right knee: No gross deformity, ecchymoses, swelling. TTP medial joint line, post patellar facets. FROM. Negative ant/post drawers. Negative valgus/varus testing. Negative lachmanns. Negative mcmurrays, apleys, patellar apprehension. NV intact distally.    Assessment & Plan:  1. Right knee pain - 2/2 DJD confirmed by radiographs.  Discussed options - went ahead with injection today.  We discussed tylenol, topical medications, glucosamine.  Shown home exercises to do daily.  F/u in 1 month.  See instructions for further.  After informed written consent, patient was seated on exam table. Right knee was prepped with alcohol swab and utilizing anteromedial approach, patient's right knee was injected intraarticularly with 3:1 marcaine: depomedrol. Patient tolerated the procedure well without immediate complications.

## 2015-02-13 ENCOUNTER — Ambulatory Visit (INDEPENDENT_AMBULATORY_CARE_PROVIDER_SITE_OTHER): Payer: Medicare Other | Admitting: Family Medicine

## 2015-02-13 ENCOUNTER — Encounter: Payer: Self-pay | Admitting: Family Medicine

## 2015-02-13 VITALS — BP 108/73 | HR 62 | Ht 66.0 in | Wt 346.0 lb

## 2015-02-13 DIAGNOSIS — M25561 Pain in right knee: Secondary | ICD-10-CM | POA: Diagnosis present

## 2015-02-13 IMAGING — CR DG CHEST 2V
2 series · 2 of 2 positions shown · non-contrast
Comparison: Chest radiograph September 26, 2003 available; images from
that study not available.

CLINICAL DATA: Chest pain and difficulty breathing

EXAM:
CHEST  2 VIEW

[w chest pa]
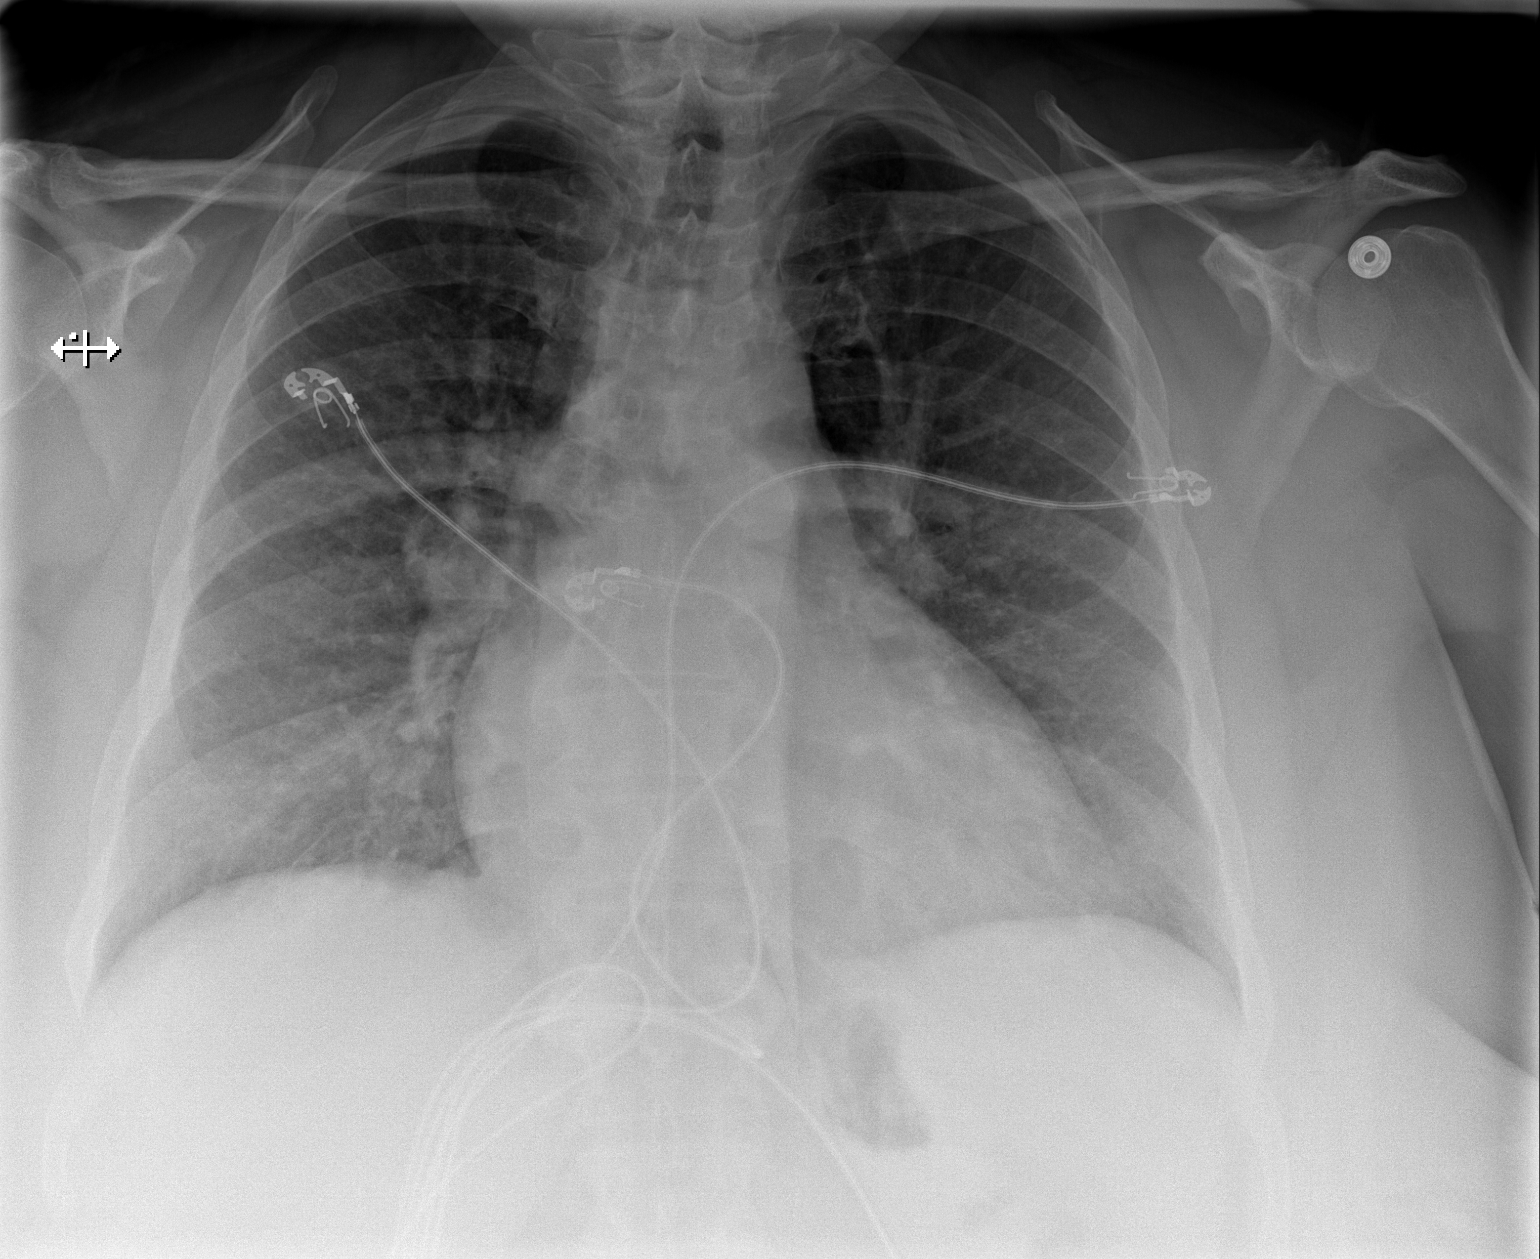

[w chest lat]
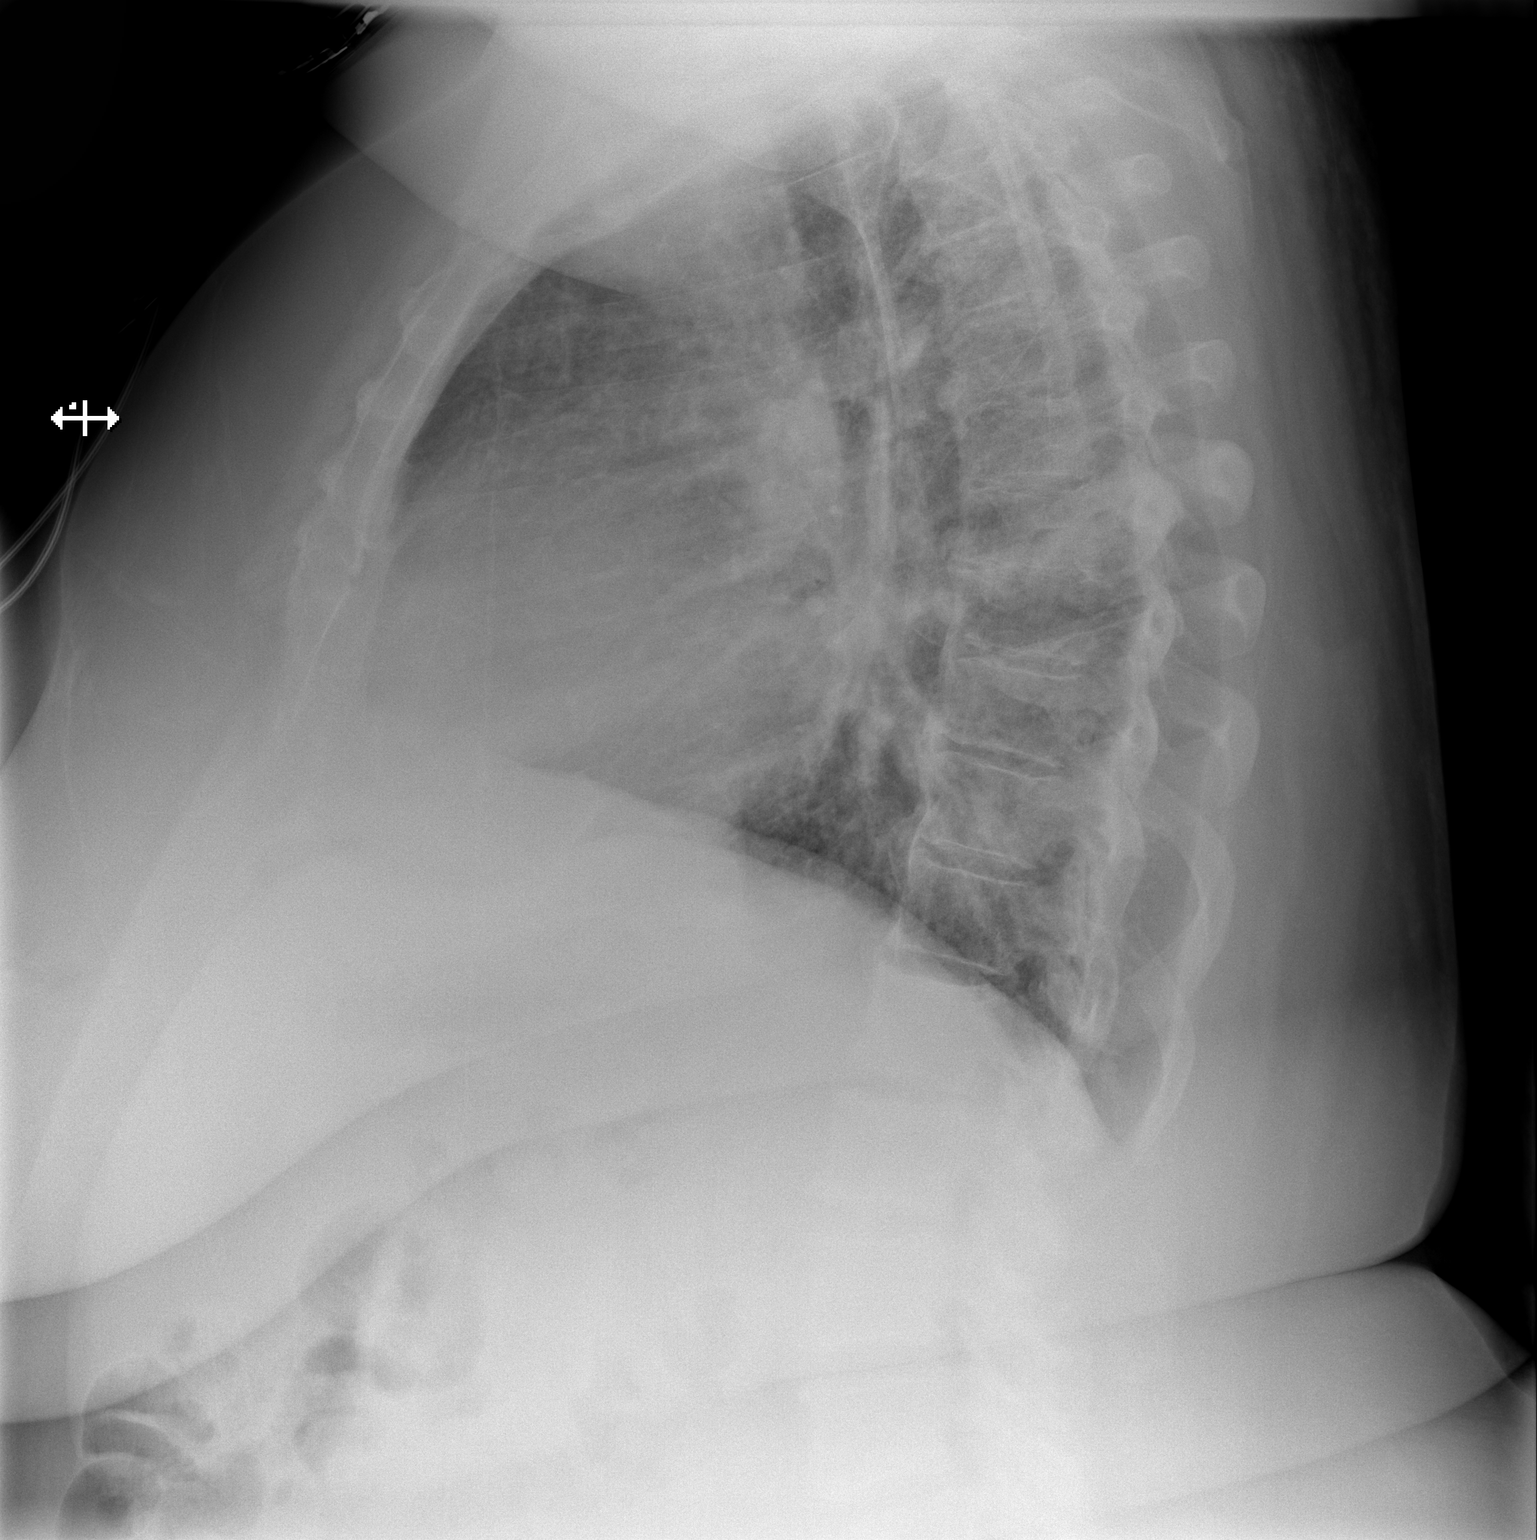

[2 of 2 positions shown; findings below may reference images not displayed]

FINDINGS: There is relative flattening hemidiaphragms suggesting underlying
emphysematous change. There is no edema or consolidation. Heart is
borderline enlarged with pulmonary vascularity within normal limits.
No adenopathy. No bone lesions.
IMPRESSION: Heart borderline enlarged. Suspect underlying emphysematous change.
No edema or consolidation.

## 2015-02-13 NOTE — Patient Instructions (Signed)
We will go ahead with the gel shots asap but we have to get approval from your insurance and get these mailed to [place] first. Nevin Bloodgood will call you when we get this and we will try to see you that day if it comes early in the day.

## 2015-02-14 NOTE — Progress Notes (Signed)
PCP: Omer Jack, MD  Subjective:   HPI: Patient is a 59 y.o. female here for right knee pain.  8/29: Patient reports having about 2 months of right knee pain. Worsened to a 10/10 level now. Remote injury in 2000 - had this knee scoped and did well since then. History of left knee replacement. + swelling right knee. No catching, locking, giving out.  9/27: Patient reports she continues to struggle with 8/10 level of pain. Cortisone injection only helped for 1-2 days. Some swelling. Worse pain at nighttime. No catching, locking, giving out.  Past Medical History  Diagnosis Date  . Aortic stenosis   . Complication of anesthesia   . PONV (postoperative nausea and vomiting)   . Atrial fibrillation   . NICM (nonischemic cardiomyopathy)     EF 30%  . Arthritis     KNEES  . Morbid obesity   . HLD (hyperlipidemia)   . Diabetes   . Left atrial thrombus     Current Outpatient Prescriptions on File Prior to Visit  Medication Sig Dispense Refill  . amiodarone (PACERONE) 200 MG tablet Take 1 tablet (200 mg total) by mouth daily. 90 tablet 3  . Cholecalciferol (VITAMIN D-3) 5000 UNITS TABS Take 5,000 Units by mouth daily.    . CRESTOR 10 MG tablet Take 10 mg by mouth at bedtime.  2  . docusate sodium (COLACE) 100 MG capsule Take 100 mg by mouth at bedtime.     [name] ELIQUIS 5 MG TABS tablet TAKE 1 TABLET BY MOUTH TWICE A DAY 60 tablet 5  . Ferrous Sulfate (PX IRON) 27 MG TABS Take 1 tablet by mouth daily.    . furosemide (LASIX) 40 MG tablet Take 1 tablet (40 mg total) by mouth every other day. 15 tablet 6  . HYDROcodone-acetaminophen (NORCO/VICODIN) 5-325 MG per tablet Take 2 tablets by mouth every 4 (four) hours as needed. 16 tablet 0  . lisinopril (PRINIVIL,ZESTRIL) 10 MG tablet Take 1 tablet (10 mg total) by mouth 2 (two) times daily. 60 tablet 6  . metoprolol succinate (TOPROL-XL) 50 MG 24 hr tablet TAKE 1 TABLET (50 MG TOTAL) BY MOUTH 2 (TWO) TIMES DAILY. TAKE WITH OR  IMMEDIATELY FOLLOWING A MEAL. 180 tablet 3  . nitroGLYCERIN (NITROSTAT) 0.4 MG SL tablet Place 0.4 mg under the tongue every 5 (five) minutes as needed for chest pain.    . Omega-3 Fatty Acids (FISH OIL) 600 MG CAPS Take 600 Units by mouth 2 (two) times daily.    [name] spironolactone (ALDACTONE) 25 MG tablet Take 1 tablet (25 mg total) by mouth daily. 90 tablet 3   No current facility-administered medications on file prior to visit.    Past Surgical History  Procedure Laterality Date  . Joint replacement    . Tee without cardioversion N/A 07/29/2013    Procedure: TRANSESOPHAGEAL ECHOCARDIOGRAM (TEE);  Surgeon: [name], MD;  Location: [place];  Service: Cardiovascular;  Laterality: N/A;  . Cardioversion N/A 07/29/2013    Procedure: CARDIOVERSION;  Surgeon: [name], MD;  Location: [facility] ENDOSCOPY;  Service: Cardiovascular;  Laterality: N/A;  . Tee without cardioversion N/A 11/11/2013    Procedure: TRANSESOPHAGEAL ECHOCARDIOGRAM (TEE);  Surgeon: [name], MD;  Location: [place];  Service: Cardiovascular;  Laterality: N/A;  . Cardioversion N/A 11/11/2013    Procedure: CARDIOVERSION;  Surgeon: [name], MD;  Location: [place];  Service: Cardiovascular;  Laterality: N/A;  . Cardioversion N/A 12/08/2013    Procedure: CARDIOVERSION;  Surgeon:  [name], MD;  Location: [place];  Service: Cardiovascular;  Laterality: N/A;  . Left heart catheterization with coronary angiogram N/A 08/01/2013    Procedure: LEFT HEART CATHETERIZATION WITH CORONARY ANGIOGRAM;  Surgeon: Blane Ohara, MD;  Location: [facility] CATH LAB;  Service: Cardiovascular;  Laterality: N/A;    No Known Allergies  Social History   Social History  . Marital Status: Single    Spouse Name: N/A  . Number of Children: N/A  . Years of Education: N/A   Occupational History  . Not on file.   Social History Main Topics  . Smoking status: Never Smoker   . Smokeless tobacco: Never Used  .  Alcohol Use: No  . Drug Use: No  . Sexual Activity: Not Currently   Other Topics Concern  . Not on file   Social History Narrative    Family History  Problem Relation Age of Onset  . CVA Mother     BP 108/73 mmHg  Pulse 62  Ht 5\' 6"  (1.676 m)  Wt 346 lb (156.945 kg)  BMI 55.87 kg/m2  Review of Systems: See HPI above.    Objective:  Physical Exam:  Gen: NAD  Right knee: No gross deformity, ecchymoses, swelling. TTP medial joint line, post patellar facets. FROM. Negative ant/post drawers. Negative valgus/varus testing. Negative lachmanns. Negative mcmurrays, apleys, patellar apprehension. NV intact distally.    Assessment & Plan:  1. Right knee pain - 2/2 DJD confirmed by radiographs.  Unfortunately not improving following cortisone injection.  Will get approval for viscosupplementation and start this series.

## 2015-02-14 NOTE — Assessment & Plan Note (Signed)
2/2 DJD confirmed by radiographs.  Unfortunately not improving following cortisone injection.  Will get approval for viscosupplementation and start this series.

## 2015-02-15 ENCOUNTER — Ambulatory Visit (INDEPENDENT_AMBULATORY_CARE_PROVIDER_SITE_OTHER): Payer: Medicare Other | Admitting: Family Medicine

## 2015-02-15 ENCOUNTER — Ambulatory Visit: Payer: Medicare Other | Admitting: Family Medicine

## 2015-02-15 DIAGNOSIS — M1711 Unilateral primary osteoarthritis, right knee: Secondary | ICD-10-CM

## 2015-02-15 DIAGNOSIS — M129 Arthropathy, unspecified: Secondary | ICD-10-CM

## 2015-02-15 MED ORDER — SODIUM HYALURONATE (VISCOSUP) 25 MG/2.5ML IX SOSY
2.5000 mL | PREFILLED_SYRINGE | Freq: Once | INTRA_ARTICULAR | Status: AC
  Administered 2015-02-15: 2.5 mL via INTRA_ARTICULAR

## 2015-02-15 NOTE — Progress Notes (Signed)
PCP: Omer Jack, MD  Subjective:   HPI: Patient is a 59 y.o. female here for right knee pain.  8/29: Patient reports having about 2 months of right knee pain. Worsened to a 10/10 level now. Remote injury in 2000 - had this knee scoped and did well since then. History of left knee replacement. + swelling right knee. No catching, locking, giving out.  9/27: Patient reports she continues to struggle with 8/10 level of pain. Cortisone injection only helped for 1-2 days. Some swelling. Worse pain at nighttime. No catching, locking, giving out.  9/29: Patient returns to start supartz series.  Past Medical History  Diagnosis Date  . Aortic stenosis   . Complication of anesthesia   . PONV (postoperative nausea and vomiting)   . Atrial fibrillation   . NICM (nonischemic cardiomyopathy)     EF 30%  . Arthritis     KNEES  . Morbid obesity   . HLD (hyperlipidemia)   . Diabetes   . Left atrial thrombus     Current Outpatient Prescriptions on File Prior to Visit  Medication Sig Dispense Refill  . amiodarone (PACERONE) 200 MG tablet Take 1 tablet (200 mg total) by mouth daily. 90 tablet 3  . Cholecalciferol (VITAMIN D-3) 5000 UNITS TABS Take 5,000 Units by mouth daily.    . CRESTOR 10 MG tablet Take 10 mg by mouth at bedtime.  2  . docusate sodium (COLACE) 100 MG capsule Take 100 mg by mouth at bedtime.     [name] ELIQUIS 5 MG TABS tablet TAKE 1 TABLET BY MOUTH TWICE A DAY 60 tablet 5  . Ferrous Sulfate (PX IRON) 27 MG TABS Take 1 tablet by mouth daily.    . furosemide (LASIX) 40 MG tablet Take 1 tablet (40 mg total) by mouth every other day. 15 tablet 6  . HYDROcodone-acetaminophen (NORCO/VICODIN) 5-325 MG per tablet Take 2 tablets by mouth every 4 (four) hours as needed. 16 tablet 0  . lisinopril (PRINIVIL,ZESTRIL) 10 MG tablet Take 1 tablet (10 mg total) by mouth 2 (two) times daily. 60 tablet 6  . metoprolol succinate (TOPROL-XL) 50 MG 24 hr tablet TAKE 1 TABLET (50 MG  TOTAL) BY MOUTH 2 (TWO) TIMES DAILY. TAKE WITH OR IMMEDIATELY FOLLOWING A MEAL. 180 tablet 3  . nitroGLYCERIN (NITROSTAT) 0.4 MG SL tablet Place 0.4 mg under the tongue every 5 (five) minutes as needed for chest pain.    . Omega-3 Fatty Acids (FISH OIL) 600 MG CAPS Take 600 Units by mouth 2 (two) times daily.    [name] spironolactone (ALDACTONE) 25 MG tablet Take 1 tablet (25 mg total) by mouth daily. 90 tablet 3   No current facility-administered medications on file prior to visit.    Past Surgical History  Procedure Laterality Date  . Joint replacement    . Tee without cardioversion N/A 07/29/2013    Procedure: TRANSESOPHAGEAL ECHOCARDIOGRAM (TEE);  Surgeon: [name], MD;  Location: [place];  Service: Cardiovascular;  Laterality: N/A;  . Cardioversion N/A 07/29/2013    Procedure: CARDIOVERSION;  Surgeon: [name], MD;  Location: [facility] ENDOSCOPY;  Service: Cardiovascular;  Laterality: N/A;  . Tee without cardioversion N/A 11/11/2013    Procedure: TRANSESOPHAGEAL ECHOCARDIOGRAM (TEE);  Surgeon: [name], MD;  Location: [place];  Service: Cardiovascular;  Laterality: N/A;  . Cardioversion N/A 11/11/2013    Procedure: CARDIOVERSION;  Surgeon: [name], MD;  Location: [place];  Service: Cardiovascular;  Laterality: N/A;  . Cardioversion N/A  12/08/2013    Procedure: CARDIOVERSION;  Surgeon: [name], MD;  Location: [facility] ENDOSCOPY;  Service: Cardiovascular;  Laterality: N/A;  . Left heart catheterization with coronary angiogram N/A 08/01/2013    Procedure: LEFT HEART CATHETERIZATION WITH CORONARY ANGIOGRAM;  Surgeon: Blane Ohara, MD;  Location: [facility] CATH LAB;  Service: Cardiovascular;  Laterality: N/A;    No Known Allergies  Social History   Social History  . Marital Status: Single    Spouse Name: N/A  . Number of Children: N/A  . Years of Education: N/A   Occupational History  . Not on file.   Social History Main Topics  . Smoking status:  Never Smoker   . Smokeless tobacco: Never Used  . Alcohol Use: No  . Drug Use: No  . Sexual Activity: Not Currently   Other Topics Concern  . Not on file   Social History Narrative    Family History  Problem Relation Age of Onset  . CVA Mother     There were no vitals taken for this visit.  Review of Systems: See HPI above.    Objective:  Physical Exam:  Gen: NAD Exam not repeated today.  Right knee: No gross deformity, ecchymoses, swelling. TTP medial joint line, post patellar facets. FROM. Negative ant/post drawers. Negative valgus/varus testing. Negative lachmanns. Negative mcmurrays, apleys, patellar apprehension. NV intact distally.    Assessment & Plan:  1. Right knee osteoarthritis - starting supartz series today.  F/u in 1 week for second injection.  After informed written consent, patient was seated on exam table. Right knee was prepped with alcohol swab and utilizing anteromedial approach, patient's right knee was injected intraarticularly with 60ml marcaine followed by Jacklyn Shell. Patient tolerated the procedure well without immediate complications.

## 2015-02-15 NOTE — Addendum Note (Signed)
Addended by: Sherrie George F on: 02/15/2015 04:59 PM   Modules accepted: Orders

## 2015-02-15 NOTE — Assessment & Plan Note (Signed)
starting supartz series today.  F/u in 1 week for second injection.  After informed written consent, patient was seated on exam table. Right knee was prepped with alcohol swab and utilizing anteromedial approach, patient's right knee was injected intraarticularly with 74ml marcaine followed by Jacklyn Shell. Patient tolerated the procedure well without immediate complications.

## 2015-02-22 ENCOUNTER — Ambulatory Visit (INDEPENDENT_AMBULATORY_CARE_PROVIDER_SITE_OTHER): Payer: Medicare Other | Admitting: Family Medicine

## 2015-02-22 ENCOUNTER — Encounter: Payer: Self-pay | Admitting: Family Medicine

## 2015-02-22 VITALS — BP 93/61 | HR 73 | Ht 66.0 in | Wt 344.0 lb

## 2015-02-22 DIAGNOSIS — M1711 Unilateral primary osteoarthritis, right knee: Secondary | ICD-10-CM

## 2015-02-22 DIAGNOSIS — M129 Arthropathy, unspecified: Secondary | ICD-10-CM

## 2015-02-22 MED ORDER — SODIUM HYALURONATE (VISCOSUP) 25 MG/2.5ML IX SOSY
2.5000 mL | PREFILLED_SYRINGE | Freq: Once | INTRA_ARTICULAR | Status: AC
  Administered 2015-02-22: 2.5 mL via INTRA_ARTICULAR

## 2015-02-26 NOTE — Assessment & Plan Note (Signed)
second supartz injection given today.  Will follow-up in 1 week for third injection.  After informed written consent, patient was seated on exam table. Right knee was prepped with alcohol swab and utilizing anteromedial approach, patient's right knee was injected intraarticularly with 68ml marcaine followed by Jacklyn Shell. Patient tolerated the procedure well without immediate complications.

## 2015-02-26 NOTE — Progress Notes (Signed)
PCP: Omer Jack, MD  Subjective:   HPI: Patient is a 59 y.o. female here for right knee pain.  8/29: Patient reports having about 2 months of right knee pain. Worsened to a 10/10 level now. Remote injury in 2000 - had this knee scoped and did well since then. History of left knee replacement. + swelling right knee. No catching, locking, giving out.  9/27: Patient reports she continues to struggle with 8/10 level of pain. Cortisone injection only helped for 1-2 days. Some swelling. Worse pain at nighttime. No catching, locking, giving out.  9/29: Patient returns to start supartz series.  10/6: Patient here for second supartz injection. She states she's had some improvement already. No pain currently.  Past Medical History  Diagnosis Date  . Aortic stenosis   . Complication of anesthesia   . PONV (postoperative nausea and vomiting)   . Atrial fibrillation ([place])   . NICM (nonischemic cardiomyopathy) ([place])     EF 30%  . Arthritis     KNEES  . Morbid obesity ([place])   . HLD (hyperlipidemia)   . Diabetes ([place])   . Left atrial thrombus [facility])     Current Outpatient Prescriptions on File Prior to Visit  Medication Sig Dispense Refill  . amiodarone (PACERONE) 200 MG tablet Take 1 tablet (200 mg total) by mouth daily. 90 tablet 3  . Cholecalciferol (VITAMIN D-3) 5000 UNITS TABS Take 5,000 Units by mouth daily.    . CRESTOR 10 MG tablet Take 10 mg by mouth at bedtime.  2  . docusate sodium (COLACE) 100 MG capsule Take 100 mg by mouth at bedtime.     [name] ELIQUIS 5 MG TABS tablet TAKE 1 TABLET BY MOUTH TWICE A DAY 60 tablet 5  . Ferrous Sulfate (PX IRON) 27 MG TABS Take 1 tablet by mouth daily.    . furosemide (LASIX) 40 MG tablet Take 1 tablet (40 mg total) by mouth every other day. 15 tablet 6  . HYDROcodone-acetaminophen (NORCO/VICODIN) 5-325 MG per tablet Take 2 tablets by mouth every 4 (four) hours as needed. 16 tablet 0  . lisinopril (PRINIVIL,ZESTRIL) 10 MG tablet  Take 1 tablet (10 mg total) by mouth 2 (two) times daily. 60 tablet 6  . metoprolol succinate (TOPROL-XL) 50 MG 24 hr tablet TAKE 1 TABLET (50 MG TOTAL) BY MOUTH 2 (TWO) TIMES DAILY. TAKE WITH OR IMMEDIATELY FOLLOWING A MEAL. 180 tablet 3  . nitroGLYCERIN (NITROSTAT) 0.4 MG SL tablet Place 0.4 mg under the tongue every 5 (five) minutes as needed for chest pain.    . Omega-3 Fatty Acids (FISH OIL) 600 MG CAPS Take 600 Units by mouth 2 (two) times daily.    [name] spironolactone (ALDACTONE) 25 MG tablet Take 1 tablet (25 mg total) by mouth daily. 90 tablet 3   No current facility-administered medications on file prior to visit.    Past Surgical History  Procedure Laterality Date  . Joint replacement    . Tee without cardioversion N/A 07/29/2013    Procedure: TRANSESOPHAGEAL ECHOCARDIOGRAM (TEE);  Surgeon: [name], MD;  Location: [place];  Service: Cardiovascular;  Laterality: N/A;  . Cardioversion N/A 07/29/2013    Procedure: CARDIOVERSION;  Surgeon: [name], MD;  Location: [facility] ENDOSCOPY;  Service: Cardiovascular;  Laterality: N/A;  . Tee without cardioversion N/A 11/11/2013    Procedure: TRANSESOPHAGEAL ECHOCARDIOGRAM (TEE);  Surgeon: [name], MD;  Location: [place];  Service: Cardiovascular;  Laterality: N/A;  . Cardioversion N/A 11/11/2013  Procedure: CARDIOVERSION;  Surgeon: [name], MD;  Location: [place];  Service: Cardiovascular;  Laterality: N/A;  . Cardioversion N/A 12/08/2013    Procedure: CARDIOVERSION;  Surgeon: [name], MD;  Location: [place];  Service: Cardiovascular;  Laterality: N/A;  . Left heart catheterization with coronary angiogram N/A 08/01/2013    Procedure: LEFT HEART CATHETERIZATION WITH CORONARY ANGIOGRAM;  Surgeon: Blane Ohara, MD;  Location: [facility] CATH LAB;  Service: Cardiovascular;  Laterality: N/A;    No Known Allergies  Social History   Social History  . Marital Status: Single    Spouse Name: N/A  .  Number of Children: N/A  . Years of Education: N/A   Occupational History  . Not on file.   Social History Main Topics  . Smoking status: Never Smoker   . Smokeless tobacco: Never Used  . Alcohol Use: No  . Drug Use: No  . Sexual Activity: Not Currently   Other Topics Concern  . Not on file   Social History Narrative    Family History  Problem Relation Age of Onset  . CVA Mother     BP 93/61 mmHg  Pulse 73  Ht 5\' 6"  (1.676 m)  Wt 344 lb (156.037 kg)  BMI 55.55 kg/m2  Review of Systems: See HPI above.    Objective:  Physical Exam:  Gen: NAD Exam not repeated today.  Right knee: No gross deformity, ecchymoses, swelling. TTP medial joint line, post patellar facets. FROM. Negative ant/post drawers. Negative valgus/varus testing. Negative lachmanns. Negative mcmurrays, apleys, patellar apprehension. NV intact distally.    Assessment & Plan:  1. Right knee osteoarthritis - second supartz injection given today.  Will follow-up in 1 week for third injection.  After informed written consent, patient was seated on exam table. Right knee was prepped with alcohol swab and utilizing anteromedial approach, patient's right knee was injected intraarticularly with 81ml marcaine followed by Jacklyn Shell. Patient tolerated the procedure well without immediate complications.

## 2015-03-01 ENCOUNTER — Encounter: Payer: Self-pay | Admitting: Family Medicine

## 2015-03-01 ENCOUNTER — Ambulatory Visit (INDEPENDENT_AMBULATORY_CARE_PROVIDER_SITE_OTHER): Payer: Medicare Other | Admitting: Family Medicine

## 2015-03-01 VITALS — BP 92/63 | Ht 66.0 in | Wt 344.0 lb

## 2015-03-01 DIAGNOSIS — M129 Arthropathy, unspecified: Secondary | ICD-10-CM

## 2015-03-01 DIAGNOSIS — M1711 Unilateral primary osteoarthritis, right knee: Secondary | ICD-10-CM | POA: Diagnosis present

## 2015-03-01 MED ORDER — SODIUM HYALURONATE (VISCOSUP) 25 MG/2.5ML IX SOSY
2.5000 mL | PREFILLED_SYRINGE | Freq: Once | INTRA_ARTICULAR | Status: AC
  Administered 2015-03-01: 2.5 mL via INTRA_ARTICULAR

## 2015-03-01 NOTE — Assessment & Plan Note (Signed)
third supartz injection given today.  Will follow-up in 4 weeks.  After informed written consent, patient was seated on exam table. Right knee was prepped with alcohol swab and utilizing anteromedial approach, patient's right knee was injected intraarticularly with 51ml marcaine followed by Jacklyn Shell. Patient tolerated the procedure well without immediate complications.

## 2015-03-01 NOTE — Progress Notes (Signed)
PCP: Omer Jack, MD  Subjective:   HPI: Patient is a 59 y.o. female here for right knee pain.  8/29: Patient reports having about 2 months of right knee pain. Worsened to a 10/10 level now. Remote injury in 2000 - had this knee scoped and did well since then. History of left knee replacement. + swelling right knee. No catching, locking, giving out.  9/27: Patient reports she continues to struggle with 8/10 level of pain. Cortisone injection only helped for 1-2 days. Some swelling. Worse pain at nighttime. No catching, locking, giving out.  9/29: Patient returns to start supartz series.  10/6: Patient here for second supartz injection. She states she's had some improvement already. No pain currently.  10/13: Patient returns for third injection of supartz. No pain currently.  Past Medical History  Diagnosis Date  . Aortic stenosis   . Complication of anesthesia   . PONV (postoperative nausea and vomiting)   . Atrial fibrillation ([place])   . NICM (nonischemic cardiomyopathy) ([place])     EF 30%  . Arthritis     KNEES  . Morbid obesity ([place])   . HLD (hyperlipidemia)   . Diabetes ([place])   . 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 amiodarone (PACERONE) 200 MG tablet Take 1 tablet (200 mg total) by mouth daily. 90 tablet 3  . Cholecalciferol (VITAMIN D-3) 5000 UNITS TABS Take 5,000 Units by mouth daily.    . CRESTOR 10 MG tablet Take 10 mg by mouth at bedtime.  2  . docusate sodium (COLACE) 100 MG capsule Take 100 mg by mouth at bedtime.     [name] ELIQUIS 5 MG TABS tablet TAKE 1 TABLET BY MOUTH TWICE A DAY 60 tablet 5  . Ferrous Sulfate (PX IRON) 27 MG TABS Take 1 tablet by mouth daily.    . furosemide (LASIX) 40 MG tablet Take 1 tablet (40 mg total) by mouth every other day. 15 tablet 6  . HYDROcodone-acetaminophen (NORCO/VICODIN) 5-325 MG per tablet Take 2 tablets by mouth every 4  (four) hours as needed. 16 tablet 0  . lisinopril (PRINIVIL,ZESTRIL) 10 MG tablet Take 1 tablet (10 mg total) by mouth 2 (two) times daily. 60 tablet 6  . metoprolol succinate (TOPROL-XL) 50 MG 24 hr tablet TAKE 1 TABLET (50 MG TOTAL) BY MOUTH 2 (TWO) TIMES DAILY. TAKE WITH OR IMMEDIATELY FOLLOWING A MEAL. 180 tablet 3  . nitroGLYCERIN (NITROSTAT) 0.4 MG SL tablet Place 0.4 mg under the tongue every 5 (five) minutes as needed for chest pain.    . Omega-3 Fatty Acids (FISH OIL) 600 MG CAPS Take 600 Units by mouth 2 (two) times daily.    [name] spironolactone (ALDACTONE) 25 MG tablet Take 1 tablet (25 mg total) by mouth daily. 90 tablet 3   No current facility-administered medications on file prior to visit.    Past Surgical History  Procedure Laterality Date  . Joint replacement    . Tee without cardioversion N/A 07/29/2013    Procedure: TRANSESOPHAGEAL ECHOCARDIOGRAM (TEE);  Surgeon: [name], MD;  Location: [facility];  Service: Cardiovascular;  Laterality: N/A;  . Cardioversion N/A 07/29/2013    Procedure: CARDIOVERSION;  Surgeon: [name], MD;  Location: [facility] ENDOSCOPY;  Service: Cardiovascular;  Laterality: N/A;  . Tee without cardioversion N/A 11/11/2013    Procedure: TRANSESOPHAGEAL ECHOCARDIOGRAM (TEE);  Surgeon: [name], MD;  Location: [place];  Service: Cardiovascular;  Laterality: N/A;  . Cardioversion N/A 11/11/2013    Procedure: CARDIOVERSION;  Surgeon: [name], MD;  Location: [place];  Service: Cardiovascular;  Laterality: N/A;  . Cardioversion N/A 12/08/2013    Procedure: CARDIOVERSION;  Surgeon: [name], MD;  Location: [place];  Service: Cardiovascular;  Laterality: N/A;  . Left heart catheterization with coronary angiogram N/A 08/01/2013    Procedure: LEFT HEART CATHETERIZATION WITH CORONARY ANGIOGRAM;  Surgeon: Blane Ohara, MD;  Location: [facility] CATH LAB;  Service: Cardiovascular;  Laterality: N/A;    No Known  Allergies  Social History   Social History  . Marital Status: Single    Spouse Name: N/A  . Number of Children: N/A  . Years of Education: N/A   Occupational History  . Not on file.   Social History Main Topics  . Smoking status: Never Smoker   . Smokeless tobacco: Never Used  . Alcohol Use: No  . Drug Use: No  . Sexual Activity: Not Currently   Other Topics Concern  . Not on file   Social History Narrative    Family History  Problem Relation Age of Onset  . CVA Mother     BP 92/63 mmHg  Ht 5\' 6"  (1.676 m)  Wt 344 lb (156.037 kg)  BMI 55.55 kg/m2  Review of Systems: See HPI above.    Objective:  Physical Exam:  Gen: NAD Exam not repeated today.  Right knee: No gross deformity, ecchymoses, swelling. TTP medial joint line, post patellar facets. FROM. Negative ant/post drawers. Negative valgus/varus testing. Negative lachmanns. Negative mcmurrays, apleys, patellar apprehension. NV intact distally.    Assessment & Plan:  1. Right knee osteoarthritis - third supartz injection given today.  Will follow-up in 4 weeks.  After informed written consent, patient was seated on exam table. Right knee was prepped with alcohol swab and utilizing anteromedial approach, patient's right knee was injected intraarticularly with 36ml marcaine followed by Jacklyn Shell. Patient tolerated the procedure well without immediate complications.

## 2015-03-20 ENCOUNTER — Ambulatory Visit (HOSPITAL_COMMUNITY)
Admission: RE | Admit: 2015-03-20 | Discharge: 2015-03-20 | Disposition: A | Discharge status: Home / Self Care | Payer: Medicare Other | Source: Ambulatory Visit | Attending: Cardiology | Admitting: Cardiology

## 2015-03-20 DIAGNOSIS — I5022 Chronic systolic (congestive) heart failure: Secondary | ICD-10-CM | POA: Diagnosis not present

## 2015-03-20 LAB — BASIC METABOLIC PANEL
Anion gap: 8 (ref 5–15)
BUN: 19 mg/dL (ref 6–20)
CO2: 28 mmol/L (ref 22–32)
Calcium: 10 mg/dL (ref 8.9–10.3)
Chloride: 105 mmol/L (ref 101–111)
Creatinine, Ser: 1.34 mg/dL — ABNORMAL HIGH (ref 0.44–1.00)
GFR calc Af Amer: 50 mL/min — ABNORMAL LOW (ref >60)
GFR calc non Af Amer: 43 mL/min — ABNORMAL LOW (ref >60)
Glucose, Bld: 103 mg/dL — ABNORMAL HIGH (ref 65–99)
Potassium: 5 mmol/L (ref 3.5–5.1)
Sodium: 141 mmol/L (ref 135–145)

## 2015-03-29 ENCOUNTER — Encounter: Payer: Self-pay | Admitting: Family Medicine

## 2015-03-29 ENCOUNTER — Ambulatory Visit (INDEPENDENT_AMBULATORY_CARE_PROVIDER_SITE_OTHER): Payer: Medicare Other | Admitting: Family Medicine

## 2015-03-29 ENCOUNTER — Encounter (INDEPENDENT_AMBULATORY_CARE_PROVIDER_SITE_OTHER): Payer: Self-pay

## 2015-03-29 VITALS — BP 95/62 | HR 76 | Ht 66.0 in | Wt 342.0 lb

## 2015-03-29 DIAGNOSIS — M129 Arthropathy, unspecified: Secondary | ICD-10-CM

## 2015-03-29 DIAGNOSIS — M1711 Unilateral primary osteoarthritis, right knee: Secondary | ICD-10-CM

## 2015-03-29 NOTE — Patient Instructions (Signed)
There are a few things to consider at this point: 1. A different anti-inflammatory (like celebrex, voltaren, meloxicam). 2. Physical therapy 3. 4th and 5th gel shots (these haven't helped you however and usually 3 are enough to know if they will) 4. Orthopedic surgeon referral to discuss knee replacement

## 2015-04-02 NOTE — Assessment & Plan Note (Signed)
s/p cortisone injections, supartz, home exercise program, medications without much benefit unfortunately.  We discsused options: different NSAID, physical therapy, 4th and 5th supartz injections, ortho referral to discuss knee replacement.  She will consider these options and let us know how she would like to proceed.

## 2015-04-02 NOTE — Progress Notes (Signed)
PCP: Omer Jack, MD  Subjective:   HPI: Patient is a 59 y.o. female here for right knee pain.  8/29: Patient reports having about 2 months of right knee pain. Worsened to a 10/10 level now. Remote injury in 2000 - had this knee scoped and did well since then. History of left knee replacement. + swelling right knee. No catching, locking, giving out.  9/27: Patient reports she continues to struggle with 8/10 level of pain. Cortisone injection only helped for 1-2 days. Some swelling. Worse pain at nighttime. No catching, locking, giving out.  9/29: Patient returns to start supartz series.  10/6: Patient here for second supartz injection. She states she's had some improvement already. No pain currently.  10/13: Patient returns for third injection of supartz. No pain currently.  11/10: Patient reports she's still having 7/10 level of pain in right knee. Locked up this morning. Pain is sharp, constant medial and anterior knee. Worse with prolonged sitting, walking. Not much change with viscosupplementation. No skin changes, fever, other complaints.  Past Medical History  Diagnosis Date  . Aortic stenosis   . Complication of anesthesia   . PONV (postoperative nausea and vomiting)   . Atrial fibrillation ([place])   . NICM (nonischemic cardiomyopathy) ([place])     EF 30%  . Arthritis     KNEES  . Morbid obesity ([place])   . HLD (hyperlipidemia)   . Diabetes ([place])   . 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)     Current Outpatient Prescriptions on File Prior to Visit  Medication Sig Dispense Refill  . amiodarone (PACERONE) 200 MG tablet Take 1 tablet (200 mg total) by mouth daily. 90 tablet 3  . Cholecalciferol (VITAMIN D-3) 5000 UNITS TABS Take 5,000 Units by mouth daily.    . CRESTOR 10 MG tablet Take 10 mg by mouth at bedtime.  2  . docusate sodium (COLACE) 100 MG capsule Take 100 mg by mouth at bedtime.     [name] ELIQUIS 5 MG TABS tablet TAKE 1 TABLET BY MOUTH TWICE A DAY 60  tablet 5  . Ferrous Sulfate (PX IRON) 27 MG TABS Take 1 tablet by mouth daily.    . furosemide (LASIX) 40 MG tablet Take 1 tablet (40 mg total) by mouth every other day. 15 tablet 6  . HYDROcodone-acetaminophen (NORCO/VICODIN) 5-325 MG per tablet Take 2 tablets by mouth every 4 (four) hours as needed. 16 tablet 0  . lisinopril (PRINIVIL,ZESTRIL) 10 MG tablet Take 1 tablet (10 mg total) by mouth 2 (two) times daily. 60 tablet 6  . metoprolol succinate (TOPROL-XL) 50 MG 24 hr tablet TAKE 1 TABLET (50 MG TOTAL) BY MOUTH 2 (TWO) TIMES DAILY. TAKE WITH OR IMMEDIATELY FOLLOWING A MEAL. 180 tablet 3  . nitroGLYCERIN (NITROSTAT) 0.4 MG SL tablet Place 0.4 mg under the tongue every 5 (five) minutes as needed for chest pain.    . Omega-3 Fatty Acids (FISH OIL) 600 MG CAPS Take 600 Units by mouth 2 (two) times daily.    [name] spironolactone (ALDACTONE) 25 MG tablet Take 1 tablet (25 mg total) by mouth daily. 90 tablet 3   No current facility-administered medications on file prior to visit.    Past Surgical History  Procedure Laterality Date  . Joint replacement    . Tee without cardioversion N/A 07/29/2013    Procedure: TRANSESOPHAGEAL ECHOCARDIOGRAM (TEE);  Surgeon: [name], MD;  Location: [place];  Service: Cardiovascular;  Laterality: N/A;  . Cardioversion N/A 07/29/2013    Procedure:  CARDIOVERSION;  Surgeon: [name], MD;  Location: [place];  Service: Cardiovascular;  Laterality: N/A;  . Tee without cardioversion N/A 11/11/2013    Procedure: TRANSESOPHAGEAL ECHOCARDIOGRAM (TEE);  Surgeon: [name], MD;  Location: [place];  Service: Cardiovascular;  Laterality: N/A;  . Cardioversion N/A 11/11/2013    Procedure: CARDIOVERSION;  Surgeon: [name], MD;  Location: [place];  Service: Cardiovascular;  Laterality: N/A;  . Cardioversion N/A 12/08/2013    Procedure: CARDIOVERSION;  Surgeon: [name], MD;  Location: [place];  Service: Cardiovascular;   Laterality: N/A;  . Left heart catheterization with coronary angiogram N/A 08/01/2013    Procedure: LEFT HEART CATHETERIZATION WITH CORONARY ANGIOGRAM;  Surgeon: Blane Ohara, MD;  Location[facility] CATH LAB;  Service: Cardiovascular;  Laterality: N/A;    No Known Allergies  Social History   Social History  . Marital Status: Single    Spouse Name: N/A  . Number of Children: N/A  . Years of Education: N/A   Occupational History  . Not on file.   Social History Main Topics  . Smoking status: Never Smoker   . Smokeless tobacco: Never Used  . Alcohol Use: No  . Drug Use: No  . Sexual Activity: Not Currently   Other Topics Concern  . Not on file   Social History Narrative    Family History  Problem Relation Age of Onset  . CVA Mother     BP 95/62 mmHg  Pulse 76  Ht 5\' 6"  (1.676 m)  Wt 342 lb (155.13 kg)  BMI 55.23 kg/m2  Review of Systems: See HPI above.    Objective:  Physical Exam:  Gen: NAD, comfortable in exam room.  Right knee: No gross deformity, ecchymoses, swelling. TTP medial joint line, post patellar facets. FROM. Negative ant/post drawers. Negative valgus/varus testing. Negative lachmanns. Negative mcmurrays, apleys, patellar apprehension. NV intact distally.  Left knee: FROM without pain.    Assessment & Plan:  1. Right knee osteoarthritis - s/p cortisone injections, supartz, home exercise program, medications without much benefit unfortunately.  We discsused options: different NSAID, physical therapy, 4th and 5th supartz injections, ortho referral to discuss knee replacement.  She will consider these options and let us know how she would like to proceed.

## 2015-04-09 ENCOUNTER — Other Ambulatory Visit (HOSPITAL_COMMUNITY): Payer: Self-pay | Admitting: Internal Medicine

## 2015-04-18 ENCOUNTER — Ambulatory Visit (INDEPENDENT_AMBULATORY_CARE_PROVIDER_SITE_OTHER): Payer: Medicare Other | Admitting: Family Medicine

## 2015-04-18 ENCOUNTER — Encounter: Payer: Self-pay | Admitting: Family Medicine

## 2015-04-18 VITALS — BP 142/75 | HR 64 | Ht 66.0 in | Wt 342.0 lb

## 2015-04-18 DIAGNOSIS — M25572 Pain in left ankle and joints of left foot: Secondary | ICD-10-CM | POA: Diagnosis present

## 2015-04-18 NOTE — Patient Instructions (Signed)
Your ankle pain is due to arthritis. These are the 4 classes of medicine you can use for this: Tylenol 500mg  1-2 tabs three times a day for pain. Aleve 1-2 tabs twice a day with food Glucosamine sulfate 750mg  twice a day is a supplement that may help. Capsaicin, aspercreme, or biofreeze topically up to four times a day may also help with pain. Cortisone injections are an option. Shoe inserts with good arch support may be helpful. Walker or cane if needed. Heat or ice 15 minutes at a time 3-4 times a day as needed to help with pain. Cam walker (or laceup brace) for stability for next month at least. Follow up with me in 1 month.

## 2015-04-19 ENCOUNTER — Other Ambulatory Visit (HOSPITAL_COMMUNITY): Payer: Self-pay | Admitting: *Deleted

## 2015-04-19 DIAGNOSIS — I4891 Unspecified atrial fibrillation: Secondary | ICD-10-CM

## 2015-04-19 MED ORDER — LISINOPRIL 10 MG PO TABS: 10.0000 mg | ORAL_TABLET | Freq: Two times a day (BID) | ORAL | Status: DC

## 2015-04-19 MED ORDER — SPIRONOLACTONE 25 MG PO TABS: 25.0000 mg | ORAL_TABLET | Freq: Every day | ORAL | Status: DC

## 2015-04-19 MED ORDER — FUROSEMIDE 40 MG PO TABS: 40.0000 mg | ORAL_TABLET | ORAL | Status: DC

## 2015-04-20 DIAGNOSIS — M25572 Pain in left ankle and joints of left foot: Secondary | ICD-10-CM | POA: Insufficient documentation

## 2015-04-20 NOTE — Progress Notes (Signed)
PCP: Omer Jack, MD  Subjective:   HPI: Patient is a 59 y.o. female here for left ankle pain.  Patient states over past few months she's had worsening anterior left ankle pain. Worse with walking and prolonged standing. Pain level 9/10, sharp. No prior issues. No pain currently on right ankle. + swelling. No skin changes, fever, other complaints. Not doing anything for this currently.  Past Medical History  Diagnosis Date  . Aortic stenosis   . Complication of anesthesia   . PONV (postoperative nausea and vomiting)   . Atrial fibrillation ([place])   . NICM (nonischemic cardiomyopathy) ([place])     EF 30%  . Arthritis     KNEES  . Morbid obesity ([place])   . HLD (hyperlipidemia)   . Diabetes ([place])   . 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 amiodarone (PACERONE) 200 MG tablet Take 1 tablet (200 mg total) by mouth daily. 90 tablet 3  . Cholecalciferol (VITAMIN D-3) 5000 UNITS TABS Take 5,000 Units by mouth daily.    . CRESTOR 10 MG tablet Take 10 mg by mouth at bedtime.  2  . docusate sodium (COLACE) 100 MG capsule Take 100 mg by mouth at bedtime.     [name] ELIQUIS 5 MG TABS tablet TAKE 1 TABLET BY MOUTH TWICE A DAY 60 tablet 5  . Ferrous Sulfate (PX IRON) 27 MG TABS Take 1 tablet by mouth daily.    [name] HYDROcodone-acetaminophen (NORCO/VICODIN) 5-325 MG per tablet Take 2 tablets by mouth every 4 (four) hours as needed. 16 tablet 0  . metoprolol succinate (TOPROL-XL) 50 MG 24 hr tablet TAKE 1 TABLET (50 MG TOTAL) BY MOUTH 2 (TWO) TIMES DAILY. TAKE WITH OR IMMEDIATELY FOLLOWING A MEAL. 180 tablet 3  . nitroGLYCERIN (NITROSTAT) 0.4 MG SL tablet Place 0.4 mg under the tongue every 5 (five) minutes as needed for chest pain.    . Omega-3 Fatty Acids (FISH OIL) 600 MG CAPS Take 600 Units by mouth 2 (two) times daily.     No current facility-administered medications on file prior to visit.    Past Surgical  History  Procedure Laterality Date  . Joint replacement    . Tee without cardioversion N/A 07/29/2013    Procedure: TRANSESOPHAGEAL ECHOCARDIOGRAM (TEE);  Surgeon: [name], MD;  Location: [place];  Service: Cardiovascular;  Laterality: N/A;  . Cardioversion N/A 07/29/2013    Procedure: CARDIOVERSION;  Surgeon: [name], MD;  Location: [facility] ENDOSCOPY;  Service: Cardiovascular;  Laterality: N/A;  . Tee without cardioversion N/A 11/11/2013    Procedure: TRANSESOPHAGEAL ECHOCARDIOGRAM (TEE);  Surgeon: [name], MD;  Location: [place];  Service: Cardiovascular;  Laterality: N/A;  . Cardioversion N/A 11/11/2013    Procedure: CARDIOVERSION;  Surgeon: [name], MD;  Location: [place];  Service: Cardiovascular;  Laterality: N/A;  . Cardioversion N/A 12/08/2013    Procedure: CARDIOVERSION;  Surgeon: [name], MD;  Location: [place];  Service: Cardiovascular;  Laterality: N/A;  . Left heart catheterization with coronary angiogram N/A 08/01/2013    Procedure: LEFT HEART CATHETERIZATION WITH CORONARY ANGIOGRAM;  Surgeon: Blane Ohara, MD;  Location: [facility] CATH LAB;  Service: Cardiovascular;  Laterality: N/A;    No Known Allergies  Social History   Social History  . Marital Status: Single    Spouse Name: N/A  . Number of Children: N/A  . Years of Education: N/A  Occupational History  . Not on file.   Social History Main Topics  . Smoking status: Never Smoker   . Smokeless tobacco: Never Used  . Alcohol Use: No  . Drug Use: No  . Sexual Activity: Not Currently   Other Topics Concern  . Not on file   Social History Narrative    Family History  Problem Relation Age of Onset  . CVA Mother     BP 142/75 mmHg  Pulse 64  Ht 5\' 6"  (1.676 m)  Wt 342 lb (155.13 kg)  BMI 55.23 kg/m2  Review of Systems: See HPI above.    Objective:  Physical Exam:  Gen: NAD  Left ankle: Mild soft tissue swelling diffusely.  No other  deformity. FROM TTP anterior ankle joint. Negative ant drawer and talar tilt.   Negative syndesmotic compression. Thompsons test negative. NV intact distally.  Right ankle: FROM without pain.    Assessment & Plan:  1. Left ankle pain - consistent with DJD.  Discussed options - tylenol, nsaids, glucosamine, topical medications.  Arch supports.  She will try cam walker before consider cortisone injection.  F/u in 1 month.

## 2015-04-20 NOTE — Assessment & Plan Note (Signed)
consistent with DJD.  Discussed options - tylenol, nsaids, glucosamine, topical medications.  Arch supports.  She will try cam walker before consider cortisone injection.  F/u in 1 month.

## 2015-05-09 ENCOUNTER — Other Ambulatory Visit: Payer: Self-pay | Admitting: Internal Medicine

## 2015-05-17 ENCOUNTER — Ambulatory Visit: Payer: Medicare Other | Admitting: Family Medicine

## 2015-05-17 ENCOUNTER — Telehealth: Payer: Self-pay | Admitting: Family Medicine

## 2015-05-18 NOTE — Telephone Encounter (Signed)
If it's cortisone then it will be covered.  If it's the gel shots (supartz) we would have to get approval first.

## 2015-05-24 ENCOUNTER — Ambulatory Visit (INDEPENDENT_AMBULATORY_CARE_PROVIDER_SITE_OTHER): Payer: Medicare Other | Admitting: Family Medicine

## 2015-05-24 ENCOUNTER — Encounter: Payer: Self-pay | Admitting: Family Medicine

## 2015-05-24 VITALS — BP 95/66 | HR 64 | Ht 66.0 in | Wt 337.0 lb

## 2015-05-24 DIAGNOSIS — M25572 Pain in left ankle and joints of left foot: Secondary | ICD-10-CM | POA: Diagnosis not present

## 2015-05-24 MED ORDER — METHYLPREDNISOLONE ACETATE 40 MG/ML IJ SUSP
40.0000 mg | Freq: Once | INTRAMUSCULAR | Status: AC
Start: 2015-05-24 — End: 2015-05-24
  Administered 2015-05-24: 40 mg via INTRA_ARTICULAR

## 2015-05-26 ENCOUNTER — Other Ambulatory Visit (HOSPITAL_COMMUNITY): Payer: Self-pay | Admitting: Internal Medicine

## 2015-05-28 NOTE — Progress Notes (Signed)
PCP: Omer Jack, MD  Subjective:   HPI: Patient is a 60 y.o. female here for left ankle pain.  04/18/15: Patient states over past few months she's had worsening anterior left ankle pain. Worse with walking and prolonged standing. Pain level 9/10, sharp. No prior issues. No pain currently on right ankle. + swelling. No skin changes, fever, other complaints. Not doing anything for this currently.  05/24/15: Patient returns with 10/10 level of pain anterior left ankle. States she is unable to wear the boot - feels more unstable and fell because of it. Using an ankle wrap. No skin changes, fever, other complaints.  Past Medical History  Diagnosis Date  . Aortic stenosis   . Complication of anesthesia   . PONV (postoperative nausea and vomiting)   . Atrial fibrillation ([place])   . NICM (nonischemic cardiomyopathy) ([place])     EF 30%  . Arthritis     KNEES  . Morbid obesity ([place])   . HLD (hyperlipidemia)   . Diabetes ([place])   . 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 amiodarone (PACERONE) 200 MG tablet Take 1 tablet (200 mg total) by mouth daily. 90 tablet 3  . Cholecalciferol (VITAMIN D-3) 5000 UNITS TABS Take 5,000 Units by mouth daily.    . CRESTOR 10 MG tablet Take 10 mg by mouth at bedtime.  2  . docusate sodium (COLACE) 100 MG capsule Take 100 mg by mouth at bedtime.     [name] ELIQUIS 5 MG TABS tablet TAKE 1 TABLET BY MOUTH TWICE A DAY 60 tablet 5  . ELIQUIS 5 MG TABS tablet TAKE 1 TABLET BY MOUTH TWICE A DAY 60 tablet 5  . Ferrous Sulfate (PX IRON) 27 MG TABS Take 1 tablet by mouth daily.    . furosemide (LASIX) 40 MG tablet Take 1 tablet (40 mg total) by mouth every other day. 15 tablet 6  . HYDROcodone-acetaminophen (NORCO/VICODIN) 5-325 MG per tablet Take 2 tablets by mouth every 4 (four) hours as needed. 16 tablet 0  . lisinopril (PRINIVIL,ZESTRIL) 10 MG tablet Take 1 tablet (10 mg  total) by mouth 2 (two) times daily. 60 tablet 6  . metoprolol succinate (TOPROL-XL) 50 MG 24 hr tablet TAKE 1 TABLET (50 MG TOTAL) BY MOUTH 2 (TWO) TIMES DAILY. TAKE WITH OR IMMEDIATELY FOLLOWING A MEAL. 180 tablet 3  . nitroGLYCERIN (NITROSTAT) 0.4 MG SL tablet Place 0.4 mg under the tongue every 5 (five) minutes as needed for chest pain.    . Omega-3 Fatty Acids (FISH OIL) 600 MG CAPS Take 600 Units by mouth 2 (two) times daily.    [name] spironolactone (ALDACTONE) 25 MG tablet Take 1 tablet (25 mg total) by mouth daily. 90 tablet 3   No current facility-administered medications on file prior to visit.    Past Surgical History  Procedure Laterality Date  . Joint replacement    . Tee without cardioversion N/A 07/29/2013    Procedure: TRANSESOPHAGEAL ECHOCARDIOGRAM (TEE);  Surgeon: [name], MD;  Location: [place];  Service: Cardiovascular;  Laterality: N/A;  . Cardioversion N/A 07/29/2013    Procedure: CARDIOVERSION;  Surgeon: [name], MD;  Location: [facility] ENDOSCOPY;  Service: Cardiovascular;  Laterality: N/A;  . Tee without cardioversion N/A 11/11/2013    Procedure: TRANSESOPHAGEAL ECHOCARDIOGRAM (TEE);  Surgeon: [name], MD;  Location: [place];  Service: Cardiovascular;  Laterality: N/A;  . Cardioversion N/A 11/11/2013  Procedure: CARDIOVERSION;  Surgeon: [name], MD;  Location: [place];  Service: Cardiovascular;  Laterality: N/A;  . Cardioversion N/A 12/08/2013    Procedure: CARDIOVERSION;  Surgeon: [name], MD;  Location: [place];  Service: Cardiovascular;  Laterality: N/A;  . Left heart catheterization with coronary angiogram N/A 08/01/2013    Procedure: LEFT HEART CATHETERIZATION WITH CORONARY ANGIOGRAM;  Surgeon: Blane Ohara, MD;  Location: [facility] CATH LAB;  Service: Cardiovascular;  Laterality: N/A;    No Known Allergies  Social History   Social History  . Marital Status: Single    Spouse Name: N/A  . Number of Children:  N/A  . Years of Education: N/A   Occupational History  . Not on file.   Social History Main Topics  . Smoking status: Never Smoker   . Smokeless tobacco: Never Used  . Alcohol Use: No  . Drug Use: No  . Sexual Activity: Not Currently   Other Topics Concern  . Not on file   Social History Narrative    Family History  Problem Relation Age of Onset  . CVA Mother     BP 95/66 mmHg  Pulse 64  Ht 5\' 6"  (1.676 m)  Wt 337 lb (152.862 kg)  BMI 54.42 kg/m2  Review of Systems: See HPI above.    Objective:  Physical Exam:  Gen: NAD, comfortable in exam room.  Left ankle: Mild soft tissue swelling diffusely.  No other deformity. FROM TTP anterior ankle joint. Negative ant drawer and talar tilt.   Negative syndesmotic compression. Thompsons test negative. NV intact distally.  Right ankle: FROM without pain.    Assessment & Plan:  1. Left ankle pain - consistent with DJD.  Not improving with conservative treatment to date - OTC medicines, trial of boot.  Injection given today.  F/u in 5-6 weeks for reevaluation.  After informed written consent patient was seated on exam table.  Anterolateral aspect of left ankle joint identified with ultrasound, prepped with alcohol swab and injected with 2:1 marcaine: depomedrol.  Patient tolerated procedure well without immediate complications.

## 2015-05-28 NOTE — Assessment & Plan Note (Signed)
consistent with DJD.  Not improving with conservative treatment to date - OTC medicines, trial of boot.  Injection given today.  F/u in 5-6 weeks for reevaluation.  After informed written consent patient was seated on exam table.  Anterolateral aspect of left ankle joint identified with ultrasound, prepped with alcohol swab and injected with 2:1 marcaine: depomedrol.  Patient tolerated procedure well without immediate complications.

## 2015-11-04 ENCOUNTER — Other Ambulatory Visit: Payer: Self-pay | Admitting: Internal Medicine

## 2016-03-01 ENCOUNTER — Other Ambulatory Visit (HOSPITAL_COMMUNITY): Payer: Self-pay | Admitting: Adult Health

## 2016-03-28 ENCOUNTER — Telehealth (HOSPITAL_COMMUNITY): Payer: Self-pay | Admitting: *Deleted

## 2016-03-28 NOTE — Telephone Encounter (Signed)
Received referral via fax for afib from [facility]. Called and left message for pt to call back and schedule appt.

## 2016-03-31 NOTE — Telephone Encounter (Signed)
Pt sched appt for 04/07/16

## 2016-04-04 ENCOUNTER — Other Ambulatory Visit (HOSPITAL_COMMUNITY): Payer: Self-pay | Admitting: Internal Medicine

## 2016-04-07 ENCOUNTER — Encounter (HOSPITAL_COMMUNITY): Payer: Self-pay | Admitting: Nurse Practitioner

## 2016-04-07 ENCOUNTER — Ambulatory Visit (HOSPITAL_COMMUNITY)
Admission: RE | Admit: 2016-04-07 | Discharge: 2016-04-07 | Disposition: A | Discharge status: Home / Self Care | Payer: Medicare Other | Source: Ambulatory Visit | Attending: Nurse Practitioner | Admitting: Nurse Practitioner

## 2016-04-07 VITALS — BP 126/78 | HR 65 | Ht 66.0 in | Wt 359.6 lb

## 2016-04-07 DIAGNOSIS — Z6841 Body Mass Index (BMI) 40.0 and over, adult: Secondary | ICD-10-CM | POA: Insufficient documentation

## 2016-04-07 DIAGNOSIS — Z823 Family history of stroke: Secondary | ICD-10-CM | POA: Insufficient documentation

## 2016-04-07 DIAGNOSIS — E119 Type 2 diabetes mellitus without complications: Secondary | ICD-10-CM | POA: Insufficient documentation

## 2016-04-07 DIAGNOSIS — Z79899 Other long term (current) drug therapy: Secondary | ICD-10-CM | POA: Diagnosis not present

## 2016-04-07 DIAGNOSIS — E785 Hyperlipidemia, unspecified: Secondary | ICD-10-CM | POA: Diagnosis not present

## 2016-04-07 DIAGNOSIS — I428 Other cardiomyopathies: Secondary | ICD-10-CM | POA: Diagnosis not present

## 2016-04-07 DIAGNOSIS — Z7901 Long term (current) use of anticoagulants: Secondary | ICD-10-CM | POA: Insufficient documentation

## 2016-04-07 DIAGNOSIS — N289 Disorder of kidney and ureter, unspecified: Secondary | ICD-10-CM | POA: Diagnosis not present

## 2016-04-07 DIAGNOSIS — I481 Persistent atrial fibrillation: Secondary | ICD-10-CM | POA: Diagnosis not present

## 2016-04-07 DIAGNOSIS — I480 Paroxysmal atrial fibrillation: Secondary | ICD-10-CM | POA: Diagnosis not present

## 2016-04-07 NOTE — Progress Notes (Signed)
Primary Care Physician: Omer Jack, MD Referring Physician:  Dr Mila Homer is a 60 y.o. female with a h/o persistent atrial fibrillation, obesity, and nonischemic cardiomyopathy presents for electrophysiology evaluation .[name] Patient was admitted in 3/15 at [facility] with atrial fibrillation and CHF. TEE was done in preparation for DCCV, showing EF 25-30% but there was LA thrombus so DCCV was not done. Plan was for anticoagulation x 3 months followed by TEE-guided DCCV. LHC was also done, showing mild nonobstructive CAD. Patient was diuresed with IV Lasix in the hospital and started on apixaban. Chadsvasc score of at least 3.  Patient had TEE again in 6/15, showing EF 35-40% with diffuse hypokinesis and no LAA thrombus. She was cardioverted to NSR (with difficulty). She was started  on amiodarone, however she returned to atrial fibrillation. After she had been loaded on amiodarone, she was cardioverted  again in 7/15. She went back into NSR easily  but returned back to afib in a matter of days. She cannot tell when she went back into atrial fibrillation, she is asymptomatic in it.. Weight is down per patient about 30 pounds since March of this year. She is able to walk on flat ground without dyspnea.Denies smoking history, alcohol use and snoring.  Clinic visit today reveals pt in [place]. She is feeling will without any issues with irregular rhythm. Has noticed much less dyspnea. She continues on amiodarone 200 mg qd.   [facility] is suppose to [facility] 31st. She will talk to the sponsoring person who set up the class to see if it can be continued. Her weight id down 3 lbs.  F/u in afib clinic 11/20. Pt was referred from her PCP after being seen for a physical and noted an irregular heart beat. Pt today in SR and did not feel that she was in afib that day. Usually will feel more short winded and  fatigued. She states that since she has been on amiodarone she has not noticed any irregular heart beat. She was seen by Dr. Rayann Heman in the past and was felt that she was not an ablation candidate due to her weight and was advised that if she could get below 300 lbs, she may be a candidate. She continues to struggle with her weight. Tsh and liver panel both just checked by PCP and within normal limits  Today, she denies symptoms of palpitations, chest pain, shortness of breath, orthopnea, PND, lower extremity edema, dizziness, presyncope, syncope, or neurologic sequela. The patient is tolerating medications without difficulties and is otherwise without complaint today.    Past Medical History:  Diagnosis Date  . Aortic stenosis   . Arthritis    KNEES  . Atrial fibrillation ([place])   . Complication of anesthesia   . Diabetes ([place])   . HLD (hyperlipidemia)   . Left atrial thrombus   . Morbid obesity ([place])   . NICM (nonischemic cardiomyopathy) ([place])    EF 30%  . PONV (postoperative nausea and vomiting)    Past Surgical History:  Procedure Laterality Date  . CARDIOVERSION N/A 07/29/2013   Procedure: CARDIOVERSION;  Surgeon: [name], MD;  Location: [facility] ENDOSCOPY;  Service: Cardiovascular;  Laterality: N/A;  . CARDIOVERSION N/A 11/11/2013   Procedure: CARDIOVERSION;  Surgeon: [name], MD;  Location: [place];  Service: Cardiovascular;  Laterality: N/A;  . CARDIOVERSION N/A 12/08/2013  Procedure: CARDIOVERSION;  Surgeon: [name], MD;  Location: [place];  Service: Cardiovascular;  Laterality: N/A;  . JOINT REPLACEMENT    . LEFT HEART CATHETERIZATION WITH CORONARY ANGIOGRAM N/A 08/01/2013   Procedure: LEFT HEART CATHETERIZATION WITH CORONARY ANGIOGRAM;  Surgeon: Blane Ohara, MD;  Location: [facility] CATH LAB;  Service: Cardiovascular;  Laterality: N/A;  . TEE WITHOUT CARDIOVERSION N/A 07/29/2013   Procedure: TRANSESOPHAGEAL ECHOCARDIOGRAM (TEE);  Surgeon: [name], MD;  Location: [place];  Service: Cardiovascular;  Laterality: N/A;  . TEE WITHOUT CARDIOVERSION N/A 11/11/2013   Procedure: TRANSESOPHAGEAL ECHOCARDIOGRAM (TEE);  Surgeon: [name], MD;  Location: [facility] ENDOSCOPY;  Service: Cardiovascular;  Laterality: N/A;    Current Outpatient Prescriptions  Medication Sig Dispense Refill  . amiodarone (PACERONE) 200 MG tablet TAKE 1 TABLET (200 MG TOTAL) BY MOUTH 2 (TWO) TIMES DAILY. 180 tablet 3  . Cholecalciferol (VITAMIN D-3) 5000 UNITS TABS Take 5,000 Units by mouth daily.    . CRESTOR 10 MG tablet Take 10 mg by mouth at bedtime.  2  . docusate sodium (COLACE) 100 MG capsule Take 100 mg by mouth at bedtime.     [name] ELIQUIS 5 MG TABS tablet TAKE 1 TABLET BY MOUTH TWICE A DAY 60 tablet 5  . Ferrous Sulfate (PX IRON) 27 MG TABS Take 1 tablet by mouth daily.    . furosemide (LASIX) 40 MG tablet Take 1 tablet (40 mg total) by mouth every other day. 15 tablet 6  . HYDROcodone-acetaminophen (NORCO/VICODIN) 5-325 MG per tablet Take 2 tablets by mouth every 4 (four) hours as needed. 16 tablet 0  . lisinopril (PRINIVIL,ZESTRIL) 10 MG tablet TAKE 1 TABLET (10 MG TOTAL) BY MOUTH 2 (TWO) TIMES DAILY. 60 tablet 6  . metoprolol succinate (TOPROL-XL) 50 MG 24 hr tablet TAKE 1 TABLET (50 MG TOTAL) BY MOUTH 2 (TWO) TIMES DAILY. TAKE WITH OR IMMEDIATELY FOLLOWING A MEAL. 180 tablet 3  . nitroGLYCERIN (NITROSTAT) 0.4 MG SL tablet Place 0.4 mg under the tongue every 5 (five) minutes as needed for chest pain.    . Omega-3 Fatty Acids (FISH OIL) 600 MG CAPS Take 600 Units by mouth 2 (two) times daily.    [name] spironolactone (ALDACTONE) 25 MG tablet Take 1 tablet (25 mg total) by mouth daily. 90 tablet 3   No current facility-administered medications for this encounter.     No Known Allergies  Social History   Social History  . Marital status: Single    Spouse name: N/A  . Number of children: N/A  . Years of education: N/A   Occupational History  . Not  on file.   Social History Main Topics  . Smoking status: Never Smoker  . Smokeless tobacco: Never Used  . Alcohol use No  . Drug use: No  . Sexual activity: Not Currently   Other Topics Concern  . Not on file   Social History Narrative  . No narrative on file    Family History  Problem Relation Age of Onset  . CVA Mother     ROS- Systems negative except as per the HPI above  Physical Exam: BP with thigh cuff 120/78. Vitals:   04/07/16 0921  BP: 126/78  BP Location: Left Arm  Patient Position: Sitting  Cuff Size: Large  Pulse: 65  Weight: (!) 359 lb 9.6 oz (163.1 kg)  Height: 5\' 6"  (1.676 m)    GEN- The patient is well appearing, alert and oriented x 3 today.  Head- normocephalic, atraumatic Eyes-  Sclera clear, conjunctiva pink Ears- hearing intact Oropharynx- clear Neck- supple, no JVP Lymph- no cervical lymphadenopathy Lungs- Clear to ausculation bilaterally, normal work of breathing Heart- Regular rate and rhythm, no murmurs, rubs or gallops, PMI not laterally displaced GI- soft, NT, ND, + BS Extremities- no clubbing, cyanosis, or edema MS- no significant deformity or atrophy Skin- no rash or lesion Psych- euthymic mood, full affect Neuro- strength and sensation are intact  EKG- Sinus rhythm at 65 bpm  Pr int 170 ms, qrs int 90 ms, qtc 420 ms  ECHO-TEE (3/15) with EF 25-30%, moderate MR, PA systolic pressure 51 mmHg, moderate to severe TR, LA thrombus was noted.  TEE (6/15) with EF 35-40%, diffuse hypokinesis, mild MR, moderate TR, +PFO, no LA appendage thrombus.    LHC- (3/15) with 50% stenosis small PLV.    Assessment and Plan: 1. Persistent Afib-  Appears to be maintaining  SR Continue  amiodarone 200 mg a day. Liver, thyroid recently checked at PCP and wnl.  2.NICM Stable Continue ace, bb, diuretic,spironolactone. Consideration for repeating echo on subsequent visits.  3. HTN Well managed today. No changes  4. Morbid obesity Encouraged  continued weight lose and regular exercise.  5.CHADS2VA2Sc  Continue xarelto to reduce stroke risk No current issues with bleeding.  6. Renal insufficiency Check kidney function today.  F/u afib clinic in 6 months

## 2016-04-08 ENCOUNTER — Encounter: Payer: Self-pay | Admitting: Family Medicine

## 2016-04-08 ENCOUNTER — Ambulatory Visit (INDEPENDENT_AMBULATORY_CARE_PROVIDER_SITE_OTHER): Payer: Medicare Other | Admitting: Family Medicine

## 2016-04-08 VITALS — BP 110/72 | HR 60 | Ht 66.0 in | Wt 358.0 lb

## 2016-04-08 DIAGNOSIS — G8929 Other chronic pain: Secondary | ICD-10-CM

## 2016-04-08 DIAGNOSIS — M25572 Pain in left ankle and joints of left foot: Principal | ICD-10-CM

## 2016-04-08 NOTE — Patient Instructions (Signed)
Your ankle pain is due to arthritis. These are the 4 classes of medicine you can use for this: Tylenol 500mg  1-2 tabs three times a day for pain. Aleve 1-2 tabs twice a day with food if you can take ant-inflammatories. Glucosamine sulfate 750mg  twice a day is a supplement that may help. Capsaicin, aspercreme, or biofreeze topically up to four times a day may also help with pain. Cortisone injections are an option - you were given this today. Shoe inserts with good arch support may be helpful. Walker or cane if needed. Heat or ice 15 minutes at a time 3-4 times a day as needed to help with pain. Follow up with me prn.

## 2016-04-09 DIAGNOSIS — M25572 Pain in left ankle and joints of left foot: Secondary | ICD-10-CM

## 2016-04-09 DIAGNOSIS — G8929 Other chronic pain: Secondary | ICD-10-CM

## 2016-04-09 MED ORDER — METHYLPREDNISOLONE ACETATE 40 MG/ML IJ SUSP
40.0000 mg | Freq: Once | INTRAMUSCULAR | Status: AC
  Administered 2016-04-09: 40 mg via INTRA_ARTICULAR

## 2016-04-13 NOTE — Assessment & Plan Note (Signed)
consistent with DJD.  Did well for several months following injection - repeated this today.  Discussed tylenol, aleve, glucosamine, topical medications.  Heat/ice if needed.  Consider imaging if not improving.  F/u prn.  After informed written consent patient was seated on exam table.  Anterolateral aspect of left ankle joint identified with ultrasound, prepped with alcohol swab and injected with 2:1 marcaine: depomedrol.  Patient tolerated procedure well without immediate complications.

## 2016-04-13 NOTE — Progress Notes (Signed)
PCP: Omer Jack, MD  Subjective:   HPI: Patient is a 60 y.o. female here for left ankle pain.  04/18/15: Patient states over past few months she's had worsening anterior left ankle pain. Worse with walking and prolonged standing. Pain level 9/10, sharp. No prior issues. No pain currently on right ankle. + swelling. No skin changes, fever, other complaints. Not doing anything for this currently.  05/24/15: Patient returns with 10/10 level of pain anterior left ankle. States she is unable to wear the boot - feels more unstable and fell because of it. Using an ankle wrap. No skin changes, fever, other complaints.  04/08/16: Patient reports her ankle pain started coming back about 2 months ago. Pain anterior left ankle, some swelling. Pain worse with walking and standing. Tried topical medication, biofreeze. Pain up to 9/10, sharp. No skin changes, numbness.  Past Medical History:  Diagnosis Date  . Aortic stenosis   . Arthritis    KNEES  . Atrial fibrillation ([place])   . Complication of anesthesia   . Diabetes ([place])   . HLD (hyperlipidemia)   . Left atrial thrombus   . Morbid obesity ([place])   . NICM (nonischemic cardiomyopathy) ([place])    EF 30%  . PONV (postoperative nausea and vomiting)     Current Outpatient Prescriptions on File Prior to Visit  Medication Sig Dispense Refill  . amiodarone (PACERONE) 200 MG tablet TAKE 1 TABLET (200 MG TOTAL) BY MOUTH 2 (TWO) TIMES DAILY. 180 tablet 3  . Cholecalciferol (VITAMIN D-3) 5000 UNITS TABS Take 5,000 Units by mouth daily.    [name] docusate sodium (COLACE) 100 MG capsule Take 100 mg by mouth at bedtime.     [name] ELIQUIS 5 MG TABS tablet TAKE 1 TABLET BY MOUTH TWICE A DAY 60 tablet 5  . Ferrous Sulfate (PX IRON) 27 MG TABS Take 1 tablet by mouth daily.    . furosemide (LASIX) 40 MG tablet Take 1 tablet (40 mg total) by mouth every other day. 15 tablet 6  . HYDROcodone-acetaminophen (NORCO/VICODIN) 5-325 MG per tablet Take 2  tablets by mouth every 4 (four) hours as needed. 16 tablet 0  . lisinopril (PRINIVIL,ZESTRIL) 10 MG tablet TAKE 1 TABLET (10 MG TOTAL) BY MOUTH 2 (TWO) TIMES DAILY. 60 tablet 6  . metoprolol succinate (TOPROL-XL) 50 MG 24 hr tablet TAKE 1 TABLET (50 MG TOTAL) BY MOUTH 2 (TWO) TIMES DAILY. TAKE WITH OR IMMEDIATELY FOLLOWING A MEAL. 180 tablet 3  . nitroGLYCERIN (NITROSTAT) 0.4 MG SL tablet Place 0.4 mg under the tongue every 5 (five) minutes as needed for chest pain.    . Omega-3 Fatty Acids (FISH OIL) 600 MG CAPS Take 600 Units by mouth 2 (two) times daily.    [name] spironolactone (ALDACTONE) 25 MG tablet Take 1 tablet (25 mg total) by mouth daily. 90 tablet 3   No current facility-administered medications on file prior to visit.     Past Surgical History:  Procedure Laterality Date  . CARDIOVERSION N/A 07/29/2013   Procedure: CARDIOVERSION;  Surgeon: [name], MD;  Location: [facility] ENDOSCOPY;  Service: Cardiovascular;  Laterality: N/A;  . CARDIOVERSION N/A 11/11/2013   Procedure: CARDIOVERSION;  Surgeon: [name], MD;  Location: [facility] ENDOSCOPY;  Service: Cardiovascular;  Laterality: N/A;  . CARDIOVERSION N/A 12/08/2013   Procedure: CARDIOVERSION;  Surgeon: [name], MD;  Location: [place];  Service: Cardiovascular;  Laterality: N/A;  . JOINT REPLACEMENT    . LEFT HEART CATHETERIZATION WITH CORONARY ANGIOGRAM N/A  08/01/2013   Procedure: LEFT HEART CATHETERIZATION WITH CORONARY ANGIOGRAM;  Surgeon: Blane Ohara, MD;  Location: [facility] CATH LAB;  Service: Cardiovascular;  Laterality: N/A;  . TEE WITHOUT CARDIOVERSION N/A 07/29/2013   Procedure: TRANSESOPHAGEAL ECHOCARDIOGRAM (TEE);  Surgeon: [name], MD;  Location: [place];  Service: Cardiovascular;  Laterality: N/A;  . TEE WITHOUT CARDIOVERSION N/A 11/11/2013   Procedure: TRANSESOPHAGEAL ECHOCARDIOGRAM (TEE);  Surgeon: [name], MD;  Location: [place];  Service: Cardiovascular;  Laterality: N/A;    No  Known Allergies  Social History   Social History  . Marital status: Single    Spouse name: N/A  . Number of children: N/A  . Years of education: N/A   Occupational History  . Not on file.   Social History Main Topics  . Smoking status: Never Smoker  . Smokeless tobacco: Never Used  . Alcohol use No  . Drug use: No  . Sexual activity: Not Currently   Other Topics Concern  . Not on file   Social History Narrative  . No narrative on file    Family History  Problem Relation Age of Onset  . CVA Mother     BP 110/72   Pulse 60   Ht 5\' 6"  (1.676 m)   Wt (!) 358 lb (162.4 kg)   BMI 57.78 kg/m   Review of Systems: See HPI above.    Objective:  Physical Exam:  Gen: NAD, comfortable in exam room.  Left ankle: Mild soft tissue swelling diffusely.  No other deformity. FROM TTP anterior ankle joint. Negative ant drawer and talar tilt.   Negative syndesmotic compression. Thompsons test negative. NV intact distally.  Right ankle: FROM without pain.    Assessment & Plan:  1. Left ankle pain - consistent with DJD.  Did well for several months following injection - repeated this today.  Discussed tylenol, aleve, glucosamine, topical medications.  Heat/ice if needed.  Consider imaging if not improving.  F/u prn.  After informed written consent patient was seated on exam table.  Anterolateral aspect of left ankle joint identified with ultrasound, prepped with alcohol swab and injected with 2:1 marcaine: depomedrol.  Patient tolerated procedure well without immediate complications.

## 2016-05-06 ENCOUNTER — Encounter (HOSPITAL_COMMUNITY): Payer: Medicare Other

## 2016-05-24 ENCOUNTER — Other Ambulatory Visit (HOSPITAL_COMMUNITY): Payer: Self-pay | Admitting: Internal Medicine

## 2016-06-02 ENCOUNTER — Other Ambulatory Visit (HOSPITAL_COMMUNITY): Payer: Self-pay | Admitting: Internal Medicine

## 2016-06-03 ENCOUNTER — Encounter (HOSPITAL_COMMUNITY): Payer: Self-pay

## 2016-06-03 ENCOUNTER — Ambulatory Visit (HOSPITAL_COMMUNITY)
Admission: RE | Admit: 2016-06-03 | Discharge: 2016-06-03 | Disposition: A | Discharge status: Home / Self Care | Payer: Medicare Other | Source: Ambulatory Visit | Attending: Cardiology | Admitting: Cardiology

## 2016-06-03 VITALS — BP 112/70 | HR 75 | Wt 351.0 lb

## 2016-06-03 DIAGNOSIS — I480 Paroxysmal atrial fibrillation: Secondary | ICD-10-CM | POA: Diagnosis not present

## 2016-06-03 DIAGNOSIS — E785 Hyperlipidemia, unspecified: Secondary | ICD-10-CM | POA: Insufficient documentation

## 2016-06-03 DIAGNOSIS — E669 Obesity, unspecified: Secondary | ICD-10-CM | POA: Diagnosis not present

## 2016-06-03 DIAGNOSIS — Z79899 Other long term (current) drug therapy: Secondary | ICD-10-CM | POA: Insufficient documentation

## 2016-06-03 DIAGNOSIS — I5022 Chronic systolic (congestive) heart failure: Secondary | ICD-10-CM | POA: Diagnosis not present

## 2016-06-03 DIAGNOSIS — Z7901 Long term (current) use of anticoagulants: Secondary | ICD-10-CM | POA: Diagnosis not present

## 2016-06-03 DIAGNOSIS — N63 Unspecified lump in unspecified breast: Secondary | ICD-10-CM | POA: Insufficient documentation

## 2016-06-03 DIAGNOSIS — Z6841 Body Mass Index (BMI) 40.0 and over, adult: Secondary | ICD-10-CM | POA: Diagnosis not present

## 2016-06-03 DIAGNOSIS — I2510 Atherosclerotic heart disease of native coronary artery without angina pectoris: Secondary | ICD-10-CM | POA: Insufficient documentation

## 2016-06-03 DIAGNOSIS — I428 Other cardiomyopathies: Secondary | ICD-10-CM | POA: Diagnosis not present

## 2016-06-03 LAB — COMPREHENSIVE METABOLIC PANEL
ALT: 13 U/L — ABNORMAL LOW (ref 14–54)
AST: 18 U/L (ref 15–41)
Albumin: 4.1 g/dL (ref 3.5–5.0)
Alkaline Phosphatase: 76 U/L (ref 38–126)
Anion gap: 7 (ref 5–15)
BUN: 15 mg/dL (ref 6–20)
CO2: 27 mmol/L (ref 22–32)
Calcium: 9.8 mg/dL (ref 8.9–10.3)
Chloride: 106 mmol/L (ref 101–111)
Creatinine, Ser: 1.16 mg/dL — ABNORMAL HIGH (ref 0.44–1.00)
GFR calc Af Amer: 59 mL/min — ABNORMAL LOW (ref >60)
GFR calc non Af Amer: 50 mL/min — ABNORMAL LOW (ref >60)
Glucose, Bld: 119 mg/dL — ABNORMAL HIGH (ref 65–99)
Potassium: 4.9 mmol/L (ref 3.5–5.1)
Sodium: 140 mmol/L (ref 135–145)
Total Bilirubin: 0.7 mg/dL (ref 0.3–1.2)
Total Protein: 7.5 g/dL (ref 6.5–8.1)

## 2016-06-03 LAB — CBC
HCT: 36.6 % (ref 36.0–46.0)
Hemoglobin: 11.8 g/dL — ABNORMAL LOW (ref 12.0–15.0)
MCH: 28.7 pg (ref 26.0–34.0)
MCHC: 32.2 g/dL (ref 30.0–36.0)
MCV: 89.1 fL (ref 78.0–100.0)
Platelets: 249 10*3/uL (ref 150–400)
RBC: 4.11 MIL/uL (ref 3.87–5.11)
RDW: 14 % (ref 11.5–15.5)
WBC: 4.7 10*3/uL (ref 4.0–10.5)

## 2016-06-03 LAB — TSH: TSH: 1.154 u[IU]/mL (ref 0.350–4.500)

## 2016-06-03 LAB — BRAIN NATRIURETIC PEPTIDE: B Natriuretic Peptide: 130.7 pg/mL — ABNORMAL HIGH (ref 0.0–100.0)

## 2016-06-03 MED ORDER — METOPROLOL SUCCINATE ER 50 MG PO TB24: ORAL_TABLET | ORAL | 3 refills | Status: DC

## 2016-06-03 NOTE — Patient Instructions (Addendum)
Labs today  INCREASE Metoprolol to 75mg  (1.5 tablets) by mouth twice daily.  TAKE Amiodarone 200mg   (1 tablet) twice daily.   Your physician has requested that you have an echocardiogram. Echocardiography is a painless test that uses sound waves to create images of your heart. It provides your doctor with information about the size and shape of your heart and how well your heart's chambers and valves are working. This procedure takes approximately one hour. There are no restrictions for this procedure.  Your physician recommends that you schedule a follow-up appointment in: 1 month

## 2016-06-04 NOTE — Progress Notes (Signed)
Patient ID: Kimberly [name], female   DOB: 11/23/1955, 61 y.o.   MRN: [id number] PCP: Dr. Gery Pray [facility]) Cardiology: Dr. Aundra Dubin  61 yo with history of paroxysmal atrial fibrillation, obesity, and nonischemic cardiomyopathy presents for cardiology followup.  Patient was admitted in 3/15 at [facility] with atrial fibrillation and CHF.  TEE was done in preparation for DCCV, showing EF 25-30% but there was LA thrombus so DCCV was not done.  Plan was for anticoagulation x 3 months followed by TEE-guided DCCV in the OR (due to patient's body habitus).  LHC was also done, showing mild nonobstructive CAD.  Patient was diuresed with IV Lasix in the hospital and started on apixaban.   Patient had TEE again in 6/15, showing EF 35-40% with diffuse hypokinesis and no LAA thrombus.  She was cardioverted to NSR (with difficulty).  I started her at that time on amiodarone.  She then went back into atrial fibrillation.  After she had been loaded on amiodarone, I cardioverted her again in 7/15.  She went back into NSR easily this time.  She has been back and forth between NSR and atrial fibrillation since that time.  She does not feel palpitations.  She is able to walk on flat ground without dyspnea.  She has dyspnea with stairs and inclines.  No lightheadedness, no chest pain, no orthopnea/PND.  Today, she is in atrial fibrillation.  No melena/BRBPR.   Of note, she has a breast surgery planned next month.  She has a mass and needs a lumpectomy.   ECG: atrial fibrillation at 116  Labs (4/15): HCT 35.3 Labs (5/15): K 3.9, creatinine 1.0 Labs (6/15): K 4.3, creatinine 1.0, BNP 217, LDL 106 Labs (7/15): K 4.5, creatinine 1.24, LFTs normal, TSH normal  PMH: 1. Obese 2. Atrial fibrillation: Paroxysmal.  Unable to cardiovert in 3/15 due to LAA thrombus noted on TEE. TEE in 6/15 without LAA thrombus.  Patient had cardioversion to NSR with difficulty but back in atrial fibrillation by 7/15 appt.  She was started on  amiodarone and cardioverted in 7/15, but was back in atrial fibrillation by 8/15 despite amiodarone use.  3. H/o TKR 4. Nonischemic cardiomyopathy: TEE (3/15) with EF 25-30%, moderate MR, PA systolic pressure 51 mmHg, moderate to severe TR, LA thrombus was noted.   LHC (3/15) with 50% stenosis small PLV.  TEE (6/15) with EF 35-40%, diffuse hypokinesis, mild MR, moderate TR, +PFO, no LA appendage thrombus.  - Echo (10/15) with EF 60-65%.   5. CAD: Nonobstructive.  LHC (3/15) with 50% stenosis small PLV. 6. PFO 7. Breast mass  SH: Married, lives in [place], nonsmoker  FH: No premature CAD or cardiomyopathy.  ROS: All systems reviewed and negative except as per HPI.   Current Outpatient Prescriptions  Medication Sig Dispense Refill  . amiodarone (PACERONE) 200 MG tablet TAKE 1 TABLET (200 MG TOTAL) BY MOUTH 2 (TWO) TIMES DAILY. 180 tablet 3  . Cholecalciferol (VITAMIN D-3) 5000 UNITS TABS Take 5,000 Units by mouth daily.    [name] docusate sodium (COLACE) 100 MG capsule Take 100 mg by mouth at bedtime.     [name] ELIQUIS 5 MG TABS tablet TAKE 1 TABLET BY MOUTH TWICE A DAY 60 tablet 5  . Ferrous Sulfate (PX IRON) 27 MG TABS Take 1 tablet by mouth daily.    [name] HYDROcodone-acetaminophen (NORCO/VICODIN) 5-325 MG per tablet Take 2 tablets by mouth every 4 (four) hours as needed. 16 tablet 0  . lisinopril (PRINIVIL,ZESTRIL) 10 MG tablet TAKE  1 TABLET (10 MG TOTAL) BY MOUTH 2 (TWO) TIMES DAILY. 60 tablet 6  . metoprolol succinate (TOPROL-XL) 50 MG 24 hr tablet Take 75mg  (1.5 tablets) by mouth twice daily. 270 tablet 3  . Omega-3 Fatty Acids (FISH OIL) 600 MG CAPS Take 600 Units by mouth 2 (two) times daily.    . rosuvastatin (CRESTOR) 40 MG tablet Take 40 mg by mouth at bedtime.  6  . spironolactone (ALDACTONE) 25 MG tablet Take 1 tablet (25 mg total) by mouth daily. 90 tablet 3  . furosemide (LASIX) 40 MG tablet TAKE 1 TABLET (40 MG TOTAL) BY MOUTH EVERY OTHER DAY. 15 tablet 4  . nitroGLYCERIN (NITROSTAT)  0.4 MG SL tablet Place 0.4 mg under the tongue every 5 (five) minutes as needed for chest pain.     No current facility-administered medications for this encounter.     BP 112/70   Pulse 75   Wt (!) 351 lb (159.2 kg)   SpO2 98%   BMI 56.65 kg/m  General: NAD, obese Neck: JVP 8 cm, no thyromegaly or thyroid nodule.  Lungs: Clear to auscultation bilaterally with normal respiratory effort. CV: Nondisplaced PMI.  Heart mildly tachy, irregular S1/S2, no S3/S4, 1/6 SEM RUSB.  No edema.  No carotid bruit.  Normal pedal pulses.  Abdomen: Soft, nontender, no hepatosplenomegaly, no distention.  Skin: Intact without lesions or rashes.  Neurologic: Alert and oriented x 3.  Psych: Normal affect. Extremities: No clubbing or cyanosis.   Assessment/Plan: 1. Chronic systolic CHF: Nonischemic cardiomyopathy.  EF 35-40% on TEE in 6/15 but then EF back to 60-65% by 10/15.  LHC without significant coronary disease in 3/15.  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  Unfortunately, she is back in atrial fibrillation with mild RVR.  NYHA class II symptoms, very mild volume overload on exam. - Continue current Lasix.   - Continue lisinopril and spironolactone at current doses.  - Increase Toprol XL to 75 mg bid given elevated HR.   - I will arrange for echocardiogram.  2. Atrial fibrillation:  Paroxysmal.  She is back in atrial fibrillation today.  As above, concern for tachy-mediated cardiomyopathy so would like to keep in NSR.  She has been considered a poor candidate for atrial fibrillation ablation.  - Increase amiodarone to 200 mg bid for now. CMET/TSH today.  She will need regular eye exams.    - She will have breast surgery in early February.  I would plan on rate controlling her for now until after her surgery, then doing a TEE-guided DCCV or waiting for 4 weeks of full anticoagulation post-surgery as long as EF remains preserved on echo.    - Check LFTs/TSH in 2 months, will  need yearly eye exam while on amiodarone. I will decrease amiodarone to 200 mg daily.  I am going to keep her on amiodarone for now as it may help keep her in NSR if she has atrial fibrillation ablation.  - Continue Eliquis, will check CBC today.  3. CAD: Mild, nonobstructive by cath.  No ASA given Eliquis use.   4. Hyperlipidemia: Check lipids/LFTs next appt.  Followup in 2 wks.     [name] 06/04/2016

## 2016-06-10 ENCOUNTER — Ambulatory Visit (HOSPITAL_COMMUNITY)
Admission: RE | Admit: 2016-06-10 | Discharge: 2016-06-10 | Disposition: A | Discharge status: Home / Self Care | Payer: Medicare Other | Source: Ambulatory Visit | Attending: Cardiology | Admitting: Cardiology

## 2016-06-10 DIAGNOSIS — E119 Type 2 diabetes mellitus without complications: Secondary | ICD-10-CM | POA: Diagnosis not present

## 2016-06-10 DIAGNOSIS — I350 Nonrheumatic aortic (valve) stenosis: Secondary | ICD-10-CM | POA: Insufficient documentation

## 2016-06-10 DIAGNOSIS — E785 Hyperlipidemia, unspecified: Secondary | ICD-10-CM | POA: Insufficient documentation

## 2016-06-10 DIAGNOSIS — I5022 Chronic systolic (congestive) heart failure: Secondary | ICD-10-CM | POA: Insufficient documentation

## 2016-06-10 DIAGNOSIS — I4891 Unspecified atrial fibrillation: Secondary | ICD-10-CM | POA: Insufficient documentation

## 2016-06-17 ENCOUNTER — Ambulatory Visit (HOSPITAL_COMMUNITY)
Admission: RE | Admit: 2016-06-17 | Discharge: 2016-06-17 | Disposition: A | Discharge status: Home / Self Care | Payer: Medicare Other | Source: Ambulatory Visit | Attending: Cardiology | Admitting: Cardiology

## 2016-06-17 ENCOUNTER — Encounter (HOSPITAL_COMMUNITY): Payer: Self-pay

## 2016-06-17 VITALS — BP 111/76 | HR 65 | Wt 347.0 lb

## 2016-06-17 DIAGNOSIS — Z79899 Other long term (current) drug therapy: Secondary | ICD-10-CM | POA: Insufficient documentation

## 2016-06-17 DIAGNOSIS — E785 Hyperlipidemia, unspecified: Secondary | ICD-10-CM | POA: Diagnosis not present

## 2016-06-17 DIAGNOSIS — I2510 Atherosclerotic heart disease of native coronary artery without angina pectoris: Secondary | ICD-10-CM | POA: Diagnosis not present

## 2016-06-17 DIAGNOSIS — I5022 Chronic systolic (congestive) heart failure: Secondary | ICD-10-CM | POA: Insufficient documentation

## 2016-06-17 DIAGNOSIS — I480 Paroxysmal atrial fibrillation: Secondary | ICD-10-CM

## 2016-06-17 DIAGNOSIS — I428 Other cardiomyopathies: Secondary | ICD-10-CM | POA: Insufficient documentation

## 2016-06-17 DIAGNOSIS — Z7901 Long term (current) use of anticoagulants: Secondary | ICD-10-CM | POA: Diagnosis not present

## 2016-06-17 DIAGNOSIS — Z6841 Body Mass Index (BMI) 40.0 and over, adult: Secondary | ICD-10-CM | POA: Diagnosis not present

## 2016-06-17 LAB — LIPID PANEL
Cholesterol: 193 mg/dL (ref 0–200)
HDL: 53 mg/dL (ref >40)
LDL Cholesterol: 121 mg/dL — ABNORMAL HIGH (ref 0–99)
Total CHOL/HDL Ratio: 3.6 RATIO
Triglycerides: 94 mg/dL (ref <150)
VLDL: 19 mg/dL (ref 0–40)

## 2016-06-17 LAB — HEPATIC FUNCTION PANEL
ALT: 12 U/L — ABNORMAL LOW (ref 14–54)
AST: 16 U/L (ref 15–41)
Albumin: 3.9 g/dL (ref 3.5–5.0)
Alkaline Phosphatase: 69 U/L (ref 38–126)
Bilirubin, Direct: <0.1 mg/dL — ABNORMAL LOW (ref 0.1–0.5)
Total Bilirubin: 0.9 mg/dL (ref 0.3–1.2)
Total Protein: 7.3 g/dL (ref 6.5–8.1)

## 2016-06-17 NOTE — Patient Instructions (Signed)
EKG today within normal limits, normal sinus rhythm.  Routine lab work today. Will notify you of abnormal results, otherwise no news is good news!  Follow up 2-3 weeks with Dr. Aundra Dubin and repeat EKG.  Do the following things EVERYDAY: 1) Weigh yourself in the morning before breakfast. Write it down and keep it in a log. 2) Take your medicines as prescribed 3) Eat low salt foods-Limit salt (sodium) to 2000 mg per day.  4) Stay as active as you can everyday 5) Limit all fluids for the day to less than 2 liters

## 2016-06-17 NOTE — Progress Notes (Signed)
Patient ID: Kimberly [name], female   DOB: [birth year], 61 y.o.   MRN: [id number] PCP: Dr. Gery Pray [facility]) Cardiology: Dr. Aundra Dubin  61 yo with history of paroxysmal atrial fibrillation, obesity, and nonischemic cardiomyopathy presents for cardiology followup.  Patient was admitted in 3/15 at [facility] with atrial fibrillation and CHF.  TEE was done in preparation for DCCV, showing EF 25-30% but there was LA thrombus so DCCV was not done.  Plan was for anticoagulation x 3 months followed by TEE-guided DCCV in the OR (due to patient's body habitus).  LHC was also done, showing mild nonobstructive CAD.  Patient was diuresed with IV Lasix in the hospital and started on apixaban.   Patient had TEE again in 6/15, showing EF 35-40% with diffuse hypokinesis and no LAA thrombus.  She was cardioverted to NSR (with difficulty).  I started her at that time on amiodarone.  She then went back into atrial fibrillation.  After she had been loaded on amiodarone, cardioverted her again in 7/15.  She went back into NSR easily this time.  She has been back and forth between NSR and atrial fibrillation since that time.    Today she returns for HF follow up. Plan for lumpectomy on left breast February 7th. Last visit back in A fib so Toporol XL and amio increased. Overall feeling fine. Denies SOB/PND/Orthopnea. Appetite ok. Denies CP. No bleeding problems. Not weighin g at home. Taking all medications.   ECG: Sinus Brady 53 bpm  Labs (4/15): HCT 35.3 Labs (5/15): K 3.9, creatinine 1.0 Labs (6/15): K 4.3, creatinine 1.0, BNP 217, LDL 106 Labs (7/15): K 4.5, creatinine 1.24, LFTs normal, TSH normal  PMH: 1. Obese 2. Atrial fibrillation: Paroxysmal.  Unable to cardiovert in 3/15 due to LAA thrombus noted on TEE. TEE in 6/15 without LAA thrombus.  Patient had cardioversion to NSR with difficulty but back in atrial fibrillation by 7/15 appt.  She was started on amiodarone and cardioverted in 7/15, but was back in atrial  fibrillation by 8/15 despite amiodarone use.  3. H/o TKR 4. Nonischemic cardiomyopathy: TEE (3/15) with EF 25-30%, moderate MR, PA systolic pressure 51 mmHg, moderate to severe TR, LA thrombus was noted.   LHC (3/15) with 50% stenosis small PLV.  TEE (6/15) with EF 35-40%, diffuse hypokinesis, mild MR, moderate TR, +PFO, no LA appendage thrombus.  - Echo (10/15) with EF 60-65%.   5. CAD: Nonobstructive.  LHC (3/15) with 50% stenosis small PLV. 6. PFO 7. Left Breast mass 8. ECHO 06/10/2016 EF 60-65% Mild AS  SH: Married, lives in [place], nonsmoker  FH: No premature CAD or cardiomyopathy.  ROS: All systems reviewed and negative except as per HPI.   Current Outpatient Prescriptions  Medication Sig Dispense Refill  . amiodarone (PACERONE) 200 MG tablet TAKE 1 TABLET (200 MG TOTAL) BY MOUTH 2 (TWO) TIMES DAILY. 180 tablet 3  . Cholecalciferol (VITAMIN D-3) 5000 UNITS TABS Take 5,000 Units by mouth daily.    [name] docusate sodium (COLACE) 100 MG capsule Take 100 mg by mouth at bedtime.     [name] ELIQUIS 5 MG TABS tablet TAKE 1 TABLET BY MOUTH TWICE A DAY 60 tablet 5  . Ferrous Sulfate (PX IRON) 27 MG TABS Take 1 tablet by mouth daily.    . furosemide (LASIX) 40 MG tablet TAKE 1 TABLET (40 MG TOTAL) BY MOUTH EVERY OTHER DAY. 15 tablet 4  . HYDROcodone-acetaminophen (NORCO/VICODIN) 5-325 MG per tablet Take 2 tablets by mouth every 4 (four)  hours as needed. 16 tablet 0  . lisinopril (PRINIVIL,ZESTRIL) 10 MG tablet TAKE 1 TABLET (10 MG TOTAL) BY MOUTH 2 (TWO) TIMES DAILY. 60 tablet 6  . metoprolol succinate (TOPROL-XL) 50 MG 24 hr tablet Take 75mg  (1.5 tablets) by mouth twice daily. 270 tablet 3  . nitroGLYCERIN (NITROSTAT) 0.4 MG SL tablet Place 0.4 mg under the tongue every 5 (five) minutes as needed for chest pain.    . Omega-3 Fatty Acids (FISH OIL) 600 MG CAPS Take 600 Units by mouth 2 (two) times daily.    . rosuvastatin (CRESTOR) 40 MG tablet Take 40 mg by mouth at bedtime.  6  . spironolactone  (ALDACTONE) 25 MG tablet Take 1 tablet (25 mg total) by mouth daily. 90 tablet 3   No current facility-administered medications for this encounter.     BP 111/76 (BP Location: Right Arm, Patient Position: Sitting, Cuff Size: Large)   Pulse 65   Wt (!) 347 lb (157.4 kg)   SpO2 98%   BMI 56.01 kg/m  General: NAD. Walked in the clinic. [name]  Neck: JVP 5-6 cm, no thyromegaly or thyroid nodule.  Lungs: CTAB. CV: Nondisplaced PMI.  Regular S1/S2, no S3/S4, 1/6 SEM RUSB.  No edema.  No carotid bruit.  Normal pedal pulses.  Abdomen: Obese, NT, ND, soft.    Skin: Intact without lesions or rashes. Warm  Neurologic: Alert and oriented x 3.  Psych: Normal affect. Extremities: No clubbing or cyanosis. No lower extremity edema.  Assessment/Plan: 1. Chronic systolic CHF: Nonischemic cardiomyopathy.  EF 35-40% on TEE in 6/15 but then EF back to 60-65% by 10/15.  LHC without significant coronary disease in 3/15.  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   ECHO on 06/10/2016 EF 60-65% mild AS. Remains preserved.  NYHA II. Volume status stable. Continue lasix, lisinopril, spiro, and toprol xl at current dose.  2. Atrial fibrillation:  Paroxysmal.   Today she is back in Sinus Rhythm. Continue current dose bb and amio until lumpectomy.  Continue eliquis. LFTs today.  She will need regular eye exams.    3. CAD: Mild, nonobstructive by cath. No chest pain.  No ASA given Eliquis use.   4. Hyperlipidemia: Check lipids today. Continue current dose of crestor. 5. Morbid Obesity - needs to lose weight . Discussed portion control.   Follow up in 3 week with Dr Aundra Dubin. Anticipate cutting back amio dose to 200 mg daily if sill in NSR.   Kiko Ripp NP-C  06/17/2016

## 2016-06-20 ENCOUNTER — Other Ambulatory Visit: Payer: Self-pay | Admitting: Internal Medicine

## 2016-07-08 ENCOUNTER — Encounter (HOSPITAL_COMMUNITY): Payer: Self-pay

## 2016-07-08 ENCOUNTER — Ambulatory Visit (HOSPITAL_COMMUNITY)
Admission: RE | Admit: 2016-07-08 | Discharge: 2016-07-08 | Disposition: A | Discharge status: Home / Self Care | Payer: Medicare Other | Source: Ambulatory Visit | Attending: Cardiology | Admitting: Cardiology

## 2016-07-08 DIAGNOSIS — E785 Hyperlipidemia, unspecified: Secondary | ICD-10-CM

## 2016-07-08 DIAGNOSIS — I5032 Chronic diastolic (congestive) heart failure: Secondary | ICD-10-CM | POA: Diagnosis not present

## 2016-07-08 DIAGNOSIS — Z79899 Other long term (current) drug therapy: Secondary | ICD-10-CM | POA: Diagnosis not present

## 2016-07-08 DIAGNOSIS — Z6841 Body Mass Index (BMI) 40.0 and over, adult: Secondary | ICD-10-CM | POA: Insufficient documentation

## 2016-07-08 DIAGNOSIS — I350 Nonrheumatic aortic (valve) stenosis: Secondary | ICD-10-CM | POA: Diagnosis not present

## 2016-07-08 DIAGNOSIS — I480 Paroxysmal atrial fibrillation: Secondary | ICD-10-CM

## 2016-07-08 DIAGNOSIS — I5022 Chronic systolic (congestive) heart failure: Secondary | ICD-10-CM | POA: Insufficient documentation

## 2016-07-08 DIAGNOSIS — I2510 Atherosclerotic heart disease of native coronary artery without angina pectoris: Secondary | ICD-10-CM | POA: Diagnosis not present

## 2016-07-08 DIAGNOSIS — Z7901 Long term (current) use of anticoagulants: Secondary | ICD-10-CM | POA: Insufficient documentation

## 2016-07-08 DIAGNOSIS — I428 Other cardiomyopathies: Secondary | ICD-10-CM | POA: Diagnosis not present

## 2016-07-08 MED ORDER — AMIODARONE HCL 200 MG PO TABS: 200.0000 mg | ORAL_TABLET | Freq: Every day | ORAL | 3 refills | Status: DC

## 2016-07-08 NOTE — Patient Instructions (Signed)
Decrease Amiodarone to 200mg  daily.  Follow up with Dr.McLean in 3 months

## 2016-07-10 NOTE — Progress Notes (Signed)
Patient ID: Kimberly [name], female   DOB: 04/12/1956, 61 y.o.   MRN: [id number] PCP: Dr. Gery Pray [facility]) Cardiology: Dr. Aundra Dubin  61 yo with history of paroxysmal atrial fibrillation, obesity, and nonischemic cardiomyopathy presents for cardiology followup.  Patient was admitted in 3/15 at [facility] with atrial fibrillation and CHF.  TEE was done in preparation for DCCV, showing EF 25-30% but there was LA thrombus so DCCV was not done.  Plan was for anticoagulation x 3 months followed by TEE-guided DCCV in the OR (due to patient's body habitus).  LHC was also done, showing mild nonobstructive CAD.  Patient was diuresed with IV Lasix in the hospital and started on apixaban.   Patient had TEE again in 6/15, showing EF 35-40% with diffuse hypokinesis and no LAA thrombus.  She was cardioverted to NSR (with difficulty).  I started her at that time on amiodarone.  She then went back into atrial fibrillation.  After she had been loaded on amiodarone, cardioverted her again in 7/15 to NSR.  She has been back and forth between NSR and atrial fibrillation since that time.    She had gone back into atrial fibrillation in 1/18 and amiodarone was increased to bid in preparation for DCCV.  However, she is back in NSR.  No exertional dyspnea or chest pain. No PND/Orthopnea. Appetite ok. No bleeding problems. Taking all medications.   ECG: NSR at 57  Labs (4/15): HCT 35.3 Labs (5/15): K 3.9, creatinine 1.0 Labs (6/15): K 4.3, creatinine 1.0, BNP 217, LDL 106 Labs (7/15): K 4.5, creatinine 1.24, LFTs normal, TSH normal  PMH: 1. Obese 2. Atrial fibrillation: Paroxysmal.  Unable to cardiovert in 3/15 due to LAA thrombus noted on TEE. TEE in 6/15 without LAA thrombus.  Patient had cardioversion to NSR with difficulty but back in atrial fibrillation by 7/15 appt.  She was started on amiodarone and cardioverted in 7/15.  Has been back and forth between AF and NSR since then.  3. H/o TKR 4. Nonischemic  cardiomyopathy: TEE (3/15) with EF 25-30%, moderate MR, PA systolic pressure 51 mmHg, moderate to severe TR, LA thrombus was noted.   LHC (3/15) with 50% stenosis small PLV.  TEE (6/15) with EF 35-40%, diffuse hypokinesis, mild MR, moderate TR, +PFO, no LA appendage thrombus.  - Echo (10/15) with EF 60-65%.   - ECHO 06/10/2016 EF 60-65% Mild AS 5. CAD: Nonobstructive.  LHC (3/15) with 50% stenosis small PLV. 6. PFO 7. Left Breast mass: Was taken for lumpectomy in 1/18 but mass had resolved.  8. Aortic stenosis: Mild.   SH: Married, lives in [place], nonsmoker  FH: No premature CAD or cardiomyopathy.  ROS: All systems reviewed and negative except as per HPI.   Current Outpatient Prescriptions  Medication Sig Dispense Refill  . amiodarone (PACERONE) 200 MG tablet Take 1 tablet (200 mg total) by mouth daily. 90 tablet 3  . Cholecalciferol (VITAMIN D-3) 5000 UNITS TABS Take 5,000 Units by mouth daily.    [name] docusate sodium (COLACE) 100 MG capsule Take 100 mg by mouth at bedtime.     [name] ELIQUIS 5 MG TABS tablet TAKE 1 TABLET BY MOUTH TWICE A DAY 60 tablet 5  . Ferrous Sulfate (PX IRON) 27 MG TABS Take 1 tablet by mouth daily.    . furosemide (LASIX) 40 MG tablet TAKE 1 TABLET (40 MG TOTAL) BY MOUTH EVERY OTHER DAY. 15 tablet 4  . HYDROcodone-acetaminophen (NORCO/VICODIN) 5-325 MG per tablet Take 2 tablets by mouth every  4 (four) hours as needed. 16 tablet 0  . lisinopril (PRINIVIL,ZESTRIL) 10 MG tablet TAKE 1 TABLET (10 MG TOTAL) BY MOUTH 2 (TWO) TIMES DAILY. 60 tablet 6  . metoprolol succinate (TOPROL-XL) 50 MG 24 hr tablet Take 75mg  (1.5 tablets) by mouth twice daily. 270 tablet 3  . nitroGLYCERIN (NITROSTAT) 0.4 MG SL tablet Place 0.4 mg under the tongue every 5 (five) minutes as needed for chest pain.    . Omega-3 Fatty Acids (FISH OIL) 600 MG CAPS Take 600 Units by mouth 2 (two) times daily.    . rosuvastatin (CRESTOR) 40 MG tablet Take 40 mg by mouth at bedtime.  6  . spironolactone  (ALDACTONE) 25 MG tablet Take 1 tablet (25 mg total) by mouth daily. 90 tablet 3   No current facility-administered medications for this encounter.     BP (!) 142/74   Pulse 61   Wt (!) 346 lb 4 oz (157.1 kg)   SpO2 100%   BMI 55.89 kg/m  General: NAD. Walked in the clinic. [name]  Neck: JVP 5-6 cm, no thyromegaly or thyroid nodule.  Lungs: CTAB. CV: Nondisplaced PMI.  Regular S1/S2, no S3/S4, 1/6 SEM RUSB.  No edema.  No carotid bruit.  Normal pedal pulses.  Abdomen: Obese, NT, ND, soft.    Skin: Intact without lesions or rashes. Warm  Neurologic: Alert and oriented x 3.  Psych: Normal affect. Extremities: No clubbing or cyanosis. No lower extremity edema.  Assessment/Plan: 1. Chronic systolic CHF: Nonischemic cardiomyopathy.  EF 35-40% on TEE in 6/15 but then EF back to 60-65% by 10/15 and remained normal on 1/18 echo.  LHC without significant coronary disease in 3/15.  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 NYHA II. Volume status stable.  - Continue lasix, lisinopril, spiro, and toprol xl at current dose.  2. Atrial fibrillation:  Paroxysmal.  Today she is back in NSR.  - Continue eliquis.  - Decrease amiodarone back to 200 mg daily.  Recent LFTs and TSH normal.  She will need regular eye exams.    3. CAD: Mild, nonobstructive by cath. No chest pain.  No ASA given Eliquis use.   4. Hyperlipidemia: Good lipids 1/18. Continue current dose of crestor. 5. Morbid Obesity: needs to lose weight. Discussed portion control.   Followup in 3 months.   [name] 07/10/2016

## 2016-07-19 ENCOUNTER — Other Ambulatory Visit (HOSPITAL_COMMUNITY): Payer: Self-pay | Admitting: Adult Health

## 2016-07-19 DIAGNOSIS — I4891 Unspecified atrial fibrillation: Secondary | ICD-10-CM

## 2016-07-29 IMAGING — DX DG KNEE COMPLETE 4+V*R*
4 series · 4 of 4 positions shown · non-contrast
Comparison: None.

CLINICAL DATA: Ongoing knee pain for 2 months.

EXAM:
RIGHT KNEE - COMPLETE 4+ VIEW

[knee ap]
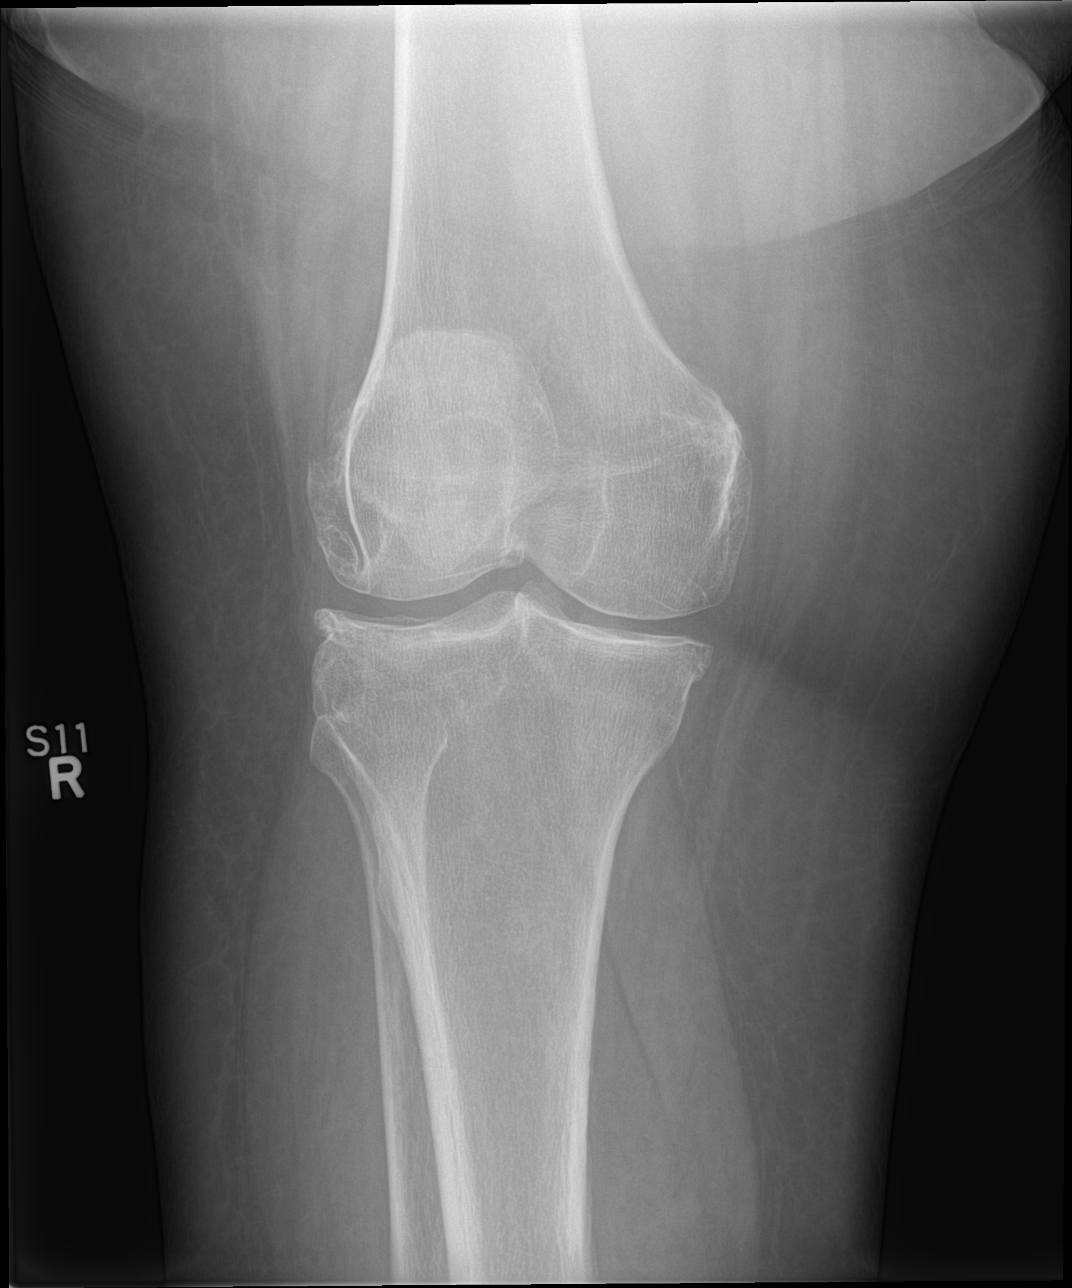

[knee lat]
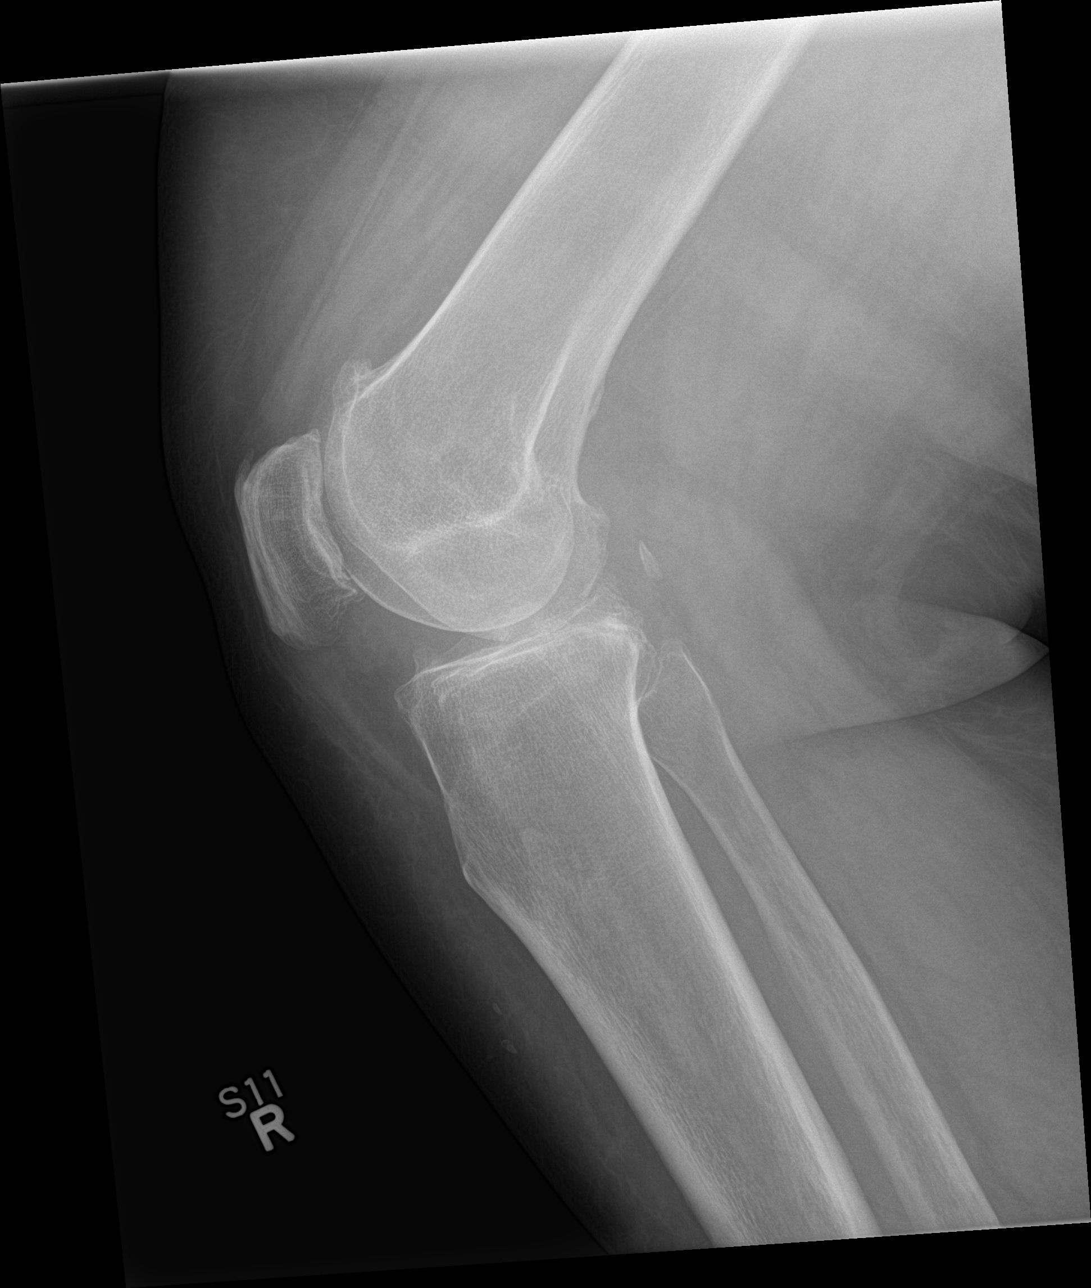

[knee obl (1 of 2)]
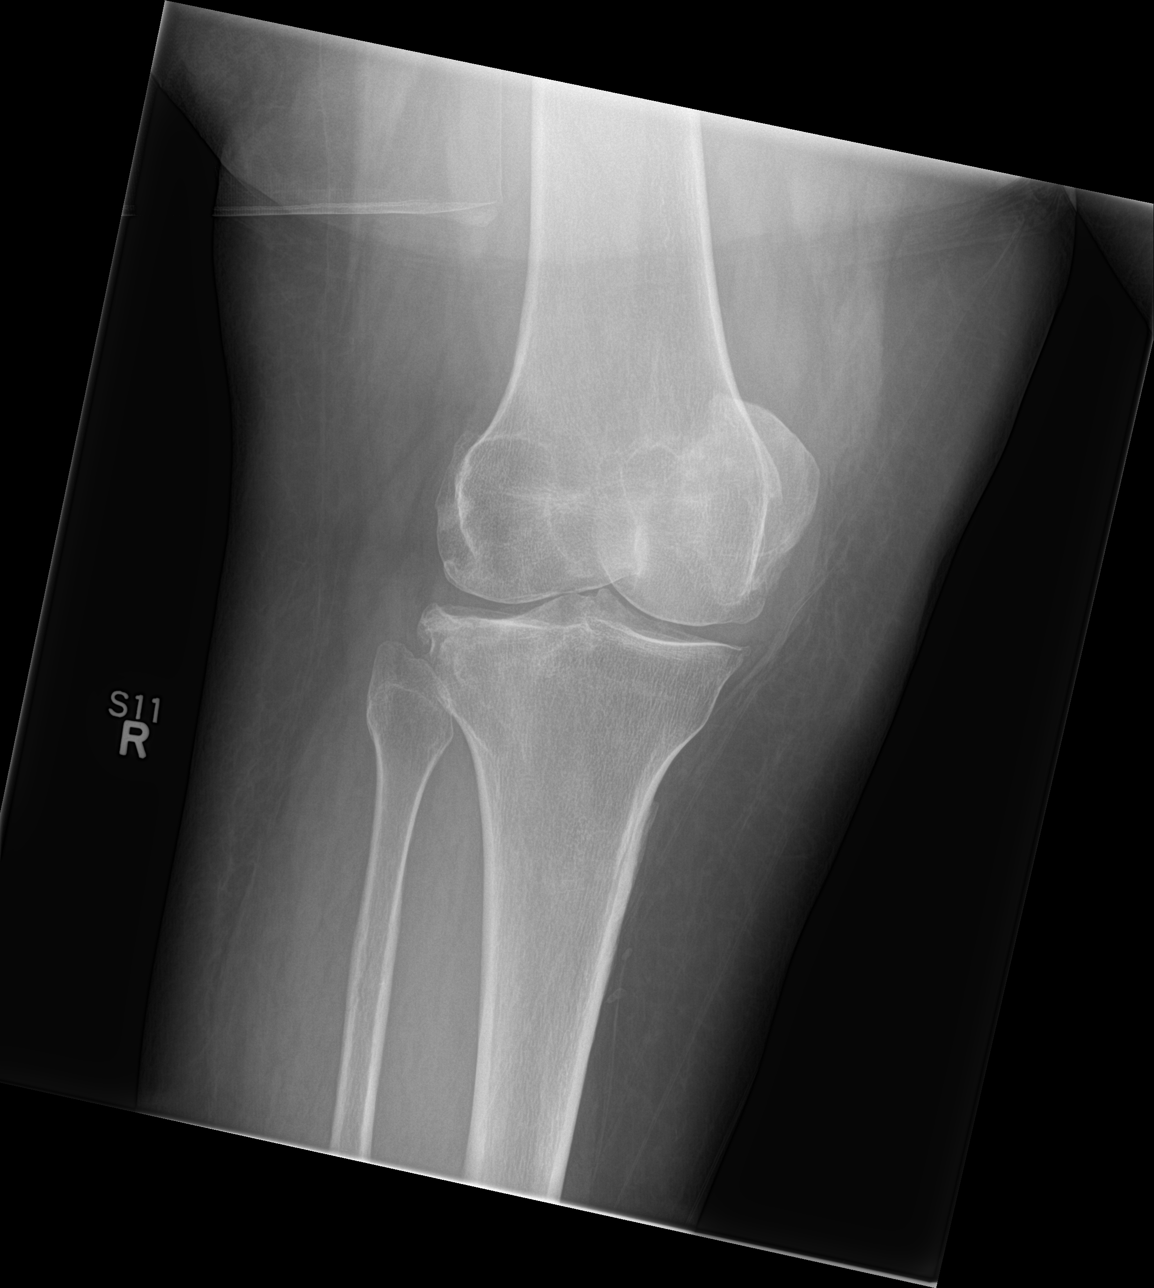

[knee obl (2 of 2)]
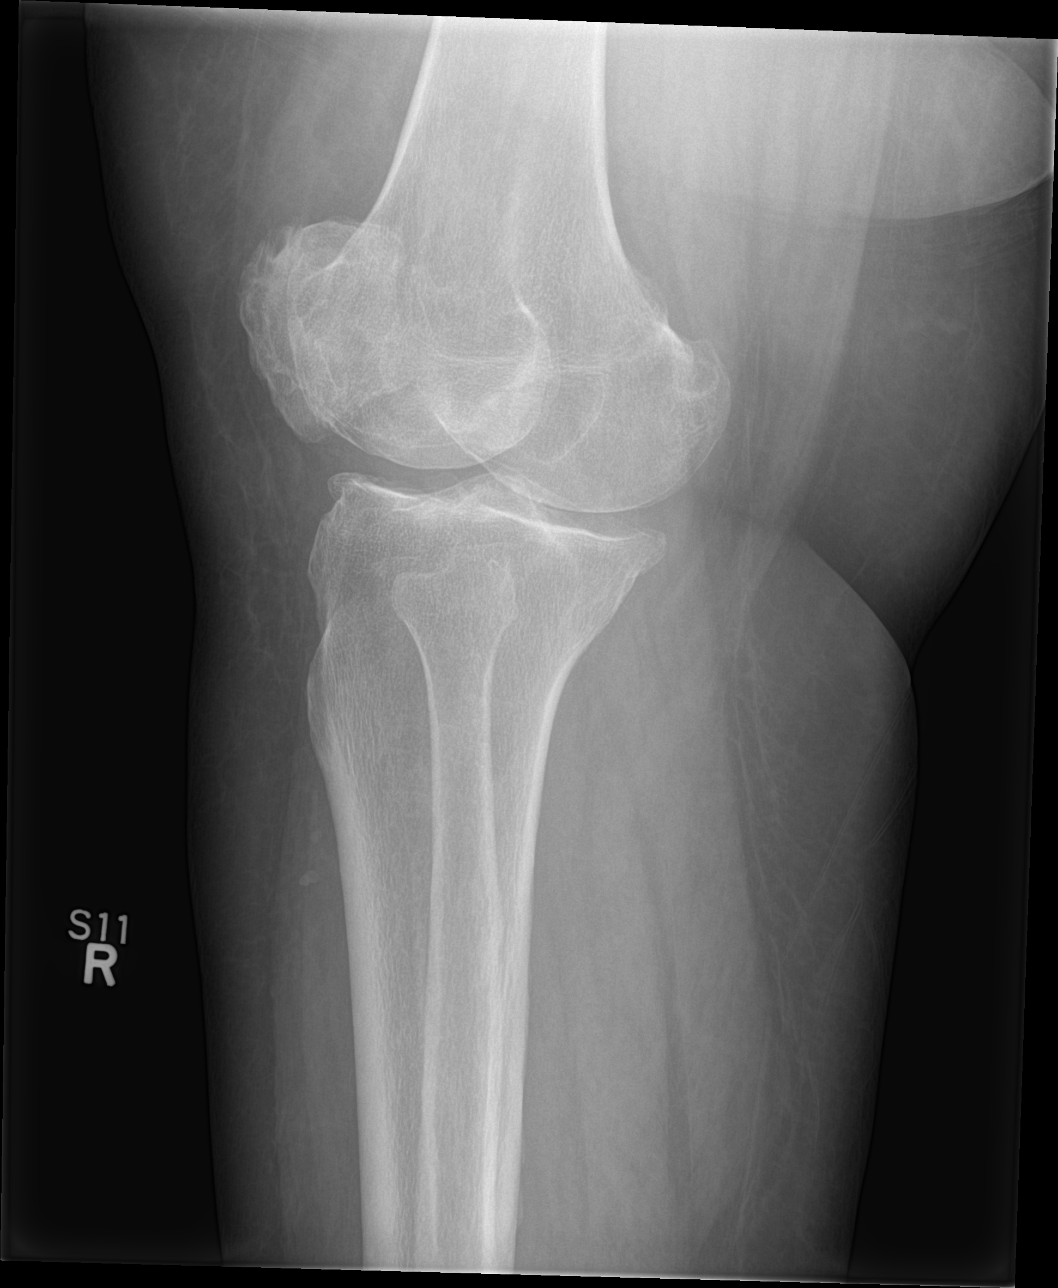

[4 of 4 positions shown; findings below may reference images not displayed]

FINDINGS: No joint effusion. There is no fracture or subluxation identified.
There is tricompartment marginal spur formation with patellofemoral
and medial compartment narrowing.
IMPRESSION: 1. No acute findings.
2. Moderate osteoarthritis.

## 2016-10-06 ENCOUNTER — Ambulatory Visit (HOSPITAL_COMMUNITY)
Admission: RE | Admit: 2016-10-06 | Discharge: 2016-10-06 | Disposition: A | Discharge status: Home / Self Care | Payer: Medicare Other | Source: Ambulatory Visit | Attending: Cardiology | Admitting: Cardiology

## 2016-10-06 ENCOUNTER — Encounter (HOSPITAL_COMMUNITY): Payer: Self-pay

## 2016-10-06 VITALS — BP 140/64 | HR 57 | Ht 66.0 in | Wt 346.5 lb

## 2016-10-06 DIAGNOSIS — E785 Hyperlipidemia, unspecified: Secondary | ICD-10-CM | POA: Insufficient documentation

## 2016-10-06 DIAGNOSIS — M25562 Pain in left knee: Secondary | ICD-10-CM | POA: Insufficient documentation

## 2016-10-06 DIAGNOSIS — M25561 Pain in right knee: Secondary | ICD-10-CM | POA: Insufficient documentation

## 2016-10-06 DIAGNOSIS — Z79899 Other long term (current) drug therapy: Secondary | ICD-10-CM | POA: Diagnosis not present

## 2016-10-06 DIAGNOSIS — I480 Paroxysmal atrial fibrillation: Secondary | ICD-10-CM

## 2016-10-06 DIAGNOSIS — I491 Atrial premature depolarization: Secondary | ICD-10-CM | POA: Insufficient documentation

## 2016-10-06 DIAGNOSIS — I428 Other cardiomyopathies: Secondary | ICD-10-CM | POA: Insufficient documentation

## 2016-10-06 DIAGNOSIS — Z6841 Body Mass Index (BMI) 40.0 and over, adult: Secondary | ICD-10-CM | POA: Diagnosis not present

## 2016-10-06 DIAGNOSIS — I2510 Atherosclerotic heart disease of native coronary artery without angina pectoris: Secondary | ICD-10-CM | POA: Insufficient documentation

## 2016-10-06 DIAGNOSIS — I5022 Chronic systolic (congestive) heart failure: Secondary | ICD-10-CM | POA: Diagnosis not present

## 2016-10-06 DIAGNOSIS — Z7901 Long term (current) use of anticoagulants: Secondary | ICD-10-CM | POA: Insufficient documentation

## 2016-10-06 LAB — COMPREHENSIVE METABOLIC PANEL
ALT: 12 U/L — ABNORMAL LOW (ref 14–54)
AST: 17 U/L (ref 15–41)
Albumin: 3.9 g/dL (ref 3.5–5.0)
Alkaline Phosphatase: 69 U/L (ref 38–126)
Anion gap: 7 (ref 5–15)
BUN: 21 mg/dL — ABNORMAL HIGH (ref 6–20)
CO2: 25 mmol/L (ref 22–32)
Calcium: 9.4 mg/dL (ref 8.9–10.3)
Chloride: 108 mmol/L (ref 101–111)
Creatinine, Ser: 1.33 mg/dL — ABNORMAL HIGH (ref 0.44–1.00)
GFR calc Af Amer: 50 mL/min — ABNORMAL LOW (ref >60)
GFR calc non Af Amer: 43 mL/min — ABNORMAL LOW (ref >60)
Glucose, Bld: 102 mg/dL — ABNORMAL HIGH (ref 65–99)
Potassium: 4.5 mmol/L (ref 3.5–5.1)
Sodium: 140 mmol/L (ref 135–145)
Total Bilirubin: 0.7 mg/dL (ref 0.3–1.2)
Total Protein: 7 g/dL (ref 6.5–8.1)

## 2016-10-06 LAB — TSH: TSH: 1.105 u[IU]/mL (ref 0.350–4.500)

## 2016-10-06 LAB — CBC
HCT: 29.8 % — ABNORMAL LOW (ref 36.0–46.0)
Hemoglobin: 9.4 g/dL — ABNORMAL LOW (ref 12.0–15.0)
MCH: 28.7 pg (ref 26.0–34.0)
MCHC: 31.5 g/dL (ref 30.0–36.0)
MCV: 91.1 fL (ref 78.0–100.0)
Platelets: 232 10*3/uL (ref 150–400)
RBC: 3.27 MIL/uL — ABNORMAL LOW (ref 3.87–5.11)
RDW: 13.3 % (ref 11.5–15.5)
WBC: 4.1 10*3/uL (ref 4.0–10.5)

## 2016-10-06 NOTE — Patient Instructions (Signed)
Routine lab work today. Will notify you of abnormal results  Follow up with Dr.McLean in 6 months  

## 2016-10-07 NOTE — Progress Notes (Signed)
Patient ID: Kimberly [name], female   DOB: 18-Jun-1955, 61 y.o.   MRN: [id number] PCP: Dr. Gery Pray [facility]) Cardiology: Dr. Aundra Dubin  61 yo with history of paroxysmal atrial fibrillation, obesity, and nonischemic cardiomyopathy presents for cardiology followup.  Patient was admitted in 3/15 at [facility] with atrial fibrillation and CHF.  TEE was done in preparation for DCCV, showing EF 25-30% but there was LA thrombus so DCCV was not done.  Plan was for anticoagulation x 3 months followed by TEE-guided DCCV in the OR (due to patient's body habitus).  LHC was also done, showing mild nonobstructive CAD.  Patient was diuresed with IV Lasix in the hospital and started on apixaban.   Patient had TEE again in 6/15, showing EF 35-40% with diffuse hypokinesis and no LAA thrombus.  She was cardioverted to NSR (with difficulty).  I started her at that time on amiodarone.  She then went back into atrial fibrillation.  After she had been loaded on amiodarone, cardioverted her again in 7/15 to NSR.  She has been back and forth between NSR and atrial fibrillation since that time.    She had gone back into atrial fibrillation in 1/18 and amiodarone was increased to bid in preparation for DCCV. However, she spontaneously converted to NSR.  She is in NSR today.  No palpitations.  No exertional dyspnea or chest pain.  She can climb a flight of stairs slowly.  Uses cane because of bilateral knee pain. No PND/Orthopnea.  No BRBPR/melena.  No lightheadedness/dizziness.   Weight is stable.   ECG: Personally reviewed, NSR with PAC, otherwise normal.   Labs (4/15): HCT 35.3 Labs (5/15): K 3.9, creatinine 1.0 Labs (6/15): K 4.3, creatinine 1.0, BNP 217, LDL 106 Labs (7/15): K 4.5, creatinine 1.24, LFTs normal, TSH normal Labs (1/18): K 4.9, creatinine 1.16, TSH normal, LFTs normal, LDL 121, BNP 131  PMH: 1. Obese 2. Atrial fibrillation: Paroxysmal.  Unable to cardiovert in 3/15 due to LAA thrombus noted on TEE. TEE in 6/15  without LAA thrombus.  Patient had cardioversion to NSR with difficulty but back in atrial fibrillation by 60/15 appt.  She was started on amiodarone and cardioverted in 7/15.  Has been back and forth between AF and NSR since then.  3. H/o TKR 4. Nonischemic cardiomyopathy: TEE (3/15) with EF 25-30%, moderate MR, PA systolic pressure 51 mmHg, moderate to severe TR, LA thrombus was noted.   LHC (3/15) with 50% stenosis small PLV.  TEE (6/15) with EF 35-40%, diffuse hypokinesis, mild MR, moderate TR, +PFO, no LA appendage thrombus.  - Echo (10/15) with EF 60-65%.   - ECHO 06/10/2016 EF 60-65% Mild AS 5. CAD: Nonobstructive.  LHC (3/15) with 50% stenosis small PLV. 6. PFO 7. Left Breast mass: Was taken for lumpectomy in 1/18 but mass had resolved.  8. Aortic stenosis: Mild.   SH: Married, lives in [place], nonsmoker  FH: No premature CAD or cardiomyopathy.  ROS: All systems reviewed and negative except as per HPI.   Current Outpatient Prescriptions  Medication Sig Dispense Refill  . amiodarone (PACERONE) 200 MG tablet Take 1 tablet (200 mg total) by mouth daily. 90 tablet 3  . Cholecalciferol (VITAMIN D-3) 5000 UNITS TABS Take 5,000 Units by mouth daily.    [name] docusate sodium (COLACE) 100 MG capsule Take 100 mg by mouth at bedtime.     [name] ELIQUIS 5 MG TABS tablet TAKE 1 TABLET BY MOUTH TWICE A DAY 60 tablet 5  . Ferrous Sulfate (PX  IRON) 27 MG TABS Take 1 tablet by mouth daily.    . furosemide (LASIX) 40 MG tablet TAKE 1 TABLET (40 MG TOTAL) BY MOUTH EVERY OTHER DAY. 15 tablet 4  . HYDROcodone-acetaminophen (NORCO/VICODIN) 5-325 MG per tablet Take 2 tablets by mouth every 4 (four) hours as needed. 16 tablet 0  . lisinopril (PRINIVIL,ZESTRIL) 10 MG tablet TAKE 1 TABLET (10 MG TOTAL) BY MOUTH 2 (TWO) TIMES DAILY. 60 tablet 6  . metoprolol succinate (TOPROL-XL) 50 MG 24 hr tablet Take 75mg  (1.5 tablets) by mouth twice daily. 270 tablet 3  . nitroGLYCERIN (NITROSTAT) 0.4 MG SL tablet Place 0.4 mg  under the tongue every 5 (five) minutes as needed for chest pain.    . Omega-3 Fatty Acids (FISH OIL) 600 MG CAPS Take 600 Units by mouth 2 (two) times daily.    . rosuvastatin (CRESTOR) 40 MG tablet Take 40 mg by mouth at bedtime.  6  . spironolactone (ALDACTONE) 25 MG tablet TAKE 1 TABLET (25 MG TOTAL) BY MOUTH DAILY. 90 tablet 1   No current facility-administered medications for this encounter.     BP 140/64   Pulse (!) 57   Ht 5\' 6"  (1.676 m)   Wt (!) 346 lb 8 oz (157.2 kg)   SpO2 99%   BMI 55.93 kg/m  General: NAD.   Neck: Thick, JVP 7 cm, no thyromegaly or thyroid nodule.  Lungs: Clear to auscultation bilaterally. CV: Nondisplaced PMI.  Regular S1/S2, no S3/S4, 1/6 SEM RUSB.  No edema.  No carotid bruit.  Normal pedal pulses.  Abdomen: Obese, NT, ND, soft.    Skin: Intact without lesions or rashes. Warm  Neurologic: Alert and oriented x 3.  Psych: Normal affect. Extremities: No clubbing or cyanosis. No lower extremity edema.  Assessment/Plan: 1. Chronic systolic CHF: Nonischemic cardiomyopathy.  EF 35-40% on TEE in 6/15 but then EF back to 60-65% by 10/15 and remained normal on 1/18 echo.  LHC without significant coronary disease in 3/15.  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 NYHA II. Volume status stable.  - Continue lasix, lisinopril, spironolactone, and Toprol XL at current doses.  2. Atrial fibrillation:  Paroxysmal.  She remains in NSR today.  - Continue eliquis. Check CBC.  - Continue amiodarone 200 mg daily.  Will need to check LFTs and TSH.  She will need regular eye exams on amiodarone (sees optometrist regularly).    3. CAD: Mild, nonobstructive by cath. No chest pain.  No ASA given Eliquis use.   4. Hyperlipidemia: Good lipids 1/18. Continue current dose of crestor. 5. Morbid Obesity: needs to lose weight. Discussed portion control, hard to exercise with knee pain but could consider walking in a pool. Weight stable  compared to last appointment, need it to start going down.   Followup in 6 months.   [name] 10/07/2016

## 2016-10-14 ENCOUNTER — Encounter (HOSPITAL_COMMUNITY): Payer: Self-pay | Admitting: Nurse Practitioner

## 2016-10-14 ENCOUNTER — Ambulatory Visit (HOSPITAL_COMMUNITY)
Admission: RE | Admit: 2016-10-14 | Discharge: 2016-10-14 | Disposition: A | Discharge status: Home / Self Care | Payer: Medicare Other | Source: Ambulatory Visit | Attending: Nurse Practitioner | Admitting: Nurse Practitioner

## 2016-10-14 VITALS — BP 112/66 | HR 109 | Ht 66.0 in | Wt 345.0 lb

## 2016-10-14 DIAGNOSIS — Z6841 Body Mass Index (BMI) 40.0 and over, adult: Secondary | ICD-10-CM | POA: Diagnosis not present

## 2016-10-14 DIAGNOSIS — I350 Nonrheumatic aortic (valve) stenosis: Secondary | ICD-10-CM | POA: Diagnosis not present

## 2016-10-14 DIAGNOSIS — Z9889 Other specified postprocedural states: Secondary | ICD-10-CM | POA: Diagnosis not present

## 2016-10-14 DIAGNOSIS — Z7902 Long term (current) use of antithrombotics/antiplatelets: Secondary | ICD-10-CM | POA: Insufficient documentation

## 2016-10-14 DIAGNOSIS — N289 Disorder of kidney and ureter, unspecified: Secondary | ICD-10-CM | POA: Insufficient documentation

## 2016-10-14 DIAGNOSIS — M170 Bilateral primary osteoarthritis of knee: Secondary | ICD-10-CM | POA: Diagnosis not present

## 2016-10-14 DIAGNOSIS — I480 Paroxysmal atrial fibrillation: Secondary | ICD-10-CM

## 2016-10-14 DIAGNOSIS — E119 Type 2 diabetes mellitus without complications: Secondary | ICD-10-CM | POA: Insufficient documentation

## 2016-10-14 DIAGNOSIS — I481 Persistent atrial fibrillation: Secondary | ICD-10-CM | POA: Insufficient documentation

## 2016-10-14 DIAGNOSIS — Z955 Presence of coronary angioplasty implant and graft: Secondary | ICD-10-CM | POA: Insufficient documentation

## 2016-10-14 DIAGNOSIS — Z823 Family history of stroke: Secondary | ICD-10-CM | POA: Diagnosis not present

## 2016-10-14 DIAGNOSIS — I10 Essential (primary) hypertension: Secondary | ICD-10-CM | POA: Insufficient documentation

## 2016-10-14 DIAGNOSIS — E785 Hyperlipidemia, unspecified: Secondary | ICD-10-CM | POA: Insufficient documentation

## 2016-10-14 DIAGNOSIS — I429 Cardiomyopathy, unspecified: Secondary | ICD-10-CM | POA: Diagnosis not present

## 2016-10-14 NOTE — Progress Notes (Signed)
Primary Care Physician: Windell Hummingbird, PA-C Referring Physician:  Dr Mila Homer is a 61 y.o. female with a h/o persistent atrial fibrillation, obesity, and nonischemic cardiomyopathy presents for electrophysiology evaluation .[name] Patient was admitted in 3/15 at [facility] with atrial fibrillation and CHF. TEE was done in preparation for DCCV, showing EF 25-30% but there was LA thrombus so DCCV was not done. Plan was for anticoagulation x 3 months followed by TEE-guided DCCV. LHC was also done, showing mild nonobstructive CAD. Patient was diuresed with IV Lasix in the hospital and started on apixaban. Chadsvasc score of at least 3.  Patient had TEE again in 6/15, showing EF 35-40% with diffuse hypokinesis and no LAA thrombus. She was cardioverted to NSR (with difficulty). She was started  on amiodarone, however she returned to atrial fibrillation. After she had been loaded on amiodarone, she was cardioverted  again in 7/15. She went back into NSR easily  but returned back to afib in a matter of days. She cannot tell when she went back into atrial fibrillation, she is asymptomatic in it.. Weight is down per patient about 30 pounds since March of this year. She is able to walk on flat ground without dyspnea.Denies smoking history, alcohol use and snoring.  Clinic visit today reveals pt in [place]. She is feeling will without any issues with irregular rhythm. Has noticed much less dyspnea. She continues on amiodarone 200 mg qd.   [facility]. She will talk to the sponsoring person who set up the class to see if it can be continued. Her weight id down 3 lbs.  F/u in afib clinic 11/20. Pt was referred from her PCP after being seen for a physical and noted an irregular heart beat. Pt today in SR and did not feel that she was in afib that day. Usually will feel more short winded and fatigued.  She states that since she has been on amiodarone she has not noticed any irregular heart beat. She was seen by Dr. Rayann Heman in the past and was felt that she was not an ablation candidate due to her weight and was advised that if she could get below 300 lbs, she may be a candidate. She continues to struggle with her weight. Tsh and liver panel both just checked by PCP and within normal limits  F/u in afib clinic 5/29. She feels well but unfortunately is in afib today but unaware and asymptomatic. She was seen by Dr. Aundra Dubin last week and was in SR and labs with normal thyroid and liver panel. No change in meds or condition. Being compliant with meds.  Today, she denies symptoms of palpitations, chest pain, shortness of breath, orthopnea, PND, lower extremity edema, dizziness, presyncope, syncope, or neurologic sequela. The patient is tolerating medications without difficulties and is otherwise without complaint today.    Past Medical History:  Diagnosis Date  . Aortic stenosis   . Arthritis    KNEES  . Atrial fibrillation ([place])   . Complication of anesthesia   . Diabetes ([place])   . HLD (hyperlipidemia)   . Left atrial thrombus   . Morbid obesity ([place])   . NICM (nonischemic cardiomyopathy) ([place])    EF 30%  . PONV (postoperative nausea and vomiting)    Past Surgical History:  Procedure Laterality Date  . CARDIOVERSION N/A 07/29/2013  Procedure: CARDIOVERSION;  Surgeon: [name], MD;  Location: [facility] ENDOSCOPY;  Service: Cardiovascular;  Laterality: N/A;  . CARDIOVERSION N/A 11/11/2013   Procedure: CARDIOVERSION;  Surgeon: [name], MD;  Location: [facility] ENDOSCOPY;  Service: Cardiovascular;  Laterality: N/A;  . CARDIOVERSION N/A 12/08/2013   Procedure: CARDIOVERSION;  Surgeon: [name], MD;  Location: [place];  Service: Cardiovascular;  Laterality: N/A;  . JOINT REPLACEMENT    . LEFT HEART CATHETERIZATION WITH CORONARY ANGIOGRAM N/A 08/01/2013   Procedure: LEFT HEART  CATHETERIZATION WITH CORONARY ANGIOGRAM;  Surgeon: Blane Ohara, MD;  Location: [facility] CATH LAB;  Service: Cardiovascular;  Laterality: N/A;  . TEE WITHOUT CARDIOVERSION N/A 07/29/2013   Procedure: TRANSESOPHAGEAL ECHOCARDIOGRAM (TEE);  Surgeon: [name], MD;  Location: [place];  Service: Cardiovascular;  Laterality: N/A;  . TEE WITHOUT CARDIOVERSION N/A 11/11/2013   Procedure: TRANSESOPHAGEAL ECHOCARDIOGRAM (TEE);  Surgeon: [name], MD;  Location: [facility] ENDOSCOPY;  Service: Cardiovascular;  Laterality: N/A;    Current Outpatient Prescriptions  Medication Sig Dispense Refill  . amiodarone (PACERONE) 200 MG tablet Take 1 tablet (200 mg total) by mouth daily. 90 tablet 3  . Cholecalciferol (VITAMIN D-3) 5000 UNITS TABS Take 5,000 Units by mouth daily.    [name] docusate sodium (COLACE) 100 MG capsule Take 100 mg by mouth at bedtime.     [name] ELIQUIS 5 MG TABS tablet TAKE 1 TABLET BY MOUTH TWICE A DAY 60 tablet 5  . Ferrous Sulfate (PX IRON) 27 MG TABS Take 1 tablet by mouth daily.    . furosemide (LASIX) 40 MG tablet TAKE 1 TABLET (40 MG TOTAL) BY MOUTH EVERY OTHER DAY. 15 tablet 4  . HYDROcodone-acetaminophen (NORCO/VICODIN) 5-325 MG per tablet Take 2 tablets by mouth every 4 (four) hours as needed. 16 tablet 0  . lisinopril (PRINIVIL,ZESTRIL) 10 MG tablet TAKE 1 TABLET (10 MG TOTAL) BY MOUTH 2 (TWO) TIMES DAILY. 60 tablet 6  . metoprolol succinate (TOPROL-XL) 50 MG 24 hr tablet Take 75mg  (1.5 tablets) by mouth twice daily. 270 tablet 3  . nitroGLYCERIN (NITROSTAT) 0.4 MG SL tablet Place 0.4 mg under the tongue every 5 (five) minutes as needed for chest pain.    . Omega-3 Fatty Acids (FISH OIL) 600 MG CAPS Take 600 Units by mouth 2 (two) times daily.    . rosuvastatin (CRESTOR) 40 MG tablet Take 40 mg by mouth at bedtime.  6  . spironolactone (ALDACTONE) 25 MG tablet TAKE 1 TABLET (25 MG TOTAL) BY MOUTH DAILY. 90 tablet 1   No current facility-administered medications for this  encounter.     No Known Allergies  Social History   Social History  . Marital status: Single    Spouse name: N/A  . Number of children: N/A  . Years of education: N/A   Occupational History  . Not on file.   Social History Main Topics  . Smoking status: Never Smoker  . Smokeless tobacco: Never Used  . Alcohol use No  . Drug use: No  . Sexual activity: Not Currently   Other Topics Concern  . Not on file   Social History Narrative  . No narrative on file    Family History  Problem Relation Age of Onset  . CVA Mother     ROS- Systems negative except as per the HPI above  Physical Exam: BP with thigh cuff 120/78. Vitals:   10/14/16 1104  BP: 112/66  Pulse: (!) 109  Weight: (!) 345 lb (156.5  kg)  Height: 5\' 6"  (1.676 m)    GEN- The patient is well appearing, alert and oriented x 3 today.   Head- normocephalic, atraumatic Eyes-  Sclera clear, conjunctiva pink Ears- hearing intact Oropharynx- clear Neck- supple, no JVP Lymph- no cervical lymphadenopathy Lungs- Clear to ausculation bilaterally, normal work of breathing Heart- Regular rate and rhythm, no murmurs, rubs or gallops, PMI not laterally displaced GI- soft, NT, ND, + BS Extremities- no clubbing, cyanosis, or edema MS- no significant deformity or atrophy Skin- no rash or lesion Psych- euthymic mood, full affect Neuro- strength and sensation are intact  EKG- afib at 109 bpm, qrs int 84 ms, qtc 447 ms  Labs reviewed from 5/21    Assessment and Plan: 1. Persistent Afib-  Appears to be maintaining  SR, except pt found in be in asymptomatic afib this am. Continue  amiodarone 200 mg a day. Liver, thyroid recently checked  and wnl.  2.NICM Stable Continue ace, bb, diuretic,spironolactone.  3. HTN Well managed today. No changes  4. Morbid obesity Encouraged continued weight lose and regular exercise.  5.CHADS2VA2Sc  Continue xarelto to reduce stroke risk No current issues with  bleeding.  6. Renal insufficiency  stable  F/u afib clinic for ekg in one week   Kimberly [name], [facility] [address] [place], [place] [address] [phone]

## 2016-10-21 ENCOUNTER — Ambulatory Visit (HOSPITAL_COMMUNITY)
Admission: RE | Admit: 2016-10-21 | Discharge: 2016-10-21 | Disposition: A | Discharge status: Home / Self Care | Payer: Medicare Other | Source: Ambulatory Visit | Attending: Nurse Practitioner | Admitting: Nurse Practitioner

## 2016-10-21 DIAGNOSIS — I4891 Unspecified atrial fibrillation: Secondary | ICD-10-CM | POA: Diagnosis present

## 2016-10-21 NOTE — Progress Notes (Addendum)
Pt in today for repeat EKG, HR 56.  EKG to be read by Roderic Palau, NP  Pt is in for re[peat EKG as she was in afib last visit with me. She is in Sinus brady at 56 bpm, pr int 162 ms, qrs int 86 ms, qtc 393 ms. She feels well.

## 2016-12-26 ENCOUNTER — Telehealth (HOSPITAL_COMMUNITY): Payer: Self-pay

## 2016-12-26 NOTE — Telephone Encounter (Signed)
No antibiotic.  May hold Eliquis 2 days prior to procedure, restart the day after.

## 2016-12-26 NOTE — Telephone Encounter (Signed)
Received call from Dr. Windell Moulding office (dentist) to clarify restrictions/protocol on holding eliquis before and after tooth extraction and if patient needs antibiotic. Will forward to Dr. Aundra Dubin to advise.  Renee Pain, RN

## 2016-12-29 NOTE — Telephone Encounter (Signed)
Note faxed to [facility] at Dr. Windell Moulding dental office for clearance and recommendations. Theophilus Kinds made aware of fax being sent.  Renee Pain, RN

## 2017-01-09 ENCOUNTER — Other Ambulatory Visit (HOSPITAL_COMMUNITY): Payer: Self-pay | Admitting: *Deleted

## 2017-01-09 MED ORDER — LISINOPRIL 10 MG PO TABS: 10.0000 mg | ORAL_TABLET | Freq: Two times a day (BID) | ORAL | 0 refills | Status: DC

## 2017-01-09 MED ORDER — METOPROLOL SUCCINATE ER 50 MG PO TB24: ORAL_TABLET | ORAL | 3 refills | Status: DC

## 2017-01-09 MED ORDER — FUROSEMIDE 40 MG PO TABS: 40.0000 mg | ORAL_TABLET | ORAL | 4 refills | Status: DC

## 2017-01-09 NOTE — Addendum Note (Signed)
Addended by: Kerry Dory on: 01/09/2017 03:52 PM   Modules accepted: Orders

## 2017-01-09 NOTE — Addendum Note (Signed)
Addended by: Kerry Dory on: 01/09/2017 03:40 PM   Modules accepted: Orders

## 2017-01-13 ENCOUNTER — Encounter (HOSPITAL_COMMUNITY): Payer: Self-pay | Admitting: Nurse Practitioner

## 2017-01-13 ENCOUNTER — Ambulatory Visit (HOSPITAL_COMMUNITY)
Admission: RE | Admit: 2017-01-13 | Discharge: 2017-01-13 | Disposition: A | Discharge status: Home / Self Care | Payer: Medicare HMO | Source: Ambulatory Visit | Attending: Nurse Practitioner | Admitting: Nurse Practitioner

## 2017-01-13 VITALS — BP 122/64 | HR 53 | Ht 66.0 in | Wt 344.2 lb

## 2017-01-13 DIAGNOSIS — I350 Nonrheumatic aortic (valve) stenosis: Secondary | ICD-10-CM | POA: Insufficient documentation

## 2017-01-13 DIAGNOSIS — Z823 Family history of stroke: Secondary | ICD-10-CM | POA: Diagnosis not present

## 2017-01-13 DIAGNOSIS — E785 Hyperlipidemia, unspecified: Secondary | ICD-10-CM | POA: Insufficient documentation

## 2017-01-13 DIAGNOSIS — I10 Essential (primary) hypertension: Secondary | ICD-10-CM | POA: Diagnosis not present

## 2017-01-13 DIAGNOSIS — I429 Cardiomyopathy, unspecified: Secondary | ICD-10-CM | POA: Diagnosis not present

## 2017-01-13 DIAGNOSIS — Z7901 Long term (current) use of anticoagulants: Secondary | ICD-10-CM | POA: Diagnosis not present

## 2017-01-13 DIAGNOSIS — N289 Disorder of kidney and ureter, unspecified: Secondary | ICD-10-CM | POA: Insufficient documentation

## 2017-01-13 DIAGNOSIS — I480 Paroxysmal atrial fibrillation: Secondary | ICD-10-CM | POA: Diagnosis not present

## 2017-01-13 DIAGNOSIS — I481 Persistent atrial fibrillation: Secondary | ICD-10-CM | POA: Insufficient documentation

## 2017-01-13 DIAGNOSIS — E119 Type 2 diabetes mellitus without complications: Secondary | ICD-10-CM | POA: Insufficient documentation

## 2017-01-13 MED ORDER — AMIODARONE HCL 200 MG PO TABS: 200.0000 mg | ORAL_TABLET | Freq: Every day | ORAL | 3 refills | Status: DC

## 2017-01-13 NOTE — Progress Notes (Signed)
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 TEE was done in preparation for DCCV, showing EF 25-30% but there was LA thrombus so DCCV was not done. Plan was for anticoagulation x 3 months followed by TEE-guided DCCV. LHC was also done, showing mild nonobstructive CAD. Patient was diuresed with IV Lasix in the hospital and started on apixaban. Chadsvasc score of at least 3.  Patient had TEE again in 6/15, showing EF 35-40% with diffuse hypokinesis and no LAA thrombus. She was cardioverted to NSR (with difficulty). She was started  on amiodarone, however she returned to atrial fibrillation. After she had been loaded on amiodarone, she was cardioverted  again in 7/15. She went back into NSR easily  but returned back to afib in a matter of days. She cannot tell when she went back into atrial fibrillation, she is asymptomatic in it.. Weight is down per patient about 30 pounds since March of this year. She is able to walk on flat ground without dyspnea.Denies smoking history, alcohol use and snoring.  Clinic visit today reveals pt in [place]. She is feeling will without any issues with irregular rhythm. Has noticed much less dyspnea. She continues on amiodarone 200 mg qd.   [facility] to [facility]. She will talk to the sponsoring person who set up the class to see if it can be continued. Her weight id down 3 lbs.  F/u in afib clinic 11/20. Pt was referred from her PCP after being seen for a physical and noted an irregular heart beat. Pt today in SR and did not feel that she was in afib that day. Usually will feel more short winded and fatigued.  She states that since she has been on amiodarone she has not noticed any irregular heart beat. She was seen by Dr. Rayann Heman in the past and was felt that she was not an ablation candidate due to her weight and was advised that if she could get below 300 lbs, she may be a candidate. She continues to struggle with her weight. Tsh and liver panel both just checked by PCP and within normal limits  F/u in afib clinic 5/29. She feels well but unfortunately is in afib today but unaware and asymptomatic. She was seen by Dr. Aundra Dubin last week and was in SR and labs with normal thyroid and liver panel. No change in meds or condition. Being compliant with meds.  F/u in the afib clinic. She reports that she has been in SR as far she can tell. No issues with amiodarone. Labs recently checked in May and TSH and liver were WNL.Has a bad knee which limits her mobility. Struggles with weight.  Today, she denies symptoms of palpitations, chest pain, shortness of breath, orthopnea, PND, lower extremity edema, dizziness, presyncope, syncope, or neurologic sequela. The patient is tolerating medications without difficulties and is otherwise without complaint today.    Past Medical History:  Diagnosis Date  . Aortic stenosis   . Arthritis    KNEES  . Atrial fibrillation ([place])   . Complication of anesthesia   . Diabetes ([place])   . HLD (hyperlipidemia)   .  Left atrial thrombus   . Morbid obesity ([place])   . NICM (nonischemic cardiomyopathy) ([place])    EF 30%  . PONV (postoperative nausea and vomiting)    Past Surgical History:  Procedure Laterality Date  . CARDIOVERSION N/A 07/29/2013   Procedure: CARDIOVERSION;  Surgeon: [name], MD;  Location: [facility] ENDOSCOPY;  Service: Cardiovascular;  Laterality: N/A;  . CARDIOVERSION N/A 11/11/2013   Procedure: CARDIOVERSION;  Surgeon: [name], MD;  Location: [facility] ENDOSCOPY;  Service: Cardiovascular;  Laterality: N/A;  . CARDIOVERSION N/A 12/08/2013   Procedure:  CARDIOVERSION;  Surgeon: [name], MD;  Location: [place];  Service: Cardiovascular;  Laterality: N/A;  . JOINT REPLACEMENT    . LEFT HEART CATHETERIZATION WITH CORONARY ANGIOGRAM N/A 08/01/2013   Procedure: LEFT HEART CATHETERIZATION WITH CORONARY ANGIOGRAM;  Surgeon: Blane Ohara, MD;  Location: [facility] CATH LAB;  Service: Cardiovascular;  Laterality: N/A;  . TEE WITHOUT CARDIOVERSION N/A 07/29/2013   Procedure: TRANSESOPHAGEAL ECHOCARDIOGRAM (TEE);  Surgeon: [name], MD;  Location: [place];  Service: Cardiovascular;  Laterality: N/A;  . TEE WITHOUT CARDIOVERSION N/A 11/11/2013   Procedure: TRANSESOPHAGEAL ECHOCARDIOGRAM (TEE);  Surgeon: [name], MD;  Location: [facility] ENDOSCOPY;  Service: Cardiovascular;  Laterality: N/A;    Current Outpatient Prescriptions  Medication Sig Dispense Refill  . amiodarone (PACERONE) 200 MG tablet Take 1 tablet (200 mg total) by mouth daily. 90 tablet 3  . Cholecalciferol (VITAMIN D-3) 5000 UNITS TABS Take 5,000 Units by mouth daily.    [name] docusate sodium (COLACE) 100 MG capsule Take 100 mg by mouth at bedtime.     [name] ELIQUIS 5 MG TABS tablet TAKE 1 TABLET BY MOUTH TWICE A DAY 60 tablet 5  . Ferrous Sulfate (PX IRON) 27 MG TABS Take 1 tablet by mouth daily.    . furosemide (LASIX) 40 MG tablet Take 1 tablet (40 mg total) by mouth every other day. 15 tablet 4  . HYDROcodone-acetaminophen (NORCO/VICODIN) 5-325 MG per tablet Take 2 tablets by mouth every 4 (four) hours as needed. 16 tablet 0  . lisinopril (PRINIVIL,ZESTRIL) 10 MG tablet Take 1 tablet (10 mg total) by mouth 2 (two) times daily. Needs office visit 60 tablet 0  . metoprolol succinate (TOPROL-XL) 50 MG 24 hr tablet Take 75mg  (1.5 tablets) by mouth twice daily. 270 tablet 3  . nitroGLYCERIN (NITROSTAT) 0.4 MG SL tablet Place 0.4 mg under the tongue every 5 (five) minutes as needed for chest pain.    . Omega-3 Fatty Acids (FISH OIL) 600 MG CAPS Take 600 Units by mouth 2 (two)  times daily.    . rosuvastatin (CRESTOR) 40 MG tablet Take 40 mg by mouth at bedtime.  6  . spironolactone (ALDACTONE) 25 MG tablet TAKE 1 TABLET (25 MG TOTAL) BY MOUTH DAILY. 90 tablet 1   No current facility-administered medications for this encounter.     No Known Allergies  Social History   Social History  . Marital status: Single    Spouse name: N/A  . Number of children: N/A  . Years of education: N/A   Occupational History  . Not on file.   Social History Main Topics  . Smoking status: Never Smoker  . Smokeless tobacco: Never Used  . Alcohol use No  . Drug use: No  . Sexual activity: Not Currently   Other Topics Concern  . Not on file   Social History Narrative  . No narrative on file    Family History  Problem Relation Age of Onset  . CVA Mother     ROS- Systems negative except as per the HPI above  Physical Exam: BP with thigh cuff 120/78. Vitals:   01/13/17 1029  BP: 122/64  Pulse: (!) 53  Weight: (!) 344 lb 3.2 oz (156.1 kg)  Height: 5\' 6"  (1.676 m)    GEN- The patient is well appearing, alert and oriented x 3 today.   Head- normocephalic, atraumatic Eyes-  Sclera clear, conjunctiva pink Ears- hearing intact Oropharynx- clear Neck- supple, no JVP Lymph- no cervical lymphadenopathy Lungs- Clear to ausculation bilaterally, normal work of breathing Heart- Regular rate and rhythm, no murmurs, rubs or gallops, PMI not laterally displaced GI- soft, NT, ND, + BS Extremities- no clubbing, cyanosis, or edema MS- no significant deformity or atrophy Skin- no rash or lesion Psych- euthymic mood, full affect Neuro- strength and sensation are intact  EKG- Sinus brady at 53 bpm, pr int 176 ms, qrs int 90 ms, qtc 399 ms Labs reviewed from 10/06/16    Assessment and Plan: 1. Persistent Afib-  Appears to be maintaining SR Continue  amiodarone 200 mg a day. Liver, thyroid recently checked  and wnl.  2.NICM Stable Continue ace, bb,  diuretic,spironolactone.  3. HTN Well managed today. No changes  4. Morbid obesity Encouraged continued weight lose and regular exercise.  5.CHADS2VA2Sc of at least 3 Continue xarelto to reduce stroke risk No current issues with bleeding.  6. Renal insufficiency  stable  F/u afib clinic as needed Dr. Aundra Dubin in HF clinic every 6 months   Geroge Baseman. Tyran Huser, [facility] [address] [place], [place] [address] [phone]

## 2017-01-14 ENCOUNTER — Telehealth (HOSPITAL_COMMUNITY): Payer: Self-pay | Admitting: Pharmacist

## 2017-01-14 ENCOUNTER — Other Ambulatory Visit (HOSPITAL_COMMUNITY): Payer: Self-pay | Admitting: Cardiology

## 2017-01-14 DIAGNOSIS — I4891 Unspecified atrial fibrillation: Secondary | ICD-10-CM

## 2017-01-14 MED ORDER — APIXABAN 5 MG PO TABS: 5.0000 mg | ORAL_TABLET | Freq: Two times a day (BID) | ORAL | 2 refills | Status: DC

## 2017-01-14 NOTE — Telephone Encounter (Signed)
Metoprolol succinate 75 mg BID PA approved by [employer] Part D through 01/14/18.   Ruta Hinds. Velva Harman, PharmD, BCPS, CPP Clinical Pharmacist Pager: [phone] Phone: [phone] 01/14/2017 3:52 PM

## 2017-01-21 ENCOUNTER — Other Ambulatory Visit (HOSPITAL_COMMUNITY): Payer: Self-pay | Admitting: Cardiology

## 2017-01-21 MED ORDER — FUROSEMIDE 40 MG PO TABS: 40.0000 mg | ORAL_TABLET | ORAL | 4 refills | Status: DC

## 2017-01-21 MED ORDER — METOPROLOL SUCCINATE ER 50 MG PO TB24: ORAL_TABLET | ORAL | 3 refills | Status: DC

## 2017-02-17 ENCOUNTER — Other Ambulatory Visit: Payer: Self-pay | Admitting: Internal Medicine

## 2017-03-23 ENCOUNTER — Other Ambulatory Visit (HOSPITAL_COMMUNITY): Payer: Self-pay | Admitting: Adult Health

## 2017-04-24 ENCOUNTER — Other Ambulatory Visit (HOSPITAL_COMMUNITY): Payer: Self-pay | Admitting: Cardiology

## 2017-06-10 ENCOUNTER — Other Ambulatory Visit: Payer: Self-pay | Admitting: Internal Medicine

## 2017-08-04 ENCOUNTER — Other Ambulatory Visit (HOSPITAL_COMMUNITY): Payer: Self-pay | Admitting: *Deleted

## 2017-08-04 DIAGNOSIS — I4891 Unspecified atrial fibrillation: Secondary | ICD-10-CM

## 2017-08-04 MED ORDER — SPIRONOLACTONE 25 MG PO TABS: 25.0000 mg | ORAL_TABLET | Freq: Every day | ORAL | 0 refills | Status: DC

## 2017-08-08 ENCOUNTER — Other Ambulatory Visit (HOSPITAL_COMMUNITY): Payer: Self-pay | Admitting: Cardiology

## 2017-08-08 DIAGNOSIS — I4891 Unspecified atrial fibrillation: Secondary | ICD-10-CM

## 2017-08-17 ENCOUNTER — Other Ambulatory Visit (HOSPITAL_COMMUNITY): Payer: Self-pay | Admitting: Cardiology

## 2017-08-18 ENCOUNTER — Ambulatory Visit (HOSPITAL_COMMUNITY)
Admission: RE | Admit: 2017-08-18 | Discharge: 2017-08-18 | Disposition: A | Discharge status: Home / Self Care | Payer: Medicare HMO | Source: Ambulatory Visit | Attending: Cardiology | Admitting: Cardiology

## 2017-08-18 VITALS — BP 118/72 | HR 53 | Wt 360.0 lb

## 2017-08-18 DIAGNOSIS — E785 Hyperlipidemia, unspecified: Secondary | ICD-10-CM | POA: Diagnosis not present

## 2017-08-18 DIAGNOSIS — M1990 Unspecified osteoarthritis, unspecified site: Secondary | ICD-10-CM | POA: Diagnosis not present

## 2017-08-18 DIAGNOSIS — Z7901 Long term (current) use of anticoagulants: Secondary | ICD-10-CM | POA: Diagnosis not present

## 2017-08-18 DIAGNOSIS — Z6841 Body Mass Index (BMI) 40.0 and over, adult: Secondary | ICD-10-CM | POA: Insufficient documentation

## 2017-08-18 DIAGNOSIS — R001 Bradycardia, unspecified: Secondary | ICD-10-CM | POA: Diagnosis not present

## 2017-08-18 DIAGNOSIS — I350 Nonrheumatic aortic (valve) stenosis: Secondary | ICD-10-CM | POA: Diagnosis not present

## 2017-08-18 DIAGNOSIS — R200 Anesthesia of skin: Secondary | ICD-10-CM | POA: Diagnosis not present

## 2017-08-18 DIAGNOSIS — R202 Paresthesia of skin: Secondary | ICD-10-CM

## 2017-08-18 DIAGNOSIS — I5022 Chronic systolic (congestive) heart failure: Secondary | ICD-10-CM | POA: Diagnosis present

## 2017-08-18 DIAGNOSIS — E1169 Type 2 diabetes mellitus with other specified complication: Secondary | ICD-10-CM

## 2017-08-18 DIAGNOSIS — I480 Paroxysmal atrial fibrillation: Secondary | ICD-10-CM | POA: Diagnosis not present

## 2017-08-18 DIAGNOSIS — Z79891 Long term (current) use of opiate analgesic: Secondary | ICD-10-CM | POA: Diagnosis not present

## 2017-08-18 DIAGNOSIS — I2510 Atherosclerotic heart disease of native coronary artery without angina pectoris: Secondary | ICD-10-CM | POA: Diagnosis not present

## 2017-08-18 DIAGNOSIS — G629 Polyneuropathy, unspecified: Secondary | ICD-10-CM | POA: Insufficient documentation

## 2017-08-18 DIAGNOSIS — B079 Viral wart, unspecified: Secondary | ICD-10-CM | POA: Diagnosis not present

## 2017-08-18 DIAGNOSIS — Z79899 Other long term (current) drug therapy: Secondary | ICD-10-CM | POA: Insufficient documentation

## 2017-08-18 DIAGNOSIS — G5603 Carpal tunnel syndrome, bilateral upper limbs: Secondary | ICD-10-CM | POA: Insufficient documentation

## 2017-08-18 DIAGNOSIS — E669 Obesity, unspecified: Secondary | ICD-10-CM | POA: Diagnosis not present

## 2017-08-18 DIAGNOSIS — I429 Cardiomyopathy, unspecified: Secondary | ICD-10-CM | POA: Diagnosis not present

## 2017-08-18 LAB — COMPREHENSIVE METABOLIC PANEL
ALT: 15 U/L (ref 14–54)
AST: 18 U/L (ref 15–41)
Albumin: 4 g/dL (ref 3.5–5.0)
Alkaline Phosphatase: 76 U/L (ref 38–126)
Anion gap: 11 (ref 5–15)
BUN: 16 mg/dL (ref 6–20)
CO2: 26 mmol/L (ref 22–32)
Calcium: 9.7 mg/dL (ref 8.9–10.3)
Chloride: 104 mmol/L (ref 101–111)
Creatinine, Ser: 1.24 mg/dL — ABNORMAL HIGH (ref 0.44–1.00)
GFR calc Af Amer: 54 mL/min — ABNORMAL LOW (ref >60)
GFR calc non Af Amer: 46 mL/min — ABNORMAL LOW (ref >60)
Glucose, Bld: 93 mg/dL (ref 65–99)
Potassium: 4 mmol/L (ref 3.5–5.1)
Sodium: 141 mmol/L (ref 135–145)
Total Bilirubin: 0.8 mg/dL (ref 0.3–1.2)
Total Protein: 7.4 g/dL (ref 6.5–8.1)

## 2017-08-18 LAB — TSH: TSH: 1.119 u[IU]/mL (ref 0.350–4.500)

## 2017-08-18 LAB — T4, FREE: Free T4: 1.4 ng/dL — ABNORMAL HIGH (ref 0.61–1.12)

## 2017-08-18 MED ORDER — FUROSEMIDE 40 MG PO TABS: 40.0000 mg | ORAL_TABLET | Freq: Every day | ORAL | 3 refills | Status: DC

## 2017-08-18 NOTE — Patient Instructions (Signed)
Continue Lasix 40 mg once every morning. Take extra tablet AS NEEDED for weight gain 3 lbs or more overnight, or 5 lbs or more in 1 week.  Routine lab work today. Will notify you of abnormal results, otherwise no news is good news!  Will schedule PYP cardiac scan at [place] in at main entrance (Entrance A) on [address]. Free valet parking available at this location. No prep required for this study.  Follow up 4 weeks with Oda Kilts PA-C.  Follow up 2 months with Dr. Aundra Dubin with echocardiogram.  Take all medication as prescribed the day of your appointment. Bring all medications with you to your appointment.  Do the following things EVERYDAY: 1) Weigh yourself in the morning before breakfast. Write it down and keep it in a log. 2) Take your medicines as prescribed 3) Eat low salt foods-Limit salt (sodium) to 2000 mg per day.  4) Stay as active as you can everyday 5) Limit all fluids for the day to less than 2 liters

## 2017-08-18 NOTE — Addendum Note (Signed)
Encounter addended by: Effie Berkshire, RN on: 08/18/2017 3:16 PM  Actions taken: Visit diagnoses modified, Order list changed, Diagnosis association updated

## 2017-08-18 NOTE — Progress Notes (Addendum)
Patient ID: Kimberly [name], female   DOB: Nov 15, 1955, 62 y.o.   MRN: [id number] PCP: Dr. Gery Pray [facility]) Cardiology: Dr. Mila Homer is a 62 y.o. female with history of paroxysmal atrial fibrillation, obesity, and nonischemic cardiomyopathy presents for cardiology followup.  Patient was admitted in 3/15 at [facility] with atrial fibrillation and CHF.  TEE was done in preparation for DCCV, showing EF 25-30% but there was LA thrombus so DCCV was not done.  Plan was for anticoagulation x 3 months followed by TEE-guided DCCV in the OR (due to patient's body habitus).  LHC was also done, showing mild nonobstructive CAD.  Patient was diuresed with IV Lasix in the hospital and started on apixaban.   Patient had TEE again in 6/15, showing EF 35-40% with diffuse hypokinesis and no LAA thrombus.  She was cardioverted to NSR (with difficulty).  I started her at that time on amiodarone.  She then went back into atrial fibrillation.  After she had been loaded on amiodarone, cardioverted her again in 7/15 to NSR.  She has been back and forth between NSR and atrial fibrillation since that time.    She had gone back into atrial fibrillation in 1/18 and amiodarone was increased to bid in preparation for DCCV. However, she spontaneously converted to NSR.   She presents today for regular follow up. Last seen in clinic 09/2016.  She has been doing well overall since then. Of note, her weight is up 16 lbs. She does not weight daily, so is unclear how long this has been building up.  She uses a cane due to bilateral knee pain. Over the past two months, she has had parasthesias in both hands, in a median nerve distribution that is worse at night. She has not seen her PCP about this. 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 She denies PND/Orthopnea. She denies BRBPR or melena. Taking all medication as directed.   ECG: Sinus bradycardia 56 bpm, personally reviewed.   PMH: 1. Obese 2. Atrial  fibrillation: Paroxysmal.  Unable to cardiovert in 3/15 due to LAA thrombus noted on TEE. TEE in 6/15 without LAA thrombus.  Patient had cardioversion to NSR with difficulty but back in atrial fibrillation by 7/15 appt.  She was started on amiodarone and cardioverted in 7/15.  Has been back and forth between AF and NSR since then.  3. H/o TKR 4. Nonischemic cardiomyopathy: TEE (3/15) with EF 25-30%, moderate MR, PA systolic pressure 51 mmHg, moderate to severe TR, LA thrombus was noted.   LHC (3/15) with 50% stenosis small PLV.  TEE (6/15) with EF 35-40%, diffuse hypokinesis, mild MR, moderate TR, +PFO, no LA appendage thrombus.  - Echo (10/15) with EF 60-65%.   - ECHO 06/10/2016 EF 60-65% Mild AS 5. CAD: Nonobstructive.  LHC (3/15) with 50% stenosis small PLV. 6. PFO 7. Left Breast mass: Was taken for lumpectomy in 1/18 but mass had resolved.  8. Aortic stenosis: Mild.   SH: Married, lives in [place], nonsmoker  FH: No premature CAD or cardiomyopathy.  Review of systems complete and found to be negative unless listed in HPI.    Current Outpatient Medications  Medication Sig Dispense Refill  . acetaminophen (TYLENOL) 650 MG CR tablet Take 650 mg by mouth every 8 (eight) hours as needed for pain.    [name] amiodarone (PACERONE) 200 MG tablet TAKE 1 TABLET (200 MG TOTAL) BY MOUTH 2 (TWO) TIMES DAILY. 180 tablet 3  .  Cholecalciferol (VITAMIN D-3) 5000 UNITS TABS Take 5,000 Units by mouth daily.    [name] docusate sodium (COLACE) 100 MG capsule Take 100 mg by mouth at bedtime.     [name] ELIQUIS 5 MG TABS tablet TAKE 1 TABLET BY MOUTH TWICE A DAY 60 tablet 5  . Ferrous Sulfate (PX IRON) 27 MG TABS Take 1 tablet by mouth daily.    . furosemide (LASIX) 40 MG tablet Take 40 mg by mouth daily.    [name] HYDROcodone-acetaminophen (NORCO/VICODIN) 5-325 MG per tablet Take 2 tablets by mouth every 4 (four) hours as needed. 16 tablet 0  . lisinopril (PRINIVIL,ZESTRIL) 10 MG tablet TAKE 1 TABLET (10 MG TOTAL) 2 (TWO)  TIMES DAILY BY MOUTH. NEEDS OFFICE VISIT 60 tablet 2  . metoprolol succinate (TOPROL-XL) 50 MG 24 hr tablet Take 75mg  (1.5 tablets) by mouth twice daily. 270 tablet 3  . nitroGLYCERIN (NITROSTAT) 0.4 MG SL tablet Place 0.4 mg under the tongue every 5 (five) minutes as needed for chest pain.    . Omega-3 Fatty Acids (FISH OIL) 600 MG CAPS Take 600 Units by mouth 2 (two) times daily.    . rosuvastatin (CRESTOR) 40 MG tablet Take 40 mg by mouth at bedtime.  6  . spironolactone (ALDACTONE) 25 MG tablet Take 1 tablet (25 mg total) by mouth daily. Needs OV for more refills 90 tablet 0   No current facility-administered medications for this encounter.    Vitals:   08/18/17 1409  BP: 118/72  Pulse: (!) 53  SpO2: 98%  Weight: (!) 360 lb (163.3 kg)    Wt Readings from Last 3 Encounters:  08/18/17 (!) 360 lb (163.3 kg)  01/13/17 (!) 344 lb 3.2 oz (156.1 kg)  10/14/16 (!) 345 lb (156.5 kg)   Physical Exam General: Well appearing. No resp difficulty. HEENT: Normal Neck: Supple. JVP 5-6. Carotids 2+ bilat; no bruits. No thyromegaly or nodule noted. Cor: PMI nondisplaced. RRR, 1/6 SEM RUSB.  Lungs: CTAB, normal effort. Abdomen: Soft, non-tender, non-distended, no HSM. No bruits or masses. +BS  Extremities: No cyanosis, clubbing, or rash. R and LLE no edema.  Neuro: Alert & orientedx3, cranial nerves grossly intact. moves all 4 extremities w/o difficulty. Affect pleasant   Assessment/Plan: 1. Chronic systolic CHF: Nonischemic cardiomyopathy.  EF 35-40% on TEE in 6/15 but then EF back to 60-65% by 10/15 and remained normal on 1/18 echo.  LHC without significant coronary disease in 3/15.  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  - NYHA II-III symptoms. Confounded by arthritis - Volume status mildly elevated on exam - She states she has been taking lasix 40 mg every other day. Would take 80 mg tomorrow then continue.  - Continue lisinopril 10 mg BID -  Continue spiro 25 mg daily - Continue Toprol 75 mg BID - Reinforced fluid restriction to < 2 L daily, sodium restriction to less than 2000 mg daily, and the importance of daily weights.   - With HFpEF, neuropathy, conduction disorder (Afib), and NICM we are concerned for infiltrative cardiomyopathy. She is too large for cMRI, so will order Tc-PYP scan.  - Will send myeloma panel for further work up of NICM.   2. Atrial fibrillation:  Paroxysmal.   - EKG today shows Sinus brady.  - Continue eliquis. Denies bleeding.  - Continue amiodarone 200 mg daily. LFTs and TSH today. She has yearly eye exam this week.  3. CAD: - Mild, nonobstructive by cath 07/2013 - No s/s of ischemia.    -  No ASA given Eliquis use.   4. Hyperlipidemia:  - Good lipids 1/18.  - Continue current dose of crestor.   5. Morbid Obesity:  - Body mass index is 58.11 kg/m.  - Encouraged weight loss.   6. Bilateral Carpal Tunnel symptoms - This can be suggestive of infiltrative cardiomyopathy, although less sensitive in patients. New over past 2 months.  - She follows up with PCP soon.   Meds and labs as above. Will work up for possible infiltrative cardiomyopathy. RTC 4 weeks.  Satira Mccallum [facility] 08/18/2017  Greater than 50% of the 30 minute visit was spent in counseling/coordination of care regarding disease state education, salt/fluid restriction, sliding scale diuretics, and medication compliance.

## 2017-08-19 LAB — T3, FREE: T3, Free: 2.7 pg/mL (ref 2.0–4.4)

## 2017-08-19 NOTE — Addendum Note (Signed)
Encounter addended by: Shirley Friar, PA-C on: 08/19/2017 9:48 AM  Actions taken: Sign clinical note

## 2017-08-20 LAB — MULTIPLE MYELOMA PANEL, SERUM
Albumin SerPl Elph-Mcnc: 3.9 g/dL (ref 2.9–4.4)
Albumin/Glob SerPl: 1.2 (ref 0.7–1.7)
Alpha 1: 0.2 g/dL (ref 0.0–0.4)
Alpha2 Glob SerPl Elph-Mcnc: 0.9 g/dL (ref 0.4–1.0)
B-Globulin SerPl Elph-Mcnc: 1.3 g/dL (ref 0.7–1.3)
Gamma Glob SerPl Elph-Mcnc: 1 g/dL (ref 0.4–1.8)
Globulin, Total: 3.4 g/dL (ref 2.2–3.9)
IgA: 210 mg/dL (ref 87–352)
IgG (Immunoglobin G), Serum: 1067 mg/dL (ref 700–1600)
IgM (Immunoglobulin M), Srm: 76 mg/dL (ref 26–217)
Total Protein ELP: 7.3 g/dL (ref 6.0–8.5)

## 2017-08-28 ENCOUNTER — Ambulatory Visit (HOSPITAL_COMMUNITY): Payer: Medicare HMO

## 2017-08-28 ENCOUNTER — Encounter (HOSPITAL_COMMUNITY): Payer: Medicare HMO

## 2017-09-01 ENCOUNTER — Encounter (HOSPITAL_COMMUNITY): Payer: Medicare HMO

## 2017-09-01 ENCOUNTER — Encounter (HOSPITAL_COMMUNITY)
Admission: RE | Admit: 2017-09-01 | Discharge: 2017-09-01 | Disposition: A | Discharge status: Home / Self Care | Payer: Medicare HMO | Source: Ambulatory Visit | Attending: Student | Admitting: Student

## 2017-09-01 DIAGNOSIS — E859 Amyloidosis, unspecified: Secondary | ICD-10-CM | POA: Insufficient documentation

## 2017-09-01 DIAGNOSIS — C9110 Chronic lymphocytic leukemia of B-cell type not having achieved remission: Secondary | ICD-10-CM | POA: Diagnosis present

## 2017-09-01 MED ORDER — TECHNETIUM TC 99M PYROPHOSPHATE
20.0000 | Freq: Once | INTRAVENOUS | Status: AC
  Administered 2017-09-01: 20 via INTRAVENOUS
  Filled 2017-09-01: qty 20

## 2017-09-15 ENCOUNTER — Ambulatory Visit (HOSPITAL_COMMUNITY)
Admission: RE | Admit: 2017-09-15 | Discharge: 2017-09-15 | Disposition: A | Discharge status: Home / Self Care | Payer: Medicare HMO | Source: Ambulatory Visit | Attending: Cardiology | Admitting: Cardiology

## 2017-09-15 ENCOUNTER — Other Ambulatory Visit (HOSPITAL_COMMUNITY): Payer: Self-pay | Admitting: *Deleted

## 2017-09-15 ENCOUNTER — Encounter (HOSPITAL_COMMUNITY): Payer: Self-pay

## 2017-09-15 VITALS — BP 112/70 | HR 64 | Wt 358.8 lb

## 2017-09-15 DIAGNOSIS — E854 Organ-limited amyloidosis: Secondary | ICD-10-CM | POA: Insufficient documentation

## 2017-09-15 DIAGNOSIS — I5022 Chronic systolic (congestive) heart failure: Secondary | ICD-10-CM | POA: Diagnosis not present

## 2017-09-15 DIAGNOSIS — E1169 Type 2 diabetes mellitus with other specified complication: Secondary | ICD-10-CM

## 2017-09-15 DIAGNOSIS — Z6841 Body Mass Index (BMI) 40.0 and over, adult: Secondary | ICD-10-CM | POA: Insufficient documentation

## 2017-09-15 DIAGNOSIS — I2510 Atherosclerotic heart disease of native coronary artery without angina pectoris: Secondary | ICD-10-CM | POA: Insufficient documentation

## 2017-09-15 DIAGNOSIS — I428 Other cardiomyopathies: Secondary | ICD-10-CM | POA: Insufficient documentation

## 2017-09-15 DIAGNOSIS — R0683 Snoring: Secondary | ICD-10-CM | POA: Diagnosis not present

## 2017-09-15 DIAGNOSIS — Z7901 Long term (current) use of anticoagulants: Secondary | ICD-10-CM | POA: Diagnosis not present

## 2017-09-15 DIAGNOSIS — I480 Paroxysmal atrial fibrillation: Secondary | ICD-10-CM

## 2017-09-15 DIAGNOSIS — I43 Cardiomyopathy in diseases classified elsewhere: Secondary | ICD-10-CM | POA: Diagnosis not present

## 2017-09-15 DIAGNOSIS — Z79899 Other long term (current) drug therapy: Secondary | ICD-10-CM | POA: Diagnosis not present

## 2017-09-15 DIAGNOSIS — I350 Nonrheumatic aortic (valve) stenosis: Secondary | ICD-10-CM | POA: Insufficient documentation

## 2017-09-15 DIAGNOSIS — E785 Hyperlipidemia, unspecified: Secondary | ICD-10-CM

## 2017-09-15 DIAGNOSIS — E669 Obesity, unspecified: Secondary | ICD-10-CM

## 2017-09-15 MED ORDER — NITROGLYCERIN 0.4 MG SL SUBL: 0.4000 mg | SUBLINGUAL_TABLET | SUBLINGUAL | 1 refills | Status: DC | PRN

## 2017-09-15 MED ORDER — METOPROLOL SUCCINATE ER 50 MG PO TB24: ORAL_TABLET | ORAL | 2 refills | Status: DC

## 2017-09-15 NOTE — Progress Notes (Signed)
Patient ID: Kimberly [name], female   DOB: 18-Jun-1955, 62 y.o.   MRN: [id number] PCP: Dr. Gery Pray [facility]) Cardiology: Dr. Mila Homer is a 62 y.o. female with history of paroxysmal atrial fibrillation, obesity, and nonischemic cardiomyopathy presents for cardiology followup.  Patient was admitted in 3/15 at [facility] with atrial fibrillation and CHF.  TEE was done in preparation for DCCV, showing EF 25-30% but there was LA thrombus so DCCV was not done.  Plan was for anticoagulation x 3 months followed by TEE-guided DCCV in the OR (due to patient's body habitus).  LHC was also done, showing mild nonobstructive CAD.  Patient was diuresed with IV Lasix in the hospital and started on apixaban.   Patient had TEE again in 6/15, showing EF 35-40% with diffuse hypokinesis and no LAA thrombus.  She was cardioverted to NSR (with difficulty).  I started her at that time on amiodarone.  She then went back into atrial fibrillation.  After she had been loaded on amiodarone, cardioverted her again in 7/15 to NSR.  She has been back and forth between NSR and atrial fibrillation since that time.    She had gone back into atrial fibrillation in 1/18 and amiodarone was increased to bid in preparation for DCCV. However, she spontaneously converted to NSR.  She presents today for regular follow up. At last visit lasix increased. PYP scan done which was + for cardiac amyloidosis. Overall, feeling well. Weight down 2 lbs. She walks with a cane due to arthritis. She denies PND or orthopnea. No BRBPR or melena. Taking all medications as directed. She is nervous about being told her PYP scan was positive. She denies SOB with ADLs or getting around the house.   PMH: 1. Obese 2. Atrial fibrillation: Paroxysmal.  Unable to cardiovert in 3/15 due to LAA thrombus noted on TEE. TEE in 6/15 without LAA thrombus.  Patient had cardioversion to NSR with difficulty but back in atrial fibrillation by 7/15 appt.  She was  started on amiodarone and cardioverted in 7/15.  Has been back and forth between AF and NSR since then.  3. H/o TKR 4. Nonischemic cardiomyopathy: TEE (3/15) with EF 25-30%, moderate MR, PA systolic pressure 51 mmHg, moderate to severe TR, LA thrombus was noted.   LHC (3/15) with 50% stenosis small PLV.  TEE (6/15) with EF 35-40%, diffuse hypokinesis, mild MR, moderate TR, +PFO, no LA appendage thrombus.  - Echo (10/15) with EF 60-65%.   - ECHO 06/10/2016 EF 60-65% Mild AS 5. CAD: Nonobstructive.  LHC (3/15) with 50% stenosis small PLV. 6. PFO 7. Left Breast mass: Was taken for lumpectomy in 1/18 but mass had resolved.  8. Aortic stenosis: Mild.   SH: Married, lives in [place], nonsmoker  FH: No premature CAD or cardiomyopathy.  Review of systems complete and found to be negative unless listed in HPI.    Current Outpatient Medications  Medication Sig Dispense Refill  . acetaminophen (TYLENOL) 650 MG CR tablet Take 650 mg by mouth every 8 (eight) hours as needed for pain.    [name] amiodarone (PACERONE) 200 MG tablet TAKE 1 TABLET (200 MG TOTAL) BY MOUTH 2 (TWO) TIMES DAILY. 180 tablet 3  . Cholecalciferol (VITAMIN D-3) 5000 UNITS TABS Take 5,000 Units by mouth daily.    [name] docusate sodium (COLACE) 100 MG capsule Take 100 mg by mouth at bedtime.     [name] ELIQUIS 5 MG TABS tablet TAKE 1 TABLET BY MOUTH TWICE A DAY 60 tablet  5  . Ferrous Sulfate (PX IRON) 27 MG TABS Take 1 tablet by mouth daily.    . furosemide (LASIX) 40 MG tablet Take 1 tablet (40 mg total) by mouth daily. Take extra tablet once daily AS NEEDED for weight gain 3 lbs overnight/5 lbs in 1 week. 90 tablet 3  . HYDROcodone-acetaminophen (NORCO/VICODIN) 5-325 MG per tablet Take 2 tablets by mouth every 4 (four) hours as needed. 16 tablet 0  . lisinopril (PRINIVIL,ZESTRIL) 10 MG tablet TAKE 1 TABLET (10 MG TOTAL) 2 (TWO) TIMES DAILY BY MOUTH. NEEDS OFFICE VISIT 60 tablet 2  . metoprolol succinate (TOPROL-XL) 50 MG 24 hr tablet Take 75mg   (1.5 tablets) by mouth twice daily. 270 tablet 3  . metoprolol succinate (TOPROL-XL) 50 MG 24 hr tablet TAKE 1 & 1/2 TABLETS BY MOUTH TWICE A DAY 270 tablet 2  . nitroGLYCERIN (NITROSTAT) 0.4 MG SL tablet Place 0.4 mg under the tongue every 5 (five) minutes as needed for chest pain.    . Omega-3 Fatty Acids (FISH OIL) 600 MG CAPS Take 600 Units by mouth 2 (two) times daily.    . rosuvastatin (CRESTOR) 40 MG tablet Take 40 mg by mouth at bedtime.  6  . spironolactone (ALDACTONE) 25 MG tablet Take 1 tablet (25 mg total) by mouth daily. Needs OV for more refills 90 tablet 0   No current facility-administered medications for this visit.    Vitals:   09/15/17 1419  BP: 112/70  Pulse: 64  SpO2: 98%  Weight: (!) 358 lb 12.8 oz (162.8 kg)    Wt Readings from Last 3 Encounters:  09/15/17 (!) 358 lb 12.8 oz (162.8 kg)  08/18/17 (!) 360 lb (163.3 kg)  01/13/17 (!) 344 lb 3.2 oz (156.1 kg)   Physical Exam General: Well appearing. No resp difficulty. HEENT: Normal Neck: Supple. JVP difficult due to body habitus.. Carotids 2+ bilat; no bruits. No thyromegaly or nodule noted. Cor: PMI nondisplaced. RRR, 1/6 SEM RUSB.  Lungs: CTAB, normal effort. Abdomen: Markedly Obese, soft, non-tender, non-distended, no HSM. No bruits or masses. +BS  Extremities: No cyanosis, clubbing, or rash. R and LLE no edema.  Neuro: Alert & orientedx3, cranial nerves grossly intact. moves all 4 extremities w/o difficulty. Affect pleasant   Assessment/Plan: 1. Chronic systolic CHF: Nonischemic cardiomyopathy.  EF 35-40% on TEE in 6/15 but then EF back to 60-65% by 10/15 and remained normal on 1/18 echo.  LHC without significant coronary disease in 3/15.  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  - NYHA III symptoms  - Volume status stable on exam.  - Continue lasix 80 mg daily.  - Continue lisinopril 10 mg BID - Continue spiro 25 mg daily - Continue Toprol 75 mg BID -  Reinforced fluid restriction to < 2 L daily, sodium restriction to less than 2000 mg daily, and the importance of daily weights.    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  - Long talk about disease process and therapies in clinic today.   2. Atrial fibrillation:  Paroxysmal.   - Regular on exam.  - Continue eliquis. Denies bleeding.  - Continue amiodarone 200 mg daily. LFTs and TSH stable 08/18/17. Needs to continue yearly eye exams.   3. CAD: - Mild, nonobstructive by cath 07/2013 - No s/s of ischemia.      - No ASA given Eliquis use.   4. Hyperlipidemia:  -  Good lipids 1/18.  - Continue current dose of crestor.   5. Morbid Obesity:  - Body mass index is 57.91 kg/m.  - Encouraged weight loss.   6. Bilateral Carpal Tunnel symptoms - Per PCP.   7. Snoring - She states she has had a sleep study before and did NOT need CPAP. Refuses re-check. States she could never tolerate the mask.   Doing well overall.  Fluid looks OK. Keep 1 month appt with Dr. Aundra Dubin.  Shirley Friar, PA-C  09/15/17   Greater than 50% of the 25 minute visit was spent in counseling/coordination of care regarding disease state education, salt/fluid restriction, sliding scale diuretics, and medication compliance.

## 2017-09-15 NOTE — Patient Instructions (Signed)
No changes!  Your physician recommends that you schedule a follow-up appointment as scheduled in 2 months with Dr Aundra Dubin  Do the following things EVERYDAY: 1) Weigh yourself in the morning before breakfast. Write it down and keep it in a log. 2) Take your medicines as prescribed 3) Eat low salt foods-Limit salt (sodium) to 2000 mg per day.  4) Stay as active as you can everyday 5) Limit all fluids for the day to less than 2 liters .

## 2017-10-13 ENCOUNTER — Other Ambulatory Visit: Payer: Self-pay | Admitting: Internal Medicine

## 2017-10-20 ENCOUNTER — Ambulatory Visit (HOSPITAL_COMMUNITY): Admission: RE | Admit: 2017-10-20 | Payer: Medicare HMO | Source: Ambulatory Visit

## 2017-10-20 ENCOUNTER — Encounter (HOSPITAL_COMMUNITY): Payer: Medicare HMO | Admitting: Cardiology

## 2017-12-16 ENCOUNTER — Other Ambulatory Visit (HOSPITAL_COMMUNITY): Payer: Self-pay | Admitting: Cardiology

## 2017-12-16 DIAGNOSIS — I4891 Unspecified atrial fibrillation: Secondary | ICD-10-CM

## 2017-12-22 ENCOUNTER — Ambulatory Visit (HOSPITAL_COMMUNITY)
Admission: RE | Admit: 2017-12-22 | Discharge: 2017-12-22 | Disposition: A | Discharge status: Home / Self Care | Payer: Medicare HMO | Source: Ambulatory Visit | Attending: Student | Admitting: Student

## 2017-12-22 ENCOUNTER — Ambulatory Visit (HOSPITAL_BASED_OUTPATIENT_CLINIC_OR_DEPARTMENT_OTHER)
Admission: RE | Admit: 2017-12-22 | Discharge: 2017-12-22 | Disposition: A | Discharge status: Home / Self Care | Payer: Medicare HMO | Source: Ambulatory Visit | Attending: Cardiology | Admitting: Cardiology

## 2017-12-22 ENCOUNTER — Other Ambulatory Visit (HOSPITAL_COMMUNITY): Payer: Self-pay | Admitting: *Deleted

## 2017-12-22 ENCOUNTER — Encounter (HOSPITAL_COMMUNITY): Payer: Self-pay | Admitting: *Deleted

## 2017-12-22 VITALS — BP 111/57 | HR 110 | Wt 364.6 lb

## 2017-12-22 DIAGNOSIS — I428 Other cardiomyopathies: Secondary | ICD-10-CM | POA: Insufficient documentation

## 2017-12-22 DIAGNOSIS — I480 Paroxysmal atrial fibrillation: Secondary | ICD-10-CM

## 2017-12-22 DIAGNOSIS — I2510 Atherosclerotic heart disease of native coronary artery without angina pectoris: Secondary | ICD-10-CM | POA: Insufficient documentation

## 2017-12-22 DIAGNOSIS — I5022 Chronic systolic (congestive) heart failure: Secondary | ICD-10-CM | POA: Insufficient documentation

## 2017-12-22 DIAGNOSIS — I4891 Unspecified atrial fibrillation: Secondary | ICD-10-CM

## 2017-12-22 DIAGNOSIS — I350 Nonrheumatic aortic (valve) stenosis: Secondary | ICD-10-CM | POA: Diagnosis not present

## 2017-12-22 DIAGNOSIS — Z79899 Other long term (current) drug therapy: Secondary | ICD-10-CM | POA: Insufficient documentation

## 2017-12-22 DIAGNOSIS — Z6841 Body Mass Index (BMI) 40.0 and over, adult: Secondary | ICD-10-CM | POA: Insufficient documentation

## 2017-12-22 DIAGNOSIS — E119 Type 2 diabetes mellitus without complications: Secondary | ICD-10-CM | POA: Diagnosis not present

## 2017-12-22 DIAGNOSIS — G5603 Carpal tunnel syndrome, bilateral upper limbs: Secondary | ICD-10-CM | POA: Diagnosis not present

## 2017-12-22 DIAGNOSIS — E785 Hyperlipidemia, unspecified: Secondary | ICD-10-CM | POA: Insufficient documentation

## 2017-12-22 DIAGNOSIS — Z7901 Long term (current) use of anticoagulants: Secondary | ICD-10-CM | POA: Diagnosis not present

## 2017-12-22 DIAGNOSIS — I110 Hypertensive heart disease with heart failure: Secondary | ICD-10-CM | POA: Insufficient documentation

## 2017-12-22 DIAGNOSIS — E854 Organ-limited amyloidosis: Secondary | ICD-10-CM | POA: Diagnosis not present

## 2017-12-22 LAB — COMPREHENSIVE METABOLIC PANEL
ALT: 17 U/L (ref 0–44)
AST: 19 U/L (ref 15–41)
Albumin: 4 g/dL (ref 3.5–5.0)
Alkaline Phosphatase: 74 U/L (ref 38–126)
Anion gap: 8 (ref 5–15)
BUN: 18 mg/dL (ref 8–23)
CO2: 28 mmol/L (ref 22–32)
Calcium: 9.6 mg/dL (ref 8.9–10.3)
Chloride: 106 mmol/L (ref 98–111)
Creatinine, Ser: 1.27 mg/dL — ABNORMAL HIGH (ref 0.44–1.00)
GFR calc Af Amer: 52 mL/min — ABNORMAL LOW (ref >60)
GFR calc non Af Amer: 45 mL/min — ABNORMAL LOW (ref >60)
Glucose, Bld: 87 mg/dL (ref 70–99)
Potassium: 3.8 mmol/L (ref 3.5–5.1)
Sodium: 142 mmol/L (ref 135–145)
Total Bilirubin: 0.9 mg/dL (ref 0.3–1.2)
Total Protein: 7.1 g/dL (ref 6.5–8.1)

## 2017-12-22 LAB — TSH: TSH: 0.856 u[IU]/mL (ref 0.350–4.500)

## 2017-12-22 MED ORDER — AMIODARONE HCL 200 MG PO TABS: 200.0000 mg | ORAL_TABLET | Freq: Every day | ORAL | 3 refills | Status: DC

## 2017-12-22 MED ORDER — LISINOPRIL 10 MG PO TABS: 10.0000 mg | ORAL_TABLET | Freq: Every day | ORAL | 2 refills | Status: DC

## 2017-12-22 MED ORDER — FUROSEMIDE 40 MG PO TABS: ORAL_TABLET | ORAL | 3 refills | Status: DC

## 2017-12-22 NOTE — Progress Notes (Signed)
  Echocardiogram 2D Echocardiogram has been performed.  Roseanna Rainbow R 12/22/2017, 1:50 PM

## 2017-12-22 NOTE — Progress Notes (Signed)
Patient ID: Kimberly [name], female   DOB: 1956-02-12, 62 y.o.   MRN: [id number] PCP: Dr. Gery Pray [facility]) Cardiology: Dr. Bishop Limbo Whitehurst is a 62 y.o. female with history of paroxysmal atrial fibrillation, obesity, and nonischemic cardiomyopathy presents for cardiology followup.  Patient was admitted in 3/15 at [facility] with atrial fibrillation and CHF.  TEE was done in preparation for DCCV, showing EF 25-30% but there was LA thrombus so DCCV was not done.  Plan was for anticoagulation x 3 months followed by TEE-guided DCCV in the OR (due to patient's body habitus).  LHC was also done, showing mild nonobstructive CAD.  Patient was diuresed with IV Lasix in the hospital and started on apixaban.   Patient had TEE again in 6/15, showing EF 35-40% with diffuse hypokinesis and no LAA thrombus.  She was cardioverted to NSR (with difficulty).  I started her at that time on amiodarone.  She then went back into atrial fibrillation.  After she had been loaded on amiodarone, cardioverted her again in 7/15 to NSR.  She has been back and forth between NSR and atrial fibrillation since that time.    She had gone back into atrial fibrillation in 1/18 and amiodarone was increased to bid in preparation for DCCV. However, she spontaneously converted to NSR.  PYP scan in 4/19 was grade 3, strongly suggestive of TTR amyloidosis.  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  Echo done today and reviewed showed EF 60%, moderate LVH, mild aortic stenosis.   She presents today for followup of CHF.  She is back in atrial fibrillation today.  She is not sure how long this has been.  She has frequent lightheadedness with standing, BP is not low today.  She is short of breath on stairs and has knee pain.  She is not short of breath walking on flat ground.  No PND/orthopnea.  No chest pain.  No BRBPR/melena. Weight is up 6 lbs.   ECG (personally reviewed): Atrial fibrillation in 90s  Labs  (4/19): LFTs normal, K 4, creatinine 1.24, TSH normal, immunofixation normal  PMH: 1. Obese 2. Atrial fibrillation: Paroxysmal.  Unable to cardiovert in 3/15 due to LAA thrombus noted on TEE. TEE in 6/15 without LAA thrombus.  Patient had cardioversion to NSR with difficulty but back in atrial fibrillation by 7/15 appt.  She was started on amiodarone and cardioverted in 7/15.  Has been back and forth between AF and NSR since then.  3. H/o TKR 4. Nonischemic cardiomyopathy: TEE (3/15) with EF 25-30%, moderate MR, PA systolic pressure 51 mmHg, moderate to severe TR, LA thrombus was noted.   LHC (3/15) with 50% stenosis small PLV.  TEE (6/15) with EF 35-40%, diffuse hypokinesis, mild MR, moderate TR, +PFO, no LA appendage thrombus.  - Echo (10/15) with EF 60-65%.   - ECHO 06/10/2016 EF 60-65% Mild AS - Echo (8/19): EF 60%, moderate LVH, mild aortic stenosis.  - PYP scan in 4/19 was grade 3, strongly suggestive of transthyretin amyloidosis.  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  5. CAD: Nonobstructive.  LHC (3/15) with 50% stenosis small PLV. 6. PFO 7. Left Breast mass: Was taken for lumpectomy in 1/18 but mass had resolved.  8. Aortic stenosis: Mild.   SH: Married, lives in [place], nonsmoker  FH: No premature CAD or cardiomyopathy.  Review of systems complete and found to be negative unless listed in HPI.   Current Outpatient Medications  Medication Sig Dispense Refill  .  acetaminophen (TYLENOL) 650 MG CR tablet Take 650 mg by mouth every 8 (eight) hours as needed for pain.    [name] amiodarone (PACERONE) 200 MG tablet TAKE 1 TABLET (200 MG TOTAL) BY MOUTH 2 (TWO) TIMES DAILY. 180 tablet 3  . Cholecalciferol (VITAMIN D-3) 5000 UNITS TABS Take 5,000 Units by mouth daily.    [name] docusate sodium (COLACE) 100 MG capsule Take 100 mg by mouth at bedtime.     [name] ELIQUIS 5 MG TABS tablet TAKE 1 TABLET BY MOUTH TWICE A DAY 60 tablet 5  . Ferrous Sulfate (PX IRON) 27 MG TABS Take 1 tablet by mouth  daily.    . furosemide (LASIX) 40 MG tablet Take 1 tablet (40 mg total) by mouth daily. Take extra tablet once daily AS NEEDED for weight gain 3 lbs overnight/5 lbs in 1 week. 90 tablet 3  . HYDROcodone-acetaminophen (NORCO/VICODIN) 5-325 MG per tablet Take 2 tablets by mouth every 4 (four) hours as needed. 16 tablet 0  . lisinopril (PRINIVIL,ZESTRIL) 10 MG tablet TAKE 1 TABLET (10 MG TOTAL) 2 (TWO) TIMES DAILY BY MOUTH. NEEDS OFFICE VISIT 60 tablet 2  . metoprolol succinate (TOPROL-XL) 50 MG 24 hr tablet TAKE 1 & 1/2 TABLETS BY MOUTH TWICE A DAY 270 tablet 2  . nitroGLYCERIN (NITROSTAT) 0.4 MG SL tablet Place 1 tablet (0.4 mg total) under the tongue every 5 (five) minutes as needed for chest pain. 30 tablet 1  . Omega-3 Fatty Acids (FISH OIL) 600 MG CAPS Take 600 Units by mouth 2 (two) times daily.    . rosuvastatin (CRESTOR) 40 MG tablet Take 40 mg by mouth at bedtime.  6  . spironolactone (ALDACTONE) 25 MG tablet Take 1 tablet (25 mg total) by mouth daily. Needs OV for more refills 90 tablet 0   No current facility-administered medications for this encounter.    Vitals:   12/22/17 1350  BP: (!) 111/57  Pulse: (!) 110  SpO2: 99%  Weight: (!) 364 lb 9.6 oz (165.4 kg)    Wt Readings from Last 3 Encounters:  12/22/17 (!) 364 lb 9.6 oz (165.4 kg)  09/15/17 (!) 358 lb 12.8 oz (162.8 kg)  08/18/17 (!) 360 lb (163.3 kg)   Physical Exam General: NAD, obese Neck: JVP 8-9 cm, no thyromegaly or thyroid nodule.  Lungs: Clear to auscultation bilaterally with normal respiratory effort. CV: nonpalpable PMI.  Heart irregular S1/S2, no S3/S4, no murmur.  1+ ankle edema.  No carotid bruit.  Normal pedal pulses.  Abdomen: Soft, nontender, no hepatosplenomegaly, no distention.  Skin: Intact without lesions or rashes.  Neurologic: Alert and oriented x 3.  Psych: Normal affect. Extremities: No clubbing or cyanosis.  HEENT: Normal.   Assessment/Plan: 1. Chronic systolic CHF: Nonischemic  cardiomyopathy.  EF 35-40% on TEE in 6/15 but then EF back to 60-65% by 10/15 and remained normal on today's echo.  LHC without significant coronary disease in 3/15.  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 NYHA class III symptoms, she is volume overloaded on exam.  - Increase Lasix to 40 mg bid x 5 days, then 40 qam/20 qpm after that.  BMET today and again in 10 days.  - With occasional orthostatic symptoms, decrease lisinopril to 10 mg once daily.  - Continue spiro 25 mg daily - Continue Toprol 75 mg BID 2. 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  Atrial fibrillation goes along with amyloidosis.  Her orthostatic symptoms may also be related to amyloidosis.  - I am going to arrange for treatment with tafamidis.  2. Atrial fibrillation:  Paroxysmal.  She is back in atrial fibrillation today.  - Continue Toprol XL 75 mg bid.  - Continue Eliquis, has not missed doses.  - Continue amiodarone 200 mg bid x 1 week then back to daily. Check LFTs and TSH today. Needs to continue yearly eye exams.  - I will arrange for DCCV. I think she will do better in NSR.  We discussed risks/benefits and she agrees to proceed.  3. CAD: Mild, nonobstructive by cath 07/2013 - No ASA given Eliquis use.   - Continue current dose of crestor.  4. Bilateral Carpal Tunnel symptoms: Likely related to amyloidosis.   Followup in 3 wks with APP.   [name] 12/23/2017

## 2017-12-22 NOTE — Patient Instructions (Signed)
Labs today (will call for abnormal results, otherwise no news is good news)  DECREASE Lisinopril to 10 mg Once Daily  DECREASE Amiodarone to 200 mg Once Daily in 7 days.   INCREASE Lasix to 40 mg Twice Daily for 5 days only, then START taking 40 mg in the AM and 20 mg in the PM.   Please schedule an appointment with an eye doctor for possible side effects of Amiodarone.   You have been scheduled for a Cardioversion, please see attached Instruction sheet.  Follow up in our [facility] in 3 weeks.

## 2017-12-25 ENCOUNTER — Other Ambulatory Visit (HOSPITAL_COMMUNITY): Payer: Self-pay | Admitting: Internal Medicine

## 2017-12-31 ENCOUNTER — Encounter (HOSPITAL_COMMUNITY)
Admission: RE | Disposition: A | Discharge status: Home / Self Care | Payer: Self-pay | Source: Ambulatory Visit | Attending: Cardiology

## 2017-12-31 ENCOUNTER — Ambulatory Visit (HOSPITAL_COMMUNITY)
Admission: RE | Admit: 2017-12-31 | Discharge: 2017-12-31 | Disposition: A | Discharge status: Home / Self Care | Payer: Medicare HMO | Source: Ambulatory Visit | Attending: Cardiology | Admitting: Cardiology

## 2017-12-31 DIAGNOSIS — Z538 Procedure and treatment not carried out for other reasons: Secondary | ICD-10-CM | POA: Insufficient documentation

## 2017-12-31 DIAGNOSIS — I491 Atrial premature depolarization: Secondary | ICD-10-CM | POA: Diagnosis not present

## 2017-12-31 SURGERY — CANCELLED PROCEDURE

## 2017-12-31 NOTE — Progress Notes (Signed)
Patient in sinus rhythm on monitor on arrival to endo for outpatient cardioversion. 12 lead EKG reveals same. Dr. Aundra [name] reviewed and verbal order to cancel cardioversion and d/c to home. Patient notified of this, she expresses understanding and agrees.

## 2018-01-08 ENCOUNTER — Telehealth (HOSPITAL_COMMUNITY): Payer: Self-pay | Admitting: *Deleted

## 2018-01-08 NOTE — Telephone Encounter (Deleted)
Received note from [facility] pt is sch for left breast lumpectomy on 01/12/18 under general anesthesia and needs clearance from [place].    Per Dr Haroldine Laws: "Pt is low to moderate risk for peri-op CV complications.  Ok to proceed from cardiovascular standpoint"  Note faxed back to [facility] atten Carlene Coria, [place] at [phone]

## 2018-01-12 ENCOUNTER — Ambulatory Visit (HOSPITAL_COMMUNITY)
Admission: RE | Admit: 2018-01-12 | Discharge: 2018-01-12 | Disposition: A | Discharge status: Home / Self Care | Payer: Medicare HMO | Source: Ambulatory Visit | Attending: Internal Medicine | Admitting: Internal Medicine

## 2018-01-12 VITALS — BP 120/62 | HR 60 | Wt 363.4 lb

## 2018-01-12 DIAGNOSIS — Z7901 Long term (current) use of anticoagulants: Secondary | ICD-10-CM | POA: Diagnosis not present

## 2018-01-12 DIAGNOSIS — I480 Paroxysmal atrial fibrillation: Secondary | ICD-10-CM | POA: Insufficient documentation

## 2018-01-12 DIAGNOSIS — E1169 Type 2 diabetes mellitus with other specified complication: Secondary | ICD-10-CM | POA: Diagnosis not present

## 2018-01-12 DIAGNOSIS — I2510 Atherosclerotic heart disease of native coronary artery without angina pectoris: Secondary | ICD-10-CM | POA: Diagnosis not present

## 2018-01-12 DIAGNOSIS — I43 Cardiomyopathy in diseases classified elsewhere: Secondary | ICD-10-CM | POA: Diagnosis not present

## 2018-01-12 DIAGNOSIS — I5022 Chronic systolic (congestive) heart failure: Secondary | ICD-10-CM | POA: Insufficient documentation

## 2018-01-12 DIAGNOSIS — Z6841 Body Mass Index (BMI) 40.0 and over, adult: Secondary | ICD-10-CM | POA: Insufficient documentation

## 2018-01-12 DIAGNOSIS — I350 Nonrheumatic aortic (valve) stenosis: Secondary | ICD-10-CM | POA: Insufficient documentation

## 2018-01-12 DIAGNOSIS — Z79899 Other long term (current) drug therapy: Secondary | ICD-10-CM | POA: Insufficient documentation

## 2018-01-12 DIAGNOSIS — E669 Obesity, unspecified: Secondary | ICD-10-CM | POA: Insufficient documentation

## 2018-01-12 DIAGNOSIS — E854 Organ-limited amyloidosis: Secondary | ICD-10-CM | POA: Diagnosis not present

## 2018-01-12 DIAGNOSIS — I428 Other cardiomyopathies: Secondary | ICD-10-CM | POA: Diagnosis not present

## 2018-01-12 DIAGNOSIS — R202 Paresthesia of skin: Secondary | ICD-10-CM

## 2018-01-12 DIAGNOSIS — R200 Anesthesia of skin: Secondary | ICD-10-CM

## 2018-01-12 NOTE — Progress Notes (Signed)
Patient ID: Kimberly [name], female   DOB: 08/01/1955, 62 y.o.   MRN: [id number]     Advanced Heart Failure Clinic Note  PCP: Dr. Gery Pray [facility]) Cardiology: Dr. Bishop Limbo Ikard is a 62 y.o. female with history of paroxysmal atrial fibrillation, obesity, and nonischemic cardiomyopathy presents for cardiology followup.  Patient was admitted in 3/15 at [facility] with atrial fibrillation and CHF.  TEE was done in preparation for DCCV, showing EF 25-30% but there was LA thrombus so DCCV was not done.  Plan was for anticoagulation x 3 months followed by TEE-guided DCCV in the OR (due to patient's body habitus).  LHC was also done, showing mild nonobstructive CAD.  Patient was diuresed with IV Lasix in the hospital and started on apixaban.   Patient had TEE again in 6/15, showing EF 35-40% with diffuse hypokinesis and no LAA thrombus.  She was cardioverted to NSR (with difficulty).  I started her at that time on amiodarone.  She then went back into atrial fibrillation.  After she had been loaded on amiodarone, cardioverted her again in 7/15 to NSR.  She has been back and forth between NSR and atrial fibrillation since that time.    She had gone back into atrial fibrillation in 1/18 and amiodarone was increased to bid in preparation for DCCV. However, she spontaneously converted to NSR.  PYP scan in 4/19 was grade 3, strongly suggestive of TTR amyloidosis.  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  Echo done today and reviewed showed EF 60%, moderate LVH, mild aortic stenosis.   She presents today for follow of CHF. Last visit in clinic was back in Afib so scheduled for DCCV, but had converted back to NSR on arrival. Lisinopril cut back with orthostasis. Remains in NSR today. She is no longer having dizziness, but also started on "a pill" by her PCP (? Meclizine) for vertigo).  She denies SOB getting around the house or on flat ground. No orthopnea or PND. No BRBPR or  melena. No CP. She gets up the stairs OK but is more limited from her knee arthritis. Taking all medications as directed.   ECG: NSR 62 bpm with occasional PACs. Personally reviewed.   Labs (4/19): LFTs normal, K 4, creatinine 1.24, TSH normal, immunofixation normal  PMH: 1. Obese 2. Atrial fibrillation: Paroxysmal.  Unable to cardiovert in 3/15 due to LAA thrombus noted on TEE. TEE in 6/15 without LAA thrombus.  Patient had cardioversion to NSR with difficulty but back in atrial fibrillation by 7/15 appt.  She was started on amiodarone and cardioverted in 7/15.  Has been back and forth between AF and NSR since then.  3. H/o TKR 4. Nonischemic cardiomyopathy: TEE (3/15) with EF 25-30%, moderate MR, PA systolic pressure 51 mmHg, moderate to severe TR, LA thrombus was noted.   LHC (3/15) with 50% stenosis small PLV.  TEE (6/15) with EF 35-40%, diffuse hypokinesis, mild MR, moderate TR, +PFO, no LA appendage thrombus.  - Echo (10/15) with EF 60-65%.   - ECHO 06/10/2016 EF 60-65% Mild AS - Echo (8/19): EF 60%, moderate LVH, mild aortic stenosis.  - PYP scan in 4/19 was grade 3, strongly suggestive of transthyretin amyloidosis.  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  5. CAD: Nonobstructive.  LHC (3/15) with 50% stenosis small PLV. 6. PFO 7. Left Breast mass: Was taken for lumpectomy in 1/18 but mass had resolved.  8. Aortic stenosis: Mild.   SH: Married, lives in  [place], nonsmoker  FH: No premature CAD or cardiomyopathy.  Review of systems complete and found to be negative unless listed in HPI.    Current Outpatient Medications  Medication Sig Dispense Refill  . acetaminophen (TYLENOL) 650 MG CR tablet Take 1,300 mg by mouth every 8 (eight) hours as needed for pain.     [name] amiodarone (PACERONE) 200 MG tablet Take 1 tablet (200 mg total) by mouth daily. 90 tablet 3  . Cholecalciferol (VITAMIN D-3) 5000 UNITS TABS Take 5,000 Units by mouth daily.    [name] ELIQUIS 5 MG TABS tablet TAKE 1  TABLET BY MOUTH TWICE A DAY 60 tablet 5  . Ferrous Sulfate (PX IRON) 27 MG TABS Take 27 mg by mouth 2 (two) times daily.     . furosemide (LASIX) 40 MG tablet Take 40 mg (1 Tablet) in the AM and 20 mg (0.5 Tablets) in the PM. 135 tablet 3  . lisinopril (PRINIVIL,ZESTRIL) 10 MG tablet Take 1 tablet (10 mg total) by mouth daily. 90 tablet 2  . nitroGLYCERIN (NITROSTAT) 0.4 MG SL tablet Place 1 tablet (0.4 mg total) under the tongue every 5 (five) minutes as needed for chest pain. 30 tablet 1  . Omega-3 Fatty Acids (FISH OIL) 600 MG CAPS Take 1,200 Units by mouth daily.     . rosuvastatin (CRESTOR) 40 MG tablet Take 40 mg by mouth at bedtime.  6  . spironolactone (ALDACTONE) 25 MG tablet Take 1 tablet (25 mg total) by mouth daily. Needs OV for more refills 90 tablet 0  . metoprolol succinate (TOPROL-XL) 50 MG 24 hr tablet TAKE 1 & 1/2 TABLETS BY MOUTH TWICE A DAY (Patient not taking: Reported on 01/12/2018) 270 tablet 2   No current facility-administered medications for this encounter.    Vitals:   01/12/18 1423  BP: 120/62  Pulse: 60  SpO2: 98%  Weight: (!) 164.8 kg (363 lb 6.4 oz)    Wt Readings from Last 3 Encounters:  01/12/18 (!) 164.8 kg (363 lb 6.4 oz)  12/22/17 (!) 165.4 kg (364 lb 9.6 oz)  09/15/17 (!) 162.8 kg (358 lb 12.8 oz)   Physical Exam General: Well appearing. No resp difficulty. HEENT: Normal Neck: Supple. JVP difficult due to body habitus. Carotids 2+ bilat; no bruits. No thyromegaly or nodule noted. Cor: PMI nondisplaced. RRR, No M/G/R noted Lungs: CTAB, normal effort. Abdomen: Soft, non-tender, non-distended, no HSM. No bruits or masses. +BS  Extremities: No cyanosis, clubbing, or rash. R and LLE no edema.  Neuro: Alert & orientedx3, cranial nerves grossly intact. moves all 4 extremities w/o difficulty. Affect pleasant   Assessment/Plan: 1. Chronic systolic CHF: Nonischemic cardiomyopathy.  - EF 35-40% on TEE in 6/15 but then EF back to 60-65% by 10/15 and  remained normal on today's echo.  LHC without significant coronary disease in 3/15.  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  - NYHA II-III symptoms. - Volume status look stable on exam.   - Continue lasix 40 mg q am and 20 mg q pm. Can take extra as needed - Continue lisinopril 10 mg daily.  - Continue spiro 25 mg daily - Continue Toprol 75 mg BID - Reinforced fluid restriction to < 2 L daily, sodium restriction to less than 2000 mg daily, and the importance of daily weights.   2. Cardiac Amyloidosis: - PYP scan 09/01/17 suggestive of transthyretin amyloid (Grade 3,  H/CLL equal 1.95). 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  Atrial fibrillation goes along with amyloidosis.  Her orthostatic symptoms may also be related to amyloidosis.  - We are in the process of arranging for treatment with tafamidis.  3. Atrial fibrillation:  Paroxysmal.  -  EKG today shows NSR 62 bpm with occasional PACs. Personally reviewed.  - Continue Toprol XL 75 mg bid.  - Continue Eliquis, has not missed doses.  - Continue amiodarone 200 mg bid x 1 week then back to daily. Check LFTs and TSH today. Needs to continue yearly eye exams.  4. CAD: Mild, nonobstructive by cath 07/2013 - No ASA given Eliquis use.   - Continue current dose of crestor.  5. Bilateral Carpal Tunnel symptoms: -  Likely related to amyloidosis. No change.   Remains in NSR. Volume status OK. Working on [employer]. RTC 2 months. Sooner with symptoms.   Shirley Friar, PA-C  01/12/2018  Greater than 50% of the 25 minute visit was spent in counseling/coordination of care regarding disease state education, salt/fluid restriction, sliding scale diuretics, and medication compliance.

## 2018-01-12 NOTE — Telephone Encounter (Signed)
Opened in error

## 2018-03-16 ENCOUNTER — Ambulatory Visit (HOSPITAL_COMMUNITY)
Admission: RE | Admit: 2018-03-16 | Discharge: 2018-03-16 | Disposition: A | Discharge status: Home / Self Care | Payer: Medicare HMO | Source: Ambulatory Visit | Attending: Cardiology | Admitting: Cardiology

## 2018-03-16 VITALS — HR 64 | Wt 364.4 lb

## 2018-03-16 DIAGNOSIS — I480 Paroxysmal atrial fibrillation: Secondary | ICD-10-CM | POA: Diagnosis not present

## 2018-03-16 DIAGNOSIS — I5022 Chronic systolic (congestive) heart failure: Secondary | ICD-10-CM | POA: Diagnosis not present

## 2018-03-16 DIAGNOSIS — I43 Cardiomyopathy in diseases classified elsewhere: Secondary | ICD-10-CM

## 2018-03-16 DIAGNOSIS — I428 Other cardiomyopathies: Secondary | ICD-10-CM | POA: Insufficient documentation

## 2018-03-16 DIAGNOSIS — I350 Nonrheumatic aortic (valve) stenosis: Secondary | ICD-10-CM | POA: Insufficient documentation

## 2018-03-16 DIAGNOSIS — E669 Obesity, unspecified: Secondary | ICD-10-CM | POA: Insufficient documentation

## 2018-03-16 DIAGNOSIS — Z7901 Long term (current) use of anticoagulants: Secondary | ICD-10-CM | POA: Insufficient documentation

## 2018-03-16 DIAGNOSIS — Z79899 Other long term (current) drug therapy: Secondary | ICD-10-CM | POA: Insufficient documentation

## 2018-03-16 DIAGNOSIS — G5603 Carpal tunnel syndrome, bilateral upper limbs: Secondary | ICD-10-CM | POA: Insufficient documentation

## 2018-03-16 DIAGNOSIS — I2510 Atherosclerotic heart disease of native coronary artery without angina pectoris: Secondary | ICD-10-CM | POA: Insufficient documentation

## 2018-03-16 DIAGNOSIS — E854 Organ-limited amyloidosis: Secondary | ICD-10-CM | POA: Diagnosis not present

## 2018-03-16 DIAGNOSIS — I4891 Unspecified atrial fibrillation: Secondary | ICD-10-CM

## 2018-03-16 LAB — CBC
HCT: 31.1 % — ABNORMAL LOW (ref 36.0–46.0)
Hemoglobin: 9.4 g/dL — ABNORMAL LOW (ref 12.0–15.0)
MCH: 28.1 pg (ref 26.0–34.0)
MCHC: 30.2 g/dL (ref 30.0–36.0)
MCV: 93.1 fL (ref 80.0–100.0)
Platelets: 242 10*3/uL (ref 150–400)
RBC: 3.34 MIL/uL — ABNORMAL LOW (ref 3.87–5.11)
RDW: 14.4 % (ref 11.5–15.5)
WBC: 4.6 10*3/uL (ref 4.0–10.5)
nRBC: 0 % (ref 0.0–0.2)

## 2018-03-16 LAB — BASIC METABOLIC PANEL
Anion gap: 9 (ref 5–15)
BUN: 16 mg/dL (ref 8–23)
CO2: 26 mmol/L (ref 22–32)
Calcium: 9.6 mg/dL (ref 8.9–10.3)
Chloride: 105 mmol/L (ref 98–111)
Creatinine, Ser: 1.3 mg/dL — ABNORMAL HIGH (ref 0.44–1.00)
GFR calc Af Amer: 50 mL/min — ABNORMAL LOW (ref >60)
GFR calc non Af Amer: 43 mL/min — ABNORMAL LOW (ref >60)
Glucose, Bld: 88 mg/dL (ref 70–99)
Potassium: 4.1 mmol/L (ref 3.5–5.1)
Sodium: 140 mmol/L (ref 135–145)

## 2018-03-16 LAB — TSH: TSH: 1.188 u[IU]/mL (ref 0.350–4.500)

## 2018-03-16 MED ORDER — FUROSEMIDE 40 MG PO TABS: ORAL_TABLET | ORAL | 3 refills | Status: DC

## 2018-03-16 NOTE — Patient Instructions (Addendum)
TAKE Lasix 40mg  (1 tab) in the morning and 20mg  (1/2 tab) in the evening.  The office will facilitate ordering your Tafamidis 61mg  (1tab) daily.   Labs drawn today. Will call you if results are abnormal.  Your physician requested you have labs drawn in 10 days.  Prescription sent to lab.  Your physician recommends that you schedule a follow-up appointment in: 3 months

## 2018-03-17 NOTE — Progress Notes (Signed)
Patient ID: Kimberly [name], female   DOB: 10-30-1955, 62 y.o.   MRN: [id number] PCP: Dr. Gery Pray [facility]) Cardiology: Dr. Bishop Limbo Canlas is a 62 y.o. female with history of paroxysmal atrial fibrillation, obesity, and nonischemic cardiomyopathy presents for cardiology followup.  Patient was admitted in 3/15 at [facility] with atrial fibrillation and CHF.  TEE was done in preparation for DCCV, showing EF 25-30% but there was LA thrombus so DCCV was not done.  Plan was for anticoagulation x 3 months followed by TEE-guided DCCV in the OR (due to patient's body habitus).  LHC was also done, showing mild nonobstructive CAD.  Patient was diuresed with IV Lasix in the hospital and started on apixaban.   Patient had TEE again in 6/15, showing EF 35-40% with diffuse hypokinesis and no LAA thrombus.  She was cardioverted to NSR (with difficulty).  I started her at that time on amiodarone.  She then went back into atrial fibrillation.  After she had been loaded on amiodarone, cardioverted her again in 7/15 to NSR.  She has been back and forth between NSR and atrial fibrillation since that time.   She had gone back into atrial fibrillation in 1/18 and amiodarone was increased to bid in preparation for DCCV. However, she spontaneously converted to NSR.  PYP scan in 4/19 was grade 3, strongly suggestive of TTR amyloidosis.  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  Echo 8/19 showed EF 60%, moderate LVH, mild aortic stenosis.  Of note, she has carpal tunnel syndrome.   She presents today for followup of CHF.  She is in NSR today.  She got tafamidis but says that the pills are too big for her to take (has to take 4/day) so she has not started it. No palpitations.  Overall, says her breathing is ok. Not short of breath walking around the house or walking short distances.  Not very active.  Weight is stable.     ECG (personally reviewed): NSR, PVC  Labs (4/19): LFTs normal, K 4,  creatinine 1.24, TSH normal, immunofixation normal Labs (8/19): TSH normal, K 3.8, creatinine 1.27, LFTs normal, TSH normal  PMH: 1. Obese 2. Atrial fibrillation: Paroxysmal.  Unable to cardiovert in 3/15 due to LAA thrombus noted on TEE. TEE in 6/15 without LAA thrombus.  Patient had cardioversion to NSR with difficulty but back in atrial fibrillation by 7/15 appt.  She was started on amiodarone and cardioverted in 7/15.  Has been back and forth between AF and NSR since then.  3. H/o TKR 4. Nonischemic cardiomyopathy: TEE (3/15) with EF 25-30%, moderate MR, PA systolic pressure 51 mmHg, moderate to severe TR, LA thrombus was noted.   LHC (3/15) with 50% stenosis small PLV.  TEE (6/15) with EF 35-40%, diffuse hypokinesis, mild MR, moderate TR, +PFO, no LA appendage thrombus.  - Echo (10/15) with EF 60-65%.   - ECHO 06/10/2016 EF 60-65% Mild AS - Echo (8/19): EF 60%, moderate LVH, mild aortic stenosis.  - PYP scan in 4/19 was grade 3, strongly suggestive of transthyretin amyloidosis.  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  5. CAD: Nonobstructive.  LHC (3/15) with 50% stenosis small PLV. 6. PFO 7. Left Breast mass: Was taken for lumpectomy in 1/18 but mass had resolved.  8. Aortic stenosis: Mild.  9. Carpal tunnel syndrome.   SH: Married, lives in [place], nonsmoker  FH: No premature CAD or cardiomyopathy.  Review of systems complete and found to be negative unless  listed in HPI.   Current Outpatient Medications  Medication Sig Dispense Refill  . acetaminophen (TYLENOL) 650 MG CR tablet Take 1,300 mg by mouth every 8 (eight) hours as needed for pain.     [name] amiodarone (PACERONE) 200 MG tablet Take 1 tablet (200 mg total) by mouth daily. 90 tablet 3  . Cholecalciferol (VITAMIN D-3) 5000 UNITS TABS Take 5,000 Units by mouth daily.    [name] ELIQUIS 5 MG TABS tablet TAKE 1 TABLET BY MOUTH TWICE A DAY 60 tablet 5  . Ferrous Sulfate (PX IRON) 27 MG TABS Take 27 mg by mouth 2 (two) times daily.      . furosemide (LASIX) 40 MG tablet Take 40 mg (1 Tablet) in the AM and 20 mg (0.5 Tablets) in the PM. 135 tablet 3  . lisinopril (PRINIVIL,ZESTRIL) 10 MG tablet Take 1 tablet (10 mg total) by mouth daily. 90 tablet 2  . metoprolol succinate (TOPROL-XL) 50 MG 24 hr tablet TAKE 1 & 1/2 TABLETS BY MOUTH TWICE A DAY 270 tablet 2  . nitroGLYCERIN (NITROSTAT) 0.4 MG SL tablet Place 1 tablet (0.4 mg total) under the tongue every 5 (five) minutes as needed for chest pain. 30 tablet 1  . Omega-3 Fatty Acids (FISH OIL) 600 MG CAPS Take 1,200 Units by mouth daily.     . rosuvastatin (CRESTOR) 40 MG tablet Take 40 mg by mouth at bedtime.  6  . spironolactone (ALDACTONE) 25 MG tablet Take 1 tablet (25 mg total) by mouth daily. Needs OV for more refills 90 tablet 0   No current facility-administered medications for this encounter.    Vitals:   03/16/18 1509  Pulse: 64  SpO2: 94%  Weight: (!) 165.3 kg (364 lb 6.4 oz)    Wt Readings from Last 3 Encounters:  03/16/18 (!) 165.3 kg (364 lb 6.4 oz)  01/12/18 (!) 164.8 kg (363 lb 6.4 oz)  12/22/17 (!) 165.4 kg (364 lb 9.6 oz)   Physical Exam General: NAD, obese.  Neck: JVP 8-9 cm, no thyromegaly or thyroid nodule.  Lungs: Clear to auscultation bilaterally with normal respiratory effort. CV: Nonpalpable PMI.  Heart regular S1/S2, no S3/S4, no murmur.  1+ ankle edema.  No carotid bruit.  Normal pedal pulses.  Abdomen: Soft, nontender, no hepatosplenomegaly, no distention.  Skin: Intact without lesions or rashes.  Neurologic: Alert and oriented x 3.  Psych: Normal affect. Extremities: No clubbing or cyanosis.  HEENT: Normal.   Assessment/Plan: 1. Chronic systolic CHF: Nonischemic cardiomyopathy.  EF 35-40% on TEE in 6/15 but then EF back to 60-65% by 10/15 and remained normal on last echo in 8/19.  LHC without significant coronary disease in 3/15.  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 NYHA class II-III symptoms, she is mildly volume overloaded on exam.   - She is taking Lasix 40 mg daily, increase to 40 qam/20 qpm.  BMET today and in 10 days.   - Continue lisinopril 10 mg daily.   - Continue spiro 25 mg daily - Continue Toprol 75 mg BID 2. 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  Atrial fibrillation and carpal tunnel syndrome, both of which she has, go along with amyloidosis.  Her history of orthostatic symptoms may also be related to amyloidosis.  - She has tafamidis but cannot take the  pills (too large).  I will see if I can get her the new once daily formulation of tafamidis, she is willing to try it.  2. Atrial fibrillation:  Paroxysmal.  NSR today.  - Continue Toprol XL 75 mg bid.  - Continue Eliquis, CBC today.  - Continue amiodarone 200 mg daily. Check LFTs and TSH today. Needs to continue yearly eye exams.  3. CAD: Mild, nonobstructive by cath 07/2013 - No ASA given Eliquis use.   - Continue current dose of crestor.  4. Bilateral carpal tunnel symptoms: Likely related to amyloidosis.   [name] 03/17/2018

## 2018-03-30 ENCOUNTER — Ambulatory Visit (HOSPITAL_COMMUNITY)
Admission: RE | Admit: 2018-03-30 | Discharge: 2018-03-30 | Disposition: A | Discharge status: Home / Self Care | Payer: Medicare HMO | Source: Ambulatory Visit | Attending: Internal Medicine | Admitting: Internal Medicine

## 2018-03-30 DIAGNOSIS — I5022 Chronic systolic (congestive) heart failure: Secondary | ICD-10-CM

## 2018-03-30 LAB — BASIC METABOLIC PANEL
Anion gap: 6 (ref 5–15)
BUN: 16 mg/dL (ref 8–23)
CO2: 27 mmol/L (ref 22–32)
Calcium: 9.5 mg/dL (ref 8.9–10.3)
Chloride: 108 mmol/L (ref 98–111)
Creatinine, Ser: 1.21 mg/dL — ABNORMAL HIGH (ref 0.44–1.00)
GFR calc Af Amer: 55 mL/min — ABNORMAL LOW (ref >60)
GFR calc non Af Amer: 47 mL/min — ABNORMAL LOW (ref >60)
Glucose, Bld: 90 mg/dL (ref 70–99)
Potassium: 3.8 mmol/L (ref 3.5–5.1)
Sodium: 141 mmol/L (ref 135–145)

## 2018-05-05 ENCOUNTER — Telehealth (HOSPITAL_COMMUNITY): Payer: Self-pay

## 2018-05-05 NOTE — Telephone Encounter (Signed)
Prior authorization through [employer] was APPROVED for [employer] 61mg  daily and will expire on 04/2019.

## 2018-06-14 ENCOUNTER — Other Ambulatory Visit (HOSPITAL_COMMUNITY): Payer: Self-pay | Admitting: Cardiology

## 2018-06-14 DIAGNOSIS — I4891 Unspecified atrial fibrillation: Secondary | ICD-10-CM

## 2018-06-22 ENCOUNTER — Encounter (HOSPITAL_COMMUNITY): Payer: Self-pay | Admitting: Cardiology

## 2018-06-22 ENCOUNTER — Other Ambulatory Visit (HOSPITAL_COMMUNITY): Payer: Self-pay

## 2018-06-22 ENCOUNTER — Encounter (HOSPITAL_COMMUNITY): Payer: Self-pay

## 2018-06-22 ENCOUNTER — Ambulatory Visit (HOSPITAL_COMMUNITY)
Admission: RE | Admit: 2018-06-22 | Discharge: 2018-06-22 | Disposition: A | Discharge status: Home / Self Care | Payer: Medicare HMO | Source: Ambulatory Visit | Attending: Cardiology | Admitting: Cardiology

## 2018-06-22 VITALS — BP 150/75 | HR 125 | Wt 370.6 lb

## 2018-06-22 DIAGNOSIS — R9431 Abnormal electrocardiogram [ECG] [EKG]: Secondary | ICD-10-CM | POA: Insufficient documentation

## 2018-06-22 DIAGNOSIS — E854 Organ-limited amyloidosis: Secondary | ICD-10-CM | POA: Insufficient documentation

## 2018-06-22 DIAGNOSIS — Z79899 Other long term (current) drug therapy: Secondary | ICD-10-CM | POA: Diagnosis not present

## 2018-06-22 DIAGNOSIS — I350 Nonrheumatic aortic (valve) stenosis: Secondary | ICD-10-CM | POA: Insufficient documentation

## 2018-06-22 DIAGNOSIS — G5603 Carpal tunnel syndrome, bilateral upper limbs: Secondary | ICD-10-CM | POA: Diagnosis not present

## 2018-06-22 DIAGNOSIS — I43 Cardiomyopathy in diseases classified elsewhere: Secondary | ICD-10-CM

## 2018-06-22 DIAGNOSIS — I2510 Atherosclerotic heart disease of native coronary artery without angina pectoris: Secondary | ICD-10-CM | POA: Diagnosis not present

## 2018-06-22 DIAGNOSIS — R000 Tachycardia, unspecified: Secondary | ICD-10-CM | POA: Insufficient documentation

## 2018-06-22 DIAGNOSIS — E669 Obesity, unspecified: Secondary | ICD-10-CM | POA: Diagnosis not present

## 2018-06-22 DIAGNOSIS — Z96659 Presence of unspecified artificial knee joint: Secondary | ICD-10-CM | POA: Insufficient documentation

## 2018-06-22 DIAGNOSIS — I480 Paroxysmal atrial fibrillation: Secondary | ICD-10-CM | POA: Diagnosis not present

## 2018-06-22 DIAGNOSIS — I428 Other cardiomyopathies: Secondary | ICD-10-CM | POA: Insufficient documentation

## 2018-06-22 DIAGNOSIS — Z7901 Long term (current) use of anticoagulants: Secondary | ICD-10-CM | POA: Insufficient documentation

## 2018-06-22 DIAGNOSIS — I5022 Chronic systolic (congestive) heart failure: Secondary | ICD-10-CM | POA: Insufficient documentation

## 2018-06-22 LAB — CBC
HCT: 29.6 % — ABNORMAL LOW (ref 36.0–46.0)
Hemoglobin: 8.8 g/dL — ABNORMAL LOW (ref 12.0–15.0)
MCH: 27.7 pg (ref 26.0–34.0)
MCHC: 29.7 g/dL — ABNORMAL LOW (ref 30.0–36.0)
MCV: 93.1 fL (ref 80.0–100.0)
Platelets: 239 10*3/uL (ref 150–400)
RBC: 3.18 MIL/uL — ABNORMAL LOW (ref 3.87–5.11)
RDW: 14.4 % (ref 11.5–15.5)
WBC: 4.1 10*3/uL (ref 4.0–10.5)
nRBC: 0 % (ref 0.0–0.2)

## 2018-06-22 LAB — COMPREHENSIVE METABOLIC PANEL
ALT: 17 U/L (ref 0–44)
AST: 23 U/L (ref 15–41)
Albumin: 3.8 g/dL (ref 3.5–5.0)
Alkaline Phosphatase: 74 U/L (ref 38–126)
Anion gap: 8 (ref 5–15)
BUN: 13 mg/dL (ref 8–23)
CO2: 25 mmol/L (ref 22–32)
Calcium: 9.3 mg/dL (ref 8.9–10.3)
Chloride: 108 mmol/L (ref 98–111)
Creatinine, Ser: 1.15 mg/dL — ABNORMAL HIGH (ref 0.44–1.00)
GFR calc Af Amer: 59 mL/min — ABNORMAL LOW (ref >60)
GFR calc non Af Amer: 51 mL/min — ABNORMAL LOW (ref >60)
Glucose, Bld: 89 mg/dL (ref 70–99)
Potassium: 3.9 mmol/L (ref 3.5–5.1)
Sodium: 141 mmol/L (ref 135–145)
Total Bilirubin: 0.7 mg/dL (ref 0.3–1.2)
Total Protein: 6.8 g/dL (ref 6.5–8.1)

## 2018-06-22 LAB — LIPID PANEL
Cholesterol: 183 mg/dL (ref 0–200)
HDL: 59 mg/dL (ref >40)
LDL Cholesterol: 114 mg/dL — ABNORMAL HIGH (ref 0–99)
Total CHOL/HDL Ratio: 3.1 RATIO
Triglycerides: 49 mg/dL (ref <150)
VLDL: 10 mg/dL (ref 0–40)

## 2018-06-22 LAB — TSH: TSH: 0.751 u[IU]/mL (ref 0.350–4.500)

## 2018-06-22 MED ORDER — TORSEMIDE 20 MG PO TABS: 60.0000 mg | ORAL_TABLET | Freq: Every day | ORAL | 3 refills | Status: DC

## 2018-06-22 MED ORDER — AMIODARONE HCL 200 MG PO TABS: 200.0000 mg | ORAL_TABLET | Freq: Every day | ORAL | 3 refills | Status: DC

## 2018-06-22 MED ORDER — TAFAMIDIS MEGLUMINE (CARDIAC) 20 MG PO CAPS: 80.0000 mg | ORAL_CAPSULE | Freq: Every day | ORAL | 3 refills | Status: DC

## 2018-06-22 MED ORDER — TAFAMIDIS 61 MG PO CAPS: 61.0000 mg | ORAL_CAPSULE | Freq: Every day | ORAL | Status: DC

## 2018-06-22 NOTE — Patient Instructions (Addendum)
Labs were done today. We will call you with any ABNORMAL results. No news is good news!  STOP taking your Lasix.   INCREASE your Amiodorone to 200mg  TWICE A DAY until your cardioversion is completed.   Begin taking Torsemide 60 mg (3 tabs) daily for THREE DAYS, then take 40 mg (2 tabs) daily.   You have been referred to complete a Sleep Study, this office will call you to schedule this appointment with you.  You have been scheduled for a Cardioversion, See instruction letter.   Your physician recommends that you schedule a follow-up appointment in 2 weeks with NP and lab work

## 2018-06-22 NOTE — Progress Notes (Signed)
na

## 2018-06-23 NOTE — Progress Notes (Signed)
Patient ID: Kimberly [name], female   DOB: [birth year], 63 y.o.   MRN: [id number] PCP: Dr. Gery Pray [facility]) Cardiology: Dr. Bishop Limbo Attwood is a 63 y.o. female with history of paroxysmal atrial fibrillation, obesity, and nonischemic cardiomyopathy presents for cardiology followup.  Patient was admitted in 3/15 at [facility] with atrial fibrillation and CHF.  TEE was done in preparation for DCCV, showing EF 25-30% but there was LA thrombus so DCCV was not done.  Plan was for anticoagulation x 3 months followed by TEE-guided DCCV in the OR (due to patient's body habitus).  LHC was also done, showing mild nonobstructive CAD.  Patient was diuresed with IV Lasix in the hospital and started on apixaban.   Patient had TEE again in 6/15, showing EF 35-40% with diffuse hypokinesis and no LAA thrombus.  She was cardioverted to NSR (with difficulty).  I started her at that time on amiodarone.  She then went back into atrial fibrillation.  After she had been loaded on amiodarone, cardioverted her again in 7/15 to NSR.  She has been back and forth between NSR and atrial fibrillation since that time.    She had gone back into atrial fibrillation in 1/18 and amiodarone was increased to bid in preparation for DCCV. However, she spontaneously converted to NSR.  PYP scan in 4/19 was grade 3, strongly suggestive of TTR amyloidosis.  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  Echo 8/19 showed EF 60%, moderate LVH, mild aortic stenosis.  Of note, she has carpal tunnel syndrome.  She was started on tafamidis.   She presents today for followup of CHF.  She is back in atrial fibrillation today.  She has been more short of breath for several weeks, gets dyspneic walking out to her car. She cut back on Lasix to 40 mg daily because she says that she does not "like taking Lasix."  She is short of breath with most activites.  +Orthopnea.  Weight is up about 6 lbs.  She has generalized fatigue.  No  chest pain.   ECG (personally reviewed): atrial fibrillation rate 96 bpm  Labs (4/19): LFTs normal, K 4, creatinine 1.24, TSH normal, immunofixation normal Labs (8/19): TSH normal, K 3.8, creatinine 1.27, LFTs normal, TSH normal Labs (11/19): K 3.8, creatinine 1.21  PMH: 1. Obese 2. Atrial fibrillation: Paroxysmal.  Unable to cardiovert in 3/15 due to LAA thrombus noted on TEE. TEE in 6/15 without LAA thrombus.  Patient had cardioversion to NSR with difficulty but back in atrial fibrillation by 7/15 appt.  She was started on amiodarone and cardioverted in 7/15.  Has been back and forth between AF and NSR since then.  3. H/o TKR 4. Nonischemic cardiomyopathy: TEE (3/15) with EF 25-30%, moderate MR, PA systolic pressure 51 mmHg, moderate to severe TR, LA thrombus was noted.   LHC (3/15) with 50% stenosis small PLV.  TEE (6/15) with EF 35-40%, diffuse hypokinesis, mild MR, moderate TR, +PFO, no LA appendage thrombus.  - Echo (10/15) with EF 60-65%.   - ECHO 06/10/2016 EF 60-65% Mild AS - Echo (8/19): EF 60%, moderate LVH, mild aortic stenosis.  - PYP scan in 4/19 was grade 3, strongly suggestive of transthyretin amyloidosis.  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  5. CAD: Nonobstructive.  LHC (3/15) with 50% stenosis small PLV. 6. PFO 7. Left Breast mass: Was taken for lumpectomy in 1/18 but mass had resolved.  8. Aortic stenosis: Mild.  9. Carpal tunnel syndrome.  SH: Married, lives in [place], nonsmoker  FH: No premature CAD or cardiomyopathy.  Review of systems complete and found to be negative unless listed in HPI.   Current Outpatient Medications  Medication Sig Dispense Refill  . acetaminophen (TYLENOL) 650 MG CR tablet Take 1,300 mg by mouth every 8 (eight) hours as needed for pain.     [name] amiodarone (PACERONE) 200 MG tablet Take 1 tablet (200 mg total) by mouth daily. 90 tablet 3  . Cholecalciferol (VITAMIN D-3) 5000 UNITS TABS Take 5,000 Units by mouth daily.    [name] ELIQUIS  5 MG TABS tablet TAKE 1 TABLET BY MOUTH TWICE A DAY 60 tablet 5  . Ferrous Sulfate (PX IRON) 27 MG TABS Take 27 mg by mouth 2 (two) times daily.     [name] lisinopril (PRINIVIL,ZESTRIL) 10 MG tablet Take 1 tablet (10 mg total) by mouth daily. 90 tablet 2  . metoprolol succinate (TOPROL-XL) 50 MG 24 hr tablet TAKE 1 & 1/2 TABLETS BY MOUTH TWICE A DAY 270 tablet 2  . Omega-3 Fatty Acids (FISH OIL) 600 MG CAPS Take 1,200 Units by mouth daily.     . rosuvastatin (CRESTOR) 40 MG tablet Take 40 mg by mouth at bedtime.  6  . spironolactone (ALDACTONE) 25 MG tablet TAKE 1 TABLET (25 MG TOTAL) BY MOUTH DAILY. 90 tablet 1  . nitroGLYCERIN (NITROSTAT) 0.4 MG SL tablet Place 1 tablet (0.4 mg total) under the tongue every 5 (five) minutes as needed for chest pain. (Patient not taking: Reported on 06/22/2018) 30 tablet 1  . Tafamidis (VYNDAMAX) 61 MG CAPS Take 61 mg by mouth daily. 30 capsule   . torsemide (DEMADEX) 20 MG tablet Take 3 tablets (60 mg total) by mouth daily. 90 tablet 3   No current facility-administered medications for this encounter.    Vitals:   06/22/18 1458  BP: (!) 150/75  Pulse: (!) 125  SpO2: 95%  Weight: (!) 168.1 kg (370 lb 9.6 oz)    Wt Readings from Last 3 Encounters:  06/22/18 (!) 168.1 kg (370 lb 9.6 oz)  03/16/18 (!) 165.3 kg (364 lb 6.4 oz)  01/12/18 (!) 164.8 kg (363 lb 6.4 oz)   Physical Exam General: NAD, morbidly obese.  Neck: JVP 10-12 cm, no thyromegaly or thyroid nodule.  Lungs: Clear to auscultation bilaterally with normal respiratory effort. CV: Nondisplaced PMI.  Heart irregular S1/S2, no S3/S4, 1/6 SEM RUSB. 1+ edema to knees bilaterally.  No carotid bruit.  Normal pedal pulses.  Abdomen: Soft, nontender, no hepatosplenomegaly, no distention.  Skin: Intact without lesions or rashes.  Neurologic: Alert and oriented x 3.  Psych: Normal affect. Extremities: No clubbing or cyanosis.  HEENT: Normal.   Assessment/Plan: 1. Chronic systolic CHF: Nonischemic  cardiomyopathy.  EF 35-40% on TEE in 6/15 but then EF back to 60-65% by 10/15 and remained normal on last echo in 8/19.  LHC without significant coronary disease in 3/15.  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 She is back in atrial fibrillation today with volume overload and worsened NYHA class III symptoms.  Suspect atrial fibrillation triggered CHF.  - She is resistant to taking a higher Lasix dose.  Stop Lasix and start torsemide 60 mg daily x 3 days then 40 mg daily after that.  BMET today and again in 10 days.    - Continue lisinopril 10 mg daily.   - Continue spiro  25 mg daily - Continue Toprol 75 mg BID - Will need to try to get her back in NSR.  2. 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  Atrial fibrillation and carpal tunnel syndrome, both of which she has, go along with amyloidosis.  Her history of orthostatic symptoms may also be related to amyloidosis.  - Continue tafamidis. 2. Atrial fibrillation:  Paroxysmal.  Back in atrial fibrillation today with worsening CHF.   - Continue Toprol XL 75 mg bid.  - Continue Eliquis, CBC today.  She has not missed doses.  - Increase amiodarone to 200 mg bid for now. Check LFTs and TSH today. Needs to continue yearly eye exams.  - I will arrange for DCCV.  Risks/benefits discussed with patient and she agrees to procedure.  - Based on size, think she would probably not be a good atrial fibrillation ablation candidate, but would be ideal to keep her out of atrial fibrillation given CHF and tachy-mediated CMP history. Will refer to afib clinic after cardioversion.  - Needs weight loss and sleep study as I suspect she has sleep apnea.  She agrees to sleep study, will arrange.  3. CAD: Mild,  nonobstructive by cath 07/2013 - No ASA given Eliquis use.   - Continue current dose of crestor. Lipids today. 4. Bilateral carpal tunnel symptoms: Likely related to amyloidosis.   Followup in 2 wks with NP/PA.   [name] 06/23/2018

## 2018-06-23 NOTE — H&P (View-Only) (Signed)
Patient ID: Kimberly [name], female   DOB: 1956-05-12, 63 y.o.   MRN: [id number] PCP: Dr. Gery Pray [facility]) Cardiology: Dr. Bishop Limbo Boback is a 63 y.o. female with history of paroxysmal atrial fibrillation, obesity, and nonischemic cardiomyopathy presents for cardiology followup.  Patient was admitted in 3/15 at [facility] with atrial fibrillation and CHF.  TEE was done in preparation for DCCV, showing EF 25-30% but there was LA thrombus so DCCV was not done.  Plan was for anticoagulation x 3 months followed by TEE-guided DCCV in the OR (due to patient's body habitus).  LHC was also done, showing mild nonobstructive CAD.  Patient was diuresed with IV Lasix in the hospital and started on apixaban.   Patient had TEE again in 6/15, showing EF 35-40% with diffuse hypokinesis and no LAA thrombus.  She was cardioverted to NSR (with difficulty).  I started her at that time on amiodarone.  She then went back into atrial fibrillation.  After she had been loaded on amiodarone, cardioverted her again in 7/15 to NSR.  She has been back and forth between NSR and atrial fibrillation since that time.    She had gone back into atrial fibrillation in 1/18 and amiodarone was increased to bid in preparation for DCCV. However, she spontaneously converted to NSR.  PYP scan in 4/19 was grade 3, strongly suggestive of TTR amyloidosis.  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  Echo 8/19 showed EF 60%, moderate LVH, mild aortic stenosis.  Of note, she has carpal tunnel syndrome.  She was started on tafamidis.   She presents today for followup of CHF.  She is back in atrial fibrillation today.  She has been more short of breath for several weeks, gets dyspneic walking out to her car. She cut back on Lasix to 40 mg daily because she says that she does not "like taking Lasix."  She is short of breath with most activites.  +Orthopnea.  Weight is up about 6 lbs.  She has generalized fatigue.  No  chest pain.   ECG (personally reviewed): atrial fibrillation rate 96 bpm  Labs (4/19): LFTs normal, K 4, creatinine 1.24, TSH normal, immunofixation normal Labs (8/19): TSH normal, K 3.8, creatinine 1.27, LFTs normal, TSH normal Labs (11/19): K 3.8, creatinine 1.21  PMH: 1. Obese 2. Atrial fibrillation: Paroxysmal.  Unable to cardiovert in 3/15 due to LAA thrombus noted on TEE. TEE in 6/15 without LAA thrombus.  Patient had cardioversion to NSR with difficulty but back in atrial fibrillation by 7/15 appt.  She was started on amiodarone and cardioverted in 7/15.  Has been back and forth between AF and NSR since then.  3. H/o TKR 4. Nonischemic cardiomyopathy: TEE (3/15) with EF 25-30%, moderate MR, PA systolic pressure 51 mmHg, moderate to severe TR, LA thrombus was noted.   LHC (3/15) with 50% stenosis small PLV.  TEE (6/15) with EF 35-40%, diffuse hypokinesis, mild MR, moderate TR, +PFO, no LA appendage thrombus.  - Echo (10/15) with EF 60-65%.   - ECHO 06/10/2016 EF 60-65% Mild AS - Echo (8/19): EF 60%, moderate LVH, mild aortic stenosis.  - PYP scan in 4/19 was grade 3, strongly suggestive of transthyretin amyloidosis.  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  5. CAD: Nonobstructive.  LHC (3/15) with 50% stenosis small PLV. 6. PFO 7. Left Breast mass: Was taken for lumpectomy in 1/18 but mass had resolved.  8. Aortic stenosis: Mild.  9. Carpal tunnel syndrome.  SH: Married, lives in [place], nonsmoker  FH: No premature CAD or cardiomyopathy.  Review of systems complete and found to be negative unless listed in HPI.   Current Outpatient Medications  Medication Sig Dispense Refill  . acetaminophen (TYLENOL) 650 MG CR tablet Take 1,300 mg by mouth every 8 (eight) hours as needed for pain.     [name] amiodarone (PACERONE) 200 MG tablet Take 1 tablet (200 mg total) by mouth daily. 90 tablet 3  . Cholecalciferol (VITAMIN D-3) 5000 UNITS TABS Take 5,000 Units by mouth daily.    [name] ELIQUIS  5 MG TABS tablet TAKE 1 TABLET BY MOUTH TWICE A DAY 60 tablet 5  . Ferrous Sulfate (PX IRON) 27 MG TABS Take 27 mg by mouth 2 (two) times daily.     [name] lisinopril (PRINIVIL,ZESTRIL) 10 MG tablet Take 1 tablet (10 mg total) by mouth daily. 90 tablet 2  . metoprolol succinate (TOPROL-XL) 50 MG 24 hr tablet TAKE 1 & 1/2 TABLETS BY MOUTH TWICE A DAY 270 tablet 2  . Omega-3 Fatty Acids (FISH OIL) 600 MG CAPS Take 1,200 Units by mouth daily.     . rosuvastatin (CRESTOR) 40 MG tablet Take 40 mg by mouth at bedtime.  6  . spironolactone (ALDACTONE) 25 MG tablet TAKE 1 TABLET (25 MG TOTAL) BY MOUTH DAILY. 90 tablet 1  . nitroGLYCERIN (NITROSTAT) 0.4 MG SL tablet Place 1 tablet (0.4 mg total) under the tongue every 5 (five) minutes as needed for chest pain. (Patient not taking: Reported on 06/22/2018) 30 tablet 1  . Tafamidis (VYNDAMAX) 61 MG CAPS Take 61 mg by mouth daily. 30 capsule   . torsemide (DEMADEX) 20 MG tablet Take 3 tablets (60 mg total) by mouth daily. 90 tablet 3   No current facility-administered medications for this encounter.    Vitals:   06/22/18 1458  BP: (!) 150/75  Pulse: (!) 125  SpO2: 95%  Weight: (!) 168.1 kg (370 lb 9.6 oz)    Wt Readings from Last 3 Encounters:  06/22/18 (!) 168.1 kg (370 lb 9.6 oz)  03/16/18 (!) 165.3 kg (364 lb 6.4 oz)  01/12/18 (!) 164.8 kg (363 lb 6.4 oz)   Physical Exam General: NAD, morbidly obese.  Neck: JVP 10-12 cm, no thyromegaly or thyroid nodule.  Lungs: Clear to auscultation bilaterally with normal respiratory effort. CV: Nondisplaced PMI.  Heart irregular S1/S2, no S3/S4, 1/6 SEM RUSB. 1+ edema to knees bilaterally.  No carotid bruit.  Normal pedal pulses.  Abdomen: Soft, nontender, no hepatosplenomegaly, no distention.  Skin: Intact without lesions or rashes.  Neurologic: Alert and oriented x 3.  Psych: Normal affect. Extremities: No clubbing or cyanosis.  HEENT: Normal.   Assessment/Plan: 1. Chronic systolic CHF: Nonischemic  cardiomyopathy.  EF 35-40% on TEE in 6/15 but then EF back to 60-65% by 10/15 and remained normal on last echo in 8/19.  LHC without significant coronary disease in 3/15.  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 She is back in atrial fibrillation today with volume overload and worsened NYHA class III symptoms.  Suspect atrial fibrillation triggered CHF.  - She is resistant to taking a higher Lasix dose.  Stop Lasix and start torsemide 60 mg daily x 3 days then 40 mg daily after that.  BMET today and again in 10 days.    - Continue lisinopril 10 mg daily.   - Continue spiro  25 mg daily - Continue Toprol 75 mg BID - Will need to try to get her back in NSR.  2. 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  Atrial fibrillation and carpal tunnel syndrome, both of which she has, go along with amyloidosis.  Her history of orthostatic symptoms may also be related to amyloidosis.  - Continue tafamidis. 2. Atrial fibrillation:  Paroxysmal.  Back in atrial fibrillation today with worsening CHF.   - Continue Toprol XL 75 mg bid.  - Continue Eliquis, CBC today.  She has not missed doses.  - Increase amiodarone to 200 mg bid for now. Check LFTs and TSH today. Needs to continue yearly eye exams.  - I will arrange for DCCV.  Risks/benefits discussed with patient and she agrees to procedure.  - Based on size, think she would probably not be a good atrial fibrillation ablation candidate, but would be ideal to keep her out of atrial fibrillation given CHF and tachy-mediated CMP history. Will refer to afib clinic after cardioversion.  - Needs weight loss and sleep study as I suspect she has sleep apnea.  She agrees to sleep study, will arrange.  3. CAD: Mild,  nonobstructive by cath 07/2013 - No ASA given Eliquis use.   - Continue current dose of crestor. Lipids today. 4. Bilateral carpal tunnel symptoms: Likely related to amyloidosis.   Followup in 2 wks with NP/PA.   [name] 06/23/2018

## 2018-06-30 ENCOUNTER — Ambulatory Visit (HOSPITAL_COMMUNITY): Payer: Medicare HMO | Admitting: Anesthesiology

## 2018-06-30 ENCOUNTER — Encounter (HOSPITAL_COMMUNITY)
Admission: RE | Disposition: A | Discharge status: Home / Self Care | Payer: Self-pay | Source: Home / Self Care | Attending: Cardiology

## 2018-06-30 ENCOUNTER — Other Ambulatory Visit: Payer: Self-pay

## 2018-06-30 ENCOUNTER — Other Ambulatory Visit (HOSPITAL_COMMUNITY): Payer: Self-pay | Admitting: *Deleted

## 2018-06-30 ENCOUNTER — Ambulatory Visit (HOSPITAL_COMMUNITY)
Admission: RE | Admit: 2018-06-30 | Discharge: 2018-06-30 | Disposition: A | Discharge status: Home / Self Care | Payer: Medicare HMO | Attending: Cardiology | Admitting: Cardiology

## 2018-06-30 ENCOUNTER — Encounter (HOSPITAL_COMMUNITY): Payer: Self-pay

## 2018-06-30 DIAGNOSIS — E669 Obesity, unspecified: Secondary | ICD-10-CM | POA: Insufficient documentation

## 2018-06-30 DIAGNOSIS — E854 Organ-limited amyloidosis: Secondary | ICD-10-CM | POA: Diagnosis not present

## 2018-06-30 DIAGNOSIS — Z7901 Long term (current) use of anticoagulants: Secondary | ICD-10-CM | POA: Diagnosis not present

## 2018-06-30 DIAGNOSIS — N6320 Unspecified lump in the left breast, unspecified quadrant: Secondary | ICD-10-CM | POA: Insufficient documentation

## 2018-06-30 DIAGNOSIS — Z6841 Body Mass Index (BMI) 40.0 and over, adult: Secondary | ICD-10-CM | POA: Insufficient documentation

## 2018-06-30 DIAGNOSIS — G5603 Carpal tunnel syndrome, bilateral upper limbs: Secondary | ICD-10-CM | POA: Insufficient documentation

## 2018-06-30 DIAGNOSIS — I350 Nonrheumatic aortic (valve) stenosis: Secondary | ICD-10-CM | POA: Insufficient documentation

## 2018-06-30 DIAGNOSIS — I5022 Chronic systolic (congestive) heart failure: Secondary | ICD-10-CM | POA: Insufficient documentation

## 2018-06-30 DIAGNOSIS — I2510 Atherosclerotic heart disease of native coronary artery without angina pectoris: Secondary | ICD-10-CM | POA: Diagnosis not present

## 2018-06-30 DIAGNOSIS — I4891 Unspecified atrial fibrillation: Secondary | ICD-10-CM

## 2018-06-30 DIAGNOSIS — Z79899 Other long term (current) drug therapy: Secondary | ICD-10-CM | POA: Insufficient documentation

## 2018-06-30 DIAGNOSIS — I428 Other cardiomyopathies: Secondary | ICD-10-CM | POA: Diagnosis not present

## 2018-06-30 DIAGNOSIS — I480 Paroxysmal atrial fibrillation: Secondary | ICD-10-CM | POA: Insufficient documentation

## 2018-06-30 HISTORY — PX: CARDIOVERSION: SHX1299

## 2018-06-30 SURGERY — CARDIOVERSION
Anesthesia: General

## 2018-06-30 MED ORDER — PROPOFOL 10 MG/ML IV BOLUS
INTRAVENOUS | Status: DC | PRN
  Administered 2018-06-30: 20 mg via INTRAVENOUS
  Administered 2018-06-30: 80 mg via INTRAVENOUS

## 2018-06-30 MED ORDER — LIDOCAINE HCL (CARDIAC) PF 100 MG/5ML IV SOSY
PREFILLED_SYRINGE | INTRAVENOUS | Status: DC | PRN
  Administered 2018-06-30: 100 mg via INTRATRACHEAL

## 2018-06-30 MED ORDER — SODIUM CHLORIDE 0.9 % IV SOLN
INTRAVENOUS | Status: DC | PRN
  Administered 2018-06-30: 11:00:00 via INTRAVENOUS

## 2018-06-30 NOTE — Anesthesia Procedure Notes (Signed)
Date/Time: 06/30/2018 11:05 AM Performed by: Eligha Bridegroom, CRNA Pre-anesthesia Checklist: Patient identified, Emergency Drugs available, Suction available, Patient being monitored and Timeout performed Oxygen Delivery Method: Ambu bag Preoxygenation: Pre-oxygenation with 100% oxygen Induction Type: IV induction

## 2018-06-30 NOTE — Interval H&P Note (Signed)
History and Physical Interval Note:  06/30/2018 10:53 AM  Kimberly [name]  has presented today for surgery, with the diagnosis of atrial fibrillation  The various methods of treatment have been discussed with the patient and family. After consideration of risks, benefits and other options for treatment, the patient has consented to  Procedure(s): CARDIOVERSION (N/A) as a surgical intervention .  The patient's history has been reviewed, patient examined, no change in status, stable for surgery.  I have reviewed the patient's chart and labs.  Questions were answered to the patient's satisfaction.     [name] [name]

## 2018-06-30 NOTE — Discharge Instructions (Signed)
Electrical Cardioversion, Care After °This sheet gives you information about how to care for yourself after your procedure. Your health care provider may also give you more specific instructions. If you have problems or questions, contact your health care provider. °What can I expect after the procedure? °After the procedure, it is common to have: °· Some redness on the skin where the shocks were given. °Follow these instructions at home: ° °· Do not drive for 24 hours if you were given a medicine to help you relax (sedative). °· Take over-the-counter and prescription medicines only as told by your health care provider. °· Ask your health care provider how to check your pulse. Check it often. °· Rest for 48 hours after the procedure or as told by your health care provider. °· Avoid or limit your caffeine use as told by your health care provider. °Contact a health care provider if: °· You feel like your heart is beating too quickly or your pulse is not regular. °· You have a serious muscle cramp that does not go away. °Get help right away if: ° °· You have discomfort in your chest. °· You are dizzy or you feel faint. °· You have trouble breathing or you are short of breath. °· Your speech is slurred. °· You have trouble moving an arm or leg on one side of your body. °· Your fingers or toes turn cold or blue. °This information is not intended to replace advice given to you by your health care provider. Make sure you discuss any questions you have with your health care provider. °Document Released: 02/23/2013 Document Revised: 12/07/2015 Document Reviewed: 11/09/2015 °Elsevier Interactive Patient Education © 2019 [employer] ° °

## 2018-06-30 NOTE — Procedures (Signed)
Electrical Cardioversion Procedure Note Kanyia Heaslip [id number] [birth year]  Procedure: Electrical Cardioversion Indications:  Atrial Fibrillation  Procedure Details Consent: Risks of procedure as well as the alternatives and risks of each were explained to the (patient/caregiver).  Consent for procedure obtained. Time Out: Verified patient identification, verified procedure, site/side was marked, verified correct patient position, special equipment/implants available, medications/allergies/relevent history reviewed, required imaging and test results available.  Performed  Patient placed on cardiac monitor, pulse oximetry, supplemental oxygen as necessary.  Sedation given: Propofol Pacer pads placed anterior and posterior chest.  Cardioverted 3 times, the 2nd two times with sternal pressure.  Cardioverted at [place].  Evaluation Findings: Post procedure EKG shows: NSR Complications: None Patient did tolerate procedure well.   [name] 06/30/2018, 11:25 AM

## 2018-06-30 NOTE — Transfer of Care (Signed)
Immediate Anesthesia Transfer of Care Note  Patient: Kimberly [name]  Procedure(s) Performed: CARDIOVERSION (N/A )  Patient Location: Endoscopy Unit  Anesthesia Type:General  Level of Consciousness: awake, alert  and oriented  Airway & Oxygen Therapy: Patient Spontanous Breathing and Patient connected to nasal cannula oxygen  Post-op Assessment: Report given to RN and Post -op Vital signs reviewed and stable  Post vital signs: Reviewed and stable  Last Vitals:  Vitals Value Taken Time  BP 104/72 06/30/2018 11:29 AM  Temp 36.4 C 06/30/2018 11:29 AM  Pulse 49 06/30/2018 11:32 AM  Resp 14 06/30/2018 11:32 AM  SpO2 99 % 06/30/2018 11:32 AM  Vitals shown include unvalidated device data.  Last Pain:  Vitals:   06/30/18 1129  TempSrc: Oral  PainSc: 0-No pain         Complications: No apparent anesthesia complications

## 2018-06-30 NOTE — Anesthesia Preprocedure Evaluation (Signed)
Anesthesia Evaluation  Patient identified by MRN, date of birth, ID band Patient awake    Reviewed: Allergy & Precautions, H&P , NPO status , Patient's Chart, lab work & pertinent test results, reviewed documented beta blocker date and time   History of Anesthesia Complications (+) PONV  Airway Mallampati: II  TM Distance: >3 FB Neck ROM: Full    Dental  (+) Dental Advisory Given   Pulmonary sleep apnea ,    Pulmonary exam normal breath sounds clear to auscultation       Cardiovascular hypertension, Pt. on medications and Pt. on home beta blockers +CHF  Normal cardiovascular exam+ dysrhythmias Atrial Fibrillation IV+ Valvular Problems/Murmurs AS  Rhythm:Regular Rate:Normal     Neuro/Psych    GI/Hepatic   Endo/Other  diabetes, Type 2Morbid obesity  Renal/GU      Musculoskeletal   Abdominal (+) + obese,   Peds  Hematology   Anesthesia Other Findings   Reproductive/Obstetrics                            Anesthesia Physical  Anesthesia Plan  ASA: IV  Anesthesia Plan: General   Post-op Pain Management:    Induction: Intravenous  PONV Risk Score and Plan:   Airway Management Planned: Mask  Additional Equipment:   Intra-op Plan:   Post-operative Plan:   Informed Consent: I have reviewed the patients History and Physical, chart, labs and discussed the procedure including the risks, benefits and alternatives for the proposed anesthesia with the patient or authorized representative who has indicated his/her understanding and acceptance.     Dental advisory given  Plan Discussed with:   Anesthesia Plan Comments:         Anesthesia Quick Evaluation

## 2018-06-30 NOTE — Anesthesia Postprocedure Evaluation (Signed)
Anesthesia Post Note  Patient: Kimberly [name]  Procedure(s) Performed: CARDIOVERSION (N/A )     Patient location during evaluation: PACU Anesthesia Type: General Level of consciousness: sedated and patient cooperative Pain management: pain level controlled Vital Signs Assessment: post-procedure vital signs reviewed and stable Respiratory status: spontaneous breathing Cardiovascular status: stable Anesthetic complications: no    Last Vitals:  Vitals:   06/30/18 1129 06/30/18 1139  BP: 104/72 116/63  Pulse: (!) 54 (!) 101  Resp: 18 16  Temp: 36.4 C   SpO2: (!) 17% 100%    Last Pain:  Vitals:   06/30/18 1139  TempSrc:   PainSc: 0-No pain                 Nolon Nations

## 2018-07-01 ENCOUNTER — Encounter (HOSPITAL_COMMUNITY): Payer: Self-pay | Admitting: Cardiology

## 2018-07-02 ENCOUNTER — Telehealth (HOSPITAL_COMMUNITY): Payer: Self-pay | Admitting: *Deleted

## 2018-07-02 NOTE — Telephone Encounter (Signed)
Previous pt of afib.  Referred back for follow up from recent dccv by Dr. Aundra Dubin

## 2018-07-06 NOTE — Progress Notes (Signed)
Patient ID: Kimberly [name], female   DOB: 05-Mar-1956, 63 y.o.   MRN: [id number] PCP: Dr. Gery Pray [facility]) Cardiology: Dr. Bishop Limbo Ryback is a 63 y.o. female with history of paroxysmal atrial fibrillation, obesity, and nonischemic cardiomyopathy presents for cardiology followup.  Patient was admitted in 3/15 at [facility] with atrial fibrillation and CHF.  TEE was done in preparation for DCCV, showing EF 25-30% but there was LA thrombus so DCCV was not done.  Plan was for anticoagulation x 3 months followed by TEE-guided DCCV in the OR (due to patient's body habitus).  LHC was also done, showing mild nonobstructive CAD.  Patient was diuresed with IV Lasix in the hospital and started on apixaban.   Patient had TEE again in 6/15, showing EF 35-40% with diffuse hypokinesis and no LAA thrombus.  She was cardioverted to NSR (with difficulty).  I started her at that time on amiodarone.  She then went back into atrial fibrillation.  After she had been loaded on amiodarone, cardioverted her again in 7/15 to NSR.  She has been back and forth between NSR and atrial fibrillation since that time.    She had gone back into atrial fibrillation in 1/18 and amiodarone was increased to bid in preparation for DCCV. However, she spontaneously converted to NSR.  PYP scan in 4/19 was grade 3, strongly suggestive of TTR amyloidosis.  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  Echo 8/19 showed EF 60%, moderate LVH, mild aortic stenosis.  Of note, she has carpal tunnel syndrome.  She was started on tafamidis.   She returns today for 2 week follow up. She was switched from lasix to torsemide and amiodarone was increased. Found to be in Afib RVR and underwent TEE/DCCV on 2/12. Required 3 shocks. Overall doing a lot better. Weight is down 10 lbs. She is less congested and less SOB. She is able to get around and get in and out of car with no problems. No edema, orthopnea, or PND. No  palpitations. Good UOP with torsemide. Energy level improved. No CP or dizziness. Denies melana or hematuria. Takes all medications. She is back in afib today.   EKG today: Afib 99 bpm.   Labs (4/19): LFTs normal, K 4, creatinine 1.24, TSH normal, immunofixation normal Labs (8/19): TSH normal, K 3.8, creatinine 1.27, LFTs normal, TSH normal Labs (11/19): K 3.8, creatinine 1.21  PMH: 1. Obese 2. Atrial fibrillation: Paroxysmal.  Unable to cardiovert in 3/15 due to LAA thrombus noted on TEE. TEE in 6/15 without LAA thrombus.  Patient had cardioversion to NSR with difficulty but back in atrial fibrillation by 7/15 appt.  She was started on amiodarone and cardioverted in 7/15.  Has been back and forth between AF and NSR since then.  3. H/o TKR 4. Nonischemic cardiomyopathy: TEE (3/15) with EF 25-30%, moderate MR, PA systolic pressure 51 mmHg, moderate to severe TR, LA thrombus was noted.   LHC (3/15) with 50% stenosis small PLV.  TEE (6/15) with EF 35-40%, diffuse hypokinesis, mild MR, moderate TR, +PFO, no LA appendage thrombus.  - Echo (10/15) with EF 60-65%.   - ECHO 06/10/2016 EF 60-65% Mild AS - Echo (8/19): EF 60%, moderate LVH, mild aortic stenosis.  - PYP scan in 4/19 was grade 3, strongly suggestive of transthyretin amyloidosis.  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  5. CAD: Nonobstructive.  LHC (3/15) with 50% stenosis small PLV. 6. PFO 7. Left Breast mass: Was taken for lumpectomy in  1/18 but mass had resolved.  8. Aortic stenosis: Mild.  9. Carpal tunnel syndrome.   SH: Married, lives in [place], nonsmoker  FH: No premature CAD or cardiomyopathy.  Review of systems complete and found to be negative unless listed in HPI.   Current Outpatient Medications  Medication Sig Dispense Refill  . acetaminophen (TYLENOL) 650 MG CR tablet Take 1,300 mg by mouth every 8 (eight) hours as needed for pain.     [name] amiodarone (PACERONE) 200 MG tablet Take 200 mg by mouth 2 (two) times  daily.    . Cholecalciferol (VITAMIN D-3) 5000 UNITS TABS Take 5,000 Units by mouth daily.    [name] ELIQUIS 5 MG TABS tablet TAKE 1 TABLET BY MOUTH TWICE A DAY (Patient taking differently: Take 5 mg by mouth 2 (two) times daily. ) 60 tablet 5  . Emollient (AQUAPHOR EX) Apply 1 application topically every other day.    . Ferrous Sulfate (PX IRON) 27 MG TABS Take 27 mg by mouth 2 (two) times daily.     [name] guaifenesin (ROBITUSSIN) 100 MG/5ML syrup Take 200 mg by mouth at bedtime as needed for cough.    [name] lisinopril (PRINIVIL,ZESTRIL) 10 MG tablet Take 1 tablet (10 mg total) by mouth daily. 90 tablet 2  . metoprolol succinate (TOPROL-XL) 100 MG 24 hr tablet Take 1 tablet (100 mg total) by mouth 2 (two) times daily. 60 tablet 3  . Omega-3 Fatty Acids (FISH OIL) 600 MG CAPS Take 1,200 Units by mouth daily.     [name] Phenylephrine-Acetaminophen 5-325 MG TABS Take 2 tablets by mouth daily as needed (sinuses).    . rosuvastatin (CRESTOR) 40 MG tablet Take 40 mg by mouth at bedtime.  6  . spironolactone (ALDACTONE) 25 MG tablet TAKE 1 TABLET (25 MG TOTAL) BY MOUTH DAILY. (Patient taking differently: Take 25 mg by mouth 2 (two) times daily. ) 90 tablet 1  . Tafamidis (VYNDAMAX) 61 MG CAPS Take 61 mg by mouth daily. 30 capsule   . Tafamidis Meglumine, Cardiac, 20 MG CAPS Take 40 mg by mouth 2 (two) times daily.    [name] torsemide (DEMADEX) 20 MG tablet Take 40 mg by mouth daily.    . nitroGLYCERIN (NITROSTAT) 0.4 MG SL tablet Place 1 tablet (0.4 mg total) under the tongue every 5 (five) minutes as needed for chest pain. (Patient not taking: Reported on 07/07/2018) 30 tablet 1   No current facility-administered medications for this encounter.    Vitals:   07/07/18 1500  BP: (!) 144/92  Pulse: (!) 110  SpO2: 99%  Weight: (!) 163.6 kg (360 lb 9.6 oz)    Wt Readings from Last 3 Encounters:  07/07/18 (!) 163.6 kg (360 lb 9.6 oz)  06/30/18 (!) 168.1 kg (370 lb 9.5 oz)  06/22/18 (!) 168.1 kg (370 lb 9.6 oz)    Physical Exam General: No resp difficulty. Morbidly obese. HEENT: Normal Neck: Supple. JVP does not appear elevated. Carotids 2+ bilat; no bruits. No thyromegaly or nodule noted. Cor: PMI nondisplaced. IRR, No M/G/R noted Lungs: CTAB, normal effort. Abdomen: Soft, non-tender, non-distended, no HSM. No bruits or masses. +BS  Extremities: No cyanosis, clubbing, or rash. R and LLE no edema.  Neuro: Alert & orientedx3, cranial nerves grossly intact. moves all 4 extremities w/o difficulty. Affect pleasant  EKG: Afib 99 bpm.   Assessment/Plan: 1. Chronic systolic CHF: Nonischemic cardiomyopathy.  EF 35-40% on TEE in 6/15 but then EF back to 60-65% by 10/15 and remained normal on last  echo in 8/19.  LHC without significant coronary disease in 3/15.  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  - NYHA II-III, improved - Volume much better. Weight down 10 lbs.  - Continue torsemide 40 mg daily. BMET today - Continue lisinopril 10 mg daily.   - Continue spiro 25 mg daily - Increase Toprol to 100 mg BID - Continue tafamidis for cardiac amyloid. She says she is taking 40 mg BID (there are two different ones on her med list). She will call us when she gets home to confirm.  2. 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  Atrial fibrillation and carpal tunnel syndrome, both of which she has, go along with amyloidosis.  Her history of orthostatic symptoms may also be related to amyloidosis.  - Continue tafamidis. She says she is taking 40 mg BID (there are two different ones on her med list). She will call us when she gets home to confirm.   2. Atrial fibrillation:  Paroxysmal.  - S/p DCCV 2/12, required 3 shocks. Back in Afib today.  - Increase Toprol  to 100 mg BID to help with rate - Continue Eliquis. Hemoglobin trending down, 8.8 on last CBC. Check anemia panel today. Denies bleeding.  - Continue amiodarone 200 mg bid for now. Needs to continue yearly eye exams. Will set her up with Afib clinic. If she remains in Afib, can likely stop amiodarone.  - Based on size, think she would probably not be a good atrial fibrillation ablation candidate, but would be ideal to keep her out of atrial fibrillation given CHF and tachy-mediated CMP history. Refer to afib clinic today.  - Referred for sleep study last visit. She has not yet heard from them.  3. CAD: Mild, nonobstructive by cath 07/2013. No s/s ischemia.  - No ASA given Eliquis use.   - Continue crestor 40 mg daily. LDL 114 06/22/18. 4. Bilateral carpal tunnel symptoms: Likely related to amyloidosis.   Refer to Afib clinic. Increase Toprol as above for rate control Anemia panel, CBC, BMET today.   Georgiana Shore, NP 07/07/2018  Greater than 50% of the 25 minute visit was spent in counseling/coordination of care regarding disease state education, salt/fluid restriction, sliding scale diuretics, and medication compliance.

## 2018-07-07 ENCOUNTER — Encounter (HOSPITAL_COMMUNITY): Payer: Self-pay

## 2018-07-07 ENCOUNTER — Ambulatory Visit (HOSPITAL_COMMUNITY)
Admission: RE | Admit: 2018-07-07 | Discharge: 2018-07-07 | Disposition: A | Discharge status: Home / Self Care | Payer: Medicare HMO | Source: Ambulatory Visit | Attending: Cardiology | Admitting: Cardiology

## 2018-07-07 VITALS — BP 144/92 | HR 99 | Wt 360.6 lb

## 2018-07-07 DIAGNOSIS — I43 Cardiomyopathy in diseases classified elsewhere: Secondary | ICD-10-CM | POA: Insufficient documentation

## 2018-07-07 DIAGNOSIS — G5603 Carpal tunnel syndrome, bilateral upper limbs: Secondary | ICD-10-CM | POA: Diagnosis not present

## 2018-07-07 DIAGNOSIS — I428 Other cardiomyopathies: Secondary | ICD-10-CM | POA: Diagnosis not present

## 2018-07-07 DIAGNOSIS — E854 Organ-limited amyloidosis: Secondary | ICD-10-CM | POA: Diagnosis not present

## 2018-07-07 DIAGNOSIS — Z7901 Long term (current) use of anticoagulants: Secondary | ICD-10-CM | POA: Diagnosis not present

## 2018-07-07 DIAGNOSIS — I5022 Chronic systolic (congestive) heart failure: Secondary | ICD-10-CM | POA: Diagnosis present

## 2018-07-07 DIAGNOSIS — I4891 Unspecified atrial fibrillation: Secondary | ICD-10-CM | POA: Diagnosis not present

## 2018-07-07 DIAGNOSIS — I2510 Atherosclerotic heart disease of native coronary artery without angina pectoris: Secondary | ICD-10-CM | POA: Diagnosis not present

## 2018-07-07 DIAGNOSIS — Z79899 Other long term (current) drug therapy: Secondary | ICD-10-CM | POA: Insufficient documentation

## 2018-07-07 DIAGNOSIS — E669 Obesity, unspecified: Secondary | ICD-10-CM | POA: Insufficient documentation

## 2018-07-07 DIAGNOSIS — I350 Nonrheumatic aortic (valve) stenosis: Secondary | ICD-10-CM | POA: Diagnosis not present

## 2018-07-07 DIAGNOSIS — I480 Paroxysmal atrial fibrillation: Secondary | ICD-10-CM | POA: Diagnosis not present

## 2018-07-07 LAB — CBC
HCT: 34.4 % — ABNORMAL LOW (ref 36.0–46.0)
Hemoglobin: 10.5 g/dL — ABNORMAL LOW (ref 12.0–15.0)
MCH: 27.6 pg (ref 26.0–34.0)
MCHC: 30.5 g/dL (ref 30.0–36.0)
MCV: 90.5 fL (ref 80.0–100.0)
Platelets: 317 10*3/uL (ref 150–400)
RBC: 3.8 MIL/uL — ABNORMAL LOW (ref 3.87–5.11)
RDW: 14 % (ref 11.5–15.5)
WBC: 4.6 10*3/uL (ref 4.0–10.5)
nRBC: 0 % (ref 0.0–0.2)

## 2018-07-07 LAB — BASIC METABOLIC PANEL
Anion gap: 9 (ref 5–15)
BUN: 28 mg/dL — ABNORMAL HIGH (ref 8–23)
CO2: 26 mmol/L (ref 22–32)
Calcium: 9.2 mg/dL (ref 8.9–10.3)
Chloride: 102 mmol/L (ref 98–111)
Creatinine, Ser: 1.78 mg/dL — ABNORMAL HIGH (ref 0.44–1.00)
GFR calc Af Amer: 35 mL/min — ABNORMAL LOW (ref >60)
GFR calc non Af Amer: 30 mL/min — ABNORMAL LOW (ref >60)
Glucose, Bld: 97 mg/dL (ref 70–99)
Potassium: 4.5 mmol/L (ref 3.5–5.1)
Sodium: 137 mmol/L (ref 135–145)

## 2018-07-07 MED ORDER — METOPROLOL SUCCINATE ER 100 MG PO TB24: 100.0000 mg | ORAL_TABLET | Freq: Two times a day (BID) | ORAL | 3 refills | Status: DC

## 2018-07-07 NOTE — Patient Instructions (Addendum)
INCREASE Metoprolol to 100 mg, one tab twice a day PLEASE review your Vyndamax dose at home and call us to clarify [phone] opt 2 Echo have been referred to [facility] for further management of your Atrial Fibrillation. They will be in contact with you for an appointment  Labs today We will only contact you if something comes back abnormal or we need to make some changes. Otherwise no news is good news!   Your physician recommends that you schedule a follow-up appointment in: 2 months with Dr Aundra Dubin

## 2018-07-14 ENCOUNTER — Encounter (HOSPITAL_COMMUNITY): Payer: Self-pay | Admitting: Nurse Practitioner

## 2018-07-14 ENCOUNTER — Ambulatory Visit (HOSPITAL_COMMUNITY)
Admission: RE | Admit: 2018-07-14 | Discharge: 2018-07-14 | Disposition: A | Discharge status: Home / Self Care | Payer: Medicare HMO | Source: Ambulatory Visit | Attending: Nurse Practitioner | Admitting: Nurse Practitioner

## 2018-07-14 VITALS — BP 118/74 | HR 101 | Ht 66.0 in | Wt 365.0 lb

## 2018-07-14 DIAGNOSIS — I4891 Unspecified atrial fibrillation: Secondary | ICD-10-CM | POA: Insufficient documentation

## 2018-07-14 LAB — BASIC METABOLIC PANEL
Anion gap: 7 (ref 5–15)
BUN: 21 mg/dL (ref 8–23)
CO2: 26 mmol/L (ref 22–32)
Calcium: 9.3 mg/dL (ref 8.9–10.3)
Chloride: 106 mmol/L (ref 98–111)
Creatinine, Ser: 1.76 mg/dL — ABNORMAL HIGH (ref 0.44–1.00)
GFR calc Af Amer: 36 mL/min — ABNORMAL LOW (ref >60)
GFR calc non Af Amer: 31 mL/min — ABNORMAL LOW (ref >60)
Glucose, Bld: 93 mg/dL (ref 70–99)
Potassium: 4.5 mmol/L (ref 3.5–5.1)
Sodium: 139 mmol/L (ref 135–145)

## 2018-07-15 ENCOUNTER — Telehealth (HOSPITAL_COMMUNITY): Payer: Self-pay

## 2018-07-15 NOTE — Telephone Encounter (Signed)
Spoke with patient regarding lab results. Pt advised to resume Torsemide 20mg  daily.  Pt will repeat labs on 3/9. Pt states understanding.

## 2018-07-15 NOTE — Progress Notes (Signed)
Primary Care Physician: Windell Hummingbird, PA-C Referring Physician: Dr. Linus Mako, NP    Kimberly [name] is a 63 y.o. female with a h/o h/o of afib on amiodarone which has waxed and waned for years. She also has h/o of morbid obesity, amyloidosis,  CHF, HTN, DM. She was seen by Dr. Aundra Dubin the first of February and found to  be in afib. She also was c/o being short of breath and had some fluid overload. She was changed form lasix to torsemide, amiodarone was increased and she had successful cardioversion 06/30/18. However, on f/u in HF clinic 2/19 she had returned to afib. She was asked to f/u in the afib clinic as it is felt to be optimal for pt to be in  SR with HF dx. Pt was evaluated by Dr. Rayann Heman 01/18/14 and determined that she was not a good candidate for ablation 2/2 morbid obesity. She was encouraged to lose wight and if she could get below 300 lbs, he would consider it. Pt states that she snores and a sleep study has been ordered.   We discussed wash out of amiodarone and loading Tikosyn no sooner than 3 months from now. Tikosyn precautions discussed. Pt  states that she is not one to be very consistent with her mediations and I have concerns if she would be compliant to take Tikosyn on a regimented manner.   She also says that she stopped taking her torsemide one week ago as she feels it made her abdomen and back hurt. Otherwise, she does not feel the afib and states that she feels good.  Today, she denies symptoms of palpitations, chest pain, shortness of breath, orthopnea, PND, lower extremity edema, dizziness, presyncope, syncope, or neurologic sequela. The patient is tolerating medications without difficulties and is otherwise without complaint today.   Past Medical History:  Diagnosis Date  . Aortic stenosis   . Arthritis    KNEES  . Atrial fibrillation ([place])   . Complication of anesthesia   . Diabetes ([place])   . HLD (hyperlipidemia)   . Left atrial thrombus   . Morbid  obesity ([place])   . NICM (nonischemic cardiomyopathy) ([place])    EF 30%  . PONV (postoperative nausea and vomiting)    Past Surgical History:  Procedure Laterality Date  . CARDIOVERSION N/A 07/29/2013   Procedure: CARDIOVERSION;  Surgeon: [name], MD;  Location: [facility] ENDOSCOPY;  Service: Cardiovascular;  Laterality: N/A;  . CARDIOVERSION N/A 11/11/2013   Procedure: CARDIOVERSION;  Surgeon: [name], MD;  Location: [facility] ENDOSCOPY;  Service: Cardiovascular;  Laterality: N/A;  . CARDIOVERSION N/A 12/08/2013   Procedure: CARDIOVERSION;  Surgeon: [name], MD;  Location: [facility] ENDOSCOPY;  Service: Cardiovascular;  Laterality: N/A;  . CARDIOVERSION N/A 06/30/2018   Procedure: CARDIOVERSION;  Surgeon: [name], MD;  Location: [facility] ENDOSCOPY;  Service: Cardiovascular;  Laterality: N/A;  . JOINT REPLACEMENT    . LEFT HEART CATHETERIZATION WITH CORONARY ANGIOGRAM N/A 08/01/2013   Procedure: LEFT HEART CATHETERIZATION WITH CORONARY ANGIOGRAM;  Surgeon: Blane Ohara, MD;  Location: [facility] CATH LAB;  Service: Cardiovascular;  Laterality: N/A;  . TEE WITHOUT CARDIOVERSION N/A 07/29/2013   Procedure: TRANSESOPHAGEAL ECHOCARDIOGRAM (TEE);  Surgeon: [name], MD;  Location: [place];  Service: Cardiovascular;  Laterality: N/A;  . TEE WITHOUT CARDIOVERSION N/A 11/11/2013   Procedure: TRANSESOPHAGEAL ECHOCARDIOGRAM (TEE);  Surgeon: [name], MD;  Location: [facility] ENDOSCOPY;  Service: Cardiovascular;  Laterality: N/A;    Current Outpatient Medications  Medication Sig Dispense Refill  . acetaminophen (TYLENOL) 650 MG CR tablet Take 1,300 mg by mouth every 8 (eight) hours as needed for pain.     [name] amiodarone (PACERONE) 200 MG tablet Take 200 mg by mouth 2 (two) times daily.    . Cholecalciferol (VITAMIN D-3) 5000 UNITS TABS Take 5,000 Units by mouth daily.    [name] ELIQUIS 5 MG TABS tablet TAKE 1 TABLET BY MOUTH TWICE A DAY (Patient taking differently: Take 5 mg by mouth 2 (two) times  daily. ) 60 tablet 5  . Emollient (AQUAPHOR EX) Apply 1 application topically every other day.    . Ferrous Sulfate (PX IRON) 27 MG TABS Take 27 mg by mouth 2 (two) times daily.     [name] guaifenesin (ROBITUSSIN) 100 MG/5ML syrup Take 200 mg by mouth at bedtime as needed for cough.    [name] lisinopril (PRINIVIL,ZESTRIL) 10 MG tablet Take 1 tablet (10 mg total) by mouth daily. 90 tablet 2  . metoprolol succinate (TOPROL-XL) 100 MG 24 hr tablet Take 1 tablet (100 mg total) by mouth 2 (two) times daily. 60 tablet 3  . nitroGLYCERIN (NITROSTAT) 0.4 MG SL tablet Place 1 tablet (0.4 mg total) under the tongue every 5 (five) minutes as needed for chest pain. 30 tablet 1  . Omega-3 Fatty Acids (FISH OIL) 600 MG CAPS Take 1,200 Units by mouth daily.     [name] Phenylephrine-Acetaminophen 5-325 MG TABS Take 2 tablets by mouth daily as needed (sinuses).    . rosuvastatin (CRESTOR) 40 MG tablet Take 40 mg by mouth at bedtime.  6  . spironolactone (ALDACTONE) 25 MG tablet TAKE 1 TABLET (25 MG TOTAL) BY MOUTH DAILY. (Patient taking differently: Take 25 mg by mouth 2 (two) times daily. ) 90 tablet 1  . Tafamidis Meglumine, Cardiac, 20 MG CAPS Take 40 mg by mouth 2 (two) times daily.    [name] torsemide (DEMADEX) 20 MG tablet Take 20 mg by mouth daily.      No current facility-administered medications for this encounter.     No Known Allergies  Social History   Socioeconomic History  . Marital status: Single    Spouse name: Not on file  . Number of children: Not on file  . Years of education: Not on file  . Highest education level: Not on file  Occupational History  . Not on file  Social Needs  . Financial resource strain: Not on file  . Food insecurity:    Worry: Not on file    Inability: Not on file  . Transportation needs:    Medical: Not on file    Non-medical: Not on file  Tobacco Use  . Smoking status: Never Smoker  . Smokeless tobacco: Never Used  Substance and Sexual Activity  . Alcohol use: No     Alcohol/week: 0.0 standard drinks  . Drug use: No  . Sexual activity: Not Currently  Lifestyle  . Physical activity:    Days per week: Not on file    Minutes per session: Not on file  . Stress: Not on file  Relationships  . Social connections:    Talks on phone: Not on file    Gets together: Not on file    Attends religious service: Not on file    Active member of club or organization: Not on file    Attends meetings of clubs or organizations: Not on file    Relationship status: Not on file  . Intimate partner violence:  Fear of current or ex partner: Not on file    Emotionally abused: Not on file    Physically abused: Not on file    Forced sexual activity: Not on file  Other Topics Concern  . Not on file  Social History Narrative  . Not on file    Family History  Problem Relation Age of Onset  . CVA Mother     ROS- All systems are reviewed and negative except as per the HPI above  Physical Exam: Vitals:   07/14/18 1446  BP: 118/74  Pulse: (!) 101  Weight: (!) 165.6 kg  Height: 5\' 6"  (1.676 m)   Wt Readings from Last 3 Encounters:  07/14/18 (!) 165.6 kg  07/07/18 (!) 163.6 kg  06/30/18 (!) 168.1 kg    Labs: Lab Results  Component Value Date   NA 139 07/14/2018   K 4.5 07/14/2018   CL 106 07/14/2018   CO2 26 07/14/2018   GLUCOSE 93 07/14/2018   BUN 21 07/14/2018   CREATININE 1.76 (H) 07/14/2018   CALCIUM 9.3 07/14/2018   Lab Results  Component Value Date   INR 1.28 12/08/2013   Lab Results  Component Value Date   CHOL 183 06/22/2018   HDL 59 06/22/2018   LDLCALC 114 (H) 06/22/2018   TRIG 49 06/22/2018     GEN- The patient is well appearing, alert and oriented x 3 today.   Head- normocephalic, atraumatic Eyes-  Sclera clear, conjunctiva pink Ears- hearing intact Oropharynx- clear Neck- supple, no JVP Lymph- no cervical lymphadenopathy Lungs- Clear to ausculation bilaterally, normal work of breathing Heart- irregular rate and rhythm, no  murmurs, rubs or gallops, PMI not laterally displaced GI- soft, NT, ND, + BS Extremities- no clubbing, cyanosis, or edema MS- no significant deformity or atrophy Skin- no rash or lesion Psych- euthymic mood, full affect Neuro- strength and sensation are intact  EKG-afib at 1010 bpm Epic records reviewed   Assessment and Plan: 1. Persistent afib Unfortunately do not see a good option to return pt to SR She had a successful cardioversion 06/30/18, after a short reload of amiodarone, but within the week was back in afib She is not an ablation candidate 2/2 her morbid obesity Tiksoyn may be an option, after a 3 month washout, but pt was very reluctant to the idea and by her own admission is non compliant with her meds at times I do not know if I could trust her to take Tikosyn on a regimented routine She states that she is pending a sleep study and if she does have sleep apnea, this could be contributing to her afib burden If she can tolerate cpap, it may be worth to reload on amiodarone after a period on cpap and try to cardiovert at that time For now she will go back to taking amiodarone daily  2. Abdominal/back pain Pt believes it is from torsemide but has been off a week and still having symptoms Bmet drawn today continues with elevation of creatinine at 1.76 Per Dr. Aundra Dubin, he wants her to go back to 20 mg torsemide daily and have bmet rechecked in 10 days. Will get HF clinic to coordinate.  afib clinic as needed  Geroge Baseman. Josalin Carneiro, [facility] [address] [place], [place] [address] [phone]

## 2018-07-15 NOTE — Telephone Encounter (Signed)
-----   Message from [name], MD sent at 07/15/2018  1:56 PM EST ----- She needs to go back on torsemide but will lower dose to 20 mg daily.  Repeat BMET in 10 days.

## 2018-07-21 ENCOUNTER — Other Ambulatory Visit (HOSPITAL_COMMUNITY): Payer: Medicare HMO

## 2018-07-26 ENCOUNTER — Other Ambulatory Visit (HOSPITAL_COMMUNITY): Payer: Medicare HMO

## 2018-08-02 ENCOUNTER — Other Ambulatory Visit: Payer: Self-pay

## 2018-08-02 ENCOUNTER — Ambulatory Visit (HOSPITAL_COMMUNITY)
Admission: RE | Admit: 2018-08-02 | Discharge: 2018-08-02 | Disposition: A | Discharge status: Home / Self Care | Payer: Medicare HMO | Source: Ambulatory Visit | Attending: Cardiology | Admitting: Cardiology

## 2018-08-02 DIAGNOSIS — I5022 Chronic systolic (congestive) heart failure: Secondary | ICD-10-CM | POA: Diagnosis present

## 2018-08-02 LAB — BASIC METABOLIC PANEL
Anion gap: 8 (ref 5–15)
BUN: 22 mg/dL (ref 8–23)
CO2: 27 mmol/L (ref 22–32)
Calcium: 9.6 mg/dL (ref 8.9–10.3)
Chloride: 105 mmol/L (ref 98–111)
Creatinine, Ser: 1.42 mg/dL — ABNORMAL HIGH (ref 0.44–1.00)
GFR calc Af Amer: 46 mL/min — ABNORMAL LOW (ref >60)
GFR calc non Af Amer: 40 mL/min — ABNORMAL LOW (ref >60)
Glucose, Bld: 105 mg/dL — ABNORMAL HIGH (ref 70–99)
Potassium: 4.4 mmol/L (ref 3.5–5.1)
Sodium: 140 mmol/L (ref 135–145)

## 2018-08-02 LAB — FERRITIN: Ferritin: 27 ng/mL (ref 11–307)

## 2018-08-02 LAB — VITAMIN B12: Vitamin B-12: 462 pg/mL (ref 180–914)

## 2018-08-02 LAB — IRON AND TIBC
Iron: 67 ug/dL (ref 28–170)
Saturation Ratios: 15 % (ref 10.4–31.8)
TIBC: 449 ug/dL (ref 250–450)
UIBC: 382 ug/dL

## 2018-08-02 LAB — FOLATE: Folate: 12 ng/mL (ref >5.9)

## 2018-08-02 NOTE — Addendum Note (Signed)
Encounter addended by: Kerry Dory, CMA on: 08/02/2018 9:46 AM  Actions taken: Order list changed, Diagnosis association updated

## 2018-09-06 ENCOUNTER — Other Ambulatory Visit: Payer: Self-pay

## 2018-09-06 ENCOUNTER — Telehealth (HOSPITAL_COMMUNITY): Payer: Self-pay

## 2018-09-06 ENCOUNTER — Ambulatory Visit (HOSPITAL_COMMUNITY)
Admission: RE | Admit: 2018-09-06 | Discharge: 2018-09-06 | Disposition: A | Discharge status: Home / Self Care | Payer: Medicare HMO | Source: Ambulatory Visit | Attending: Cardiology | Admitting: Cardiology

## 2018-09-06 DIAGNOSIS — I5022 Chronic systolic (congestive) heart failure: Secondary | ICD-10-CM

## 2018-09-06 DIAGNOSIS — I4891 Unspecified atrial fibrillation: Secondary | ICD-10-CM

## 2018-09-06 NOTE — Progress Notes (Signed)
Heart Failure TeleHealth Note  Due to national recommendations of social distancing due to [place] 19, Audio/video telehealth visit is felt to be most appropriate for this patient at this time.  See MyChart message from today for patient consent regarding telehealth for [facility].  Date:  09/06/2018   ID:  Kimberly [name], DOB 02-28-1956, MRN [id number]  Location: Home  Provider location: [place] [facility] Type of Visit: Established patient   PCP:  Windell Hummingbird, PA-C  Cardiologist:  Dr. Aundra Dubin  Chief Complaint: Shortness of breath   History of Present Illness: Kimberly [name] is a 63 y.o. female who presents via audio/video conferencing for a telehealth visit today.   We attempted to do a video visit, but due to poor connection we converted over to phone only.   she denies symptoms worrisome for COVID 19.   Patient has a history of paroxysmal atrial fibrillation, obesity, and nonischemic cardiomyopathy.  She was admitted in 3/15 at [facility] with atrial fibrillation and CHF.  TEE was done in preparation for DCCV, showing EF 25-30% but there was LA thrombus so DCCV was not done.  Plan was for anticoagulation x 3 months followed by TEE-guided DCCV in the OR (due to patient's body habitus).  LHC was also done, showing mild nonobstructive CAD.  Patient was diuresed with IV Lasix in the hospital and started on apixaban.   Patient had TEE again in 6/15, showing EF 35-40% with diffuse hypokinesis and no LAA thrombus.  She was cardioverted to NSR (with difficulty).  I started her at that time on amiodarone.  She then went back into atrial fibrillation.  After she had been loaded on amiodarone, cardioverted her again in 7/15 to NSR.  She has been back and forth between NSR and atrial fibrillation since that time.    She had gone back into atrial fibrillation in 1/18 and amiodarone was increased to bid in preparation for DCCV. However, she spontaneously converted to NSR.   PYP scan in 4/19 was grade 3, strongly suggestive of TTR amyloidosis.  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  Echo 8/19 showed EF 60%, moderate LVH, mild aortic stenosis.  Of note, she has carpal tunnel syndrome.  She was started on tafamidis.   She went back into atrial fibrillation and had DCCV in 2/20 but was back in atrial fibrillation a few weeks later when she went to atrial fibrillation clinic.  Dr. Rayann Heman thought that morbid obesity precluded atrial fibrillation ablation and concerns about compliance led them not to start her on Tikosyn.  Instead, it was recommended that she start on CPAP for her OSA, lose weight, and re-attempt DCCV while on amiodarone.   She has lost 9-10 lbs since her last appointment.  She has not had a sleep study yet.  No dyspnea walking in the house, main problem is bilateral knee pain.  No orthopnea/PND.  She does not feel palpitations.  No chest pain.  No lightheadedness.  She is currently taking torsemide 20 mg daily.  Labs (4/19): LFTs normal, K 4, creatinine 1.24, TSH normal, immunofixation normal Labs (8/19): TSH normal, K 3.8, creatinine 1.27, LFTs normal, TSH normal Labs (11/19): K 3.8, creatinine 1.21 Labs (2/20): LDL 114, hgb 10.5 Labs (3/20): K 4.4, creatinine 1.42  PMH: 1. Obese 2. Atrial fibrillation: Paroxysmal.  Unable to cardiovert in 3/15 due to LAA thrombus noted on TEE. TEE in 6/15 without LAA thrombus.  Patient had cardioversion to NSR  with difficulty but back in atrial fibrillation by 7/15 appt.  She was started on amiodarone and cardioverted in 7/15.  Has been back and forth between AF and NSR since then.  - DCCV in 2/20 but back in atrial fibrillation by 3/20.  3. H/o TKR 4. Nonischemic cardiomyopathy: TEE (3/15) with EF 25-30%, moderate MR, PA systolic pressure 51 mmHg, moderate to severe TR, LA thrombus was noted.   LHC (3/15) with 50% stenosis small PLV.  TEE (6/15) with EF 35-40%, diffuse hypokinesis,  mild MR, moderate TR, +PFO, no LA appendage thrombus.  - Echo (10/15) with EF 60-65%.   - ECHO 06/10/2016 EF 60-65% Mild AS - Echo (8/19): EF 60%, moderate LVH, mild aortic stenosis.  - PYP scan in 4/19 was grade 3, strongly suggestive of transthyretin amyloidosis.  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  5. CAD: Nonobstructive.  LHC (3/15) with 50% stenosis small PLV. 6. PFO 7. Left Breast mass: Was taken for lumpectomy in 1/18 but mass had resolved.  8. Aortic stenosis: Mild.  9. Carpal tunnel syndrome.   Current Outpatient Medications  Medication Sig Dispense Refill  . acetaminophen (TYLENOL) 650 MG CR tablet Take 1,300 mg by mouth every 8 (eight) hours as needed for pain.     [name] amiodarone (PACERONE) 200 MG tablet Take 200 mg by mouth 2 (two) times daily.    . Cholecalciferol (VITAMIN D-3) 5000 UNITS TABS Take 5,000 Units by mouth daily.    [name] ELIQUIS 5 MG TABS tablet TAKE 1 TABLET BY MOUTH TWICE A DAY (Patient taking differently: Take 5 mg by mouth 2 (two) times daily. ) 60 tablet 5  . Emollient (AQUAPHOR EX) Apply 1 application topically every other day.    . Ferrous Sulfate (PX IRON) 27 MG TABS Take 27 mg by mouth 2 (two) times daily.     [name] guaifenesin (ROBITUSSIN) 100 MG/5ML syrup Take 200 mg by mouth at bedtime as needed for cough.    [name] lisinopril (PRINIVIL,ZESTRIL) 10 MG tablet Take 1 tablet (10 mg total) by mouth daily. 90 tablet 2  . metoprolol succinate (TOPROL-XL) 100 MG 24 hr tablet Take 1 tablet (100 mg total) by mouth 2 (two) times daily. 60 tablet 3  . nitroGLYCERIN (NITROSTAT) 0.4 MG SL tablet Place 1 tablet (0.4 mg total) under the tongue every 5 (five) minutes as needed for chest pain. 30 tablet 1  . Omega-3 Fatty Acids (FISH OIL) 600 MG CAPS Take 1,200 Units by mouth daily.     [name] Phenylephrine-Acetaminophen 5-325 MG TABS Take 2 tablets by mouth daily as needed (sinuses).    . rosuvastatin (CRESTOR) 40 MG tablet Take 40 mg by mouth at bedtime.  6  . spironolactone  (ALDACTONE) 25 MG tablet TAKE 1 TABLET (25 MG TOTAL) BY MOUTH DAILY. (Patient taking differently: Take 25 mg by mouth 2 (two) times daily. ) 90 tablet 1  . Tafamidis Meglumine, Cardiac, 20 MG CAPS Take 40 mg by mouth 2 (two) times daily.    [name] torsemide (DEMADEX) 20 MG tablet Take 20 mg by mouth daily.      No current facility-administered medications for this encounter.     Allergies:   Patient has no known allergies.   Social History:  The patient  reports that she has never smoked. She has never used smokeless tobacco. She reports that she does not drink alcohol or use drugs.   Family History:  The patient's family history includes CVA in her mother.   ROS:  Please  see the history of present illness.   All other systems are personally reviewed and negative.   Exam:  (Video/Tele Health Call; Exam is subjective and or/visual.) BP 116/79, HR 80  General:  Speaks in full sentences. No resp difficulty. Lungs: Normal respiratory effort with conversation.  Abdomen: Non-distended per patient report Extremities: Pt denies edema. Neuro: Alert & oriented x 3.   Recent Labs: 06/22/2018: ALT 17; TSH 0.751 07/07/2018: Hemoglobin 10.5; Platelets 317 08/02/2018: BUN 22; Creatinine, Ser 1.42; Potassium 4.4; Sodium 140  Personally reviewed   Wt Readings from Last 3 Encounters:  07/14/18 (!) 165.6 kg (365 lb)  07/07/18 (!) 163.6 kg (360 lb 9.6 oz)  06/30/18 (!) 168.1 kg (370 lb 9.5 oz)     ASSESSMENT AND PLAN:  1. Chronic systolic CHF: Nonischemic cardiomyopathy.  EF 35-40% on TEE in 6/15 but then EF back to 60-65% by 10/15 and remained normal on last echo in 8/19.  LHC without significant coronary disease in 3/15.  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  Weight is down and symptoms are stable, probably NYHA class III.   - Continue torsemide 20 mg daily. BMET  today.  - Continue lisinopril 10 mg daily.   - Continue spiro 25 mg daily - Continue Toprol XL 100 mg BID - Continue tafamidis. 2. 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  Atrial fibrillation and carpal tunnel syndrome, both of which she has, go along with amyloidosis.  Her history of orthostatic symptoms may also be related to amyloidosis.  - Continue tafamidis.    2. Atrial fibrillation: Last DCCV in 2/20 but back in atrial fibrillation since then.  Dr. Rayann Heman saw, decided against atrial fibrillation ablation due to morbid obesity.  They decided against Tikosyn due to compliance concerns. Current recommendation is to continue amiodarone, start CPAP, and retry DCCV while compliant with CPAP.  - Good rate control on Toprol XL.  - Continue Eliquis.  - Continue amiodarone 200 mg daily.  Check LFTs today and will need regular eye exam.   3. CAD: Mild, nonobstructive by cath 07/2013. No chest pain.  - No ASA given Eliquis use.   - Continue crestor 40 mg daily.  4. Bilateral carpal tunnel symptoms: Likely related to amyloidosis.  5. Suspect OSA: Daytime sleepiness, snoring, morbid obesity.  We will arrange for home sleep study.  Needs OSA treatment as I suspect that it is present and is perpetuating atrial fibrillation.   COVID screen The patient does not have any symptoms that suggest any further testing/ screening at this time.  Social distancing reinforced today.  Patient Risk: After full review of this patients clinical status, I feel that they are at moderate risk for cardiac decompensation at this time.  Relevant cardiac medications were reviewed at length with the patient today. The patient does not have concerns regarding their medications at this time.   Recommended follow-up:  3 months.   Today, I have spent 22 minutes with the patient with telehealth technology  discussing the above issues .    Signed, [name], MD  09/06/2018 9:50 PM  [facility] [address] [facility] [place] [address] [phone] (office) [phone] (fax)

## 2018-09-06 NOTE — Telephone Encounter (Signed)
Reviewed AVS:      1. Arrange for CMET /4/23 @1015  2. Needs to get set up for home sleep study./forwarded to [employer] to put on list 3. Followup in 3 months. /7/27 @1 :40

## 2018-09-06 NOTE — Patient Instructions (Signed)
    1. Arrange for CMET /4/23 @1015  2. Needs to get set up for home sleep study./forwarded to [employer] to put on list 3. Followup in 3 months. /7/27 @1 :40

## 2018-09-08 ENCOUNTER — Encounter (HOSPITAL_COMMUNITY): Payer: Medicare HMO

## 2018-09-09 ENCOUNTER — Other Ambulatory Visit: Payer: Self-pay

## 2018-09-09 ENCOUNTER — Ambulatory Visit (HOSPITAL_COMMUNITY)
Admission: RE | Admit: 2018-09-09 | Discharge: 2018-09-09 | Disposition: A | Discharge status: Home / Self Care | Payer: Medicare HMO | Source: Ambulatory Visit | Attending: Cardiology | Admitting: Cardiology

## 2018-09-09 DIAGNOSIS — I5022 Chronic systolic (congestive) heart failure: Secondary | ICD-10-CM

## 2018-09-09 LAB — BASIC METABOLIC PANEL
Anion gap: 12 (ref 5–15)
BUN: 17 mg/dL (ref 8–23)
CO2: 26 mmol/L (ref 22–32)
Calcium: 9.6 mg/dL (ref 8.9–10.3)
Chloride: 102 mmol/L (ref 98–111)
Creatinine, Ser: 1.23 mg/dL — ABNORMAL HIGH (ref 0.44–1.00)
GFR calc Af Amer: 55 mL/min — ABNORMAL LOW (ref >60)
GFR calc non Af Amer: 47 mL/min — ABNORMAL LOW (ref >60)
Glucose, Bld: 104 mg/dL — ABNORMAL HIGH (ref 70–99)
Potassium: 4.2 mmol/L (ref 3.5–5.1)
Sodium: 140 mmol/L (ref 135–145)

## 2018-09-14 ENCOUNTER — Other Ambulatory Visit: Payer: Self-pay | Admitting: Internal Medicine

## 2018-09-15 ENCOUNTER — Other Ambulatory Visit (HOSPITAL_COMMUNITY): Payer: Self-pay | Admitting: Cardiology

## 2018-09-17 ENCOUNTER — Other Ambulatory Visit (HOSPITAL_COMMUNITY): Payer: Self-pay | Admitting: Cardiology

## 2018-09-18 ENCOUNTER — Other Ambulatory Visit (HOSPITAL_COMMUNITY): Payer: Self-pay | Admitting: Cardiology

## 2018-09-20 ENCOUNTER — Other Ambulatory Visit (HOSPITAL_COMMUNITY): Payer: Self-pay

## 2018-09-20 MED ORDER — METOPROLOL SUCCINATE ER 100 MG PO TB24: 100.0000 mg | ORAL_TABLET | Freq: Two times a day (BID) | ORAL | 3 refills | Status: DC

## 2018-09-24 ENCOUNTER — Telehealth (HOSPITAL_COMMUNITY): Payer: Self-pay

## 2018-09-24 NOTE — Telephone Encounter (Signed)
lmtrc about stop bang questions  

## 2018-09-27 ENCOUNTER — Telehealth (HOSPITAL_COMMUNITY): Payer: Self-pay

## 2018-09-27 NOTE — Telephone Encounter (Addendum)
  Height:5'6     Weight:365lbs BMI:58.91 STOP BANG RISK ASSESSMENT S (snore) Have you been told that you snore?     YES   T (tired) Are you often tired, fatigued, or sleepy during the day?   NO  O (obstruction) Do you stop breathing, choke, or gasp during sleep? NO   P (pressure) Do you have or are you being treated for high blood pressure? NO   B (BMI) Is your body index greater than 35 kg/m? YES   A (age) Are you 62 years old or older? YES   N (neck) Do you have a neck circumference greater than 16 inches?   YES/NO   G (gender) Are you a female? NO   TOTAL STOP/BANG "YES" ANSWERS 3                                                                       For Office Use Only              Procedure Order Form    YES to 3+ Stop Bang questions OR two clinical symptoms - patient qualifies for WatchPAT (CPT 95800)     Submit: This Form + Patient Face Sheet + Clinical Note via CloudPAT or Fax: [phone]         Clinical Notes: Will consult Sleep Specialist and refer for management of therapy due to patient increased risk of Sleep Apnea. Ordering a sleep study due to the following two clinical symptoms: Excessive daytime sleepiness G47.10 / Gastroesophageal reflux K21.9 / Nocturia R35.1 / Morning Headaches G44.221 / Difficulty concentrating R41.840 / Memory problems or poor judgment G31.84 / Personality changes or irritability R45.4 / Loud snoring R06.83 / Depression F32.9 / Unrefreshed by sleep G47.8 / Impotence N52.9 / History of high blood pressure R03.0 / Insomnia G47.00

## 2018-09-28 ENCOUNTER — Telehealth (HOSPITAL_COMMUNITY): Payer: Self-pay | Admitting: Surgery

## 2018-09-28 ENCOUNTER — Other Ambulatory Visit (HOSPITAL_COMMUNITY): Payer: Self-pay | Admitting: Surgery

## 2018-09-28 NOTE — Telephone Encounter (Signed)
Order for home sleep study, demographics, Stop Bang and OV note information sent to [facility] per protocol.

## 2018-10-21 ENCOUNTER — Telehealth (HOSPITAL_COMMUNITY): Payer: Self-pay | Admitting: Licensed Clinical Social Worker

## 2018-10-21 NOTE — Telephone Encounter (Signed)
CSW received call from pt with concerns regarding sleep study.  Pt states she was sent home sleep study equipment but has not done the study yet because she is having trouble falling asleep due to tendonitis of the ear.  States that this makes it hard for her to fall asleep and sometimes she can't get to sleep until 2-3am.  CSW discussed with RN who reports that pt just needs to put it on when she starts trying to go to sleep so that the device can record whats happening when she is able to go to sleep- CSW updated patient.  Patient also inquiring about how to set up app on her phone- CSW directed her to the customer service number on the equipment so they can walk patient through the process.  Jorge Ny, LCSW Clinical Social Worker [facility] Desk#: [phone] Cell#: [phone]

## 2018-12-01 ENCOUNTER — Encounter (INDEPENDENT_AMBULATORY_CARE_PROVIDER_SITE_OTHER): Payer: Medicare HMO | Admitting: Cardiology

## 2018-12-01 DIAGNOSIS — G4733 Obstructive sleep apnea (adult) (pediatric): Secondary | ICD-10-CM | POA: Diagnosis not present

## 2018-12-07 ENCOUNTER — Other Ambulatory Visit (HOSPITAL_COMMUNITY): Payer: Self-pay | Admitting: Cardiology

## 2018-12-07 DIAGNOSIS — I4891 Unspecified atrial fibrillation: Secondary | ICD-10-CM

## 2018-12-08 NOTE — Procedures (Signed)
    Sleep Study Report  Patient Information First Name: Kimberly Last Name: [name]  ID: [id number] Birth Date: 24-Sep-1955  Age: 63  Gender: Female  Sleep Study Information Study Date:12/01/2018 Referring Physician:  [name], MD  Summary & Diagnosis TEST DESCRIPTION: Home sleep apnea testing was completed using the WatchPat, a Type 1 device, utilizing peripheral arterial tonometry (PAT), chest movement, actigraphy, pulse oximetry, pulse rate, body position and snore AHI was calculated with apnea and hypopnea using valid sleep time as the denominator. RDI includes apneas, hypopneas, and RERAs. The data acquired and the scoring of sleep and all associated events were performed in accordance with the recommended standards and specifications as outlined in the AASM Manual for the Scoring of Sleep and Associated Events 2.2.0 (2015).  DIAGNOSIS: 1. Severe Obstructive Sleep Apnea (G47.33) with AHI 29.5/hr. 2. Moderate Central Sleep Apnea with pAHIc 20.7/hr. 27.7% of apneas were [place] Stokes Respirations. 3. Nocturnal hypoxemia with lowest O2 sat 65%. Time with O2sats < 88% was 42.4 min. 4. The average heart rate was 76bpm and ranged from 43 to 130bpm 5. The patient slept for 5 hours and 8 min with 81% of time spent in the supine position. 6. Shortened sleep onset latency at 6 minutes. 7. Prolonged REM sleep latency at 175 minutes.  Recommendations 1. Recommend continuous positive airway pressure (CPAP) as the initial treatment of choice for severe obstructive sleep apnea. Given severity of sleep disordered breathing with [place] Stokes Respirations, significant central sleep apnea and severe nocturnal hypoemia, recommend nocturnal PSG with CPAP titration in the sleep lab.  2. Positive airway pressure therapy (PAP) is the first line of treatment for patients with severe OSA. Alternative treatment for severe OSA in patients who cannot tolerate and have failed or refused CPAP therapy  includes :   a. The patient may benefit from the use of nocturnal mandibular repositioning appliance. If that line of therapy is to be pursed, the patient should be evaluated by a dentist trained in the treatment of sleep related breathing disorders.   b. An ENT consultation which may be useful to look for specific causes of obstruction and possible treatment options.   c. If patient is intolerant to PAP therapy, consider referral to ENT for evaluation for hypoglossal nerve stimulator.  3. Weight loss may be of benefit in reducing the severity of respiratory events and snoring .  4. Routine follow-up efficacy testing should be performed.  Report prepared by: Signature: Fransico Him Electronically Signed: Dec 08, 2018

## 2018-12-10 ENCOUNTER — Ambulatory Visit: Payer: Medicare HMO

## 2018-12-10 ENCOUNTER — Telehealth: Payer: Self-pay | Admitting: *Deleted

## 2018-12-10 NOTE — Telephone Encounter (Signed)
-----   Message from Sueanne Margarita, MD sent at 12/08/2018 10:43 PM EDT ----- Please let patient know that they have sleep apnea and recommend CPAP titration. Please set up titration in the sleep lab ASAP due to severe oSA with severe hypoxemia.

## 2018-12-10 NOTE — Telephone Encounter (Signed)
Informed patient of sleep study results and patient understanding was verbalized. Patient understands her sleep study showed they have sleep apnea and recommend CPAP titration. Please set up titration in the sleep lab ASAP due to severe oSA with severe hypoxemia.  Pt is aware and agreeable to her results.  Sent to precert

## 2018-12-13 ENCOUNTER — Other Ambulatory Visit: Payer: Self-pay

## 2018-12-13 ENCOUNTER — Encounter (HOSPITAL_COMMUNITY): Payer: Medicare HMO | Admitting: Cardiology

## 2018-12-14 ENCOUNTER — Telehealth: Payer: Self-pay | Admitting: *Deleted

## 2018-12-14 NOTE — Telephone Encounter (Signed)
-----   Message from Freada Bergeron, [place] sent at 12/10/2018  7:34 PM EDT ----- Regarding: PRECERT recommend CPAP titration.

## 2018-12-14 NOTE — Telephone Encounter (Signed)
Staff message sent to [facility] received. Ok to schedule CPAP titration. Authorization  # [id number]. Valid dates 12/27/18 to 01/26/19.

## 2018-12-15 ENCOUNTER — Telehealth: Payer: Self-pay | Admitting: *Deleted

## 2018-12-15 DIAGNOSIS — G4733 Obstructive sleep apnea (adult) (pediatric): Secondary | ICD-10-CM

## 2018-12-15 NOTE — Telephone Encounter (Signed)
-----   Message from Lauralee Evener, [place] sent at 12/14/2018 11:49 AM EDT ----- Regarding: RE: Pecola Leisure auth received. Ok to schedule. Auth # [id number] valid dates 8/10 to 9/9 ----- Message ----- From: Freada Bergeron, CMA Sent: 12/10/2018   7:34 PM EDT To: [facility] Sleep Studies Subject: PRECERT                                        recommend CPAP titration.

## 2018-12-17 NOTE — Telephone Encounter (Signed)
Called patient LMTCB on (h) phone.

## 2018-12-21 NOTE — Telephone Encounter (Addendum)
Patient is scheduled for lab study on 12/30/18. SHE is scheduled for COVID screening on 12/27/18 prior to titration.   Patient understands her sleep study will be done at [facility] sleep lab. Patient understands she will receive a sleep packet in a week or so. Patient understands to call if she does not receive the sleep packet in a timely manner. Patient agrees with treatment and thanked me for call Left detailed message on voicemail with date and time of titration and informed patient to call back to confirm or reschedule.

## 2018-12-25 ENCOUNTER — Other Ambulatory Visit (HOSPITAL_COMMUNITY): Payer: Medicare HMO

## 2018-12-27 ENCOUNTER — Other Ambulatory Visit (HOSPITAL_COMMUNITY)
Admission: RE | Admit: 2018-12-27 | Discharge: 2018-12-27 | Disposition: A | Discharge status: Home / Self Care | Payer: Medicare HMO | Source: Ambulatory Visit | Attending: Cardiology | Admitting: Cardiology

## 2018-12-27 DIAGNOSIS — Z01812 Encounter for preprocedural laboratory examination: Secondary | ICD-10-CM | POA: Insufficient documentation

## 2018-12-27 DIAGNOSIS — Z20828 Contact with and (suspected) exposure to other viral communicable diseases: Secondary | ICD-10-CM | POA: Diagnosis not present

## 2018-12-27 LAB — SARS CORONAVIRUS 2 (TAT 6-24 HRS): SARS Coronavirus 2: NEGATIVE

## 2018-12-28 ENCOUNTER — Encounter (HOSPITAL_BASED_OUTPATIENT_CLINIC_OR_DEPARTMENT_OTHER): Payer: Medicare HMO | Admitting: Neurology

## 2018-12-30 ENCOUNTER — Other Ambulatory Visit: Payer: Self-pay

## 2018-12-30 ENCOUNTER — Ambulatory Visit (HOSPITAL_BASED_OUTPATIENT_CLINIC_OR_DEPARTMENT_OTHER): Payer: Medicare HMO | Attending: Cardiology | Admitting: Cardiology

## 2018-12-30 DIAGNOSIS — I4891 Unspecified atrial fibrillation: Secondary | ICD-10-CM | POA: Insufficient documentation

## 2018-12-30 DIAGNOSIS — R0902 Hypoxemia: Secondary | ICD-10-CM | POA: Insufficient documentation

## 2018-12-30 DIAGNOSIS — G4733 Obstructive sleep apnea (adult) (pediatric): Secondary | ICD-10-CM | POA: Insufficient documentation

## 2019-01-10 ENCOUNTER — Telehealth: Payer: Self-pay | Admitting: *Deleted

## 2019-01-10 DIAGNOSIS — G4733 Obstructive sleep apnea (adult) (pediatric): Secondary | ICD-10-CM

## 2019-01-10 NOTE — Telephone Encounter (Signed)
-----   Message from Sueanne Margarita, MD sent at 01/10/2019  2:57 PM EDT ----- Please let patient know that they had a successful PAP titration and let DME know that orders are in EPIC.  Please set up 10 week OV with me.

## 2019-01-10 NOTE — Telephone Encounter (Signed)
Informed patient of sleep study results and patient understanding was verbalized. Patient understands her sleep study showed they had a successful PAP titration and orders are in EPIC. Please set up 10 week OV with me.  Upon patient request DME selection is [facility].  Patient understands she will be contacted by [facility] to set up her cpap. Patient understands to call if [facility] does not contact her with new setup in a timely manner. Patient understands they will be called once confirmation has been received from [facility] that they have received their new machine to schedule 10 week follow up appointment.  [facility] notified of new cpap order  Please add to airview Patient was grateful for the call and thanked me.

## 2019-01-10 NOTE — Procedures (Signed)
    Patient Name: Kimberly [name], Kimberly [name] Date: 12/30/2018 Gender: Female D.O.B: [birth year] Age (years): 69 Referring Provider: Fransico Him MD, ABSM Height (inches): 66 Interpreting Physician: Fransico Him MD, ABSM Weight (lbs): 362 RPSGT: Baxter Flattery BMI: 58 MRN: [id number] Neck Size: 17.00  CLINICAL INFORMATION The patient is referred for a CPAP titration to treat sleep apnea.  SLEEP STUDY TECHNIQUE As per the AASM Manual for the Scoring of Sleep and Associated Events v2.3 (April 2016) with a hypopnea requiring 4% desaturations.  The channels recorded and monitored were frontal, central and occipital EEG, electrooculogram (EOG), submentalis EMG (chin), nasal and oral airflow, thoracic and abdominal wall motion, anterior tibialis EMG, snore microphone, electrocardiogram, and pulse oximetry. Continuous positive airway pressure (CPAP) was initiated at the beginning of the study and titrated to treat sleep-disordered breathing.  MEDICATIONS Medications self-administered by patient taken the night of the study : N/A  TECHNICIAN COMMENTS Comments added by technician: Patient had difficulty initiating sleep. Patient was restless all through the night. Comments added by scorer: N/A  RESPIRATORY PARAMETERS Optimal PAP Pressure (cm): 14  AHI at Optimal Pressure (/hr):3.9 Overall Minimal O2 (%):85.0  Supine % at Optimal Pressure (%):68 Minimal O2 at Optimal Pressure (%): 85.0   SLEEP ARCHITECTURE The study was initiated at 11:19:56 PM and ended at 5:25:22 AM.  Sleep onset time was 48.6 minutes and the sleep efficiency was 39.5%. The total sleep time was 144.5 minutes.  The patient spent 10.4% of the night in stage N1 sleep, 72.3% in stage N2 sleep, 0.0% in stage N3 and 17.3% in REM.Stage REM latency was 213.5 minutes  Wake after sleep onset was 172.3. Alpha intrusion was absent. Supine sleep was 59.17%.  CARDIAC DATA The 2 lead EKG demonstrated sinus rhythm. The mean heart rate  was 68.0 beats per minute. Other EKG findings include: Atrial Fibrillation.  LEG MOVEMENT DATA The total Periodic Limb Movements of Sleep (PLMS) were 0. The PLMS index was 0.0. A PLMS index of <15 is considered normal in adults.  IMPRESSIONS - The optimal PAP pressure was 14 cm of water. - Central sleep apnea was not noted during this titration (CAI = 0.0/h). - Moderate oxygen desaturations were observed during this titration (min O2 = 85.0%). - No snoring was audible during this study. - 2-lead EKG demonstrated: Atrial Fibrillation - Clinically significant periodic limb movements were not noted during this study. Arousals associated with PLMs were rare.  DIAGNOSIS - Obstructive Sleep Apnea (327.23 [G47.33 ICD-10]) - Nocturnal Hypoxemia - Atrial Fibrillation  RECOMMENDATIONS - Trial of CPAP therapy on 14 cm H2O with a Medium size Resmed Full Face Mask AirFit F20 mask and heated humidification. - Avoid alcohol, sedatives and other CNS depressants that may worsen sleep apnea and disrupt normal sleep architecture. - Sleep hygiene should be reviewed to assess factors that may improve sleep quality. - Weight management and regular exercise should be initiated or continued. - Return to Sleep Center for re-evaluation after 10 weeks of therapy  [Electronically signed] 01/10/2019 02:54 PM  Fransico Him MD, ABSM Diplomate, American Board of Sleep Medicine

## 2019-01-18 NOTE — Telephone Encounter (Signed)
DME has changed to [facility] she had no out of network benefits with choice.

## 2019-01-27 ENCOUNTER — Ambulatory Visit (INDEPENDENT_AMBULATORY_CARE_PROVIDER_SITE_OTHER): Payer: Medicare HMO | Admitting: Neurology

## 2019-01-27 ENCOUNTER — Other Ambulatory Visit: Payer: Self-pay

## 2019-01-27 ENCOUNTER — Encounter: Payer: Self-pay | Admitting: Neurology

## 2019-01-27 VITALS — BP 110/86 | HR 63 | Temp 98.0°F | Ht 66.0 in | Wt 375.0 lb

## 2019-01-27 DIAGNOSIS — H814 Vertigo of central origin: Secondary | ICD-10-CM | POA: Diagnosis not present

## 2019-01-27 DIAGNOSIS — R42 Dizziness and giddiness: Secondary | ICD-10-CM

## 2019-01-27 MED ORDER — ALPRAZOLAM 0.5 MG PO TABS: ORAL_TABLET | ORAL | 0 refills | Status: DC

## 2019-01-27 NOTE — Progress Notes (Signed)
Reason for visit: Episodic dizziness  Referring physician: Dr. Jamse [name] is a 63 y.o. female  History of present illness:  Kimberly [name] is a 63 year old right-handed black female with a history of morbid obesity and a history of atrial fibrillation.  The patient has transthyretin amyloidosis of the heart.  The patient is somewhat of a poor historian but she indicates that about a year ago she began having transient brief episodes of dizziness that occur only while standing.  The patient can sometimes induce the lightheaded sensations with stooping and then standing up but the episodes may occur randomly.  She may have a brief event of feeling lightheaded without vertigo, she will have to lean on something and let the episode pass which may take 2 minutes or so.  The patient does not fall or blackout, she does not have tunnel vision.  She again reports no true vertigo or nausea.  She reports no numbness or weakness of the face, arms, legs.  She may have 1 or 2 such episodes a month.  She does have atrial fibrillation but she cannot feel when she goes into this heart rhythm.  The patient was seen through ENT for bilateral tinnitus.  She has never had a head scan for the dizziness.  She does have sleep apnea, she is on CPAP.  She may have some slight shortness of breath with the episodes of dizziness at times.  She is sent to this office for further evaluation.  Past Medical History:  Diagnosis Date  . Aortic stenosis   . Arthritis    KNEES  . Atrial fibrillation ([place])   . Complication of anesthesia   . Diabetes ([place])   . HLD (hyperlipidemia)   . Left atrial thrombus   . Morbid obesity ([place])   . NICM (nonischemic cardiomyopathy) ([place])    EF 30%  . PONV (postoperative nausea and vomiting)     Past Surgical History:  Procedure Laterality Date  . CARDIOVERSION N/A 07/29/2013   Procedure: CARDIOVERSION;  Surgeon: [name], MD;  Location: [facility] ENDOSCOPY;  Service:  Cardiovascular;  Laterality: N/A;  . CARDIOVERSION N/A 11/11/2013   Procedure: CARDIOVERSION;  Surgeon: [name], MD;  Location: [facility] ENDOSCOPY;  Service: Cardiovascular;  Laterality: N/A;  . CARDIOVERSION N/A 12/08/2013   Procedure: CARDIOVERSION;  Surgeon: [name], MD;  Location: [facility] ENDOSCOPY;  Service: Cardiovascular;  Laterality: N/A;  . CARDIOVERSION N/A 06/30/2018   Procedure: CARDIOVERSION;  Surgeon: [name], MD;  Location: [facility] ENDOSCOPY;  Service: Cardiovascular;  Laterality: N/A;  . JOINT REPLACEMENT    . LEFT HEART CATHETERIZATION WITH CORONARY ANGIOGRAM N/A 08/01/2013   Procedure: LEFT HEART CATHETERIZATION WITH CORONARY ANGIOGRAM;  Surgeon: Blane Ohara, MD;  Location: [facility] CATH LAB;  Service: Cardiovascular;  Laterality: N/A;  . TEE WITHOUT CARDIOVERSION N/A 07/29/2013   Procedure: TRANSESOPHAGEAL ECHOCARDIOGRAM (TEE);  Surgeon: [name], MD;  Location: [facility];  Service: Cardiovascular;  Laterality: N/A;  . TEE WITHOUT CARDIOVERSION N/A 11/11/2013   Procedure: TRANSESOPHAGEAL ECHOCARDIOGRAM (TEE);  Surgeon: [name], MD;  Location: [facility] ENDOSCOPY;  Service: Cardiovascular;  Laterality: N/A;    Family History  Problem Relation Age of Onset  . CVA Mother     Social history:  reports that she has never smoked. She has never used smokeless tobacco. She reports that she does not drink alcohol or use drugs.  Medications:  Prior to Admission medications   Medication Sig Start Date End Date  Taking? Authorizing Provider  acetaminophen (TYLENOL) 650 MG CR tablet Take 1,300 mg by mouth every 8 (eight) hours as needed for pain.    Yes [provider]  amiodarone (PACERONE) 200 MG tablet Take 200 mg by mouth 2 (two) times daily.   Yes [provider]  Cholecalciferol (VITAMIN D-3) 5000 UNITS TABS Take 5,000 Units by mouth daily.   Yes [provider]  ELIQUIS 5 MG TABS tablet TAKE 1 TABLET BY MOUTH TWICE A DAY 09/14/18  Yes  [name], MD  Ferrous Sulfate (PX IRON) 27 MG TABS Take 27 mg by mouth 2 (two) times daily.    Yes [provider]  fexofenadine (ALLEGRA) 180 MG tablet Take 180 mg by mouth daily.   Yes [provider]  lisinopril (ZESTRIL) 10 MG tablet TAKE 1 TABLET BY MOUTH EVERY DAY 09/17/18  Yes [name], MD  meclizine (ANTIVERT) 25 MG tablet Take 25 mg by mouth as needed for dizziness.   Yes [provider]  metoCLOPramide (REGLAN) 5 MG tablet Take 5 mg by mouth as needed for nausea.   Yes [provider]  metoprolol succinate (TOPROL-XL) 100 MG 24 hr tablet Take 1 tablet (100 mg total) by mouth 2 (two) times daily. 09/20/18  Yes [name], MD  nitroGLYCERIN (NITROSTAT) 0.4 MG SL tablet Place 1 tablet (0.4 mg total) under the tongue every 5 (five) minutes as needed for chest pain. 09/15/17  Yes [name], MD  rosuvastatin (CRESTOR) 40 MG tablet Take 40 mg by mouth at bedtime. 03/22/16  Yes [provider]  spironolactone (ALDACTONE) 25 MG tablet TAKE 1 TABLET BY MOUTH EVERY DAY 12/07/18  Yes [name], MD  Tafamidis Meglumine, Cardiac, 20 MG CAPS Take 40 mg by mouth 2 (two) times daily.   Yes [provider]      Allergies  Allergen Reactions  . Tramadol Nausea And Vomiting    ROS:  Out of a complete 14 system review of symptoms, the patient complains only of the following symptoms, and all other reviewed systems are negative.  Dizziness Weight gain  Blood pressure 110/86, pulse 63, temperature 98 F (36.7 C), temperature source Temporal, height 5\' 6"  (1.676 m), weight (!) 375 lb (170.1 kg), SpO2 98 %.   Blood pressure, right arm, standing is [id number] systolic. Blood pressure, right arm, sitting is [id number] systolic.  Physical Exam  General: The patient is alert and cooperative at the time of the examination.  The patient is morbidly obese.  Eyes: Pupils are equal, round, and reactive to light. Discs are flat  bilaterally.  Neck: The neck is supple, no carotid bruits are noted.  Respiratory: The respiratory examination is clear.  Cardiovascular: The cardiovascular examination reveals a regular rate and rhythm, no obvious murmurs or rubs are noted.  Skin: Extremities are with 1-2+ edema below the knees bilaterally.  Neurologic Exam  Mental status: The patient is alert and oriented x 3 at the time of the examination. The patient has apparent normal recent and remote memory, with an apparently normal attention span and concentration ability.  Cranial nerves: Facial symmetry is present. There is good sensation of the face to pinprick and soft touch bilaterally. The strength of the facial muscles and the muscles to head turning and shoulder shrug are normal bilaterally. Speech is well enunciated, no aphasia or dysarthria is noted. Extraocular movements are full. Visual fields are full. The tongue is midline, and the patient has symmetric elevation  of the soft palate. No obvious hearing deficits are noted.  Motor: The motor testing reveals 5 over 5 strength of all 4 extremities. Good symmetric motor tone is noted throughout.  Sensory: Sensory testing is intact to pinprick, soft touch, vibration sensation, and position sense on all 4 extremities. No evidence of extinction is noted.  Coordination: Cerebellar testing reveals good finger-nose-finger and heel-to-shin bilaterally.  Gait and station: Gait is slightly wide-based. Tandem gait is unsteady. Romberg is negative. No drift is seen.  Reflexes: Deep tendon reflexes are symmetric, but are depressed bilaterally. Toes are downgoing bilaterally.   Assessment/Plan:  1.  Episodic dizziness  2.  History of atrial fibrillation  3.  Morbid obesity  The patient is having episodes of brief lightheaded sensations lasting 2 minutes, occurring only with standing.  The patient does have atrial fibrillation, but she is unable to sense when she flips in or  out of atrial fibrillation.  It is possible, the episodes of dizziness may represent episodes of atrial fibrillation while standing.  The patient will be sent for MRI of the brain and a carotid Doppler study.  If the episodes become frequent, a cardiac monitor study may be of some help in determining what the events represent.  Jill Alexanders MD 01/27/2019 3:43 PM  [facility] [address] [place], [place] [address]  Phone [phone] Fax [phone]

## 2019-02-01 ENCOUNTER — Telehealth: Payer: Self-pay | Admitting: Neurology

## 2019-02-01 NOTE — Telephone Encounter (Signed)
[employer] pending faxed notes  

## 2019-02-02 NOTE — Telephone Encounter (Signed)
I called [employer] to check the status they did receive my fax and it is still pending.

## 2019-02-10 NOTE — Telephone Encounter (Signed)
Mcarthur Rossetti Josem Kaufmann: [id number] (exp. 02/01/19 to 03/03/19) order sent to GI. They will reach out to the patient to schedule.

## 2019-02-28 ENCOUNTER — Ambulatory Visit (HOSPITAL_COMMUNITY)
Admission: RE | Admit: 2019-02-28 | Discharge: 2019-02-28 | Disposition: A | Discharge status: Home / Self Care | Payer: Medicare HMO | Source: Ambulatory Visit | Attending: Neurology | Admitting: Neurology

## 2019-02-28 ENCOUNTER — Other Ambulatory Visit: Payer: Self-pay

## 2019-02-28 ENCOUNTER — Telehealth: Payer: Self-pay | Admitting: Neurology

## 2019-02-28 DIAGNOSIS — R42 Dizziness and giddiness: Secondary | ICD-10-CM | POA: Diagnosis present

## 2019-02-28 NOTE — Telephone Encounter (Signed)
I called the patient.  The carotid Doppler study is unremarkable, MRI of the brain is pending.    Carotid doppler 02/28/19:  Summary: Right Carotid: Velocities in the right ICA are consistent with a 1-39% stenosis.  Left Carotid: Velocities in the left ICA are consistent with a 1-39% stenosis.  Vertebrals: Bilateral vertebral arteries demonstrate antegrade flow.

## 2019-02-28 NOTE — Progress Notes (Signed)
Carotid duplex has been completed.   Preliminary results in CV Proc.   Kimberly [name] 02/28/2019 10:27 AM

## 2019-03-04 ENCOUNTER — Ambulatory Visit
Admission: RE | Admit: 2019-03-04 | Discharge: 2019-03-04 | Disposition: A | Discharge status: Home / Self Care | Payer: Medicare HMO | Source: Ambulatory Visit | Attending: Neurology | Admitting: Neurology

## 2019-03-04 ENCOUNTER — Other Ambulatory Visit: Payer: Self-pay

## 2019-03-04 DIAGNOSIS — H814 Vertigo of central origin: Secondary | ICD-10-CM

## 2019-03-04 MED ORDER — GADOBENATE DIMEGLUMINE 529 MG/ML IV SOLN
20.0000 mL | Freq: Once | INTRAVENOUS | Status: AC | PRN
  Administered 2019-03-04: 20 mL via INTRAVENOUS

## 2019-03-06 ENCOUNTER — Telehealth: Payer: Self-pay | Admitting: Neurology

## 2019-03-06 DIAGNOSIS — R42 Dizziness and giddiness: Secondary | ICD-10-CM

## 2019-03-06 NOTE — Telephone Encounter (Signed)
  Call the patient.  The patient has a schwannoma on the left side, so mild to moderate small vessel ischemic changes.  The patient has seen ENT previously, she cannot remember the name of the doctor, she will try to get this and call our office and I will make a referral back to that physician.  It is possible that the schwannoma may be the source of some of the hearing loss on the left as well as some of the dizziness.   MRI brain 03/04/19:  IMPRESSION: This MRI of the brain with and without contrast shows the following: 1.   There is a 7 x 3 mm enhancing left intracanalicular mass consistent with a left intracanalicular vestibular schwannoma. 2.    Multiple T2/FLAIR hyperintense foci, predominantly in the subcortical white matter.  These are nonspecific and could represent chronic microvascular ischemic change or sequela of migraine or cardioemboli. 3.    There are no acute findings.

## 2019-03-07 NOTE — Addendum Note (Signed)
Addended by: Kathrynn Ducking on: 03/07/2019 03:03 PM   Modules accepted: Orders

## 2019-03-07 NOTE — Telephone Encounter (Signed)
Pt called back and stated that the name of the ENT she saw was Dr. Raiford Noble Phone number [phone] Please advise.

## 2019-03-07 NOTE — Telephone Encounter (Signed)
I called the patient, left message, I will make a referral to ENT.

## 2019-03-27 ENCOUNTER — Other Ambulatory Visit (HOSPITAL_COMMUNITY): Payer: Self-pay | Admitting: Cardiology

## 2019-04-19 ENCOUNTER — Other Ambulatory Visit (HOSPITAL_COMMUNITY): Payer: Self-pay | Admitting: Cardiology

## 2019-04-19 DIAGNOSIS — I4891 Unspecified atrial fibrillation: Secondary | ICD-10-CM

## 2019-04-20 ENCOUNTER — Other Ambulatory Visit (HOSPITAL_COMMUNITY): Payer: Self-pay

## 2019-04-20 DIAGNOSIS — I4891 Unspecified atrial fibrillation: Secondary | ICD-10-CM

## 2019-04-20 DIAGNOSIS — D333 Benign neoplasm of cranial nerves: Secondary | ICD-10-CM | POA: Insufficient documentation

## 2019-04-20 MED ORDER — SPIRONOLACTONE 25 MG PO TABS: 25.0000 mg | ORAL_TABLET | Freq: Every day | ORAL | 1 refills | Status: DC

## 2019-04-21 ENCOUNTER — Other Ambulatory Visit (HOSPITAL_COMMUNITY): Payer: Self-pay

## 2019-04-21 ENCOUNTER — Other Ambulatory Visit (HOSPITAL_COMMUNITY): Payer: Self-pay | Admitting: Cardiology

## 2019-04-21 MED ORDER — NITROGLYCERIN 0.4 MG SL SUBL: 0.4000 mg | SUBLINGUAL_TABLET | SUBLINGUAL | 1 refills | Status: DC | PRN

## 2019-07-05 ENCOUNTER — Other Ambulatory Visit: Payer: Self-pay | Admitting: Cardiology

## 2019-07-11 ENCOUNTER — Other Ambulatory Visit (HOSPITAL_COMMUNITY): Payer: Self-pay | Admitting: Cardiology

## 2019-08-24 ENCOUNTER — Other Ambulatory Visit (HOSPITAL_COMMUNITY): Payer: Self-pay | Admitting: Cardiology

## 2019-08-24 DIAGNOSIS — I4891 Unspecified atrial fibrillation: Secondary | ICD-10-CM

## 2019-08-29 ENCOUNTER — Other Ambulatory Visit: Payer: Self-pay

## 2019-08-29 ENCOUNTER — Encounter: Payer: Self-pay | Admitting: Neurology

## 2019-08-29 ENCOUNTER — Ambulatory Visit: Payer: Medicare HMO | Admitting: Neurology

## 2019-08-29 DIAGNOSIS — R42 Dizziness and giddiness: Secondary | ICD-10-CM

## 2019-08-29 DIAGNOSIS — D333 Benign neoplasm of cranial nerves: Secondary | ICD-10-CM | POA: Diagnosis not present

## 2019-08-29 HISTORY — DX: Benign neoplasm of cranial nerves: D33.3

## 2019-08-29 NOTE — Progress Notes (Signed)
Reason for visit: Vertigo, left-sided schwannoma  Kimberly [name] is an 64 y.o. female  History of present illness:  Kimberly [name] is a 64 year old right-handed black female with a history of diabetes and morbid obesity who had been having episodes of brief vertigo.  The patient has events about 3 times a month lasting less than 2 minutes.  The episodes are usually occurring with standing.  The patient is morbidly obese, she has decreased mobility because of this.  MRI of the brain was done and showed evidence of a left sided vestibular schwannoma.  The patient has been seen in [place], she has gotten a gamma knife treatment, she will be following up with them and July 2021.  She is followed locally by Dr. Benjamine Mola.  The patient reports ongoing problems with hearing in the left ear and some tinnitus is quite severe and keeps her from sleeping.  She has not had any falls, she normally walks with a cane.  She denies any new numbness or weakness of the extremities.  Past Medical History:  Diagnosis Date  . Aortic stenosis   . Arthritis    KNEES  . Atrial fibrillation ([place])   . Complication of anesthesia   . Diabetes ([place])   . HLD (hyperlipidemia)   . Left atrial thrombus   . Morbid obesity ([place])   . NICM (nonischemic cardiomyopathy) ([place])    EF 30%  . PONV (postoperative nausea and vomiting)     Past Surgical History:  Procedure Laterality Date  . CARDIOVERSION N/A 07/29/2013   Procedure: CARDIOVERSION;  Surgeon: [name], MD;  Location: [facility] ENDOSCOPY;  Service: Cardiovascular;  Laterality: N/A;  . CARDIOVERSION N/A 11/11/2013   Procedure: CARDIOVERSION;  Surgeon: [name], MD;  Location: [facility] ENDOSCOPY;  Service: Cardiovascular;  Laterality: N/A;  . CARDIOVERSION N/A 12/08/2013   Procedure: CARDIOVERSION;  Surgeon: [name], MD;  Location: [facility] ENDOSCOPY;  Service: Cardiovascular;  Laterality: N/A;  . CARDIOVERSION N/A 06/30/2018   Procedure: CARDIOVERSION;  Surgeon:  [name], MD;  Location: [facility] ENDOSCOPY;  Service: Cardiovascular;  Laterality: N/A;  . JOINT REPLACEMENT    . LEFT HEART CATHETERIZATION WITH CORONARY ANGIOGRAM N/A 08/01/2013   Procedure: LEFT HEART CATHETERIZATION WITH CORONARY ANGIOGRAM;  Surgeon: Blane Ohara, MD;  Location: [facility] CATH LAB;  Service: Cardiovascular;  Laterality: N/A;  . TEE WITHOUT CARDIOVERSION N/A 07/29/2013   Procedure: TRANSESOPHAGEAL ECHOCARDIOGRAM (TEE);  Surgeon: [name], MD;  Location: [place];  Service: Cardiovascular;  Laterality: N/A;  . TEE WITHOUT CARDIOVERSION N/A 11/11/2013   Procedure: TRANSESOPHAGEAL ECHOCARDIOGRAM (TEE);  Surgeon: [name], MD;  Location: [facility] ENDOSCOPY;  Service: Cardiovascular;  Laterality: N/A;    Family History  Problem Relation Age of Onset  . CVA Mother     Social history:  reports that she has never smoked. She has never used smokeless tobacco. She reports that she does not drink alcohol or use drugs.    Allergies  Allergen Reactions  . Tramadol Nausea And Vomiting    Medications:  Prior to Admission medications   Medication Sig Start Date End Date Taking? Authorizing Provider  acetaminophen (TYLENOL) 650 MG CR tablet Take 1,300 mg by mouth every 8 (eight) hours as needed for pain.    Yes [provider]  ALPRAZolam Duanne Moron) 0.5 MG tablet Take 2 tablets approximately 45 minutes prior to the MRI study, take a third tablet if needed. 01/27/19  Yes Kathrynn Ducking, MD  amiodarone (PACERONE) 200  MG tablet Take 200 mg by mouth 2 (two) times daily.   Yes [provider]  apixaban (ELIQUIS) 5 MG TABS tablet Take 1 tablet (5 mg total) by mouth 2 (two) times daily. PLEASE CALL FOR OFFICE VISIT [phone] 07/05/19  Yes [name], MD  Cholecalciferol (VITAMIN D-3) 5000 UNITS TABS Take 5,000 Units by mouth daily.   Yes [provider]  Ferrous Sulfate (PX IRON) 27 MG TABS Take 27 mg by mouth 2 (two) times daily.    Yes  [provider]  fexofenadine (ALLEGRA) 180 MG tablet Take 180 mg by mouth daily.   Yes [provider]  furosemide (LASIX) 40 MG tablet Take by mouth.   Yes [provider]  lisinopril (ZESTRIL) 10 MG tablet TAKE 1 TABLET BY MOUTH EVERY DAY 04/21/19  Yes [name], MD  meclizine (ANTIVERT) 25 MG tablet Take 25 mg by mouth as needed for dizziness.   Yes [provider]  metoCLOPramide (REGLAN) 5 MG tablet Take 5 mg by mouth as needed for nausea.   Yes [provider]  metoprolol succinate (TOPROL-XL) 100 MG 24 hr tablet Take 1 tablet (100 mg total) by mouth 2 (two) times daily. Needs appt for further refills 08/26/19  Yes [name], MD  nitroGLYCERIN (NITROSTAT) 0.4 MG SL tablet Place 1 tablet (0.4 mg total) under the tongue every 5 (five) minutes as needed for chest pain. 04/21/19  Yes [name], MD  Omega-3 1000 MG CAPS Take by mouth.   Yes [provider]  RESTASIS 0.05 % ophthalmic emulsion  06/13/19  Yes [provider]  rosuvastatin (CRESTOR) 40 MG tablet Take 40 mg by mouth at bedtime. 03/22/16  Yes [provider]  spironolactone (ALDACTONE) 25 MG tablet Take 1 tablet (25 mg total) by mouth daily. Needs appt for further refills 08/26/19  Yes [name], MD  Tafamidis Meglumine, Cardiac, 20 MG CAPS Take 40 mg by mouth 2 (two) times daily.   Yes [provider]    ROS:  Out of a complete 14 system review of symptoms, the patient complains only of the following symptoms, and all other reviewed systems are negative.  Hearing loss Tinnitus Vertigo  Blood pressure (!) 137/53, pulse (!) 101, temperature (!) 96.9 F (36.1 C), height 5\' 6"  (1.676 m), weight (!) 387 lb (175.5 kg).  Physical Exam  General: The patient is alert and cooperative at the time of the examination.  The patient is morbidly obese.  Skin: No significant peripheral edema is noted.   Neurologic Exam  Mental status:  The patient is alert and oriented x 3 at the time of the examination. The patient has apparent normal recent and remote memory, with an apparently normal attention span and concentration ability.   Cranial nerves: Facial symmetry is present. Speech is normal, no aphasia or dysarthria is noted. Extraocular movements are full. Visual fields are full.  Motor: The patient has good strength in all 4 extremities.  Sensory examination: Soft touch sensation is symmetric on the face, arms, and legs.  Coordination: The patient has good finger-nose-finger and heel-to-shin bilaterally.  Gait and station: The patient has a wide-based gait.  The patient normally walks with a cane.  Romberg is negative but is slightly unsteady.  Tandem gait was not attempted.  Reflexes: Deep tendon reflexes are symmetric, but are depressed.   MRI brain 03/04/19:  IMPRESSION: This MRI of the brain with and without contrast shows the following: 1.  There is a 7 x 3 mm enhancing left intracanalicular mass consistent with a left intracanalicular vestibular schwannoma. 2. Multiple T2/FLAIR hyperintense foci, predominantly in the subcortical white matter. These are nonspecific and could represent chronic microvascular ischemic change or sequela of migraine or cardioemboli. 3. There are no acute findings.  * MRI scan images were reviewed online. I agree with the written report.    Carotid doppler 02/28/19:  Summary: Right Carotid: Velocities in the right ICA are consistent with a 1-39% stenosis.  Left Carotid: Velocities in the left ICA are consistent with a 1-39% stenosis.  Vertebrals: Bilateral vertebral arteries demonstrate antegrade flow.  Assessment/Plan:  1.  Left vestibular schwannoma  2.  Intermittent vertigo  The patient is having occasional episodes of vertigo that are quite brief and likely do not impact her ability to function.  The patient will be followed for her schwannoma, this is the  likely source of her symptoms.  She will follow-up here if needed.   Jill Alexanders MD 08/29/2019 12:05 PM  [facility] [address] [place], [place] [address]  Phone [phone] Fax [phone]

## 2019-12-06 DIAGNOSIS — R195 Other fecal abnormalities: Secondary | ICD-10-CM | POA: Insufficient documentation

## 2019-12-06 DIAGNOSIS — D649 Anemia, unspecified: Secondary | ICD-10-CM | POA: Insufficient documentation

## 2019-12-16 ENCOUNTER — Telehealth (HOSPITAL_COMMUNITY): Payer: Self-pay | Admitting: Cardiology

## 2019-12-16 NOTE — Telephone Encounter (Signed)
Medical clearance completed by Dr Aundra Dubin Patient is approved to proceed with colonoscopy  from a cardiac standpoint.low risk Medication prep- none  Faxed to [facility] GI Attn Dr Marzetta Merino Fax@ [phone]

## 2020-01-15 ENCOUNTER — Other Ambulatory Visit (HOSPITAL_COMMUNITY): Payer: Self-pay | Admitting: Cardiology

## 2020-02-05 ENCOUNTER — Other Ambulatory Visit (HOSPITAL_COMMUNITY): Payer: Self-pay | Admitting: Cardiology

## 2020-02-05 DIAGNOSIS — I4891 Unspecified atrial fibrillation: Secondary | ICD-10-CM

## 2020-02-24 ENCOUNTER — Encounter (HOSPITAL_COMMUNITY): Payer: Medicare HMO | Admitting: Cardiology

## 2020-03-01 ENCOUNTER — Other Ambulatory Visit: Payer: Self-pay

## 2020-03-01 ENCOUNTER — Ambulatory Visit (HOSPITAL_COMMUNITY)
Admission: RE | Admit: 2020-03-01 | Discharge: 2020-03-01 | Disposition: A | Discharge status: Home / Self Care | Payer: Medicare HMO | Source: Ambulatory Visit | Attending: Cardiology | Admitting: Cardiology

## 2020-03-01 VITALS — BP 130/68 | HR 90 | Wt 389.4 lb

## 2020-03-01 DIAGNOSIS — K921 Melena: Secondary | ICD-10-CM | POA: Insufficient documentation

## 2020-03-01 DIAGNOSIS — I2510 Atherosclerotic heart disease of native coronary artery without angina pectoris: Secondary | ICD-10-CM | POA: Diagnosis not present

## 2020-03-01 DIAGNOSIS — E669 Obesity, unspecified: Secondary | ICD-10-CM | POA: Insufficient documentation

## 2020-03-01 DIAGNOSIS — I43 Cardiomyopathy in diseases classified elsewhere: Secondary | ICD-10-CM

## 2020-03-01 DIAGNOSIS — Z6841 Body Mass Index (BMI) 40.0 and over, adult: Secondary | ICD-10-CM | POA: Insufficient documentation

## 2020-03-01 DIAGNOSIS — I428 Other cardiomyopathies: Secondary | ICD-10-CM | POA: Insufficient documentation

## 2020-03-01 DIAGNOSIS — I4891 Unspecified atrial fibrillation: Secondary | ICD-10-CM

## 2020-03-01 DIAGNOSIS — I480 Paroxysmal atrial fibrillation: Secondary | ICD-10-CM | POA: Diagnosis not present

## 2020-03-01 DIAGNOSIS — E854 Organ-limited amyloidosis: Secondary | ICD-10-CM | POA: Diagnosis not present

## 2020-03-01 DIAGNOSIS — Z79899 Other long term (current) drug therapy: Secondary | ICD-10-CM | POA: Diagnosis not present

## 2020-03-01 DIAGNOSIS — G5603 Carpal tunnel syndrome, bilateral upper limbs: Secondary | ICD-10-CM | POA: Insufficient documentation

## 2020-03-01 DIAGNOSIS — G4733 Obstructive sleep apnea (adult) (pediatric): Secondary | ICD-10-CM | POA: Diagnosis not present

## 2020-03-01 DIAGNOSIS — I5042 Chronic combined systolic (congestive) and diastolic (congestive) heart failure: Secondary | ICD-10-CM | POA: Insufficient documentation

## 2020-03-01 DIAGNOSIS — I5022 Chronic systolic (congestive) heart failure: Secondary | ICD-10-CM

## 2020-03-01 DIAGNOSIS — Z86018 Personal history of other benign neoplasm: Secondary | ICD-10-CM | POA: Insufficient documentation

## 2020-03-01 DIAGNOSIS — Z7901 Long term (current) use of anticoagulants: Secondary | ICD-10-CM | POA: Diagnosis not present

## 2020-03-01 LAB — CBC
HCT: 32.9 % — ABNORMAL LOW (ref 36.0–46.0)
Hemoglobin: 10 g/dL — ABNORMAL LOW (ref 12.0–15.0)
MCH: 28.5 pg (ref 26.0–34.0)
MCHC: 30.4 g/dL (ref 30.0–36.0)
MCV: 93.7 fL (ref 80.0–100.0)
Platelets: 225 10*3/uL (ref 150–400)
RBC: 3.51 MIL/uL — ABNORMAL LOW (ref 3.87–5.11)
RDW: 14.5 % (ref 11.5–15.5)
WBC: 5.6 10*3/uL (ref 4.0–10.5)
nRBC: 0 % (ref 0.0–0.2)

## 2020-03-01 LAB — COMPREHENSIVE METABOLIC PANEL
ALT: 14 U/L (ref 0–44)
AST: 17 U/L (ref 15–41)
Albumin: 4 g/dL (ref 3.5–5.0)
Alkaline Phosphatase: 75 U/L (ref 38–126)
Anion gap: 11 (ref 5–15)
BUN: 31 mg/dL — ABNORMAL HIGH (ref 8–23)
CO2: 27 mmol/L (ref 22–32)
Calcium: 9.3 mg/dL (ref 8.9–10.3)
Chloride: 103 mmol/L (ref 98–111)
Creatinine, Ser: 2.1 mg/dL — ABNORMAL HIGH (ref 0.44–1.00)
GFR, Estimated: 24 mL/min — ABNORMAL LOW (ref >60)
Glucose, Bld: 102 mg/dL — ABNORMAL HIGH (ref 70–99)
Potassium: 4.5 mmol/L (ref 3.5–5.1)
Sodium: 141 mmol/L (ref 135–145)
Total Bilirubin: 0.5 mg/dL (ref 0.3–1.2)
Total Protein: 7.5 g/dL (ref 6.5–8.1)

## 2020-03-01 LAB — TSH: TSH: 1.404 u[IU]/mL (ref 0.350–4.500)

## 2020-03-01 MED ORDER — FUROSEMIDE 40 MG PO TABS: ORAL_TABLET | ORAL | 5 refills | Status: DC

## 2020-03-01 MED ORDER — POTASSIUM CHLORIDE CRYS ER 20 MEQ PO TBCR: 20.0000 meq | EXTENDED_RELEASE_TABLET | Freq: Every day | ORAL | 3 refills | Status: DC

## 2020-03-01 NOTE — Progress Notes (Signed)
Date:  03/01/2020   ID:  Kimberly [name], DOB 1956-04-05, MRN [id number]   Provider location: [facility] Type of Visit: Established patient   PCP:  Windell Hummingbird, PA-C  Cardiologist:  Dr. Aundra Dubin   History of Present Illness: Kimberly [name] is a 64 y.o. female who has a history of paroxysmal atrial fibrillation, obesity, and nonischemic cardiomyopathy.  She was admitted in 3/15 at [facility] with atrial fibrillation and CHF.  TEE was done in preparation for DCCV, showing EF 25-30% but there was LA thrombus so DCCV was not done.  Plan was for anticoagulation x 3 months followed by TEE-guided DCCV in the OR (due to patient's body habitus).  LHC was also done, showing mild nonobstructive CAD.  Patient was diuresed with IV Lasix in the hospital and started on apixaban.   Patient had TEE again in 6/15, showing EF 35-40% with diffuse hypokinesis and no LAA thrombus.  She was cardioverted to NSR (with difficulty).  I started her at that time on amiodarone.  She then went back into atrial fibrillation.  After she had been loaded on amiodarone, cardioverted her again in 7/15 to NSR.    She had gone back into atrial fibrillation in 1/18 and amiodarone was increased to bid in preparation for DCCV. However, she spontaneously converted to NSR.  PYP scan in 4/19 was grade 3, strongly suggestive of TTR amyloidosis.  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  Echo 8/19 showed EF 60%, moderate LVH, mild aortic stenosis.  Of note, she has carpal tunnel syndrome.  She was started on tafamidis.   She went back into atrial fibrillation and had DCCV in 2/20 but was back in atrial fibrillation a few weeks later when she went to atrial fibrillation clinic.  Dr. Rayann Heman thought that morbid obesity precluded atrial fibrillation ablation and concerns about compliance led them not to start her on Tikosyn.  Instead, it was recommended that she start on CPAP for her  OSA, lose weight, and re-attempt DCCV while on amiodarone.   Unfortunately, she has been lost to followup for about 1.5 years.  She has continued to take amiodarone 200 mg bid.  She has remained in atrial fibrillation.  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  In 7/21, she developed hematochezia and had EGD/colonoscopy which were unrevealing except for internal hemorrhoids.   She returns today for followup of diastolic CHF, cardiac amyloidosis, and atrial fibrillation.  As mentioned above, she is now appears to be in permanent atrial fibrillation. She has gained about 25 lbs since I last saw her.  She is more short of breath, does ok in the house but gets dyspneic walking longer distances.  She uses a scooter in stores and came in today in a wheelchair.  She is also limited by knee pain, which is worse with weight gain.  +Orthopnea, no PND.  She does not feel palpitations.  She still has vertigo and tinnitus thought to be related to her schwannoma even s/p surgery.  She says that she cannot use her CPAP because of the tinnitus.  She has tingling and burning in her toes and fingers.   ECG (personally reviewed): atrial fibrillation, rate 90  Labs (4/19): LFTs normal, K 4, creatinine 1.24, TSH normal, immunofixation normal Labs (8/19): TSH normal, K 3.8, creatinine 1.27, LFTs normal, TSH normal Labs (11/19): K 3.8, creatinine  1.21 Labs (2/20): LDL 114, hgb 10.5 Labs (3/20): K 4.4, creatinine 1.42 Labs (4/20): K 4.2, creatinine 1.23 Labs (7/21): K 3.8, creatinine 1.23 Labs (8/21): hgb 11.2 Labs (10/21): creatinine 2.1  PMH: 1. Obese 2. Atrial fibrillation: Paroxysmal => chronic.  Unable to cardiovert in 3/15 due to LAA thrombus noted on TEE. TEE in 6/15 without LAA thrombus.  Patient had cardioversion to NSR with difficulty but back in atrial fibrillation by 7/15 appt.  She was started on amiodarone and  cardioverted in 7/15.   - DCCV in 2/20 but back in atrial fibrillation by 3/20, has been in atrial fibrillation since that time.  3. H/o TKR 4. Nonischemic cardiomyopathy: TEE (3/15) with EF 25-30%, moderate MR, PA systolic pressure 51 mmHg, moderate to severe TR, LA thrombus was noted.   LHC (3/15) with 50% stenosis small PLV.  TEE (6/15) with EF 35-40%, diffuse hypokinesis, mild MR, moderate TR, +PFO, no LA appendage thrombus.  - Echo (10/15) with EF 60-65%.   - ECHO 06/10/2016 EF 60-65% Mild AS - Echo (8/19): EF 60%, moderate LVH, mild aortic stenosis.  - PYP scan in 4/19 was grade 3, strongly suggestive of transthyretin amyloidosis.  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  5. CAD: Nonobstructive.  LHC (3/15) with 50% stenosis small PLV. 6. PFO 7. Left Breast mass: Was taken for lumpectomy in 1/18 but mass had resolved.  8. Aortic stenosis: Mild.  9. Carpal tunnel syndrome.  10. Vestibular schwannoma s/p gamma knife surgery at [facility].  11. OSA: Not currently using CPAP.  12. Hematochezia: C-scope/EGD in 7/21 showed only internal hemorrhoids.  13. 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    Current Outpatient Medications  Medication Sig Dispense Refill  . acetaminophen (TYLENOL) 650 MG CR tablet Take 1,300 mg by mouth every 8 (eight) hours as needed for pain.     [name] ALPRAZolam (XANAX) 0.5 MG tablet Take 2 tablets approximately 45 minutes prior to the MRI study, take a third tablet if needed. 3 tablet 0  . apixaban (ELIQUIS) 5 MG TABS tablet Take 1 tablet (5 mg total) by mouth 2 (two) times daily. PLEASE CALL FOR OFFICE VISIT [phone] 60 tablet 5  . Cholecalciferol (VITAMIN D-3) 5000 UNITS TABS Take 5,000 Units by mouth daily.    . Ferrous Sulfate (PX IRON) 27 MG TABS Take 27 mg by mouth 2 (two) times daily.     . fexofenadine (ALLEGRA) 180 MG tablet Take 180 mg by mouth daily.    . furosemide (LASIX) 40 MG tablet Take 1.5 tablets (60 mg total) by mouth every morning AND 1  tablet (40 mg total) every evening. 75 tablet 5  . lisinopril (ZESTRIL) 10 MG tablet TAKE 1 TABLET BY MOUTH EVERY DAY 90 tablet 2  . meclizine (ANTIVERT) 25 MG tablet Take 25 mg by mouth as needed for dizziness.    . metoCLOPramide (REGLAN) 5 MG tablet Take 5 mg by mouth as needed for nausea.    . metoprolol succinate (TOPROL-XL) 100 MG 24 hr tablet TAKE 1 TABLET (100 MG TOTAL) BY MOUTH 2 (TWO) TIMES DAILY. NEEDS APPT FOR FURTHER REFILLS 180 tablet 0  . nitroGLYCERIN (NITROSTAT) 0.4 MG SL tablet Place 1 tablet (0.4 mg total) under the tongue every 5 (five) minutes as needed for chest pain. 30 tablet 1  . Omega-3 1000 MG CAPS Take by mouth.    . RESTASIS 0.05 % ophthalmic emulsion     . rosuvastatin (CRESTOR) 40 MG tablet Take 40 mg by mouth  at bedtime.  6  . spironolactone (ALDACTONE) 25 MG tablet Take 1 tablet (25 mg total) by mouth daily. Must keep pending appt for further refills 30 tablet 0  . Tafamidis Meglumine, Cardiac, 20 MG CAPS Take 40 mg by mouth 2 (two) times daily.    . potassium chloride SA (KLOR-CON) 20 MEQ tablet Take 1 tablet (20 mEq total) by mouth daily. 90 tablet 3   No current facility-administered medications for this encounter.    Allergies:   Tramadol   Social History:  The patient  reports that she has never smoked. She has never used smokeless tobacco. She reports that she does not drink alcohol and does not use drugs.   Family History:  The patient's family history includes CVA in her mother.   ROS:  Please see the history of present illness.   All other systems are personally reviewed and negative.   Exam:   BP 130/68   Pulse 90   Wt (!) 176.6 kg (389 lb 6.4 oz)   SpO2 95%   BMI 62.85 kg/m  General: NAD Neck: Thick, JVP 9-10 cm, no thyromegaly or thyroid nodule.  Lungs: Clear to auscultation bilaterally with normal respiratory effort. CV: Nondisplaced PMI.  Heart irregular S1/S2, no S3/S4, 2/6 SEM RUSB. 1+ ankle edema.  No carotid bruit.  Normal pedal  pulses.  Abdomen: Soft, nontender, no hepatosplenomegaly, no distention.  Skin: Intact without lesions or rashes.  Neurologic: Alert and oriented x 3.  Psych: Normal affect. Extremities: No clubbing or cyanosis.  HEENT: Normal.   Recent Labs: 03/01/2020: ALT 14; BUN 31; Creatinine, Ser 2.10; Hemoglobin 10.0; Platelets 225; Potassium 4.5; Sodium 141; TSH 1.404  Personally reviewed   Wt Readings from Last 3 Encounters:  03/01/20 (!) 176.6 kg (389 lb 6.4 oz)  08/29/19 (!) 175.5 kg (387 lb)  01/27/19 (!) 170.1 kg (375 lb)     ASSESSMENT AND PLAN:  1. 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  EF 35-40% on TEE in 6/15 but then EF back to 60-65% by 10/15 and remained normal on last echo in 8/19.  LHC without significant coronary disease in 3/15.  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  I have not seen her in > 1 year.  She appears volume overloaded by exam today, NYHA class III symptoms with significant weight gain.  Unfortunately, creatinine is also up today at 2.1. She has been taking Lasix 60 mg daily.  - Increase Lasix to 60 qam/40 qpm with BMET in 10 days.   - With elevated creatinine, I will stop lisinopril and have her start amlodipine 5 mg daily for BP control.   - Continue spiro 25 mg daily - Continue Toprol XL 100 mg BID - She needs repeat echo, I will arrange for this.  2. 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  Atrial fibrillation and carpal tunnel syndrome, both of which she has, go along with amyloidosis.  Her history of orthostatic symptoms as well as symptoms in toes and feet consistent with peripheral neuropathy are also likely related to amyloidosis.  - Continue tafamidis.   - Given hereditary TTR amyloidosis, I  will see if I can get her started on patisiran as well to help with her peripheral neuropathy.  - I will refer to Dr. Broadus John for genetic counseling.   2.  Atrial fibrillation: Last DCCV in 2/20 but back in atrial fibrillation since then.  Dr. Rayann Heman saw, decided against atrial fibrillation ablation due to morbid obesity.  EP decided against Tikosyn due to compliance concerns. Recommendation was to continue amiodarone, start CPAP, and retry DCCV while compliant with CPAP.  However, she is not using CPAP due to her tinnitus.  She has stayed on bid amiodarone and has not followed up for 1.5 years.  She remains in atrial fibrillation, I think that the atrial fibrillation is likely permanent at this point.  - I will have her stop amiodarone. Will check LFTs and TSH today.  - Continue Toprol XL 100 mg bid.  - Continue Eliquis, will check CBC today with hematochezia this summer (likely from internal hemorrhoids, it appears).  3. CAD: Mild, nonobstructive by cath 07/2013. No chest pain.  - No ASA given Eliquis use.   - Continue crestor 40 mg daily.  4. Bilateral carpal tunnel symptoms: Likely related to amyloidosis.  5. OSA: She says that she cannot tolerate CPAP along with her tinnitus.   Recommended follow-up:  3 wks with NP/PA to reassess volume and renal function.   Signed, [name], MD  03/01/2020  [facility] [address] [facility] and [place] [address] [phone] (office) [phone] (fax)

## 2020-03-01 NOTE — Patient Instructions (Addendum)
Labs done today. We will contact you only if your labs are abnormal.  STOP Amiodarone.   INCREASE Lasix 60mg (1 & 1/2 tablets) every morning and 40mg (1 tablet) every evening.  START Potassium 60meq(1 tablet) by mouth daily.  No other medication changes were made. Please continue all other medications as prescribed.  You have been referred to Dr. Broadus John for Cardiac Amyloidosis. His office will contact you to schedule an appointment.   Your physician recommends that you schedule a follow-up appointment in: 3-4 weeks with our [facility].(here in office)  If you have any questions or concerns before your next appointment please send [place] a message through [place] or call our office at [phone].    TO LEAVE A MESSAGE FOR THE NURSE SELECT OPTION 2, PLEASE LEAVE A MESSAGE INCLUDING: . YOUR NAME . DATE OF BIRTH . CALL BACK NUMBER . REASON FOR CALL**this is important as we prioritize the call backs  [place] AS LONG AS YOU CALL BEFORE 4:00 PM  Your physician has requested that you have an echocardiogram. Echocardiography is a painless test that uses sound waves to create images of your heart. It provides your doctor with information about the size and shape of your heart and how well your heart's chambers and valves are working. This procedure takes approximately one hour. There are no restrictions for this procedure.  At the [facility], you and your health needs are our priority. As part of our continuing mission to provide you with exceptional heart care, we have created designated Provider Care Teams. These Care Teams include your primary Cardiologist (physician) and Advanced Practice Providers (APPs- Physician Assistants and Nurse Practitioners) who all work together to provide you with the care you need, when you need it.   You may see any of the following providers on your designated Care Team at your next follow up: [name] Dr Glori Bickers . Dr [name] . Darrick Grinder, NP . Lyda Jester, PA . Audry Riles, PharmD   Please be sure to bring in all your medications bottles to every appointment.

## 2020-03-05 ENCOUNTER — Telehealth (HOSPITAL_COMMUNITY): Payer: Self-pay

## 2020-03-05 MED ORDER — AMLODIPINE BESYLATE 5 MG PO TABS: 5.0000 mg | ORAL_TABLET | Freq: Every day | ORAL | 3 refills | Status: DC

## 2020-03-05 NOTE — Telephone Encounter (Signed)
Patient advised and verbalized understanding. Med list updated. Patient has appt with Neuro in November

## 2020-03-05 NOTE — Telephone Encounter (Signed)
-----   Message from [name], MD sent at 03/02/2020 10:45 AM EDT ----- Thanks, Let's get her sent back to neurology for polyneuropathy evaluation for TTR amyloidosis.  ----- Message ----- From: Orma Render, RPH-CPP Sent: 03/02/2020   8:28 AM EDT To: [name], MD, Scarlette Calico, RN, #  So I see where she saw neurology in April 2021. However, they did not document FAP or PND score which will be needed for [employer] approval. Additionally, they do not even mention that she has TTR polyneuropathy in her note. I think before we send in the [employer] start form, we need to have them evaluate her again and document those things. Otherwise, I think it will be denied.   Heather or Philicia - can one of you have her follow up with neurology (She might need a new referral since they did not evaluate her for TTR polyneuropathy last time, only vertigo and schwannoma, I'm not sure how that works). Once that is documented, I can submit the paperwork.   Thanks,  Lauren ----- Message ----- From: [name], MD Sent: 03/01/2020   4:02 PM EDT To: Scarlette Calico, RN, Philicia R Branch, CMA, #  1. Please have Mrs Trumbo stop lisinopril and start amlodipine 5 mg daily with elevated creatinine.  2. Please start work on patisiran for genetic TTR amyloidosis with neuropathy for Mrs Sazama.

## 2020-03-07 ENCOUNTER — Other Ambulatory Visit (HOSPITAL_COMMUNITY): Payer: Self-pay | Admitting: *Deleted

## 2020-03-07 ENCOUNTER — Telehealth (HOSPITAL_COMMUNITY): Payer: Self-pay | Admitting: *Deleted

## 2020-03-07 DIAGNOSIS — E854 Organ-limited amyloidosis: Secondary | ICD-10-CM

## 2020-03-07 DIAGNOSIS — I43 Cardiomyopathy in diseases classified elsewhere: Secondary | ICD-10-CM

## 2020-03-07 MED ORDER — POTASSIUM CHLORIDE CRYS ER 10 MEQ PO TBCR: 20.0000 meq | EXTENDED_RELEASE_TABLET | Freq: Every day | ORAL | Status: DC

## 2020-03-07 NOTE — Telephone Encounter (Signed)
-----   Message from [name], MD sent at 03/02/2020 10:45 AM EDT ----- Thanks, Let's get her sent back to neurology for polyneuropathy evaluation for TTR amyloidosis.  ----- Message ----- From: Orma Render, RPH-CPP Sent: 03/02/2020   8:28 AM EDT To: [name], MD, Scarlette Calico, RN, #  So I see where she saw neurology in April 2021. However, they did not document FAP or PND score which will be needed for [employer] approval. Additionally, they do not even mention that she has TTR polyneuropathy in her note. I think before we send in the [employer] start form, we need to have them evaluate her again and document those things. Otherwise, I think it will be denied.   Sunya Humbarger or Philicia - can one of you have her follow up with neurology (She might need a new referral since they did not evaluate her for TTR polyneuropathy last time, only vertigo and schwannoma, I'm not sure how that works). Once that is documented, I can submit the paperwork.   Thanks,  Lauren ----- Message ----- From: [name], MD Sent: 03/01/2020   4:02 PM EDT To: Scarlette Calico, RN, Philicia R Branch, CMA, #  1. Please have Mrs Rozman stop lisinopril and start amlodipine 5 mg daily with elevated creatinine.  2. Please start work on patisiran for genetic TTR amyloidosis with neuropathy for Mrs Bayly.

## 2020-03-07 NOTE — Telephone Encounter (Signed)
Referral placed.

## 2020-03-13 ENCOUNTER — Ambulatory Visit (HOSPITAL_COMMUNITY)
Admission: RE | Admit: 2020-03-13 | Discharge: 2020-03-13 | Disposition: A | Discharge status: Home / Self Care | Payer: Medicare HMO | Source: Ambulatory Visit | Attending: Cardiology | Admitting: Cardiology

## 2020-03-13 ENCOUNTER — Other Ambulatory Visit: Payer: Self-pay

## 2020-03-13 DIAGNOSIS — I5022 Chronic systolic (congestive) heart failure: Secondary | ICD-10-CM | POA: Insufficient documentation

## 2020-03-13 LAB — BASIC METABOLIC PANEL
Anion gap: 10 (ref 5–15)
BUN: 16 mg/dL (ref 8–23)
CO2: 28 mmol/L (ref 22–32)
Calcium: 9.5 mg/dL (ref 8.9–10.3)
Chloride: 103 mmol/L (ref 98–111)
Creatinine, Ser: 1.36 mg/dL — ABNORMAL HIGH (ref 0.44–1.00)
GFR, Estimated: 44 mL/min — ABNORMAL LOW (ref >60)
Glucose, Bld: 104 mg/dL — ABNORMAL HIGH (ref 70–99)
Potassium: 4.3 mmol/L (ref 3.5–5.1)
Sodium: 141 mmol/L (ref 135–145)

## 2020-03-14 ENCOUNTER — Other Ambulatory Visit (HOSPITAL_COMMUNITY): Payer: Self-pay | Admitting: Cardiology

## 2020-03-14 DIAGNOSIS — I4891 Unspecified atrial fibrillation: Secondary | ICD-10-CM

## 2020-03-18 ENCOUNTER — Other Ambulatory Visit: Payer: Self-pay | Admitting: Cardiology

## 2020-03-27 ENCOUNTER — Encounter (HOSPITAL_COMMUNITY): Payer: Self-pay

## 2020-03-27 ENCOUNTER — Ambulatory Visit (HOSPITAL_BASED_OUTPATIENT_CLINIC_OR_DEPARTMENT_OTHER)
Admission: RE | Admit: 2020-03-27 | Discharge: 2020-03-27 | Disposition: A | Discharge status: Home / Self Care | Payer: Medicare HMO | Source: Ambulatory Visit | Attending: Cardiology | Admitting: Cardiology

## 2020-03-27 ENCOUNTER — Other Ambulatory Visit: Payer: Self-pay

## 2020-03-27 ENCOUNTER — Ambulatory Visit (HOSPITAL_COMMUNITY)
Admission: RE | Admit: 2020-03-27 | Discharge: 2020-03-27 | Disposition: A | Discharge status: Home / Self Care | Payer: Medicare HMO | Source: Ambulatory Visit | Attending: Physician Assistant | Admitting: Physician Assistant

## 2020-03-27 VITALS — BP 124/76 | HR 97 | Wt 387.0 lb

## 2020-03-27 DIAGNOSIS — I5022 Chronic systolic (congestive) heart failure: Secondary | ICD-10-CM

## 2020-03-27 DIAGNOSIS — I43 Cardiomyopathy in diseases classified elsewhere: Secondary | ICD-10-CM | POA: Insufficient documentation

## 2020-03-27 DIAGNOSIS — Z6841 Body Mass Index (BMI) 40.0 and over, adult: Secondary | ICD-10-CM | POA: Insufficient documentation

## 2020-03-27 DIAGNOSIS — I350 Nonrheumatic aortic (valve) stenosis: Secondary | ICD-10-CM | POA: Insufficient documentation

## 2020-03-27 DIAGNOSIS — Z79899 Other long term (current) drug therapy: Secondary | ICD-10-CM | POA: Insufficient documentation

## 2020-03-27 DIAGNOSIS — G4733 Obstructive sleep apnea (adult) (pediatric): Secondary | ICD-10-CM | POA: Diagnosis not present

## 2020-03-27 DIAGNOSIS — I110 Hypertensive heart disease with heart failure: Secondary | ICD-10-CM | POA: Diagnosis present

## 2020-03-27 DIAGNOSIS — I2510 Atherosclerotic heart disease of native coronary artery without angina pectoris: Secondary | ICD-10-CM | POA: Diagnosis not present

## 2020-03-27 DIAGNOSIS — I480 Paroxysmal atrial fibrillation: Secondary | ICD-10-CM | POA: Insufficient documentation

## 2020-03-27 DIAGNOSIS — I428 Other cardiomyopathies: Secondary | ICD-10-CM | POA: Diagnosis not present

## 2020-03-27 DIAGNOSIS — Z885 Allergy status to narcotic agent status: Secondary | ICD-10-CM | POA: Diagnosis not present

## 2020-03-27 DIAGNOSIS — Z888 Allergy status to other drugs, medicaments and biological substances status: Secondary | ICD-10-CM | POA: Diagnosis not present

## 2020-03-27 DIAGNOSIS — Z7901 Long term (current) use of anticoagulants: Secondary | ICD-10-CM | POA: Diagnosis not present

## 2020-03-27 DIAGNOSIS — I5042 Chronic combined systolic (congestive) and diastolic (congestive) heart failure: Secondary | ICD-10-CM | POA: Diagnosis not present

## 2020-03-27 DIAGNOSIS — G5603 Carpal tunnel syndrome, bilateral upper limbs: Secondary | ICD-10-CM | POA: Diagnosis not present

## 2020-03-27 DIAGNOSIS — E854 Organ-limited amyloidosis: Secondary | ICD-10-CM | POA: Diagnosis not present

## 2020-03-27 LAB — ECHOCARDIOGRAM COMPLETE
AR max vel: 1.31 cm2
AV Area VTI: 1.07 cm2
AV Area mean vel: 1.27 cm2
AV Mean grad: 17 mmHg
AV Peak grad: 27.2 mmHg
Ao pk vel: 2.61 m/s
Area-P 1/2: 4.22 cm2
Calc EF: 52.9 %
S' Lateral: 4 cm
Single Plane A2C EF: 58.3 %
Single Plane A4C EF: 50.6 %

## 2020-03-27 LAB — BASIC METABOLIC PANEL
Anion gap: 10 (ref 5–15)
BUN: 23 mg/dL (ref 8–23)
CO2: 27 mmol/L (ref 22–32)
Calcium: 9.6 mg/dL (ref 8.9–10.3)
Chloride: 103 mmol/L (ref 98–111)
Creatinine, Ser: 1.46 mg/dL — ABNORMAL HIGH (ref 0.44–1.00)
GFR, Estimated: 40 mL/min — ABNORMAL LOW (ref >60)
Glucose, Bld: 97 mg/dL (ref 70–99)
Potassium: 4.2 mmol/L (ref 3.5–5.1)
Sodium: 140 mmol/L (ref 135–145)

## 2020-03-27 LAB — HEMOGLOBIN A1C
Hgb A1c MFr Bld: 6 % — ABNORMAL HIGH (ref 4.8–5.6)
Mean Plasma Glucose: 125.5 mg/dL

## 2020-03-27 MED ORDER — SPIRONOLACTONE 25 MG PO TABS: 25.0000 mg | ORAL_TABLET | Freq: Every day | ORAL | 3 refills | Status: DC

## 2020-03-27 NOTE — Progress Notes (Signed)
Date:  03/27/2020   ID:  Kimberly [name], DOB [birth year], MRN [id number]   Provider location: [facility] Type of Visit: Established patient   PCP:  Windell Hummingbird, PA-C  Cardiologist:  Dr. Aundra Dubin   History of Present Illness: Kimberly [name] is a 64 y.o. female who has a history of paroxysmal atrial fibrillation, obesity, and nonischemic cardiomyopathy.  She was admitted in 3/15 at [facility] with atrial fibrillation and CHF.  TEE was done in preparation for DCCV, showing EF 25-30% but there was LA thrombus so DCCV was not done.  Plan was for anticoagulation x 3 months followed by TEE-guided DCCV in the OR (due to patient's body habitus).  LHC was also done, showing mild nonobstructive CAD.  Patient was diuresed with IV Lasix in the hospital and started on apixaban.   Patient had TEE again in 6/15, showing EF 35-40% with diffuse hypokinesis and no LAA thrombus.  She was cardioverted to NSR (with difficulty).  I started her at that time on amiodarone.  She then went back into atrial fibrillation.  After she had been loaded on amiodarone, cardioverted her again in 7/15 to NSR.    She had gone back into atrial fibrillation in 1/18 and amiodarone was increased to bid in preparation for DCCV. However, she spontaneously converted to NSR.  PYP scan in 4/19 was grade 3, strongly suggestive of TTR amyloidosis.  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  Echo 8/19 showed EF 60%, moderate LVH, mild aortic stenosis.  Of note, she has carpal tunnel syndrome.  She was started on tafamidis.   She went back into atrial fibrillation and had DCCV in 2/20 but was back in atrial fibrillation a few weeks later when she went to atrial fibrillation clinic.  Dr. Rayann Heman thought that morbid obesity precluded atrial fibrillation ablation and concerns about compliance led them not to start her on Tikosyn.  Instead, it was recommended that she start on CPAP for her OSA,  lose weight, and re-attempt DCCV while on amiodarone.   Unfortunately, she had been lost to f/u in the [facility] for ~1.5 years.  She was still taking amiodarone 200 mg bid but has remained in persistent atrial fibrillation.  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  In 7/21, she developed hematochezia and had EGD/colonoscopy which were unrevealing except for internal hemorrhoids.   Recently seen in clinic by Dr. Aundra Dubin last month and was noted to be fluid overloaded. He increased Lasix to 60 qam/40 qpm. Given elevated SCr at 2, he stopped lisinopril and added amlodipine for BP. She had repeat labs 1 week after med change and SCr has improved, down to 1.3.  She returns back to clinic today for repeat assessment of volume status. Dr. Aundra Dubin had also recommended a repeat echo. This was done today, results pending.  She remains fluid overloaded. Still w/ 1+ bilateral LEE. She did increase her lasix as instructed but stopped taking her spiro. She thought she was supposed to discontinued. She continues w/ mild exertional dyspnea but no resting symptoms. Denies CP.    ECG (personally reviewed): not performed   Labs (4/19): LFTs normal, K 4, creatinine 1.24, TSH normal, immunofixation normal Labs (8/19): TSH normal, K 3.8, creatinine 1.27, LFTs normal, TSH normal Labs (11/19): K 3.8, creatinine 1.21 Labs (2/20): LDL 114, hgb 10.5 Labs (3/20): K 4.4, creatinine 1.42 Labs (  4/20): K 4.2, creatinine 1.23 Labs (7/21): K 3.8, creatinine 1.23 Labs (8/21): hgb 11.2 Labs (10/21): creatinine 2.1 Labs (10/21): creatinine 1.36, K 4.3   PMH: 1. Obese 2. Atrial fibrillation: Paroxysmal => chronic.  Unable to cardiovert in 3/15 due to LAA thrombus noted on TEE. TEE in 6/15 without LAA thrombus.  Patient had cardioversion to NSR with difficulty but back in atrial fibrillation by 7/15 appt.  She was started on amiodarone and  cardioverted in 7/15.   - DCCV in 2/20 but back in atrial fibrillation by 3/20, has been in atrial fibrillation since that time.  3. H/o TKR 4. Nonischemic cardiomyopathy: TEE (3/15) with EF 25-30%, moderate MR, PA systolic pressure 51 mmHg, moderate to severe TR, LA thrombus was noted.   LHC (3/15) with 50% stenosis small PLV.  TEE (6/15) with EF 35-40%, diffuse hypokinesis, mild MR, moderate TR, +PFO, no LA appendage thrombus.  - Echo (10/15) with EF 60-65%.   - ECHO 06/10/2016 EF 60-65% Mild AS - Echo (8/19): EF 60%, moderate LVH, mild aortic stenosis.  - PYP scan in 4/19 was grade 3, strongly suggestive of transthyretin amyloidosis.  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  5. CAD: Nonobstructive.  LHC (3/15) with 50% stenosis small PLV. 6. PFO 7. Left Breast mass: Was taken for lumpectomy in 1/18 but mass had resolved.  8. Aortic stenosis: Mild.  9. Carpal tunnel syndrome.  10. Vestibular schwannoma s/p gamma knife surgery at [facility].  11. OSA: Not currently using CPAP.  12. Hematochezia: C-scope/EGD in 7/21 showed only internal hemorrhoids.  13. 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    Current Outpatient Medications  Medication Sig Dispense Refill  . acetaminophen (TYLENOL) 650 MG CR tablet Take 1,300 mg by mouth every 8 (eight) hours as needed for pain.     [name] amLODipine (NORVASC) 5 MG tablet Take 5 mg by mouth daily.    . Cholecalciferol (VITAMIN D-3) 5000 UNITS TABS Take 5,000 Units by mouth daily.    [name] ELIQUIS 5 MG TABS tablet TAKE 1 TABLET (5 MG TOTAL) BY MOUTH 2 (TWO) TIMES DAILY. 60 tablet 5  . Ferrous Sulfate (PX IRON) 27 MG TABS Take 27 mg by mouth 2 (two) times daily.     . fexofenadine (ALLEGRA) 180 MG tablet Take 180 mg by mouth daily.    . furosemide (LASIX) 40 MG tablet Take 1.5 tablets (60 mg total) by mouth every morning AND 1 tablet (40 mg total) every evening. 75 tablet 5  . meclizine (ANTIVERT) 25 MG tablet Take 25 mg by mouth as needed for dizziness.     . metoCLOPramide (REGLAN) 5 MG tablet Take 5 mg by mouth as needed for nausea.    . metoprolol succinate (TOPROL-XL) 100 MG 24 hr tablet TAKE 1 TABLET (100 MG TOTAL) BY MOUTH 2 (TWO) TIMES DAILY. NEEDS APPT FOR FURTHER REFILLS 180 tablet 0  . nitroGLYCERIN (NITROSTAT) 0.4 MG SL tablet Place 1 tablet (0.4 mg total) under the tongue every 5 (five) minutes as needed for chest pain. 30 tablet 1  . Omega-3 1000 MG CAPS Take by mouth.    . potassium chloride SA (KLOR-CON) 10 MEQ tablet Take 2 tablets (20 mEq total) by mouth daily. 180 tablet .  [name] RESTASIS 0.05 % ophthalmic emulsion     . rosuvastatin (CRESTOR) 40 MG tablet Take 40 mg by mouth at bedtime.  6   No current facility-administered medications for this encounter.    Allergies:   Tramadol  Social History:  The patient  reports that she has never smoked. She has never used smokeless tobacco. She reports that she does not drink alcohol and does not use drugs.   Family History:  The patient's family history includes CVA in her mother.   ROS:  Please see the history of present illness.   All other systems are personally reviewed and negative.   Vitals:   BP 124/76   Pulse 97   Wt (!) 175.5 kg (387 lb)   SpO2 98%   BMI 62.46 kg/m  PHYSICAL EXAM: General:  Well appearing, obese. No respiratory difficulty HEENT: normal Neck: supple.  JVD ~ 8 cm. Carotids 2+ bilat; no bruits. No lymphadenopathy or thyromegaly appreciated. Cor: PMI nondisplaced. Irregularly irregular rhythm, regular rate. No rubs, gallops or murmurs. Lungs: clear Abdomen: soft, nontender, nondistended. No hepatosplenomegaly. No bruits or masses. Good bowel sounds. Extremities: no cyanosis, clubbing, rash, 1+ bilateral LE pretibial edema Neuro: alert & oriented x 3, cranial nerves grossly intact. moves all 4 extremities w/o difficulty. Affect pleasant.    Recent Labs: 03/01/2020: ALT 14; Hemoglobin 10.0; Platelets 225; TSH 1.404 03/13/2020: BUN 16; Creatinine, Ser  1.36; Potassium 4.3; Sodium 141  Personally reviewed   Wt Readings from Last 3 Encounters:  03/27/20 (!) 175.5 kg (387 lb)  03/01/20 (!) 176.6 kg (389 lb 6.4 oz)  08/29/19 (!) 175.5 kg (387 lb)     ASSESSMENT AND PLAN:  1. 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  EF 35-40% on TEE in 6/15 but then EF back to 60-65% by 10/15 and remained normal on last echo in 8/19.  LHC without significant coronary disease in 3/15.  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 She remains fluid overloaded on exam. NYHA class III symptoms.   - Continue on higher dose of Lasix >> 60 qam/40 qpm  - Plan to restart Arlyce Harman 25 mg daily  - Check BMP today and again in 7 days. Check Hgb A1c and plan possible SGLT2i addition at f/u visit, if still fluid overloaded.  - no longer on ACEi due to elevated SCr (rose to 2.0, improved down to 1.3 after discontinuation)   - Continue Toprol XL 100 mg BID - Echo repeated today. Interpretation pending  2. 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  Atrial fibrillation and carpal tunnel syndrome, both of which she has, go along with amyloidosis.  Her history of orthostatic symptoms as well as symptoms in toes and feet consistent with peripheral neuropathy are also likely related to amyloidosis.  - Continue tafamidis.   - Given hereditary TTR amyloidosis. We will see if I can get her started on patisiran as well to help with her peripheral neuropathy.  - She has been refer to Dr. Broadus John for genetic counseling.   2. Atrial fibrillation: Last DCCV in 2/20 but back in atrial fibrillation since then.  Dr. Rayann Heman saw, decided against atrial fibrillation ablation due to morbid obesity.  EP decided against Tikosyn due to compliance  concerns. Recommendation was to continue amiodarone, start CPAP, and retry DCCV while compliant with CPAP.  However, she is not using CPAP due to her tinnitus. Her afib is now permanent. Amiodarone was discontinued last visit 10/21. She is well rate controlled w/  blocker - Continue Toprol XL 100 mg bid.  - Continue Eliquis. Recent H/H  ok on CBC. Hgb 10.5 c/w baseline.  3. CAD: Mild, nonobstructive by cath 07/2013. Stable w/o CP   - No ASA given Eliquis use.   - Continue crestor 40 mg daily.  4. Bilateral carpal tunnel symptoms: Likely related to amyloidosis.  5. OSA: She says that she cannot tolerate CPAP along with her tinnitus.   F/u in 2-3 weeks to reassess fluid status. Consider addition of an SGLT2i.   Signed, Lyda Jester, PA-C  03/27/2020  [facility] [address] [facility] [place] [address] [phone] (office) [phone] (fax)

## 2020-03-27 NOTE — Patient Instructions (Signed)
START Spironolactone 25 mg, one tab daily  Labs today We will only contact you if something comes back abnormal or we need to make some changes. Otherwise no news is good news!  Your physician recommends that you schedule a follow-up appointment in: 2-3 weeks  in the [facility] Pract[facility]ers (PA/NP) Clinic    If you have any questions or concerns before your next appointment please send [place] a message through [place] or call our office at [phone].    TO LEAVE A MESSAGE FOR THE NURSE SELECT OPTION 2, PLEASE LEAVE A MESSAGE INCLUDING: . YOUR NAME . DATE OF BIRTH . CALL BACK NUMBER . REASON FOR CALL**this is important as we prioritize the call backs  YOU WILL RECEIVE A CALL BACK THE SAME DAY AS LONG AS YOU CALL BEFORE 4:00 PM

## 2020-03-28 ENCOUNTER — Other Ambulatory Visit (HOSPITAL_COMMUNITY): Payer: Self-pay

## 2020-03-28 MED ORDER — FUROSEMIDE 40 MG PO TABS: ORAL_TABLET | ORAL | 5 refills | Status: DC

## 2020-03-28 MED ORDER — AMLODIPINE BESYLATE 5 MG PO TABS
5.0000 mg | ORAL_TABLET | Freq: Every day | ORAL | 6 refills | Status: DC
Start: 2020-03-28 — End: 2020-03-29

## 2020-03-28 MED ORDER — APIXABAN 5 MG PO TABS: ORAL_TABLET | ORAL | 5 refills | Status: DC

## 2020-03-29 ENCOUNTER — Other Ambulatory Visit (HOSPITAL_COMMUNITY): Payer: Self-pay

## 2020-03-29 MED ORDER — METOPROLOL SUCCINATE ER 100 MG PO TB24
100.0000 mg | ORAL_TABLET | Freq: Two times a day (BID) | ORAL | 0 refills | Status: DC
Start: 2020-03-29 — End: 2020-06-14

## 2020-03-29 MED ORDER — AMLODIPINE BESYLATE 5 MG PO TABS
5.0000 mg | ORAL_TABLET | Freq: Every day | ORAL | 6 refills | Status: DC
Start: 2020-03-29 — End: 2020-05-31

## 2020-04-02 ENCOUNTER — Other Ambulatory Visit (HOSPITAL_COMMUNITY): Payer: Self-pay | Admitting: *Deleted

## 2020-04-02 MED ORDER — SPIRONOLACTONE 25 MG PO TABS: 25.0000 mg | ORAL_TABLET | Freq: Every day | ORAL | 3 refills | Status: DC

## 2020-04-02 MED ORDER — ROSUVASTATIN CALCIUM 40 MG PO TABS
40.0000 mg | ORAL_TABLET | Freq: Every day | ORAL | 3 refills | Status: DC
Start: 2020-04-02 — End: 2022-11-17

## 2020-04-03 ENCOUNTER — Telehealth (HOSPITAL_COMMUNITY): Payer: Self-pay | Admitting: Cardiology

## 2020-04-03 DIAGNOSIS — I5022 Chronic systolic (congestive) heart failure: Secondary | ICD-10-CM

## 2020-04-03 NOTE — Telephone Encounter (Signed)
-----   Message from Consuelo Pandy, [place] sent at 03/27/2020  3:59 PM EST ----- Scr and K ok. Ok to start [place] 25 mg daily. Repeat BMP in 1 week.

## 2020-04-03 NOTE — Telephone Encounter (Signed)
[phone] (H) patient aware and recheck labs 11/22

## 2020-04-09 ENCOUNTER — Other Ambulatory Visit (HOSPITAL_COMMUNITY): Payer: Medicare HMO

## 2020-04-19 ENCOUNTER — Encounter (HOSPITAL_COMMUNITY): Payer: Medicare HMO

## 2020-04-23 ENCOUNTER — Encounter (HOSPITAL_COMMUNITY): Payer: Medicare HMO

## 2020-05-31 ENCOUNTER — Encounter (HOSPITAL_COMMUNITY): Payer: Self-pay

## 2020-05-31 ENCOUNTER — Other Ambulatory Visit: Payer: Self-pay

## 2020-05-31 ENCOUNTER — Ambulatory Visit (HOSPITAL_COMMUNITY)
Admission: RE | Admit: 2020-05-31 | Discharge: 2020-05-31 | Disposition: A | Discharge status: Home / Self Care | Payer: Medicare HMO | Source: Ambulatory Visit | Attending: Family Medicine | Admitting: Family Medicine

## 2020-05-31 VITALS — BP 102/60 | HR 112 | Wt 374.6 lb

## 2020-05-31 DIAGNOSIS — I2510 Atherosclerotic heart disease of native coronary artery without angina pectoris: Secondary | ICD-10-CM | POA: Diagnosis not present

## 2020-05-31 DIAGNOSIS — I4891 Unspecified atrial fibrillation: Secondary | ICD-10-CM | POA: Diagnosis not present

## 2020-05-31 DIAGNOSIS — I480 Paroxysmal atrial fibrillation: Secondary | ICD-10-CM | POA: Diagnosis not present

## 2020-05-31 DIAGNOSIS — E854 Organ-limited amyloidosis: Secondary | ICD-10-CM | POA: Insufficient documentation

## 2020-05-31 DIAGNOSIS — I428 Other cardiomyopathies: Secondary | ICD-10-CM | POA: Diagnosis not present

## 2020-05-31 DIAGNOSIS — G4733 Obstructive sleep apnea (adult) (pediatric): Secondary | ICD-10-CM

## 2020-05-31 DIAGNOSIS — I5022 Chronic systolic (congestive) heart failure: Secondary | ICD-10-CM | POA: Insufficient documentation

## 2020-05-31 DIAGNOSIS — Z79899 Other long term (current) drug therapy: Secondary | ICD-10-CM | POA: Diagnosis not present

## 2020-05-31 DIAGNOSIS — G5603 Carpal tunnel syndrome, bilateral upper limbs: Secondary | ICD-10-CM

## 2020-05-31 DIAGNOSIS — Z7901 Long term (current) use of anticoagulants: Secondary | ICD-10-CM | POA: Diagnosis not present

## 2020-05-31 DIAGNOSIS — I43 Cardiomyopathy in diseases classified elsewhere: Secondary | ICD-10-CM | POA: Diagnosis not present

## 2020-05-31 LAB — CBC
HCT: 36.2 % (ref 36.0–46.0)
Hemoglobin: 12 g/dL (ref 12.0–15.0)
MCH: 29.2 pg (ref 26.0–34.0)
MCHC: 33.1 g/dL (ref 30.0–36.0)
MCV: 88.1 fL (ref 80.0–100.0)
Platelets: 152 10*3/uL (ref 150–400)
RBC: 4.11 MIL/uL (ref 3.87–5.11)
RDW: 14.2 % (ref 11.5–15.5)
WBC: 3.4 10*3/uL — ABNORMAL LOW (ref 4.0–10.5)
nRBC: 0 % (ref 0.0–0.2)

## 2020-05-31 LAB — BASIC METABOLIC PANEL
Anion gap: 13 (ref 5–15)
BUN: 23 mg/dL (ref 8–23)
CO2: 23 mmol/L (ref 22–32)
Calcium: 9.3 mg/dL (ref 8.9–10.3)
Chloride: 101 mmol/L (ref 98–111)
Creatinine, Ser: 1.72 mg/dL — ABNORMAL HIGH (ref 0.44–1.00)
GFR, Estimated: 33 mL/min — ABNORMAL LOW (ref >60)
Glucose, Bld: 88 mg/dL (ref 70–99)
Potassium: 5.1 mmol/L (ref 3.5–5.1)
Sodium: 137 mmol/L (ref 135–145)

## 2020-05-31 MED ORDER — EMPAGLIFLOZIN 10 MG PO TABS
10.0000 mg | ORAL_TABLET | Freq: Every day | ORAL | 11 refills | Status: DC
Start: 2020-05-31 — End: 2020-06-21

## 2020-05-31 MED ORDER — EMPAGLIFLOZIN 10 MG PO TABS: 10.0000 mg | ORAL_TABLET | Freq: Every day | ORAL | 11 refills | Status: DC

## 2020-05-31 NOTE — Patient Instructions (Signed)
START Jardiance 10 mg, one tab daily   Labs today We will only contact you if something comes back abnormal or we need to make some changes. Otherwise no news is good news!  Labs needed in 7-10 days     Your physician recommends that you schedule a follow-up appointment in: 4-6 weeks  in the Advanced Pract[facility]ers (PA/NP) Clinic    If you have any questions or concerns before your next appointment please send [place] a message through [place] or call our office at [phone].    TO LEAVE A MESSAGE FOR THE NURSE SELECT OPTION 2, PLEASE LEAVE A MESSAGE INCLUDING: . YOUR NAME . DATE OF BIRTH . CALL BACK NUMBER . REASON FOR CALL**this is important as we prioritize the call backs  YOU WILL RECEIVE A CALL BACK THE SAME DAY AS LONG AS YOU CALL BEFORE 4:00 PM

## 2020-05-31 NOTE — Progress Notes (Addendum)
Date:  05/31/2020   PCP:  Windell Hummingbird, PA-C  HF Cardiologist:  Dr. Aundra Dubin   History of Present Illness: Kimberly [name] is a 65 y.o. female who has a history of paroxysmal atrial fibrillation, obesity, and nonischemic cardiomyopathy.  She was admitted in 3/15 at [facility] with atrial fibrillation and CHF.  TEE was done in preparation for DCCV, showing EF 25-30% but there was LA thrombus so DCCV was not done.  Plan was for anticoagulation x 3 months followed by TEE-guided DCCV in the OR (due to patient's body habitus).  LHC was also done, showing mild nonobstructive CAD.  Patient was diuresed with IV Lasix in the hospital and started on apixaban.   Patient had TEE again in 6/15, showing EF 35-40% with diffuse hypokinesis and no LAA thrombus.  She was cardioverted to NSR (with difficulty).  I started her at that time on amiodarone.  She then went back into atrial fibrillation.  After she had been loaded on amiodarone, cardioverted her again in 7/15 to NSR.    She had gone back into atrial fibrillation in 1/18 and amiodarone was increased to bid in preparation for DCCV. However, she spontaneously converted to NSR.  PYP scan in 4/19 was grade 3, strongly suggestive of TTR amyloidosis.  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  Echo 8/19 showed EF 60%, moderate LVH, mild aortic stenosis.  Of note, she has carpal tunnel syndrome.  She was started on tafamidis.   She went back into atrial fibrillation and had DCCV in 2/20 but was back in atrial fibrillation a few weeks later when she went to atrial fibrillation clinic.  Dr. Rayann Heman thought that morbid obesity precluded atrial fibrillation ablation and concerns about compliance led them not to start her on Tikosyn.  Instead, it was recommended that she start on CPAP for her OSA, lose weight, and re-attempt DCCV while on amiodarone.   Unfortunately, she had been lost to f/u in the [facility] for ~1.5 years.  She was still  taking amiodarone 200 mg bid but has remained in persistent atrial fibrillation.  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  In 7/21, she developed hematochezia and had EGD/colonoscopy which were unrevealing except for internal hemorrhoids.   Recently seen in clinic by Dr. Aundra Dubin last month and was noted to be fluid overloaded. He increased Lasix to 60 qam/40 qpm. Given elevated SCr at 2, he stopped lisinopril and added amlodipine for BP. She had repeat labs 1 week after med change and SCr has improved, down to 1.3.  She returned back to clinic 11/21 for repeat assessment of volume status. She remained fluid overloaded. She increased her lasix as instructed but stopped taking her spiro. Spiro 25 mg restarted, and lasix 60 mg qam/40 mg q pm continued. Has not taken lasix in 7 days.  Today she returns for HF follow up. Has some chest congestion. Gets SOB walking short distance.  Denies worsening SOB, CP,edema, or PND/Orthopnea. Chronic dizziness due to tinnitus and vestibular schwannoma. Appetite ok. No fever or chills. Not weighing at home. Has been out of her lasix for the past 7 days. Weight down 13 lbs.  ECG (personally reviewed): afib 100 bpm (personally reviewed).  Labs (4/19): LFTs normal, K 4, creatinine 1.24, TSH normal, immunofixation normal Labs (8/19): TSH normal, K 3.8, creatinine 1.27, LFTs normal, TSH normal Labs (11/19): K 3.8, creatinine  1.21 Labs (2/20): LDL 114, hgb 10.5 Labs (3/20): K 4.4, creatinine 1.42 Labs (4/20): K 4.2, creatinine 1.23 Labs (7/21): K 3.8, creatinine 1.23 Labs (8/21): hgb 11.2 Labs (10/21): creatinine 2.1 Labs (10/21): creatinine 1.36, K 4.3   Labs (11/21): K 4.2, creatinine 1.46  PMH: 1. Obese 2. Atrial fibrillation: Paroxysmal => chronic.  Unable to cardiovert in 3/15 due to LAA thrombus noted on TEE. TEE in 6/15 without LAA thrombus.  Patient had cardioversion to NSR  with difficulty but back in atrial fibrillation by 7/15 appt.  She was started on amiodarone and cardioverted in 7/15.   - DCCV in 2/20 but back in atrial fibrillation by 3/20, has been in atrial fibrillation since that time.  3. H/o TKR 4. Nonischemic cardiomyopathy: TEE (3/15) with EF 25-30%, moderate MR, PA systolic pressure 51 mmHg, moderate to severe TR, LA thrombus was noted.   LHC (3/15) with 50% stenosis small PLV.  TEE (6/15) with EF 35-40%, diffuse hypokinesis, mild MR, moderate TR, +PFO, no LA appendage thrombus.  - Echo (10/15) with EF 60-65%.   - ECHO 06/10/2016 EF 60-65% Mild AS - Echo (8/19): EF 60%, moderate LVH, mild aortic stenosis.  - Echo (11/21): EF 55-60%, mild/mod AS - PYP scan in 4/19 was grade 3, strongly suggestive of transthyretin amyloidosis.  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  5. CAD: Nonobstructive.  LHC (3/15) with 50% stenosis small PLV. 6. PFO 7. Left Breast mass: Was taken for lumpectomy in 1/18 but mass had resolved.  8. Aortic stenosis: Mild.  9. Carpal tunnel syndrome.  10. Vestibular schwannoma s/p gamma knife surgery at [facility].  11. OSA: Not currently using CPAP.  12. Hematochezia: C-scope/EGD in 7/21 showed only internal hemorrhoids.  13. 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    Current Outpatient Medications  Medication Sig Dispense Refill  . acetaminophen (TYLENOL) 650 MG CR tablet Take 1,300 mg by mouth every 8 (eight) hours as needed for pain.     [name] apixaban (ELIQUIS) 5 MG TABS tablet TAKE 1 TABLET (5 MG TOTAL) BY MOUTH 2 (TWO) TIMES DAILY. 60 tablet 5  . Cholecalciferol (VITAMIN D-3) 5000 UNITS TABS Take 5,000 Units by mouth daily.    . Ferrous Sulfate 27 MG TABS Take 27 mg by mouth 2 (two) times daily.     . furosemide (LASIX) 40 MG tablet Take 1.5 tablets (60 mg total) by mouth every morning AND 1 tablet (40 mg total) every evening. 75 tablet 5  . metoCLOPramide (REGLAN) 5 MG tablet Take 5 mg by mouth as needed for nausea.     . metoprolol succinate (TOPROL-XL) 100 MG 24 hr tablet Take 1 tablet (100 mg total) by mouth 2 (two) times daily. Needs appt for further refills 180 tablet 0  . nitroGLYCERIN (NITROSTAT) 0.4 MG SL tablet Place 1 tablet (0.4 mg total) under the tongue every 5 (five) minutes as needed for chest pain. 30 tablet 1  . Omega-3 1000 MG CAPS Take by mouth.    . RESTASIS 0.05 % ophthalmic emulsion     . rosuvastatin (CRESTOR) 40 MG tablet Take 1 tablet (40 mg total) by mouth at bedtime. 90 tablet 3  . spironolactone (ALDACTONE) 25 MG tablet Take 1 tablet (25 mg total) by mouth daily. 90 tablet 3   No current facility-administered medications for this encounter.    Allergies:   Tramadol   Social History:  The patient  reports that she has never smoked. She has never used smokeless tobacco. She  reports that she does not drink alcohol and does not use drugs.   Family History:  The patient's family history includes CVA in her mother.   ROS:  Please see the history of present illness.   All other systems are personally reviewed and negative.   Vitals:   BP 102/60   Pulse (!) 112   Wt (!) 169.9 kg (374 lb 9.6 oz)   SpO2 94%   BMI 60.46 kg/m  PHYSICAL EXAM: General: NAD Neck: Thick, JVP~7, no thyromegaly or thyroid nodule.  Lungs: Clear to auscultation bilaterally with normal respiratory effort. CV: Nondisplaced PMI.  Heart irregular S1/S2, no S3/S4, 2/6 SEM RUSB. 1+ ankle edema.  No carotid bruit.  Normal pedal pulses.  Abdomen: Soft, nontender, no hepatosplenomegaly, no distention.  Skin: Intact without lesions or rashes.  Neurologic: Alert and oriented x 3.  Psych: Normal affect. Extremities: No clubbing or cyanosis.  HEENT: Normal.   Recent Labs: 03/01/2020: ALT 14; Hemoglobin 10.0; Platelets 225; TSH 1.404 03/27/2020: BUN 23; Creatinine, Ser 1.46; Potassium 4.2; Sodium 140  Personally reviewed   Wt Readings from Last 3 Encounters:  05/31/20 (!) 169.9 kg (374 lb 9.6 oz)  03/27/20  (!) 175.5 kg (387 lb)  03/01/20 (!) 176.6 kg (389 lb 6.4 oz)     ASSESSMENT AND PLAN: 1. 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  EF 35-40% on TEE in 6/15 but then EF back to 60-65% by 10/15 and remained normal on last echo in 8/19.  LHC without significant coronary disease in 3/15.  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 Echo 11/21 EF 55-60% mild/mod AS. She remains fluid overloaded on exam. NYHA class III symptoms.   - Resume Lasix 60 qam/40 qpm.  - Start Jardiance 10 mg daily. Counseled on GU warning signs. - Continue Spiro 25 mg daily.  - Continue Toprol XL 100 mg bid. - Check BMP today and again in 7-10 days.   - no longer on ACEi due to elevated SCr (rose to 2.0, improved down to 1.3 after discontinuation).  2. 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  Atrial fibrillation and carpal tunnel syndrome, both of which she has, go along with amyloidosis.  Her history of orthostatic symptoms as well as symptoms in toes and feet consistent with peripheral neuropathy are also likely related to amyloidosis.  - Continue tafamidis.   - Given hereditary TTR amyloidosis. We will see if I can get her started on patisiran as well to help with her peripheral neuropathy.  - She has been refer to Dr. Broadus John for genetic counseling.   3. Atrial fibrillation: Last DCCV in 2/20 but back in atrial fibrillation since then.  Dr. Rayann Heman saw, decided against atrial fibrillation ablation due to morbid obesity.  EP decided against Tikosyn due to compliance concerns. Recommendation was to continue amiodarone, start CPAP, and retry DCCV while compliant with CPAP.  However, she is not using CPAP due to her tinnitus. Her afib is now permanent. Amiodarone was  discontinued 10/21. She is well rate controlled w/  blocker. - Continue Toprol XL 100 mg bid.  - Having some bleeding with BMs, has history of hemmorhoids - Continue Eliquis. Recent H/H ok on CBC. Hgb 10.5 c/w baseline. Re-Check CBC. 4. CAD: Mild, nonobstructive by cath 07/2013. Stable w/o CP.   - No ASA given Eliquis  use.   - Continue crestor 40 mg daily.  5. Bilateral carpal tunnel symptoms: Likely related to amyloidosis.  6. OSA: She says that she cannot tolerate CPAP along with her tinnitus.   F/u in APP clinic 4-6 weeks to reassess fluid status.  Signed, Rafael Bihari, FNP  05/31/2020  [facility] [address] [facility] [place] [address] [phone] (office) [phone] (fax)

## 2020-06-01 ENCOUNTER — Telehealth (HOSPITAL_COMMUNITY): Payer: Self-pay | Admitting: Family Medicine

## 2020-06-01 DIAGNOSIS — I5022 Chronic systolic (congestive) heart failure: Secondary | ICD-10-CM

## 2020-06-01 NOTE — Addendum Note (Signed)
Addended by: Kerry Dory on: 06/01/2020 04:58 PM   Modules accepted: Orders

## 2020-06-01 NOTE — Telephone Encounter (Signed)
Spoke to [place] regarding her elevated kidney function on her lab work. Advised her to hold off on starting Jardiance until we have repeat blood work up next week. She was agreeable with plan and verbalized understanding. Allena Katz, FNP-BC

## 2020-06-01 NOTE — Telephone Encounter (Signed)
Orders placed.

## 2020-06-07 ENCOUNTER — Other Ambulatory Visit (HOSPITAL_COMMUNITY): Payer: Medicare HMO

## 2020-06-14 ENCOUNTER — Other Ambulatory Visit (HOSPITAL_COMMUNITY): Payer: Self-pay | Admitting: Cardiology

## 2020-06-21 ENCOUNTER — Ambulatory Visit (HOSPITAL_COMMUNITY)
Admission: RE | Admit: 2020-06-21 | Discharge: 2020-06-21 | Disposition: A | Discharge status: Home / Self Care | Payer: Medicare HMO | Source: Ambulatory Visit | Attending: Cardiology | Admitting: Cardiology

## 2020-06-21 ENCOUNTER — Other Ambulatory Visit: Payer: Self-pay

## 2020-06-21 ENCOUNTER — Encounter (HOSPITAL_COMMUNITY): Payer: Self-pay

## 2020-06-21 VITALS — BP 124/96 | HR 109 | Wt 389.0 lb

## 2020-06-21 DIAGNOSIS — I5042 Chronic combined systolic (congestive) and diastolic (congestive) heart failure: Secondary | ICD-10-CM | POA: Insufficient documentation

## 2020-06-21 DIAGNOSIS — G629 Polyneuropathy, unspecified: Secondary | ICD-10-CM | POA: Diagnosis not present

## 2020-06-21 DIAGNOSIS — G4733 Obstructive sleep apnea (adult) (pediatric): Secondary | ICD-10-CM | POA: Diagnosis not present

## 2020-06-21 DIAGNOSIS — H9319 Tinnitus, unspecified ear: Secondary | ICD-10-CM | POA: Insufficient documentation

## 2020-06-21 DIAGNOSIS — I480 Paroxysmal atrial fibrillation: Secondary | ICD-10-CM | POA: Insufficient documentation

## 2020-06-21 DIAGNOSIS — I2510 Atherosclerotic heart disease of native coronary artery without angina pectoris: Secondary | ICD-10-CM | POA: Diagnosis not present

## 2020-06-21 DIAGNOSIS — I4891 Unspecified atrial fibrillation: Secondary | ICD-10-CM

## 2020-06-21 DIAGNOSIS — I5022 Chronic systolic (congestive) heart failure: Secondary | ICD-10-CM | POA: Diagnosis not present

## 2020-06-21 DIAGNOSIS — Z7901 Long term (current) use of anticoagulants: Secondary | ICD-10-CM | POA: Diagnosis not present

## 2020-06-21 DIAGNOSIS — Z6841 Body Mass Index (BMI) 40.0 and over, adult: Secondary | ICD-10-CM | POA: Insufficient documentation

## 2020-06-21 DIAGNOSIS — I43 Cardiomyopathy in diseases classified elsewhere: Secondary | ICD-10-CM | POA: Insufficient documentation

## 2020-06-21 DIAGNOSIS — Z79899 Other long term (current) drug therapy: Secondary | ICD-10-CM | POA: Insufficient documentation

## 2020-06-21 DIAGNOSIS — I428 Other cardiomyopathies: Secondary | ICD-10-CM | POA: Insufficient documentation

## 2020-06-21 DIAGNOSIS — R0600 Dyspnea, unspecified: Secondary | ICD-10-CM | POA: Diagnosis not present

## 2020-06-21 DIAGNOSIS — E854 Organ-limited amyloidosis: Secondary | ICD-10-CM

## 2020-06-21 DIAGNOSIS — R0609 Other forms of dyspnea: Secondary | ICD-10-CM

## 2020-06-21 DIAGNOSIS — G5603 Carpal tunnel syndrome, bilateral upper limbs: Secondary | ICD-10-CM | POA: Diagnosis not present

## 2020-06-21 LAB — BASIC METABOLIC PANEL
Anion gap: 10 (ref 5–15)
BUN: 22 mg/dL (ref 8–23)
CO2: 27 mmol/L (ref 22–32)
Calcium: 9.5 mg/dL (ref 8.9–10.3)
Chloride: 102 mmol/L (ref 98–111)
Creatinine, Ser: 1.58 mg/dL — ABNORMAL HIGH (ref 0.44–1.00)
GFR, Estimated: 37 mL/min — ABNORMAL LOW (ref >60)
Glucose, Bld: 101 mg/dL — ABNORMAL HIGH (ref 70–99)
Potassium: 3.9 mmol/L (ref 3.5–5.1)
Sodium: 139 mmol/L (ref 135–145)

## 2020-06-21 LAB — BRAIN NATRIURETIC PEPTIDE: B Natriuretic Peptide: 145.7 pg/mL — ABNORMAL HIGH (ref 0.0–100.0)

## 2020-06-21 MED ORDER — FUROSEMIDE 40 MG PO TABS
60.0000 mg | ORAL_TABLET | Freq: Two times a day (BID) | ORAL | 5 refills | Status: DC
Start: 2020-06-21 — End: 2020-07-06

## 2020-06-21 NOTE — Progress Notes (Signed)
Date:  06/21/2020   PCP:  Windell Hummingbird, PA-C  HF Cardiologist:  Dr. Aundra Dubin   History of Present Illness: Kimberly [name] is a 65 y.o. female who has a history of paroxysmal atrial fibrillation, obesity, and nonischemic cardiomyopathy.  She was admitted in 3/15 at [facility] with atrial fibrillation and CHF.  TEE was done in preparation for DCCV, showing EF 25-30% but there was LA thrombus so DCCV was not done.  Plan was for anticoagulation x 3 months followed by TEE-guided DCCV in the OR (due to patient's body habitus).  LHC was also done, showing mild nonobstructive CAD.  Patient was diuresed with IV Lasix in the hospital and started on apixaban.   Patient had TEE again in 6/15, showing EF 35-40% with diffuse hypokinesis and no LAA thrombus.  She was cardioverted to NSR (with difficulty).  I started her at that time on amiodarone.  She then went back into atrial fibrillation.  After she had been loaded on amiodarone, cardioverted her again in 7/15 to NSR.    She had gone back into atrial fibrillation in 1/18 and amiodarone was increased to bid in preparation for DCCV. However, she spontaneously converted to NSR.  PYP scan in 4/19 was grade 3, strongly suggestive of TTR amyloidosis.  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  Echo 8/19 showed EF 60%, moderate LVH, mild aortic stenosis.  Of note, she has carpal tunnel syndrome.  She was started on tafamidis.   She went back into atrial fibrillation and had DCCV in 2/20 but was back in atrial fibrillation a few weeks later when she went to atrial fibrillation clinic.  Dr. Rayann Heman thought that morbid obesity precluded atrial fibrillation ablation and concerns about compliance led them not to start her on Tikosyn.  Instead, it was recommended that she start on CPAP for her OSA, lose weight, and re-attempt DCCV while on amiodarone.   Unfortunately, she had been lost to f/u in the [facility] for ~1.5 years.  She was still  taking amiodarone 200 mg bid but has remained in persistent atrial fibrillation.  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  In 7/21, she developed hematochezia and had EGD/colonoscopy which were unrevealing except for internal hemorrhoids.   Recently seen in clinic by Dr. Aundra Dubin last month and was noted to be fluid overloaded. He increased Lasix to 60 qam/40 qpm. Given elevated SCr at 2, he stopped lisinopril and added amlodipine for BP. She had repeat labs 1 week after med change and SCr has improved, down to 1.3.  She returned back to clinic 11/21 for repeat assessment of volume status. She remained fluid overloaded. She increased her lasix as instructed but stopped taking her spiro. Spiro 25 mg restarted, and lasix 60 mg qam/40 mg q pm continued. Has not taken lasix in 7 days.  Today she returns for HF follow up. Overall feeling fine. SOB with exertion. Denies PND. +Orthopnea. . Having ongoing leg edema. Appetite ok.  Snacks on chips and popcorn. She has been eating lots of Chicken Noodle Soup/ drinking lots of fluid.  No fever or chills. She has not been weighing at home. Taking all medications.    Labs (4/19): LFTs normal, K 4, creatinine 1.24, TSH normal, immunofixation normal Labs (8/19): TSH normal, K 3.8, creatinine 1.27, LFTs normal, TSH normal Labs (11/19): K 3.8, creatinine 1.21 Labs (2/20): LDL 114, hgb  10.5 Labs (3/20): K 4.4, creatinine 1.42 Labs (4/20): K 4.2, creatinine 1.23 Labs (7/21): K 3.8, creatinine 1.23 Labs (8/21): hgb 11.2 Labs (10/21): creatinine 2.1 Labs (10/21): creatinine 1.36, K 4.3   Labs (11/21): K 4.2, creatinine 1.46 Labs (05/31/20): K 5.1 Creatinine 1.7  PMH: 1. Obese 2. Atrial fibrillation: Paroxysmal => chronic.  Unable to cardiovert in 3/15 due to LAA thrombus noted on TEE. TEE in 6/15 without LAA thrombus.  Patient had cardioversion to NSR with difficulty but back in  atrial fibrillation by 7/15 appt.  She was started on amiodarone and cardioverted in 7/15.   - DCCV in 2/20 but back in atrial fibrillation by 3/20, has been in atrial fibrillation since that time.  3. H/o TKR 4. Nonischemic cardiomyopathy: TEE (3/15) with EF 25-30%, moderate MR, PA systolic pressure 51 mmHg, moderate to severe TR, LA thrombus was noted.   LHC (3/15) with 50% stenosis small PLV.  TEE (6/15) with EF 35-40%, diffuse hypokinesis, mild MR, moderate TR, +PFO, no LA appendage thrombus.  - Echo (10/15) with EF 60-65%.   - ECHO 06/10/2016 EF 60-65% Mild AS - Echo (8/19): EF 60%, moderate LVH, mild aortic stenosis.  - Echo (11/21): EF 55-60%, mild/mod AS - PYP scan in 4/19 was grade 3, strongly suggestive of transthyretin amyloidosis.  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  5. CAD: Nonobstructive.  LHC (3/15) with 50% stenosis small PLV. 6. PFO 7. Left Breast mass: Was taken for lumpectomy in 1/18 but mass had resolved.  8. Aortic stenosis: Mild.  9. Carpal tunnel syndrome.  10. Vestibular schwannoma s/p gamma knife surgery at [facility].  11. OSA: Not currently using CPAP.  12. Hematochezia: C-scope/EGD in 7/21 showed only internal hemorrhoids.  13. 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    Current Outpatient Medications  Medication Sig Dispense Refill  . acetaminophen (TYLENOL) 650 MG CR tablet Take 1,300 mg by mouth every 8 (eight) hours as needed for pain.     [name] apixaban (ELIQUIS) 5 MG TABS tablet TAKE 1 TABLET (5 MG TOTAL) BY MOUTH 2 (TWO) TIMES DAILY. 60 tablet 5  . Cholecalciferol (VITAMIN D-3) 5000 UNITS TABS Take 5,000 Units by mouth daily.    . Ferrous Sulfate 27 MG TABS Take 27 mg by mouth 2 (two) times daily.     . furosemide (LASIX) 40 MG tablet Take 1.5 tablets (60 mg total) by mouth every morning AND 1 tablet (40 mg total) every evening. 75 tablet 5  . metoprolol succinate (TOPROL-XL) 100 MG 24 hr tablet Take 1 tablet (100 mg total) by mouth 2 (two) times  daily. 180 tablet 0  . nitroGLYCERIN (NITROSTAT) 0.4 MG SL tablet Place 1 tablet (0.4 mg total) under the tongue every 5 (five) minutes as needed for chest pain. 30 tablet 1  . Omega-3 1000 MG CAPS Take by mouth.    . RESTASIS 0.05 % ophthalmic emulsion     . rosuvastatin (CRESTOR) 40 MG tablet Take 1 tablet (40 mg total) by mouth at bedtime. 90 tablet 3   No current facility-administered medications for this encounter.    Allergies:   Tramadol   Social History:  The patient  reports that she has never smoked. She has never used smokeless tobacco. She reports that she does not drink alcohol and does not use drugs.   Family History:  The patient's family history includes CVA in her mother.   ROS:  Please see the history of present illness.   All other systems  are personally reviewed and negative.   Vitals:   BP (!) 124/96   Pulse (!) 109   Wt (!) 176.4 kg (389 lb)   SpO2 98%   BMI 62.79 kg/m  PHYSICAL EXAM: General:  Well appearing. No resp difficulty HEENT: normal Neck: supple. JVP 11-12 . Carotids 2+ bilat; no bruits. No lymphadenopathy or thryomegaly appreciated. Cor: PMI nondisplaced. Irregular rate & rhythm. No rubs, gallops or murmurs. Lungs: clear Abdomen: soft, nontender, nondistended. No hepatosplenomegaly. No bruits or masses. Good bowel sounds. Extremities: no cyanosis, clubbing, rash, R and LLE 1+ edema Neuro: alert & orientedx3, cranial nerves grossly intact. moves all 4 extremities w/o difficulty. Affect pleasant  EKG: A fib 98 bpm personally reviewed.   Recent Labs: 03/01/2020: ALT 14; TSH 1.404 05/31/2020: BUN 23; Creatinine, Ser 1.72; Hemoglobin 12.0; Platelets 152; Potassium 5.1; Sodium 137  Personally reviewed   Wt Readings from Last 3 Encounters:  06/21/20 (!) 176.4 kg (389 lb)  05/31/20 (!) 169.9 kg (374 lb 9.6 oz)  03/27/20 (!) 175.5 kg (387 lb)     ASSESSMENT AND PLAN: 1. 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  EF 35-40% on  TEE in 6/15 but then EF back to 60-65% by 10/15 and remained normal on last echo in 8/19.  LHC without significant coronary disease in 3/15.  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 Echo 11/21 EF 55-60% mild/mod AS.  NYHA class III symptoms.   -Volume status elevated suspect in the setting of high sodium diet and increased fluid intake.  -Increase lasix to 60 mg twice a day.  --Off Spiro due itching.  - Continue Toprol XL 100 mg bid.  - no longer on ACEi due to elevated SCr (rose to 2.0, improved down to 1.3 after discontinuation).  - discussed low salt food choices and limiting fluid intake to < 2 liters per day.   2. 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  Atrial fibrillation and carpal tunnel syndrome, both of which she has, go along with amyloidosis.  Her history of orthostatic symptoms as well as symptoms in toes and feet consistent with peripheral neuropathy are also likely related to amyloidosis.  - Continue tafamidis.   - Given hereditary TTR amyloidosis. We will see if I can get her started on patisiran as well to help with her peripheral neuropathy.  - She has been refer to Dr. Broadus John for genetic counseling.   3. Atrial fibrillation: Last DCCV in 2/20 but back in atrial fibrillation since then.  Dr. Rayann Heman saw, decided against atrial fibrillation ablation due to morbid obesity.  EP decided against Tikosyn due to compliance concerns. Recommendation was to continue amiodarone, start CPAP, and retry DCCV while compliant with CPAP.  However, she is not using CPAP due to her tinnitus. Her afib is now permanent. Amiodarone was discontinued 10/21.  - Rate controlled.  - Continue Toprol XL 100 mg bid.  - Continue Eliquis. 4. CAD: Mild, nonobstructive by  cath 07/2013. Stable w/o CP.   - No ASA given Eliquis use.   - Continue crestor 40 mg daily.  5. Bilateral carpal tunnel symptoms: Likely related to amyloidosis.  6. OSA: She says that she cannot tolerate CPAP along with her tinnitus.    Follow up in 1-2 weeks to reassess volume status. Add spironolactone to allergy list.  Greater than  50% of the (total minutes 25) visit spent in counseling/coordination of care regarding the above.     Jeanmarie Hubert, NP  06/21/2020  [place] [address] [facility] [place] [address] [phone] (office) [phone] (fax)

## 2020-06-21 NOTE — Patient Instructions (Signed)
Remain off Spironolactone for now INCREASE Lasix to 60 mg (one and one half tab ) twice a day  Labs today We will only contact you if something comes back abnormal or we need to make some changes. Otherwise no news is good news!  Your physician recommends that you schedule a follow-up appointment in: 1-2 weeks  in the Advanced Practitioners (PA/NP) Clinic    Do the following things EVERYDAY: 1) Weigh yourself in the morning before breakfast. Write it down and keep it in a log. 2) Take your medicines as prescribed 3) Eat low salt foods-Limit salt (sodium) to 2000 mg per day.  4) Stay as active as you can everyday 5) Limit all fluids for the day to less than 2 liters  At the [facility], you and your health needs are our priority. As part of our continuing mission to provide you with exceptional heart care, we have created designated Provider Care Teams. These Care Teams include your primary Cardiologist (physician) and Advanced Practice Providers (APPs- Physician Assistants and Nurse Practitioners) who all work together to provide you with the care you need, when you need it.   You may see any of the following providers on your designated Care Team at your next follow up: [name] Dr Glori Bickers . Dr [name] . Darrick Grinder, NP . Lyda Jester, [place] . Audry Riles, PharmD   Please be sure to bring in all your medications bottles to every appointment.     If you have any questions or concerns before your next appointment please send [place] a message through [place] or call our office at [phone].    TO LEAVE A MESSAGE FOR THE NURSE SELECT OPTION 2, PLEASE LEAVE A MESSAGE INCLUDING: . YOUR NAME . DATE OF BIRTH . CALL BACK NUMBER . REASON FOR CALL**this is important as we prioritize the call backs  YOU WILL RECEIVE A CALL BACK THE SAME DAY AS LONG AS YOU CALL BEFORE 4:00 PM

## 2020-07-05 NOTE — Progress Notes (Addendum)
Advanced Heart Failure Clinic Note  Date:  07/06/2020   PCP:  Windell Hummingbird, PA-C  HF Cardiologist:  Dr. Aundra Dubin   History of Present Illness: Kimberly [name] is a 65 y.o. female who has a history of paroxysmal atrial fibrillation, obesity, and nonischemic cardiomyopathy.  She was admitted in 3/15 at [facility] with atrial fibrillation and CHF.  TEE was done in preparation for DCCV, showing EF 25-30% but there was LA thrombus so DCCV was not done.  Plan was for anticoagulation x 3 months followed by TEE-guided DCCV in the OR (due to patient's body habitus).  LHC was also done, showing mild nonobstructive CAD.  Patient was diuresed with IV Lasix in the hospital and started on apixaban.   Patient had TEE again in 6/15, showing EF 35-40% with diffuse hypokinesis and no LAA thrombus.  She was cardioverted to NSR (with difficulty).  I started her at that time on amiodarone.  She then went back into atrial fibrillation.  After she had been loaded on amiodarone, cardioverted her again in 7/15 to NSR.    She had gone back into atrial fibrillation in 1/18 and amiodarone was increased to bid in preparation for DCCV. However, she spontaneously converted to NSR.  PYP scan in 4/19 was grade 3, strongly suggestive of TTR amyloidosis.  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  Echo 8/19 showed EF 60%, moderate LVH, mild aortic stenosis.  Of note, she has carpal tunnel syndrome.  She was started on tafamidis.   She went back into atrial fibrillation and had DCCV in 2/20 but was back in atrial fibrillation a few weeks later when she went to atrial fibrillation clinic.  Dr. Rayann Heman thought that morbid obesity precluded atrial fibrillation ablation and concerns about compliance led them not to start her on Tikosyn.  Instead, it was recommended that she start on CPAP for her OSA, lose weight, and re-attempt DCCV while on amiodarone.   Unfortunately, she had been lost to f/u in the  [facility] for ~1.5 years.  She was still taking amiodarone 200 mg bid but has remained in persistent atrial fibrillation.  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  In 7/21, she developed hematochezia and had EGD/colonoscopy which were unrevealing except for internal hemorrhoids.   Recently seen in clinic was noted to be fluid overloaded. Increased Lasix to 60 qam/40 qpm. Given elevated SCr at 2, stopped lisinopril and added amlodipine for BP. Repeat SCr improved, down to 1.3.  She returned back to clinic 11/21 and she remained fluid overloaded. She increased her lasix as instructed but stopped taking her spiro. Spiro 25 mg restarted, and lasix 60 mg qam/40 mg q pm continued. Had not taken lasix in 7 days.   She returned 1/22 for HF follow up. Has been out of her lasix for the past 7 days. Weight down 13 lbs. Lasix 60/40 restarted and Jardiance started.  Today she returns for HF follow up. She increased her lasix to 80 mg daily herself after 60/40 was not working, but still having swelling. Breathing is worse past 2 weeks. Denies increasing CP, dizziness, or PND/Orthopnea. Appetite ok. No fever or chills. Weight at home ~380-386 pounds. Taking all medications. Says she is watching her salt and fluid intake.   Labs (4/19): LFTs normal, K 4, creatinine 1.24, TSH normal, immunofixation normal Labs (8/19): TSH normal, K 3.8, creatinine 1.27,  LFTs normal, TSH normal Labs (11/19): K 3.8, creatinine 1.21 Labs (2/20): LDL 114, hgb 10.5 Labs (3/20): K 4.4, creatinine 1.42 Labs (4/20): K 4.2, creatinine 1.23 Labs (7/21): K 3.8, creatinine 1.23 Labs (8/21): hgb 11.2 Labs (10/21): creatinine 2.1 Labs (10/21): creatinine 1.36, K 4.3   Labs (11/21): K 4.2, creatinine 1.46 Labs (2/22): K 3.9, creatinine 1.58  PMH: 1. Obese 2. Atrial fibrillation: Paroxysmal => chronic.  Unable to cardiovert in 3/15 due to LAA thrombus noted  on TEE. TEE in 6/15 without LAA thrombus.  Patient had cardioversion to NSR with difficulty but back in atrial fibrillation by 7/15 appt.  She was started on amiodarone and cardioverted in 7/15.   - DCCV in 2/20 but back in atrial fibrillation by 3/20, has been in atrial fibrillation since that time.  3. H/o TKR 4. Nonischemic cardiomyopathy: TEE (3/15) with EF 25-30%, moderate MR, PA systolic pressure 51 mmHg, moderate to severe TR, LA thrombus was noted.   LHC (3/15) with 50% stenosis small PLV.  TEE (6/15) with EF 35-40%, diffuse hypokinesis, mild MR, moderate TR, +PFO, no LA appendage thrombus.  - Echo (10/15) with EF 60-65%.   - ECHO 06/10/2016 EF 60-65% Mild AS - Echo (8/19): EF 60%, moderate LVH, mild aortic stenosis.  - Echo (11/21): EF 55-60%, mild/mod AS - PYP scan in 4/19 was grade 3, strongly suggestive of transthyretin amyloidosis.  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  5. CAD: Nonobstructive.  LHC (3/15) with 50% stenosis small PLV. 6. PFO 7. Left Breast mass: Was taken for lumpectomy in 1/18 but mass had resolved.  8. Aortic stenosis: Mild.  9. Carpal tunnel syndrome.  10. Vestibular schwannoma s/p gamma knife surgery at [facility].  11. OSA: Not currently using CPAP.  12. Hematochezia: C-scope/EGD in 7/21 showed only internal hemorrhoids.  13. 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    Current Outpatient Medications  Medication Sig Dispense Refill  . acetaminophen (TYLENOL) 650 MG CR tablet Take 1,300 mg by mouth every 8 (eight) hours as needed for pain.     [name] apixaban (ELIQUIS) 5 MG TABS tablet TAKE 1 TABLET (5 MG TOTAL) BY MOUTH 2 (TWO) TIMES DAILY. 60 tablet 5  . Cholecalciferol (VITAMIN D-3) 5000 UNITS TABS Take 5,000 Units by mouth daily.    . empagliflozin (JARDIANCE) 10 MG TABS tablet Take 1 tablet (10 mg total) by mouth daily before breakfast. 90 tablet 3  . Ferrous Sulfate 27 MG TABS Take 27 mg by mouth 2 (two) times daily.     . furosemide (LASIX) 40 MG  tablet Take 80 mg by mouth daily.    . metoprolol succinate (TOPROL-XL) 100 MG 24 hr tablet Take 1 tablet (100 mg total) by mouth 2 (two) times daily. 180 tablet 0  . nitroGLYCERIN (NITROSTAT) 0.4 MG SL tablet Place 1 tablet (0.4 mg total) under the tongue every 5 (five) minutes as needed for chest pain. 30 tablet 1  . Omega-3 1000 MG CAPS Take by mouth.    . RESTASIS 0.05 % ophthalmic emulsion     . rosuvastatin (CRESTOR) 40 MG tablet Take 1 tablet (40 mg total) by mouth at bedtime. 90 tablet 3   Current Facility-Administered Medications  Medication Dose Route Frequency Provider Last Rate Last Admin  . furosemide (LASIX) injection 80 mg  80 mg Intravenous Once [place], Maricela Bo, FNP      . potassium chloride SA (KLOR-CON) CR tablet 40 mEq  40 mEq Oral Once [place], Maricela Bo, FNP  Allergies:   Tramadol and Spironolactone   Social History:  The patient  reports that she has never smoked. She has never used smokeless tobacco. She reports that she does not drink alcohol and does not use drugs.   Family History:  The patient's family history includes CVA in her mother.   ROS:  Please see the history of present illness.   All other systems are personally reviewed and negative.   Vitals:   BP 110/60   Wt (!) 177.9 kg (392 lb 3.2 oz)   SpO2 95%   BMI 63.30 kg/m  PHYSICAL EXAM: General:  NAD. No resp difficulty HEENT: Normal Neck: Supple. JVP to jaw. Carotids 2+ bilat; no bruits. No lymphadenopathy or thryomegaly appreciated. Cor: PMI nondisplaced. Irregular rate & rhythm. No rubs, gallops, II/VI SEM RUSB Lungs: Clear, diminished in bases Abdomen: Morbidly obese, soft, nontender, nondistended. No hepatosplenomegaly. No bruits or masses. Good bowel sounds. Extremities: No cyanosis, clubbing, rash, 3+ pretibial pitting edema Neuro: alert & oriented x 3, cranial nerves grossly intact. Moves all 4 extremities w/o difficulty. Affect pleasant.  Recent Labs: 03/01/2020: ALT 14; TSH  1.404 05/31/2020: Hemoglobin 12.0; Platelets 152 06/21/2020: B Natriuretic Peptide 145.7; BUN 22; Creatinine, Ser 1.58; Potassium 3.9; Sodium 139  Personally reviewed   Wt Readings from Last 3 Encounters:  07/06/20 (!) 177.9 kg (392 lb 3.2 oz)  06/21/20 (!) 176.4 kg (389 lb)  05/31/20 (!) 169.9 kg (374 lb 9.6 oz)     ASSESSMENT AND PLAN: 1. 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  EF 35-40% on TEE in 6/15 but then EF back to 60-65% by 10/15 and remained normal on last echo in 8/19.  LHC without significant coronary disease in 3/15.  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 Echo 11/21 EF 55-60% mild/mod AS. She remains fluid overloaded on exam. NYHA class IIIb symptoms.  She is not interested in At Home.  - Will give lasix 80 mg IV x 1 now + 40 mEq of KCl - Tomorrow will start lasix mg daily. If unable to control volume with increasing doses of lasix, may need to consider switching to torsemide. - Start Jardiance 10 mg daily. A1C 6.0 (11/21). - Continue Toprol XL 100 mg bid. - Off Spiro due to itching. - Check BMET today and again next week.  - no longer on ACEi due to elevated SCr (rose to 2.0, improved down to 1.3 after discontinuation).  2. 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  Atrial fibrillation and carpal tunnel syndrome, both of which she has, go along with amyloidosis.  Her history of orthostatic symptoms as well as symptoms in toes and feet consistent with peripheral neuropathy are also likely related to amyloidosis.  - Continue tafamidis.   - Given hereditary TTR amyloidosis. We will see if I can get her started on patisiran as well to help with her peripheral neuropathy.  - She has been refer to Dr. Broadus John for  genetic counseling.   3. Atrial fibrillation: Last DCCV in 2/20 but back in atrial fibrillation since then.  Dr. Rayann Heman saw, decided against atrial fibrillation ablation due to morbid obesity.  EP decided against Tikosyn due to compliance concerns. Recommendation was to continue amiodarone, start CPAP, and retry DCCV while compliant with CPAP.  However, she is not using CPAP due to  her tinnitus. Her afib is now permanent. Amiodarone was discontinued 10/21. She is well rate controlled w/  blocker. - Continue Toprol XL 100 mg bid.  - Continue Eliquis. Hgb 12 (1/22).  4. CAD: Mild, nonobstructive by cath 07/2013. Stable w/o CP.   - No ASA given Eliquis use.   - Continue crestor 40 mg daily.  5. Bilateral carpal tunnel symptoms: Likely related to amyloidosis.  6. OSA: She says that she cannot tolerate CPAP along with her tinnitus.  7. Morbid obesity: Body mass index is 63.3 kg/m. - Encouraged portion control and increasing physical activity as tolerated. - Stressed importance of limiting fluids and high-salt foods.  F/u in APP early next week in APP clinic to reassess volume.  Signed, Rafael Bihari, FNP-BC 07/06/2020  [place] [address] [facility] [place] [address] [phone] (office) [phone] (fax)

## 2020-07-06 ENCOUNTER — Encounter (HOSPITAL_COMMUNITY): Payer: Self-pay

## 2020-07-06 ENCOUNTER — Ambulatory Visit (HOSPITAL_COMMUNITY)
Admission: RE | Admit: 2020-07-06 | Discharge: 2020-07-06 | Disposition: A | Discharge status: Home / Self Care | Payer: Medicare HMO | Source: Ambulatory Visit | Attending: Family Medicine | Admitting: Family Medicine

## 2020-07-06 ENCOUNTER — Other Ambulatory Visit: Payer: Self-pay

## 2020-07-06 VITALS — BP 110/60 | Wt 392.2 lb

## 2020-07-06 DIAGNOSIS — Z79899 Other long term (current) drug therapy: Secondary | ICD-10-CM | POA: Diagnosis not present

## 2020-07-06 DIAGNOSIS — I5022 Chronic systolic (congestive) heart failure: Secondary | ICD-10-CM

## 2020-07-06 DIAGNOSIS — I2510 Atherosclerotic heart disease of native coronary artery without angina pectoris: Secondary | ICD-10-CM | POA: Insufficient documentation

## 2020-07-06 DIAGNOSIS — I5042 Chronic combined systolic (congestive) and diastolic (congestive) heart failure: Secondary | ICD-10-CM | POA: Insufficient documentation

## 2020-07-06 DIAGNOSIS — I4891 Unspecified atrial fibrillation: Secondary | ICD-10-CM

## 2020-07-06 DIAGNOSIS — G4733 Obstructive sleep apnea (adult) (pediatric): Secondary | ICD-10-CM | POA: Diagnosis not present

## 2020-07-06 DIAGNOSIS — G5603 Carpal tunnel syndrome, bilateral upper limbs: Secondary | ICD-10-CM

## 2020-07-06 DIAGNOSIS — E854 Organ-limited amyloidosis: Secondary | ICD-10-CM | POA: Diagnosis not present

## 2020-07-06 DIAGNOSIS — I43 Cardiomyopathy in diseases classified elsewhere: Secondary | ICD-10-CM | POA: Insufficient documentation

## 2020-07-06 DIAGNOSIS — Z7901 Long term (current) use of anticoagulants: Secondary | ICD-10-CM | POA: Insufficient documentation

## 2020-07-06 DIAGNOSIS — Z6841 Body Mass Index (BMI) 40.0 and over, adult: Secondary | ICD-10-CM | POA: Diagnosis not present

## 2020-07-06 DIAGNOSIS — H9319 Tinnitus, unspecified ear: Secondary | ICD-10-CM | POA: Diagnosis not present

## 2020-07-06 DIAGNOSIS — I428 Other cardiomyopathies: Secondary | ICD-10-CM | POA: Insufficient documentation

## 2020-07-06 DIAGNOSIS — G5600 Carpal tunnel syndrome, unspecified upper limb: Secondary | ICD-10-CM | POA: Insufficient documentation

## 2020-07-06 DIAGNOSIS — I480 Paroxysmal atrial fibrillation: Secondary | ICD-10-CM | POA: Insufficient documentation

## 2020-07-06 LAB — BASIC METABOLIC PANEL
Anion gap: 10 (ref 5–15)
BUN: 24 mg/dL — ABNORMAL HIGH (ref 8–23)
CO2: 28 mmol/L (ref 22–32)
Calcium: 8.8 mg/dL — ABNORMAL LOW (ref 8.9–10.3)
Chloride: 100 mmol/L (ref 98–111)
Creatinine, Ser: 1.78 mg/dL — ABNORMAL HIGH (ref 0.44–1.00)
GFR, Estimated: 32 mL/min — ABNORMAL LOW (ref >60)
Glucose, Bld: 101 mg/dL — ABNORMAL HIGH (ref 70–99)
Potassium: 4.6 mmol/L (ref 3.5–5.1)
Sodium: 138 mmol/L (ref 135–145)

## 2020-07-06 LAB — BRAIN NATRIURETIC PEPTIDE: B Natriuretic Peptide: 96.3 pg/mL (ref 0.0–100.0)

## 2020-07-06 MED ORDER — FUROSEMIDE 10 MG/ML IJ SOLN
80.0000 mg | Freq: Once | INTRAMUSCULAR | Status: AC
  Administered 2020-07-06: 80 mg via INTRAVENOUS

## 2020-07-06 MED ORDER — POTASSIUM CHLORIDE CRYS ER 20 MEQ PO TBCR
40.0000 meq | EXTENDED_RELEASE_TABLET | Freq: Once | ORAL | Status: AC
  Administered 2020-07-06: 40 meq via ORAL

## 2020-07-06 MED ORDER — EMPAGLIFLOZIN 10 MG PO TABS: 10.0000 mg | ORAL_TABLET | Freq: Every day | ORAL | 3 refills | Status: DC

## 2020-07-06 NOTE — Patient Instructions (Addendum)
START Jardiance 10mg  (1 tablet) daily  START Lasix 80mg  daily STARTING TOMORROW  Labs done today, your results will be available in MyChart, we will contact you for abnormal readings.  Your physician recommends that you schedule repeat labs in 7-10 days  Your physician recommends that you schedule a follow-up appointment next Tuesday  If you have any questions or concerns before your next appointment please send [place] a message through [place] or call our office at [phone].    TO LEAVE A MESSAGE FOR THE NURSE SELECT OPTION 2, PLEASE LEAVE A MESSAGE INCLUDING: . YOUR NAME . DATE OF BIRTH . CALL BACK NUMBER . REASON FOR CALL**this is important as we prioritize the call backs  YOU WILL RECEIVE A CALL BACK THE SAME DAY AS LONG AS YOU CALL BEFORE 4:00 PM

## 2020-07-06 NOTE — Addendum Note (Signed)
Encounter addended by: Malena Edman, RN on: 07/06/2020 3:57 PM  Actions taken: MAR administration accepted, LDA properties accepted

## 2020-07-06 NOTE — Addendum Note (Signed)
Encounter addended by: Rafael Bihari, FNP on: 07/06/2020 5:09 PM  Actions taken: Clinical Note Signed

## 2020-07-10 ENCOUNTER — Encounter (HOSPITAL_COMMUNITY): Payer: Medicare HMO

## 2020-07-10 ENCOUNTER — Other Ambulatory Visit: Payer: Self-pay

## 2020-07-10 ENCOUNTER — Encounter (HOSPITAL_COMMUNITY): Payer: Self-pay

## 2020-07-10 ENCOUNTER — Ambulatory Visit (HOSPITAL_COMMUNITY)
Admission: RE | Admit: 2020-07-10 | Discharge: 2020-07-10 | Disposition: A | Discharge status: Home / Self Care | Payer: Medicare HMO | Source: Ambulatory Visit | Attending: Adult Health | Admitting: Adult Health

## 2020-07-10 VITALS — BP 130/80 | HR 94 | Wt 386.6 lb

## 2020-07-10 DIAGNOSIS — G4733 Obstructive sleep apnea (adult) (pediatric): Secondary | ICD-10-CM | POA: Diagnosis not present

## 2020-07-10 DIAGNOSIS — H9319 Tinnitus, unspecified ear: Secondary | ICD-10-CM | POA: Insufficient documentation

## 2020-07-10 DIAGNOSIS — Z7984 Long term (current) use of oral hypoglycemic drugs: Secondary | ICD-10-CM | POA: Insufficient documentation

## 2020-07-10 DIAGNOSIS — I43 Cardiomyopathy in diseases classified elsewhere: Secondary | ICD-10-CM | POA: Diagnosis not present

## 2020-07-10 DIAGNOSIS — R682 Dry mouth, unspecified: Secondary | ICD-10-CM | POA: Diagnosis not present

## 2020-07-10 DIAGNOSIS — R0602 Shortness of breath: Secondary | ICD-10-CM | POA: Insufficient documentation

## 2020-07-10 DIAGNOSIS — Z79899 Other long term (current) drug therapy: Secondary | ICD-10-CM | POA: Insufficient documentation

## 2020-07-10 DIAGNOSIS — I428 Other cardiomyopathies: Secondary | ICD-10-CM | POA: Insufficient documentation

## 2020-07-10 DIAGNOSIS — I5022 Chronic systolic (congestive) heart failure: Secondary | ICD-10-CM | POA: Diagnosis not present

## 2020-07-10 DIAGNOSIS — E1142 Type 2 diabetes mellitus with diabetic polyneuropathy: Secondary | ICD-10-CM | POA: Diagnosis not present

## 2020-07-10 DIAGNOSIS — Z6841 Body Mass Index (BMI) 40.0 and over, adult: Secondary | ICD-10-CM | POA: Diagnosis not present

## 2020-07-10 DIAGNOSIS — I480 Paroxysmal atrial fibrillation: Secondary | ICD-10-CM | POA: Insufficient documentation

## 2020-07-10 DIAGNOSIS — Z7901 Long term (current) use of anticoagulants: Secondary | ICD-10-CM | POA: Insufficient documentation

## 2020-07-10 DIAGNOSIS — G5603 Carpal tunnel syndrome, bilateral upper limbs: Secondary | ICD-10-CM | POA: Diagnosis not present

## 2020-07-10 DIAGNOSIS — I5042 Chronic combined systolic (congestive) and diastolic (congestive) heart failure: Secondary | ICD-10-CM | POA: Insufficient documentation

## 2020-07-10 DIAGNOSIS — R0609 Other forms of dyspnea: Secondary | ICD-10-CM

## 2020-07-10 DIAGNOSIS — I2510 Atherosclerotic heart disease of native coronary artery without angina pectoris: Secondary | ICD-10-CM | POA: Diagnosis not present

## 2020-07-10 DIAGNOSIS — R0600 Dyspnea, unspecified: Secondary | ICD-10-CM

## 2020-07-10 DIAGNOSIS — E854 Organ-limited amyloidosis: Secondary | ICD-10-CM | POA: Diagnosis not present

## 2020-07-10 LAB — BASIC METABOLIC PANEL
Anion gap: 10 (ref 5–15)
BUN: 17 mg/dL (ref 8–23)
CO2: 29 mmol/L (ref 22–32)
Calcium: 9.2 mg/dL (ref 8.9–10.3)
Chloride: 104 mmol/L (ref 98–111)
Creatinine, Ser: 1.38 mg/dL — ABNORMAL HIGH (ref 0.44–1.00)
GFR, Estimated: 43 mL/min — ABNORMAL LOW (ref >60)
Glucose, Bld: 93 mg/dL (ref 70–99)
Potassium: 3.2 mmol/L — ABNORMAL LOW (ref 3.5–5.1)
Sodium: 143 mmol/L (ref 135–145)

## 2020-07-10 MED ORDER — TORSEMIDE 20 MG PO TABS: 60.0000 mg | ORAL_TABLET | Freq: Every day | ORAL | 3 refills | Status: DC

## 2020-07-10 NOTE — Patient Instructions (Signed)
STOP Lasix  START Torsemide 60mg  (3 tablets) daily  Labs done today, your results will be available in MyChart, we will contact you for abnormal readings.  Your physician recommends that you schedule a follow-up appointment in: 1 week  If you have any questions or concerns before your next appointment please send [place] a message through [place] or call our office at [phone].    TO LEAVE A MESSAGE FOR THE NURSE SELECT OPTION 2, PLEASE LEAVE A MESSAGE INCLUDING: . YOUR NAME . DATE OF BIRTH . CALL BACK NUMBER . REASON FOR CALL**this is important as we prioritize the call backs  YOU WILL RECEIVE A CALL BACK THE SAME DAY AS LONG AS YOU CALL BEFORE 4:00 PM

## 2020-07-10 NOTE — Progress Notes (Signed)
Advanced [facility] Failure Clinic Note  Date:  07/10/2020   PCP:  Windell Hummingbird, PA-C  HF Cardiologist:  Dr. Aundra Dubin   History of Present Illness: Kimberly [name] is a 65 y.o. female who has a history of paroxysmal atrial fibrillation, obesity, and nonischemic cardiomyopathy.  She was admitted in 3/15 at [facility] with atrial fibrillation and CHF.  TEE was done in preparation for DCCV, showing EF 25-30% but there was LA thrombus so DCCV was not done.  Plan was for anticoagulation x 3 months followed by TEE-guided DCCV in the OR (due to patient's body habitus).  LHC was also done, showing mild nonobstructive CAD.  Patient was diuresed with IV Lasix in the hospital and started on apixaban.   Patient had TEE again in 6/15, showing EF 35-40% with diffuse hypokinesis and no LAA thrombus.  She was cardioverted to NSR (with difficulty). Went back in A fib so had another DC-CV  To NSR.   She had gone back into atrial fibrillation in 1/18 and amiodarone was increased to bid in preparation for DCCV. However, she spontaneously converted to NSR.  PYP scan in 4/19 was grade 3, strongly suggestive of TTR amyloidosis.  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  Echo 8/19 showed EF 60%, moderate LVH, mild aortic stenosis.  Of note, she has carpal tunnel syndrome.  She was started on tafamidis.   She went back into atrial fibrillation and had DCCV in 2/20 but was back in atrial fibrillation a few weeks later when she went to atrial fibrillation clinic.  Dr. Rayann Heman thought that morbid obesity precluded atrial fibrillation ablation and concerns about compliance led them not to start her on Tikosyn.  Instead, it was recommended that she start on CPAP for her OSA, lose weight, and re-attempt DCCV while on amiodarone.   Unfortunately, she had been lost to f/u in the [facility] for ~1.5 years.  She was still taking amiodarone 200 mg bid but has remained in persistent atrial fibrillation.  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  In 7/21, she developed hematochezia and had EGD/colonoscopy which were unrevealing except for internal hemorrhoids.   She returned back to clinic 11/21 and she remained fluid overloaded. She increased her lasix as instructed but stopped taking her spiro. Spiro 25 mg restarted, and lasix 60 mg qam/40 mg q pm continued. Had not taken lasix in 7 days.   She returned 1/22 for HF follow up. Has been out of her lasix for the past 7 days. Weight down 13 lbs. Lasix 60/40 restarted and Jardiance started.  Returned to clinic 07/06/20 and was volume overloaded. Given IV lasix in the clinic. She refused AT HOME.   Today she returns for HF follow up.Overall feeling fine. Complaining of dry mouth. Remains SOB with exertion.  Denies PND. Sleeps with HOB. Ongoing lower extremity edema. Appetite ok. No fever or chills. Weight at home 290-392 pounds. Taking all medications.  Labs (4/19): LFTs normal, K 4, creatinine 1.24, TSH normal, immunofixation normal Labs (8/19): TSH normal, K 3.8, creatinine 1.27, LFTs normal, TSH normal Labs (11/19): K 3.8, creatinine 1.21 Labs (2/20): LDL 114, hgb 10.5 Labs (3/20): K 4.4, creatinine 1.42 Labs (4/20): K 4.2, creatinine 1.23 Labs (7/21): K 3.8, creatinine 1.23 Labs (8/21): hgb 11.2 Labs (10/21): creatinine 2.1 Labs (10/21): creatinine 1.36, K 4.3   Labs (11/21): K 4.2, creatinine 1.46  Labs (2/22): K 3.9, creatinine 1.58  PMH: 1. Obese 2. Atrial fibrillation: Paroxysmal => chronic.  Unable to cardiovert in 3/15 due to LAA thrombus noted on TEE. TEE in 6/15 without LAA thrombus.  Patient had cardioversion to NSR with difficulty but back in atrial fibrillation by 7/15 appt.  She was started on amiodarone and cardioverted in 7/15.   - DCCV in 2/20 but back in atrial fibrillation by 3/20, has been in atrial fibrillation since that time.  3. H/o  TKR 4. Nonischemic cardiomyopathy: TEE (3/15) with EF 25-30%, moderate MR, PA systolic pressure 51 mmHg, moderate to severe TR, LA thrombus was noted.   LHC (3/15) with 50% stenosis small PLV.  TEE (6/15) with EF 35-40%, diffuse hypokinesis, mild MR, moderate TR, +PFO, no LA appendage thrombus.  - Echo (10/15) with EF 60-65%.   - ECHO 06/10/2016 EF 60-65% Mild AS - Echo (8/19): EF 60%, moderate LVH, mild aortic stenosis.  - Echo (11/21): EF 55-60%, mild/mod AS - PYP scan in 4/19 was grade 3, strongly suggestive of transthyretin amyloidosis.  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  5. CAD: Nonobstructive.  LHC (3/15) with 50% stenosis small PLV. 6. PFO 7. Left Breast mass: Was taken for lumpectomy in 1/18 but mass had resolved.  8. Aortic stenosis: Mild.  9. Carpal tunnel syndrome.  10. Vestibular schwannoma s/p gamma knife surgery at [facility].  11. OSA: Not currently using CPAP.  12. Hematochezia: C-scope/EGD in 7/21 showed only internal hemorrhoids.  13. 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    Current Outpatient Medications  Medication Sig Dispense Refill  . acetaminophen (TYLENOL) 650 MG CR tablet Take 1,300 mg by mouth every 8 (eight) hours as needed for pain.     [name] apixaban (ELIQUIS) 5 MG TABS tablet TAKE 1 TABLET (5 MG TOTAL) BY MOUTH 2 (TWO) TIMES DAILY. 60 tablet 5  . Cholecalciferol (VITAMIN D-3) 5000 UNITS TABS Take 5,000 Units by mouth daily.    . empagliflozin (JARDIANCE) 10 MG TABS tablet Take 1 tablet (10 mg total) by mouth daily before breakfast. 90 tablet 3  . Ferrous Sulfate 27 MG TABS Take 27 mg by mouth 2 (two) times daily.     . furosemide (LASIX) 40 MG tablet Take 80 mg by mouth daily.    . metoprolol succinate (TOPROL-XL) 100 MG 24 hr tablet Take 1 tablet (100 mg total) by mouth 2 (two) times daily. 180 tablet 0  . nitroGLYCERIN (NITROSTAT) 0.4 MG SL tablet Place 1 tablet (0.4 mg total) under the tongue every 5 (five) minutes as needed for chest pain. 30  tablet 1  . Omega-3 1000 MG CAPS Take by mouth.    . RESTASIS 0.05 % ophthalmic emulsion     . rosuvastatin (CRESTOR) 40 MG tablet Take 1 tablet (40 mg total) by mouth at bedtime. 90 tablet 3   No current facility-administered medications for this encounter.    Allergies:   Tramadol and Spironolactone   Social History:  The patient  reports that she has never smoked. She has never used smokeless tobacco. She reports that she does not drink alcohol and does not use drugs.   Family History:  The patient's family history includes CVA in her mother.   ROS:  Please see the history of present illness.   All other systems are personally reviewed and negative.   Vitals:   BP 130/80   Pulse 94   Wt (!) 175.4 kg (386 lb 9.6 oz)   SpO2 93%  BMI 62.40 kg/m  PHYSICAL EXAM: General:  Ambulated in the clinic with a cane. No resp difficulty HEENT: normal Neck: supple. JVP difficult to assess due to body habitus.no JVD. Carotids 2+ bilat; no bruits. No lymphadenopathy or thryomegaly appreciated. Cor: PMI nondisplaced. Regular rate & rhythm. No rubs, gallops or murmurs. Lungs: clear Abdomen: soft, nontender, nondistended. No hepatosplenomegaly. No bruits or masses. Good bowel sounds. Extremities: no cyanosis, clubbing, rash, R and LLE 2-3+ edema Neuro: alert & orientedx3, cranial nerves grossly intact. moves all 4 extremities w/o difficulty. 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  EF 35-40% on TEE in 6/15 but then EF back to 60-65% by 10/15 and remained normal on last echo in  8/19.  LHC without significant coronary disease in 3/15.  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 Echo 11/21 EF 55-60% mild/mod AS. She remains fluid overloaded on exam. NYHA class IIIb symptoms.  She is not interested in At Home.  Given IV lasix on 07/06/20.  NYHA III. Remains short of breath with exertion. Volume status elevated. Stop lasix. Start torsemide 60 mg daily.  - Continue  Jardiance 10 mg daily.  - Continue Toprol XL 100 mg bid. - Off Spiro due to itching. - no longer on ACEi due to elevated SCr (rose to 2.0, improved down to 1.3 after discontinuation).  - Check BMET today and in 7 days. 2. 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  Atrial fibrillation and carpal tunnel syndrome, both of which she has, go along with amyloidosis.  Her history of orthostatic symptoms as well as symptoms in toes and feet consistent with peripheral neuropathy are also likely related to amyloidosis.  - Continue tafamidis.   - Given hereditary TTR amyloidosis. We will see if I can get her started on patisiran as well to help with her peripheral neuropathy.  - She has been refer to Dr. Broadus John for genetic counseling.   3. Atrial fibrillation: Last DCCV in 2/20 but back in atrial fibrillation since then.  Dr. Rayann Heman saw, decided against atrial fibrillation ablation due to morbid obesity.  EP decided against Tikosyn due to compliance concerns. Recommendation was to continue amiodarone, start CPAP, and retry DCCV while compliant with CPAP.  However, she is not using CPAP due to her tinnitus. Her afib is now permanent. Amiodarone was discontinued 10/21. She is well rate controlled w/  blocker. - Continue Toprol XL 100 mg bid.  - Continue Eliquis. Hgb 12 (1/22).  4. CAD: Mild,  nonobstructive by cath 07/2013. - No chest pain.  - On Jardiance.    - No ASA given Eliquis use.   - Continue crestor 40 mg daily.  5. Bilateral carpal tunnel symptoms: Likely related to amyloidosis.  6. OSA: She says that she cannot tolerate CPAP along with her tinnitus.  7. Morbid obesity: Body mass index is 62.4 kg/m. - Encouraged portion control and increasing physical activity as tolerated. -  Stressed importance of limiting fluids and high-salt foods. 8. DMII 11/9 Hgb A1C 6 Started on jardiance. 9. Obesity  Body mass index is 62.4 kg/m. Discussed limiting portions.    Follow up in next week to reassess volume status. Check BMET today and in 7 days. Discussed low salt food choices and limiting fluid intake to < 2 liters per day.   Greater than 50% of the (total minutes 25) visit spent in counseling/coordination of care regarding the above changes.   Jeanmarie Hubert, NP-C 07/10/2020  [facility] [address] [facility] [place] [address] [phone] (office) [phone] (fax)

## 2020-07-16 ENCOUNTER — Ambulatory Visit (HOSPITAL_COMMUNITY)
Admission: RE | Admit: 2020-07-16 | Discharge: 2020-07-16 | Disposition: A | Discharge status: Home / Self Care | Payer: Medicare HMO | Source: Ambulatory Visit | Attending: Family Medicine | Admitting: Family Medicine

## 2020-07-16 ENCOUNTER — Other Ambulatory Visit: Payer: Self-pay

## 2020-07-16 ENCOUNTER — Encounter (HOSPITAL_COMMUNITY): Payer: Self-pay

## 2020-07-16 VITALS — BP 126/83 | HR 90 | Wt 384.4 lb

## 2020-07-16 DIAGNOSIS — I480 Paroxysmal atrial fibrillation: Secondary | ICD-10-CM | POA: Diagnosis not present

## 2020-07-16 DIAGNOSIS — I428 Other cardiomyopathies: Secondary | ICD-10-CM | POA: Insufficient documentation

## 2020-07-16 DIAGNOSIS — Z7901 Long term (current) use of anticoagulants: Secondary | ICD-10-CM | POA: Insufficient documentation

## 2020-07-16 DIAGNOSIS — E854 Organ-limited amyloidosis: Secondary | ICD-10-CM | POA: Diagnosis not present

## 2020-07-16 DIAGNOSIS — I4891 Unspecified atrial fibrillation: Secondary | ICD-10-CM | POA: Diagnosis not present

## 2020-07-16 DIAGNOSIS — Z79899 Other long term (current) drug therapy: Secondary | ICD-10-CM | POA: Diagnosis not present

## 2020-07-16 DIAGNOSIS — I5042 Chronic combined systolic (congestive) and diastolic (congestive) heart failure: Secondary | ICD-10-CM | POA: Diagnosis present

## 2020-07-16 DIAGNOSIS — G4733 Obstructive sleep apnea (adult) (pediatric): Secondary | ICD-10-CM | POA: Diagnosis not present

## 2020-07-16 DIAGNOSIS — I5022 Chronic systolic (congestive) heart failure: Secondary | ICD-10-CM | POA: Diagnosis not present

## 2020-07-16 DIAGNOSIS — I43 Cardiomyopathy in diseases classified elsewhere: Secondary | ICD-10-CM

## 2020-07-16 DIAGNOSIS — G5603 Carpal tunnel syndrome, bilateral upper limbs: Secondary | ICD-10-CM

## 2020-07-16 DIAGNOSIS — Z6841 Body Mass Index (BMI) 40.0 and over, adult: Secondary | ICD-10-CM | POA: Diagnosis not present

## 2020-07-16 DIAGNOSIS — I2510 Atherosclerotic heart disease of native coronary artery without angina pectoris: Secondary | ICD-10-CM | POA: Diagnosis not present

## 2020-07-16 LAB — BASIC METABOLIC PANEL
Anion gap: 9 (ref 5–15)
BUN: 19 mg/dL (ref 8–23)
CO2: 28 mmol/L (ref 22–32)
Calcium: 9.2 mg/dL (ref 8.9–10.3)
Chloride: 104 mmol/L (ref 98–111)
Creatinine, Ser: 1.43 mg/dL — ABNORMAL HIGH (ref 0.44–1.00)
GFR, Estimated: 41 mL/min — ABNORMAL LOW (ref >60)
Glucose, Bld: 102 mg/dL — ABNORMAL HIGH (ref 70–99)
Potassium: 3.4 mmol/L — ABNORMAL LOW (ref 3.5–5.1)
Sodium: 141 mmol/L (ref 135–145)

## 2020-07-16 MED ORDER — TORSEMIDE 20 MG PO TABS
80.0000 mg | ORAL_TABLET | Freq: Every day | ORAL | 3 refills | Status: DC
Start: 2020-07-16 — End: 2020-07-27

## 2020-07-16 NOTE — Progress Notes (Signed)
Advanced Heart Failure Clinic Note  Date:  07/16/2020   PCP:  Windell Hummingbird, PA-C  HF Cardiologist:  Dr. Aundra Dubin   History of Present Illness: Kimberly [name] is a 65 y.o. female who has a history of paroxysmal atrial fibrillation, obesity, and nonischemic cardiomyopathy.  She was admitted in 3/15 at [facility] with atrial fibrillation and CHF.  TEE was done in preparation for DCCV, showing EF 25-30% but there was LA thrombus so DCCV was not done.  Plan was for anticoagulation x 3 months followed by TEE-guided DCCV in the OR (due to patient's body habitus).  LHC was also done, showing mild nonobstructive CAD.  Patient was diuresed with IV Lasix in the hospital and started on apixaban.   Patient had TEE again in 6/15, showing EF 35-40% with diffuse hypokinesis and no LAA thrombus.  She was cardioverted to NSR (with difficulty).  I started her at that time on amiodarone.  She then went back into atrial fibrillation.  After she had been loaded on amiodarone, cardioverted her again in 7/15 to NSR.    She had gone back into atrial fibrillation in 1/18 and amiodarone was increased to bid in preparation for DCCV. However, she spontaneously converted to NSR.  PYP scan in 4/19 was grade 3, strongly suggestive of TTR amyloidosis.  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  Echo 8/19 showed EF 60%, moderate LVH, mild aortic stenosis.  Of note, she has carpal tunnel syndrome.  She was started on tafamidis.   She went back into atrial fibrillation and had DCCV in 2/20 but was back in atrial fibrillation a few weeks later when she went to atrial fibrillation clinic.  Dr. Rayann Heman thought that morbid obesity precluded atrial fibrillation ablation and concerns about compliance led them not to start her on Tikosyn.  Instead, it was recommended that she start on CPAP for her OSA, lose weight, and re-attempt DCCV while on amiodarone.   Unfortunately, she had been lost to f/u in the  [facility] for ~1.5 years.  She was still taking amiodarone 200 mg bid but has remained in persistent atrial fibrillation.  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  In 7/21, she developed hematochezia and had EGD/colonoscopy which were unrevealing except for internal hemorrhoids.   Recently seen in clinic was noted to be fluid overloaded. Increased Lasix to 60 qam/40 qpm. Given elevated SCr at 2, stopped lisinopril and added amlodipine for BP. Repeat SCr improved, down to 1.3.  She returned back to clinic 11/21 and she remained fluid overloaded. She increased her lasix as instructed but stopped taking her spiro. Spiro 25 mg restarted, and lasix 60 mg qam/40 mg q pm continued. Had not taken lasix in 7 days.   She returned 1/22 for HF follow up. Has been out of her lasix for the past 7 days. Weight down 13 lbs. Lasix 60/40 restarted and Jardiance started.  Today she returns for HF follow up. She was volume overloaded last week, given 80 mg IV lasix in clinic. Lasix then changed to torsemide, and she was started on Jardiance. Overall feeling fine, SOB with minimal activity. Denies increasing SOB, CP, dizziness, edema, or PND/Orthopnea. Appetite ok. No fever or chills. Weight at home 380-384 pounds. Taking all medications, has been taking her torsemide 20 mg tid. Had thyroid biopsy today.  Labs (4/19): LFTs normal, K 4, creatinine 1.24, TSH  normal, immunofixation normal Labs (8/19): TSH normal, K 3.8, creatinine 1.27, LFTs normal, TSH normal Labs (11/19): K 3.8, creatinine 1.21 Labs (2/20): LDL 114, hgb 10.5 Labs (3/20): K 4.4, creatinine 1.42 Labs (4/20): K 4.2, creatinine 1.23 Labs (7/21): K 3.8, creatinine 1.23 Labs (8/21): hgb 11.2 Labs (10/21): creatinine 2.1 Labs (10/21): creatinine 1.36, K 4.3   Labs (11/21): K 4.2, creatinine 1.46 Labs (2/22): K 3.9, creatinine 1.58  PMH: 1. Obese 2. Atrial fibrillation:  Paroxysmal => chronic.  Unable to cardiovert in 3/15 due to LAA thrombus noted on TEE. TEE in 6/15 without LAA thrombus.  Patient had cardioversion to NSR with difficulty but back in atrial fibrillation by 7/15 appt.  She was started on amiodarone and cardioverted in 7/15.   - DCCV in 2/20 but back in atrial fibrillation by 3/20, has been in atrial fibrillation since that time.  3. H/o TKR 4. Nonischemic cardiomyopathy: TEE (3/15) with EF 25-30%, moderate MR, PA systolic pressure 51 mmHg, moderate to severe TR, LA thrombus was noted.   LHC (3/15) with 50% stenosis small PLV.  TEE (6/15) with EF 35-40%, diffuse hypokinesis, mild MR, moderate TR, +PFO, no LA appendage thrombus.  - Echo (10/15) with EF 60-65%.   - ECHO 06/10/2016 EF 60-65% Mild AS - Echo (8/19): EF 60%, moderate LVH, mild aortic stenosis.  - Echo (11/21): EF 55-60%, mild/mod AS - PYP scan in 4/19 was grade 3, strongly suggestive of transthyretin amyloidosis.  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  5. CAD: Nonobstructive.  LHC (3/15) with 50% stenosis small PLV. 6. PFO 7. Left Breast mass: Was taken for lumpectomy in 1/18 but mass had resolved.  8. Aortic stenosis: Mild.  9. Carpal tunnel syndrome.  10. Vestibular schwannoma s/p gamma knife surgery at [facility].  11. OSA: Not currently using CPAP.  12. Hematochezia: C-scope/EGD in 7/21 showed only internal hemorrhoids.  13. 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    Current Outpatient Medications  Medication Sig Dispense Refill  . acetaminophen (TYLENOL) 650 MG CR tablet Take 1,300 mg by mouth every 8 (eight) hours as needed for pain.     [name] apixaban (ELIQUIS) 5 MG TABS tablet TAKE 1 TABLET (5 MG TOTAL) BY MOUTH 2 (TWO) TIMES DAILY. 60 tablet 5  . Cholecalciferol (VITAMIN D-3) 5000 UNITS TABS Take 5,000 Units by mouth daily.    . empagliflozin (JARDIANCE) 10 MG TABS tablet Take 1 tablet (10 mg total) by mouth daily before breakfast. 90 tablet 3  . Ferrous Sulfate 27 MG  TABS Take 27 mg by mouth 2 (two) times daily.     . metoprolol succinate (TOPROL-XL) 100 MG 24 hr tablet Take 1 tablet (100 mg total) by mouth 2 (two) times daily. 180 tablet 0  . nitroGLYCERIN (NITROSTAT) 0.4 MG SL tablet Place 1 tablet (0.4 mg total) under the tongue every 5 (five) minutes as needed for chest pain. 30 tablet 1  . Omega-3 1000 MG CAPS Take by mouth.    . RESTASIS 0.05 % ophthalmic emulsion     . rosuvastatin (CRESTOR) 40 MG tablet Take 1 tablet (40 mg total) by mouth at bedtime. 90 tablet 3  . torsemide (DEMADEX) 20 MG tablet Take 3 tablets (60 mg total) by mouth daily. 90 tablet 3   No current facility-administered medications for this encounter.    Allergies:   Tramadol and Spironolactone   Social History:  The patient  reports that she has never smoked. She has never used smokeless tobacco. She reports  that she does not drink alcohol and does not use drugs.   Family History:  The patient's family history includes CVA in her mother.   ROS:  Please see the history of present illness.   All other systems are personally reviewed and negative.   Vitals:   BP 126/83   Pulse 90   Wt (!) 174.4 kg (384 lb 6.4 oz)   SpO2 94%   BMI 62.04 kg/m  PHYSICAL EXAM: General:  NAD. No resp difficulty. HEENT: Normal Neck: Supple. Thick neck, unable to assess JVP. Carotids 2+ bilat; no bruits. No lymphadenopathy or thryomegaly appreciated. Cor: PMI nondisplaced. Irregular rate & rhythm. No rubs, gallops, II/VI SEM RUSB. Lungs: Clear Abdomen: Obese, nontender, +distended. No hepatosplenomegaly. No bruits or masses. Good bowel sounds. Extremities: No cyanosis, clubbing, rash, 2-3+ bilateral lower extremity pitting edema Neuro: Alert & oriented x 3, cranial nerves grossly intact. Moves all 4 extremities w/o difficulty. Affect pleasant.  Recent Labs: 03/01/2020: ALT 14; TSH 1.404 05/31/2020: Hemoglobin 12.0; Platelets 152 07/06/2020: B Natriuretic Peptide 96.3 07/10/2020: BUN 17;  Creatinine, Ser 1.38; Potassium 3.2; Sodium 143  Personally reviewed   Wt Readings from Last 3 Encounters:  07/16/20 (!) 174.4 kg (384 lb 6.4 oz)  07/10/20 (!) 175.4 kg (386 lb 9.6 oz)  07/06/20 (!) 177.9 kg (392 lb 3.2 oz)    ASSESSMENT AND PLAN: 1. 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  EF 35-40% on TEE in 6/15 but then EF back to 60-65% by 10/15 and remained normal on last echo in 8/19.  LHC without significant coronary disease in 3/15.  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 Echo 11/21 EF 55-60% mild/mod AS. She remains fluid overloaded on exam today. NYHA class IIIb symptoms, confounded by morbid obesity. - Increase torsemide to 80 mg daily. BMET today, repeat 7-10 days. Instructed to take 80 mg at once, do not spread throughout the day. - Continue Jardiance 10 mg daily. A1C 6.0 (11/21). No GU symptoms. - Continue Toprol XL 100 mg bid. - Off Spiro due to itching. - no longer on ACEi due to elevated SCr (rose to 2.0, improved down to 1.3 after discontinuation).  2. 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  Atrial fibrillation and carpal tunnel syndrome, both of which she has, go along with amyloidosis.  Her history of orthostatic symptoms as well as symptoms in toes and feet consistent with peripheral neuropathy are also likely related to amyloidosis.  - Continue tafamidis.   - Given hereditary TTR amyloidosis. We will see if I can get her started on patisiran as well to help with her peripheral neuropathy.  - She has been refer to Dr. Broadus John for genetic counseling.   3. Atrial fibrillation: Last DCCV in 2/20 but back in atrial fibrillation since then.  Dr. Rayann Heman saw, decided against atrial fibrillation ablation due to morbid  obesity.  EP decided against Tikosyn due to compliance concerns. Recommendation was to continue amiodarone, start CPAP, and retry DCCV while compliant with CPAP.  However, she is not using CPAP due to her tinnitus. Her afib is now permanent. Amiodarone was discontinued 10/21. She is well rate controlled w/  blocker. - Continue Toprol XL 100 mg bid.  - Continue Eliquis. Hgb 12 (1/22). No bleeding. 4. CAD: Mild, nonobstructive by cath 07/2013. Stable w/o CP.   -  No ASA given Eliquis use.   - Continue crestor 40 mg daily.  5. Bilateral carpal tunnel symptoms: Likely related to amyloidosis.  6. OSA: She says that she cannot tolerate CPAP along with her tinnitus.  7. Morbid obesity: Body mass index is 62.04 kg/m. - Encouraged portion control and increasing physical activity as tolerated. - Stressed importance of limiting fluids and high-salt foods.  Follow back in 3-4 weeks to assess fluid status.  Signed, Rafael Bihari, FNP-BC 07/16/2020  [facility] [address] [facility] [place] [address] [phone] (office) [phone] (fax)

## 2020-07-16 NOTE — Patient Instructions (Signed)
INCREASE Torsemide to 80 mg (4 tabs) daily  Labs today We will only contact you if something comes back abnormal or we need to make some changes. Otherwise no news is good news!  Labs needed in 7-10 days  Your physician recommends that you schedule a follow-up appointment in: 3-4 weeks  in the Advanced Practitioners (PA/NP) Clinic    Do the following things EVERYDAY: 1) Weigh yourself in the morning before breakfast. Write it down and keep it in a log. 2) Take your medicines as prescribed 3) Eat low salt foods--Limit salt (sodium) to 2000 mg per day.  4) Stay as active as you can everyday  5) Limit all fluids for the day to less than 2 liters  At the [facility], you and your health needs are our priority. As part of our continuing mission to provide you with exceptional heart care, we have created designated Provider Care Teams. These Care Teams include your primary Cardiologist (physician) and Advanced Practice Providers (APPs- Physician Assistants and Nurse Practitioners) who all work together to provide you with the care you need, when you need it.   You may see any of the following providers on your designated Care Team at your next follow up: [name] Dr Glori Bickers . Dr [name] . Dr Vickki Muff . Darrick Grinder, NP . Lyda Jester, [place] . Audry Riles, PharmD   Please be sure to bring in all your medications bottles to every appointment.   If you have any questions or concerns before your next appointment please send [place] a message through [place] or call our office at [phone].    TO LEAVE A MESSAGE FOR THE NURSE SELECT OPTION 2, PLEASE LEAVE A MESSAGE INCLUDING: . YOUR NAME . DATE OF BIRTH . CALL BACK NUMBER . REASON FOR CALL**this is important as we prioritize the call backs  YOU WILL RECEIVE A CALL BACK THE SAME DAY AS LONG AS YOU CALL BEFORE 4:00 PM  Please see our updated No Show and Same Day Appointment Cancellation  Policy attached to your AVS.

## 2020-07-17 ENCOUNTER — Telehealth (HOSPITAL_COMMUNITY): Payer: Self-pay

## 2020-07-17 MED ORDER — POTASSIUM CHLORIDE ER 10 MEQ PO TBCR: 40.0000 meq | EXTENDED_RELEASE_TABLET | Freq: Every day | ORAL | 2 refills | Status: DC

## 2020-07-17 NOTE — Telephone Encounter (Signed)
-----   Message from Rafael Bihari, [place] sent at 07/16/2020  5:21 PM EST ----- Potassium is low. Is she on 40 mEw KCl supplement? daily If not, please start. Has repeat bmet scheduled 7-10 days.

## 2020-07-17 NOTE — Telephone Encounter (Signed)
Malena Edman, RN  07/17/2020 10:07 AM EST Back to Top     Patient advised and verbalized understanding. Patient denies taking KCL supplements, but will start 40meq daily. Med list updated

## 2020-07-26 ENCOUNTER — Ambulatory Visit (HOSPITAL_COMMUNITY)
Admission: RE | Admit: 2020-07-26 | Discharge: 2020-07-26 | Disposition: A | Discharge status: Home / Self Care | Payer: Medicare HMO | Source: Ambulatory Visit | Attending: Family Medicine | Admitting: Family Medicine

## 2020-07-26 ENCOUNTER — Other Ambulatory Visit: Payer: Self-pay

## 2020-07-26 DIAGNOSIS — I5022 Chronic systolic (congestive) heart failure: Secondary | ICD-10-CM | POA: Diagnosis not present

## 2020-07-26 LAB — BASIC METABOLIC PANEL
Anion gap: 9 (ref 5–15)
BUN: 32 mg/dL — ABNORMAL HIGH (ref 8–23)
CO2: 33 mmol/L — ABNORMAL HIGH (ref 22–32)
Calcium: 9.6 mg/dL (ref 8.9–10.3)
Chloride: 98 mmol/L (ref 98–111)
Creatinine, Ser: 1.9 mg/dL — ABNORMAL HIGH (ref 0.44–1.00)
GFR, Estimated: 29 mL/min — ABNORMAL LOW (ref >60)
Glucose, Bld: 104 mg/dL — ABNORMAL HIGH (ref 70–99)
Potassium: 3.3 mmol/L — ABNORMAL LOW (ref 3.5–5.1)
Sodium: 140 mmol/L (ref 135–145)

## 2020-07-27 ENCOUNTER — Telehealth (HOSPITAL_COMMUNITY): Payer: Self-pay | Admitting: Cardiology

## 2020-07-27 DIAGNOSIS — I5022 Chronic systolic (congestive) heart failure: Secondary | ICD-10-CM

## 2020-07-27 MED ORDER — TORSEMIDE 20 MG PO TABS: 60.0000 mg | ORAL_TABLET | Freq: Every day | ORAL | 3 refills | Status: DC

## 2020-07-27 NOTE — Telephone Encounter (Signed)
-----   Message from [place], NP sent at 07/27/2020  1:21 PM EST ----- Renal function elevated. Hold torsemide x 2 days then cut back torsemide to 60 mg daily. Continue current dose of potassium. Check BMET in 7 days.

## 2020-07-27 NOTE — Telephone Encounter (Signed)
Pt aware and voiced understanding Repeat labs 3/18

## 2020-07-27 NOTE — Addendum Note (Signed)
Addended by: Kerry Dory on: 07/27/2020 03:46 PM   Modules accepted: Orders

## 2020-08-03 ENCOUNTER — Ambulatory Visit (HOSPITAL_COMMUNITY)
Admission: RE | Admit: 2020-08-03 | Discharge: 2020-08-03 | Disposition: A | Discharge status: Home / Self Care | Payer: Medicare HMO | Source: Ambulatory Visit | Attending: Cardiology | Admitting: Cardiology

## 2020-08-03 ENCOUNTER — Other Ambulatory Visit: Payer: Self-pay

## 2020-08-03 DIAGNOSIS — I5022 Chronic systolic (congestive) heart failure: Secondary | ICD-10-CM

## 2020-08-03 LAB — BASIC METABOLIC PANEL
Anion gap: 10 (ref 5–15)
BUN: 24 mg/dL — ABNORMAL HIGH (ref 8–23)
CO2: 31 mmol/L (ref 22–32)
Calcium: 9.2 mg/dL (ref 8.9–10.3)
Chloride: 99 mmol/L (ref 98–111)
Creatinine, Ser: 1.69 mg/dL — ABNORMAL HIGH (ref 0.44–1.00)
GFR, Estimated: 34 mL/min — ABNORMAL LOW (ref >60)
Glucose, Bld: 99 mg/dL (ref 70–99)
Potassium: 3.6 mmol/L (ref 3.5–5.1)
Sodium: 140 mmol/L (ref 135–145)

## 2020-08-13 ENCOUNTER — Encounter (HOSPITAL_COMMUNITY): Payer: Medicare HMO

## 2020-08-26 ENCOUNTER — Other Ambulatory Visit (HOSPITAL_COMMUNITY): Payer: Self-pay | Admitting: Cardiology

## 2020-09-10 ENCOUNTER — Other Ambulatory Visit (HOSPITAL_COMMUNITY): Payer: Self-pay | Admitting: Family Medicine

## 2020-09-17 NOTE — Progress Notes (Signed)
Advanced Heart Failure Clinic Note  Date:  09/18/2020   PCP:  Windell Hummingbird, PA-C  HF Cardiologist:  Dr. Aundra Dubin   History of Present Illness: Kimberly [name] is a 65 y.o. female who has a history of paroxysmal atrial fibrillation, obesity, and nonischemic cardiomyopathy.  She was admitted in 3/15 at [facility] with atrial fibrillation and CHF.  TEE was done in preparation for DCCV, showing EF 25-30% but there was LA thrombus so DCCV was not done.  Plan was for anticoagulation x 3 months followed by TEE-guided DCCV in the OR (due to patient's body habitus).  LHC was also done, showing mild nonobstructive CAD.  Patient was diuresed with IV Lasix in the hospital and started on apixaban.   Patient had TEE again in 6/15, showing EF 35-40% with diffuse hypokinesis and no LAA thrombus.  She was cardioverted to NSR (with difficulty).  I started her at that time on amiodarone.  She then went back into atrial fibrillation.  After she had been loaded on amiodarone, cardioverted her again in 7/15 to NSR.    She had gone back into atrial fibrillation in 1/18 and amiodarone was increased to bid in preparation for DCCV. However, she spontaneously converted to NSR.  PYP scan in 4/19 was grade 3, strongly suggestive of TTR amyloidosis.  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  Echo 8/19 showed EF 60%, moderate LVH, mild aortic stenosis.  Of note, she has carpal tunnel syndrome.  She was started on tafamidis.   She went back into atrial fibrillation and had DCCV in 2/20 but was back in atrial fibrillation a few weeks later when she went to atrial fibrillation clinic.  Dr. Rayann Heman thought that morbid obesity precluded atrial fibrillation ablation and concerns about compliance led them not to start her on Tikosyn.  Instead, it was recommended that she start on CPAP for her OSA, lose weight, and re-attempt DCCV while on amiodarone.   Unfortunately, she had been lost to f/u in the  [facility] for ~1.5 years.  She was still taking amiodarone 200 mg bid but has remained in persistent atrial fibrillation.  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  In 7/21, she developed hematochezia and had EGD/colonoscopy which were unrevealing except for internal hemorrhoids.   Recently seen in clinic was noted to be fluid overloaded. Increased Lasix to 60 qam/40 qpm. Given elevated SCr at 2, stopped lisinopril and added amlodipine for BP. Repeat SCr improved, down to 1.3.  She returned back to clinic 11/21 and she remained fluid overloaded. She increased her lasix as instructed but stopped taking her spiro. Spiro 25 mg restarted, and lasix 60 mg qam/40 mg q pm continued. Had not taken lasix in 7 days.   She returned 1/22 for HF follow up. Has been out of her lasix for the past 7 days. Weight down 13 lbs. Lasix 60/40 restarted and Jardiance started.  Last seen in the clinic the end of February and was instructed to increase torsemide to 80 mg daily. BMET checked a week later and had creatinine bump so torsemide was cut back to 60 mg daily.   Today she returns for HF follow up.Overall feeling fine. Remains SOB with exertion. Denies PND/Orthopnea. Limited by knee pain.  Drinking > 64 ounces of fluid. Eats lots of ice. Appetite ok. No fever or chills. She has not been weighing at home.  Taking all medications. She does not want to take extra torsemide.   Labs (4/19): LFTs normal, K 4, creatinine 1.24, TSH normal, immunofixation normal Labs (8/19): TSH normal, K 3.8, creatinine 1.27, LFTs normal, TSH normal Labs (11/19): K 3.8, creatinine 1.21 Labs (2/20): LDL 114, hgb 10.5 Labs (3/20): K 4.4, creatinine 1.42 Labs (4/20): K 4.2, creatinine 1.23 Labs (7/21): K 3.8, creatinine 1.23 Labs (8/21): hgb 11.2 Labs (10/21): creatinine 2.1 Labs (10/21): creatinine 1.36, K 4.3   Labs (11/21): K 4.2, creatinine 1.46 Labs  (2/22): K 3.9, creatinine 1.58 Labs (08/03/20): K 3.6 Creatinine 1.7   PMH: 1. Obese 2. Atrial fibrillation: Paroxysmal => chronic.  Unable to cardiovert in 3/15 due to LAA thrombus noted on TEE. TEE in 6/15 without LAA thrombus.  Patient had cardioversion to NSR with difficulty but back in atrial fibrillation by 7/15 appt.  She was started on amiodarone and cardioverted in 7/15.   - DCCV in 2/20 but back in atrial fibrillation by 3/20, has been in atrial fibrillation since that time.  3. H/o TKR 4. Nonischemic cardiomyopathy: TEE (3/15) with EF 25-30%, moderate MR, PA systolic pressure 51 mmHg, moderate to severe TR, LA thrombus was noted.   LHC (3/15) with 50% stenosis small PLV.  TEE (6/15) with EF 35-40%, diffuse hypokinesis, mild MR, moderate TR, +PFO, no LA appendage thrombus.  - Echo (10/15) with EF 60-65%.   - ECHO 06/10/2016 EF 60-65% Mild AS - Echo (8/19): EF 60%, moderate LVH, mild aortic stenosis.  - Echo (11/21): EF 55-60%, mild/mod AS - PYP scan in 4/19 was grade 3, strongly suggestive of transthyretin amyloidosis.  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  5. CAD: Nonobstructive.  LHC (3/15) with 50% stenosis small PLV. 6. PFO 7. Left Breast mass: Was taken for lumpectomy in 1/18 but mass had resolved.  8. Aortic stenosis: Mild.  9. Carpal tunnel syndrome.  10. Vestibular schwannoma s/p gamma knife surgery at [facility].  11. OSA: Not currently using CPAP.  12. Hematochezia: C-scope/EGD in 7/21 showed only internal hemorrhoids.  13. 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    Current Outpatient Medications  Medication Sig Dispense Refill  . acetaminophen (TYLENOL) 650 MG CR tablet Take 1,300 mg by mouth every 8 (eight) hours as needed for pain.     . Cholecalciferol (VITAMIN D-3) 5000 UNITS TABS Take 5,000 Units by mouth daily.    Mariane Baumgarten Sodium (DSS) 100 MG CAPS Take by mouth.    Arne Cleveland 5 MG TABS tablet TAKE 1 TABLET TWICE DAILY 180 tablet 3  . empagliflozin  (JARDIANCE) 10 MG TABS tablet Take 1 tablet (10 mg total) by mouth daily before breakfast. 90 tablet 3  . Ferrous Sulfate 27 MG TABS Take 27 mg by mouth 2 (two) times daily.     . fluticasone furoate-vilanterol (BREO ELLIPTA) 100-25 MCG/INH AEPB     . ketoconazole (NIZORAL) 2 % cream     . metoprolol succinate (TOPROL-XL) 100 MG 24 hr tablet TAKE 1 TABLET TWICE DAILY 180 tablet 3  . nitroGLYCERIN (NITROSTAT) 0.4 MG SL tablet Place 1 tablet (0.4 mg total) under the tongue every 5 (five) minutes as needed for chest pain. 30 tablet 1  . Omega-3 1000 MG CAPS Take by mouth.    . potassium chloride (KLOR-CON) 10 MEQ tablet Take 4 tablets (40 mEq total) by mouth daily. 360 tablet 3  . RESTASIS 0.05 % ophthalmic emulsion Place 1 drop into both eyes daily as needed.    [name]  rosuvastatin (CRESTOR) 40 MG tablet Take 1 tablet (40 mg total) by mouth at bedtime. 90 tablet 3  . torsemide (DEMADEX) 20 MG tablet Take 3 tablets (60 mg total) by mouth daily. 120 tablet 3   No current facility-administered medications for this encounter.    Allergies:   Tramadol and Spironolactone   Social History:  The patient  reports that she has never smoked. She has never used smokeless tobacco. She reports that she does not drink alcohol and does not use drugs.   Family History:  The patient's family history includes CVA in her mother.   ROS:  Please see the history of present illness.   All other systems are personally reviewed and negative.   Vitals:   BP 120/90   Pulse 78   Wt (!) 173 kg (381 lb 6.4 oz)   SpO2 99%   BMI 61.56 kg/m  PHYSICAL EXAM: General:  Walked in the clinic with a cane. No resp difficulty HEENT: normal Neck: supple. JVP difficult to assess due to body habitus. Carotids 2+ bilat; no bruits. No lymphadenopathy or thryomegaly appreciated. Cor: PMI nondisplaced. Irregular rate & rhythm. No rubs, gallops or murmurs. Lungs: clear Abdomen: soft, nontender, nondistended. No hepatosplenomegaly. No  bruits or masses. Good bowel sounds. Extremities: no cyanosis, clubbing, rash, R and LLE 1+ edema Neuro: alert & orientedx3, cranial nerves grossly intact. moves all 4 extremities w/o difficulty. Affect pleasant  Recent Labs: 03/01/2020: ALT 14; TSH 1.404 05/31/2020: Hemoglobin 12.0; Platelets 152 07/06/2020: B Natriuretic Peptide 96.3 08/03/2020: BUN 24; Creatinine, Ser 1.69; Potassium 3.6; Sodium 140  Personally reviewed   Wt Readings from Last 3 Encounters:  09/18/20 (!) 173 kg (381 lb 6.4 oz)  07/16/20 (!) 174.4 kg (384 lb 6.4 oz)  07/10/20 (!) 175.4 kg (386 lb 9.6 oz)    ASSESSMENT AND PLAN: 1. 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  EF 35-40% on TEE in 6/15 but then EF back to 60-65% by 10/15 and remained normal on last echo in 8/19.  LHC without significant coronary disease in 3/15.  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 Echo 11/21 EF 55-60% mild/mod AS.   NYHA III. Volume status mildly elevated but she does not want to increase daily torsemide. Continue torsemide 60 mg daily. Instructed to take an extra 20 mg torsemide for 3 pound weight gain.  - Continue Jardiance 10 mg daily.  - Continue Toprol XL 100 mg bid. - Off Spiro due to itching. Check BMEt  - no longer on ACEi due to elevated SCr (rose to 2.0, improved down to 1.3 after discontinuation).  2. 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  Atrial fibrillation and carpal tunnel syndrome, both of which she has, go along with amyloidosis.  Her history of orthostatic symptoms as well as symptoms in toes and feet consistent with peripheral neuropathy are also likely related to amyloidosis.  - Discontinued tafamidis 2020 ? - Given hereditary TTR amyloidosis. We will see  if can get her started on patisiran as well to help with her peripheral neuropathy.  - She has been refer to Dr. Broadus John for genetic counseling.   3. Atrial fibrillation: Last DCCV in 2/20 but back in atrial fibrillation since then.  Dr. Rayann Heman saw, decided against atrial fibrillation ablation due to morbid obesity.  EP decided against Tikosyn due  to compliance concerns. Recommendation was to continue amiodarone, start CPAP, and retry DCCV while compliant with CPAP.  However, she is not using CPAP due to her tinnitus. Her afib is now permanent. Amiodarone was discontinued 10/21. She is well rate controlled w/  blocker. - Rate controlled. No bleeding issues.  - Continue Toprol XL 100 mg bid.  - Continue Eliquis. Hgb 12 (1/22). No bleeding. 4. CAD: Mild, nonobstructive by cath 07/2013. No chest pain.  - No ASA given Eliquis use.   - Continue crestor 40 mg daily.  5. Bilateral carpal tunnel symptoms: Likely related to amyloidosis.  6. OSA: She says that she cannot tolerate CPAP along with her tinnitus.  7. Morbid obesity: Body mass index is 61.56 kg/m.  Discussed portion control.    Need to find out what happened to the Tafamidis. Will reach out to pharmacy team.  Discussed daily weights . Recommend taking extra 20 mg torsemide for 3 pound weight gain.   Jeanmarie Hubert, NP-C  09/18/2020  [facility] [address] [facility] and [place] [address] [phone] (office) [phone] (fax)

## 2020-09-18 ENCOUNTER — Encounter (HOSPITAL_COMMUNITY): Payer: Medicare HMO

## 2020-09-18 ENCOUNTER — Other Ambulatory Visit: Payer: Self-pay

## 2020-09-18 ENCOUNTER — Other Ambulatory Visit (HOSPITAL_COMMUNITY): Payer: Self-pay

## 2020-09-18 ENCOUNTER — Telehealth (HOSPITAL_COMMUNITY): Payer: Self-pay | Admitting: Pharmacy Technician

## 2020-09-18 ENCOUNTER — Ambulatory Visit (HOSPITAL_COMMUNITY)
Admission: RE | Admit: 2020-09-18 | Discharge: 2020-09-18 | Disposition: A | Discharge status: Home / Self Care | Payer: Medicare HMO | Source: Ambulatory Visit | Attending: Adult Health | Admitting: Adult Health

## 2020-09-18 ENCOUNTER — Encounter (HOSPITAL_COMMUNITY): Payer: Self-pay

## 2020-09-18 VITALS — BP 120/90 | HR 78 | Wt 381.4 lb

## 2020-09-18 DIAGNOSIS — Z7901 Long term (current) use of anticoagulants: Secondary | ICD-10-CM | POA: Insufficient documentation

## 2020-09-18 DIAGNOSIS — H9319 Tinnitus, unspecified ear: Secondary | ICD-10-CM | POA: Insufficient documentation

## 2020-09-18 DIAGNOSIS — E854 Organ-limited amyloidosis: Secondary | ICD-10-CM

## 2020-09-18 DIAGNOSIS — I480 Paroxysmal atrial fibrillation: Secondary | ICD-10-CM | POA: Diagnosis not present

## 2020-09-18 DIAGNOSIS — I5022 Chronic systolic (congestive) heart failure: Secondary | ICD-10-CM

## 2020-09-18 DIAGNOSIS — I43 Cardiomyopathy in diseases classified elsewhere: Secondary | ICD-10-CM | POA: Diagnosis not present

## 2020-09-18 DIAGNOSIS — I5042 Chronic combined systolic (congestive) and diastolic (congestive) heart failure: Secondary | ICD-10-CM | POA: Diagnosis not present

## 2020-09-18 DIAGNOSIS — I2510 Atherosclerotic heart disease of native coronary artery without angina pectoris: Secondary | ICD-10-CM | POA: Insufficient documentation

## 2020-09-18 DIAGNOSIS — I428 Other cardiomyopathies: Secondary | ICD-10-CM | POA: Insufficient documentation

## 2020-09-18 DIAGNOSIS — Z7984 Long term (current) use of oral hypoglycemic drugs: Secondary | ICD-10-CM | POA: Insufficient documentation

## 2020-09-18 DIAGNOSIS — G4733 Obstructive sleep apnea (adult) (pediatric): Secondary | ICD-10-CM | POA: Insufficient documentation

## 2020-09-18 DIAGNOSIS — I4891 Unspecified atrial fibrillation: Secondary | ICD-10-CM

## 2020-09-18 DIAGNOSIS — G5603 Carpal tunnel syndrome, bilateral upper limbs: Secondary | ICD-10-CM | POA: Diagnosis not present

## 2020-09-18 DIAGNOSIS — Z79899 Other long term (current) drug therapy: Secondary | ICD-10-CM | POA: Diagnosis not present

## 2020-09-18 DIAGNOSIS — R0602 Shortness of breath: Secondary | ICD-10-CM | POA: Insufficient documentation

## 2020-09-18 DIAGNOSIS — Z6841 Body Mass Index (BMI) 40.0 and over, adult: Secondary | ICD-10-CM | POA: Insufficient documentation

## 2020-09-18 DIAGNOSIS — M25569 Pain in unspecified knee: Secondary | ICD-10-CM | POA: Diagnosis not present

## 2020-09-18 LAB — BASIC METABOLIC PANEL
Anion gap: 5 (ref 5–15)
BUN: 18 mg/dL (ref 8–23)
CO2: 30 mmol/L (ref 22–32)
Calcium: 9 mg/dL (ref 8.9–10.3)
Chloride: 104 mmol/L (ref 98–111)
Creatinine, Ser: 1.46 mg/dL — ABNORMAL HIGH (ref 0.44–1.00)
GFR, Estimated: 40 mL/min — ABNORMAL LOW (ref >60)
Glucose, Bld: 104 mg/dL — ABNORMAL HIGH (ref 70–99)
Potassium: 3.5 mmol/L (ref 3.5–5.1)
Sodium: 139 mmol/L (ref 135–145)

## 2020-09-18 MED ORDER — TORSEMIDE 20 MG PO TABS: 60.0000 mg | ORAL_TABLET | Freq: Every day | ORAL | 3 refills | Status: DC

## 2020-09-18 NOTE — Patient Instructions (Signed)
Labs done today. We will contact you only if your labs are abnormal.  TAKE AN EXTRA 20MG  (1 TABLET) OF TORSEMIDE FOR A WEIGHT GAIN OF 3 POUNDS OR MORE IN 24 HOURS  No other medication changes were made. Please continue all current medications as prescribed.  Your physician recommends that you schedule a follow-up appointment in: 3 months with Dr. Aundra Dubin   If you have any questions or concerns before your next appointment please send [place] a message through [facility] or call our office at [phone].    TO LEAVE A MESSAGE FOR THE NURSE SELECT OPTION 2, PLEASE LEAVE A MESSAGE INCLUDING: . YOUR NAME . DATE OF BIRTH . CALL BACK NUMBER . REASON FOR CALL**this is important as we prioritize the call backs  YOU WILL RECEIVE A CALL BACK THE SAME DAY AS LONG AS YOU CALL BEFORE 4:00 PM   Do the following things EVERYDAY: 1) Weigh yourself in the morning before breakfast. Write it down and keep it in a log. 2) Take your medicines as prescribed 3) Eat low salt foods--Limit salt (sodium) to 2000 mg per day.  4) Stay as active as you can everyday 5) Limit all fluids for the day to less than 2 liters   At the [facility], you and your health needs are our priority. As part of our continuing mission to provide you with exceptional heart care, we have created designated Provider Care Teams. These Care Teams include your primary Cardiologist (physician) and Advanced Practice Providers (APPs- Physician Assistants and Nurse Practitioners) who all work together to provide you with the care you need, when you need it.   You may see any of the following providers on your designated Care Team at your next follow up: [name] Dr Glori Bickers . Dr [name] . Darrick Grinder, NP . Lyda Jester, PA . Audry Riles, PharmD   Please be sure to bring in all your medications bottles to every appointment.

## 2020-09-18 NOTE — Telephone Encounter (Signed)
Patient Advocate Encounter   Received notification from [facility] that prior authorization for Vyndamax is required.   PA submitted on CoverMyMeds Key [id number] Status is pending   Will continue to follow.

## 2020-09-19 ENCOUNTER — Telehealth (HOSPITAL_COMMUNITY): Payer: Self-pay | Admitting: Pharmacist

## 2020-09-19 ENCOUNTER — Other Ambulatory Visit (HOSPITAL_COMMUNITY): Payer: Self-pay

## 2020-09-19 MED ORDER — VYNDAMAX 61 MG PO CAPS
61.0000 mg | ORAL_CAPSULE | Freq: Every day | ORAL | 11 refills | Status: DC
  Filled 2020-09-19 – 2020-09-24 (×2): qty 30, 30d supply, fill #0

## 2020-09-19 NOTE — Telephone Encounter (Signed)
Received message that patient will be restarted on Vyndamax. Sent new Vyndamax prescription to [facility].   Audry Riles, PharmD, BCPS, BCCP, CPP Heart Failure Clinic Pharmacist [phone]

## 2020-09-19 NOTE — Telephone Encounter (Signed)
Advanced Heart Failure Patient Advocate Encounter  Prior Authorization for Kimberly [name] has been approved.    PA# [id number] Effective dates: 09/18/20 through 05/18/21  Patients co-pay is $9.85 (insurance only allows 30 day billing for this medication, as it is a specialty medication).  Was able to get the patient a [employer] grant to cover the cost of Vyndamax. The patient has been enrolled into the [employer] program. They will call her once a month to check medication adherence and set up shipment of the medication.   Member ID: [id number] Group ID: [id number] RxBin: [id number] PCN: [id number] Dates of Eligibility: 09/18/2020 through 08/18/2021  Fund:  [employer]

## 2020-09-24 ENCOUNTER — Other Ambulatory Visit (HOSPITAL_COMMUNITY): Payer: Self-pay

## 2020-09-24 ENCOUNTER — Telehealth (HOSPITAL_COMMUNITY): Payer: Self-pay | Admitting: Pharmacy Technician

## 2020-09-24 NOTE — Telephone Encounter (Signed)
Spoke with patient and set up shipping Vyndamax through the [employer]. The medication should be received by Wednesday 05/11.  Advised the patient to call with any issues.  Charlann Boxer, CPhT

## 2020-10-01 ENCOUNTER — Other Ambulatory Visit (HOSPITAL_COMMUNITY): Payer: Self-pay

## 2020-10-01 ENCOUNTER — Telehealth (HOSPITAL_COMMUNITY): Payer: Self-pay | Admitting: *Deleted

## 2020-10-01 NOTE — Telephone Encounter (Signed)
Have her try to take it with apple sauce.  We do not have a good alternative for her and she should continue it.

## 2020-10-01 NOTE — Telephone Encounter (Signed)
Check with pharmacy to see if there is a smaller form.

## 2020-10-01 NOTE — Telephone Encounter (Signed)
Pt called stating she cant swallow the vyndamax pill. Per vyndamax insert VYNDAMAX capsules should be swallowed whole and never crushed or cut. Pt asked if there is another medication she can take or any advice.  Routed to [place]

## 2020-10-01 NOTE — Telephone Encounter (Signed)
Yes, I am ok with her using the apple sauce if it allows her to continue to use it.

## 2020-10-04 NOTE — Telephone Encounter (Signed)
Spoke with pt about taking vyndamax with apple sauce. Pt said she did not want to try that because apple sauce and medications make her sick. At this time pt wishes to d/c vydamax.

## 2020-10-07 ENCOUNTER — Other Ambulatory Visit (HOSPITAL_COMMUNITY): Payer: Self-pay | Admitting: Adult Health

## 2020-10-16 ENCOUNTER — Other Ambulatory Visit (HOSPITAL_COMMUNITY): Payer: Self-pay

## 2020-10-19 ENCOUNTER — Other Ambulatory Visit (HOSPITAL_COMMUNITY): Payer: Self-pay

## 2020-10-22 ENCOUNTER — Telehealth (HOSPITAL_COMMUNITY): Payer: Self-pay | Admitting: Pharmacy Technician

## 2020-10-22 ENCOUNTER — Other Ambulatory Visit (HOSPITAL_COMMUNITY): Payer: Self-pay

## 2020-10-22 NOTE — Telephone Encounter (Signed)
Advanced Heart Failure Patient Advocate Encounter  The specialty pharmacy reached out to say that the patient would not like to take the Vyndamax with applesauce. Called and spoke with the patient, she stated that the Vyndamax is too big to take. Stated that the [facility] was easier for her to swallow. She would be willing to go back to taking the Vyndaqel.   Received notification from [facility] that prior authorization for Vyndaqel is required.   PA submitted on CoverMyMeds Key [id number] Status is pending   Will continue to follow.

## 2020-10-23 ENCOUNTER — Other Ambulatory Visit (HOSPITAL_COMMUNITY): Payer: Self-pay

## 2020-10-23 MED ORDER — VYNDAQEL 20 MG PO CAPS
80.0000 mg | ORAL_CAPSULE | Freq: Every day | ORAL | 11 refills | Status: DC
  Filled 2020-10-23: qty 120, 30d supply, fill #0
  Filled 2020-11-20: qty 120, 30d supply, fill #1
  Filled 2020-12-13: qty 120, 30d supply, fill #2
  Filled 2021-01-14: qty 120, 30d supply, fill #3
  Filled 2021-02-04: qty 120, 30d supply, fill #4
  Filled 2021-03-04: qty 120, 30d supply, fill #5
  Filled 2021-04-01: qty 120, 30d supply, fill #6
  Filled 2021-05-02: qty 120, 30d supply, fill #7
  Filled 2021-05-30: qty 120, 30d supply, fill #8
  Filled 2021-07-22: qty 120, 30d supply, fill #9
  Filled 2021-08-12: qty 120, 30d supply, fill #10
  Filled 2021-09-05: qty 120, 30d supply, fill #11

## 2020-10-23 NOTE — Telephone Encounter (Signed)
Advanced Heart Failure Patient Advocate Encounter  Prior Authorization for Vyndaqel has been approved.    PA# [id number] Effective dates: 10/22/20 through 05/18/21  Patients co-pay is $7.25 (Patient also has [employer], co-pay after grant applied, $0.)  Spoke to the patient who was agreeable to taking the Vyndaqel again. She is aware that the RX will be sent from [place] at [facility] and they will call her monthly to set up refills.

## 2020-10-24 ENCOUNTER — Other Ambulatory Visit (HOSPITAL_COMMUNITY): Payer: Self-pay

## 2020-10-25 ENCOUNTER — Other Ambulatory Visit (HOSPITAL_COMMUNITY): Payer: Self-pay

## 2020-10-26 ENCOUNTER — Other Ambulatory Visit (HOSPITAL_COMMUNITY): Payer: Self-pay | Admitting: Cardiology

## 2020-11-14 ENCOUNTER — Other Ambulatory Visit (HOSPITAL_COMMUNITY): Payer: Self-pay

## 2020-11-15 ENCOUNTER — Other Ambulatory Visit (HOSPITAL_COMMUNITY): Payer: Self-pay

## 2020-11-22 ENCOUNTER — Other Ambulatory Visit (HOSPITAL_COMMUNITY): Payer: Self-pay

## 2020-12-09 ENCOUNTER — Other Ambulatory Visit (HOSPITAL_COMMUNITY): Payer: Self-pay | Admitting: Family Medicine

## 2020-12-13 ENCOUNTER — Other Ambulatory Visit (HOSPITAL_COMMUNITY): Payer: Self-pay

## 2020-12-17 ENCOUNTER — Other Ambulatory Visit: Payer: Self-pay

## 2020-12-17 ENCOUNTER — Encounter (HOSPITAL_COMMUNITY): Payer: Self-pay | Admitting: Cardiology

## 2020-12-17 ENCOUNTER — Ambulatory Visit (HOSPITAL_COMMUNITY)
Admission: RE | Admit: 2020-12-17 | Discharge: 2020-12-17 | Disposition: A | Discharge status: Home / Self Care | Payer: Medicare HMO | Source: Ambulatory Visit | Attending: Cardiology | Admitting: Cardiology

## 2020-12-17 ENCOUNTER — Other Ambulatory Visit (HOSPITAL_COMMUNITY): Payer: Self-pay

## 2020-12-17 VITALS — BP 120/78 | HR 69 | Wt 370.0 lb

## 2020-12-17 DIAGNOSIS — Z888 Allergy status to other drugs, medicaments and biological substances status: Secondary | ICD-10-CM | POA: Diagnosis not present

## 2020-12-17 DIAGNOSIS — H9319 Tinnitus, unspecified ear: Secondary | ICD-10-CM | POA: Insufficient documentation

## 2020-12-17 DIAGNOSIS — I2510 Atherosclerotic heart disease of native coronary artery without angina pectoris: Secondary | ICD-10-CM | POA: Insufficient documentation

## 2020-12-17 DIAGNOSIS — I43 Cardiomyopathy in diseases classified elsewhere: Secondary | ICD-10-CM | POA: Diagnosis not present

## 2020-12-17 DIAGNOSIS — Z6841 Body Mass Index (BMI) 40.0 and over, adult: Secondary | ICD-10-CM | POA: Diagnosis not present

## 2020-12-17 DIAGNOSIS — I5042 Chronic combined systolic (congestive) and diastolic (congestive) heart failure: Secondary | ICD-10-CM | POA: Diagnosis present

## 2020-12-17 DIAGNOSIS — E854 Organ-limited amyloidosis: Secondary | ICD-10-CM | POA: Diagnosis not present

## 2020-12-17 DIAGNOSIS — I5022 Chronic systolic (congestive) heart failure: Secondary | ICD-10-CM

## 2020-12-17 DIAGNOSIS — Z7901 Long term (current) use of anticoagulants: Secondary | ICD-10-CM | POA: Insufficient documentation

## 2020-12-17 DIAGNOSIS — Z79899 Other long term (current) drug therapy: Secondary | ICD-10-CM | POA: Diagnosis not present

## 2020-12-17 DIAGNOSIS — G5603 Carpal tunnel syndrome, bilateral upper limbs: Secondary | ICD-10-CM | POA: Diagnosis not present

## 2020-12-17 DIAGNOSIS — I4821 Permanent atrial fibrillation: Secondary | ICD-10-CM | POA: Diagnosis not present

## 2020-12-17 DIAGNOSIS — G4733 Obstructive sleep apnea (adult) (pediatric): Secondary | ICD-10-CM | POA: Diagnosis not present

## 2020-12-17 DIAGNOSIS — Z7984 Long term (current) use of oral hypoglycemic drugs: Secondary | ICD-10-CM | POA: Insufficient documentation

## 2020-12-17 DIAGNOSIS — Z885 Allergy status to narcotic agent status: Secondary | ICD-10-CM | POA: Insufficient documentation

## 2020-12-17 DIAGNOSIS — Z8249 Family history of ischemic heart disease and other diseases of the circulatory system: Secondary | ICD-10-CM | POA: Insufficient documentation

## 2020-12-17 DIAGNOSIS — I428 Other cardiomyopathies: Secondary | ICD-10-CM | POA: Diagnosis present

## 2020-12-17 DIAGNOSIS — I4891 Unspecified atrial fibrillation: Secondary | ICD-10-CM | POA: Diagnosis not present

## 2020-12-17 DIAGNOSIS — I110 Hypertensive heart disease with heart failure: Secondary | ICD-10-CM | POA: Diagnosis present

## 2020-12-17 LAB — BASIC METABOLIC PANEL
Anion gap: 9 (ref 5–15)
BUN: 26 mg/dL — ABNORMAL HIGH (ref 8–23)
CO2: 31 mmol/L (ref 22–32)
Calcium: 9.4 mg/dL (ref 8.9–10.3)
Chloride: 100 mmol/L (ref 98–111)
Creatinine, Ser: 1.46 mg/dL — ABNORMAL HIGH (ref 0.44–1.00)
GFR, Estimated: 40 mL/min — ABNORMAL LOW (ref >60)
Glucose, Bld: 89 mg/dL (ref 70–99)
Potassium: 3.4 mmol/L — ABNORMAL LOW (ref 3.5–5.1)
Sodium: 140 mmol/L (ref 135–145)

## 2020-12-17 LAB — BRAIN NATRIURETIC PEPTIDE: B Natriuretic Peptide: 218.4 pg/mL — ABNORMAL HIGH (ref 0.0–100.0)

## 2020-12-17 MED ORDER — TORSEMIDE 20 MG PO TABS: ORAL_TABLET | ORAL | 3 refills | Status: DC

## 2020-12-17 MED ORDER — SEMAGLUTIDE 3 MG PO TABS: 3.0000 mg | ORAL_TABLET | Freq: Every day | ORAL | 11 refills | Status: DC

## 2020-12-17 NOTE — Progress Notes (Signed)
Date:  12/17/2020   ID:  Kimberly [name], DOB May 12, 1956, MRN [id number]   Provider location: [place] [facility] Type of Visit: Established patient   PCP:  Windell Hummingbird, PA-C  Cardiologist:  Dr. Aundra Dubin   History of Present Illness: Kimberly [name] is a 65 y.o. female who has a history of paroxysmal atrial fibrillation, obesity, and nonischemic cardiomyopathy.  She was admitted in 3/15 at [facility] with atrial fibrillation and CHF.  TEE was done in preparation for DCCV, showing EF 25-30% but there was LA thrombus so DCCV was not done.  Plan was for anticoagulation x 3 months followed by TEE-guided DCCV in the OR (due to patient's body habitus).  LHC was also done, showing mild nonobstructive CAD.  Patient was diuresed with IV Lasix in the hospital and started on apixaban.    Patient had TEE again in 6/15, showing EF 35-40% with diffuse hypokinesis and no LAA thrombus.  She was cardioverted to NSR (with difficulty).  I started her at that time on amiodarone.  She then went back into atrial fibrillation.  After she had been loaded on amiodarone, cardioverted her again in 7/15 to NSR.     She had gone back into atrial fibrillation in 1/18 and amiodarone was increased to bid in preparation for DCCV. However, she spontaneously converted to NSR.   PYP scan in 4/19 was grade 3, strongly suggestive of TTR amyloidosis.  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  Echo 8/19 showed EF 60%, moderate LVH, mild aortic stenosis.  Of note, she has carpal tunnel syndrome.  She was started on tafamidis.    She went back into atrial fibrillation and had DCCV in 2/20 but was back in atrial fibrillation a few weeks later when she went to atrial fibrillation clinic.  Dr. Rayann Heman thought that morbid obesity precluded atrial fibrillation ablation and concerns about compliance led them not to start her on Tikosyn.  Instead, it was recommended that she start on CPAP for her OSA,  lose weight, and re-attempt DCCV while on amiodarone.   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  In 7/21, she developed hematochezia and had EGD/colonoscopy which were unrevealing except for internal hemorrhoids.   She returns today for followup of diastolic CHF, cardiac amyloidosis, and atrial fibrillation.  She is now in permanent atrial fibrillation. Weight is down 11 lbs.  She continues to have burning in her feet and hands concerning for peripheral neuropathy.  She is only taking 40 mg daily torsemide.  She says that she cannot take 60 mg all at once.  She has orthopnea, has slept at 45 degrees for a long time.  She is short of breath walking about 100-200 feet.  Generally can get around stores but sometimes uses a scooter if it is available.  No lightheadedness.  She does not feel palpitations.   ECG (personally reviewed): atrial fibrillation, rate 73   Labs (4/19): LFTs normal, K 4, creatinine 1.24, TSH normal, immunofixation normal Labs (8/19): TSH normal, K 3.8, creatinine 1.27, LFTs normal, TSH normal Labs (11/19): K 3.8, creatinine 1.21 Labs (2/20): LDL 114, hgb 10.5 Labs (3/20): K 4.4, creatinine 1.42 Labs (4/20): K 4.2, creatinine 1.23 Labs (7/21): K 3.8, creatinine 1.23 Labs (8/21): hgb 11.2 Labs (10/21): creatinine 2.1 Labs (5/22): K 3.5, creatinine 1.46   PMH: 1. Obese 2. Atrial fibrillation: Paroxysmal => chronic.  Unable to cardiovert in 3/15 due to LAA thrombus noted on TEE. TEE in 6/15 without LAA thrombus.  Patient had cardioversion to NSR with difficulty but back in atrial fibrillation by 7/15 appt.  She was started on amiodarone and cardioverted in 7/15.   - DCCV in 2/20 but back in atrial fibrillation by 3/20, has been in atrial fibrillation since that time.  3. H/o TKR 4. Nonischemic cardiomyopathy: TEE (3/15) with EF 25-30%, moderate MR, PA systolic pressure 51 mmHg, moderate to  severe TR, LA thrombus was noted.   LHC (3/15) with 50% stenosis small PLV.  TEE (6/15) with EF 35-40%, diffuse hypokinesis, mild MR, moderate TR, +PFO, no LA appendage thrombus.  - Echo (10/15) with EF 60-65%.   - ECHO 06/10/2016 EF 60-65% Mild AS - Echo (8/19): EF 60%, moderate LVH, mild aortic stenosis.  - PYP scan in 4/19 was grade 3, strongly suggestive of transthyretin amyloidosis.  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  - Echo (11/21): EF 55-60%, mild LV dilation, RV normal, mild-moderate AS with mean gradient 17 mmHg and AVA 1.07 cm^2.  5. CAD: Nonobstructive.  LHC (3/15) with 50% stenosis small PLV. 6. PFO 7. Left Breast mass: Was taken for lumpectomy in 1/18 but mass had resolved.  8. Aortic stenosis: Mild-moderate on 11/21 echo.  9. Carpal tunnel syndrome.  10. Vestibular schwannoma s/p gamma knife surgery at [facility].  11. OSA: Not currently using CPAP.  12. Hematochezia: C-scope/EGD in 7/21 showed only internal hemorrhoids.  13. 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    Current Outpatient Medications  Medication Sig Dispense Refill   acetaminophen (TYLENOL) 650 MG CR tablet Take 1,300 mg by mouth every 8 (eight) hours as needed for pain.      Cholecalciferol (VITAMIN D-3) 5000 UNITS TABS Take 5,000 Units by mouth daily.     Docusate Sodium (DSS) 100 MG CAPS Take by mouth.     ELIQUIS 5 MG TABS tablet TAKE 1 TABLET TWICE DAILY 180 tablet 3   empagliflozin (JARDIANCE) 10 MG TABS tablet Take 1 tablet (10 mg total) by mouth daily before breakfast. 90 tablet 3   Ferrous Sulfate 27 MG TABS Take 27 mg by mouth 2 (two) times daily.      fluticasone furoate-vilanterol (BREO ELLIPTA) 100-25 MCG/INH AEPB      ketoconazole (NIZORAL) 2 % cream      metoprolol succinate (TOPROL-XL) 100 MG 24 hr tablet TAKE 1 TABLET TWICE DAILY 180 tablet 3   nitroGLYCERIN (NITROSTAT) 0.4 MG SL tablet Place 1 tablet (0.4 mg total) under the tongue every 5 (five) minutes as needed for chest pain.  30 tablet 1   Omega-3 1000 MG CAPS Take by mouth.     potassium chloride (KLOR-CON) 10 MEQ tablet Take 4 tablets (40 mEq total) by mouth daily. 360 tablet 3   RESTASIS 0.05 % ophthalmic emulsion Place 1 drop into both eyes daily as needed.     rosuvastatin (CRESTOR) 40 MG tablet Take 1 tablet (40 mg total) by mouth at bedtime. 90 tablet 3   Semaglutide 3 MG TABS Take 3 mg by mouth daily at 6 (six) AM. 30 tablet 11   Tafamidis Meglumine, Cardiac, (VYNDAQEL) 20 MG CAPS Take 4 capsules (80 mg)  by mouth daily. 120 capsule 11   torsemide (DEMADEX) 20 MG tablet Take 2 tablets (40 mg total) by mouth every morning AND 1 tablet (20 mg total) every evening. 270 tablet 3   No current facility-administered medications for this encounter.  Allergies:   Tramadol and Spironolactone   Social History:  The patient  reports that she has never smoked. She has never used smokeless tobacco. She reports that she does not drink alcohol and does not use drugs.   Family History:  The patient's family history includes CVA in her mother.   ROS:  Please see the history of present illness.   All other systems are personally reviewed and negative.   Exam:   BP 120/78   Pulse 69   Wt (!) 167.8 kg (370 lb)   SpO2 95%   BMI 59.72 kg/m  General: NAD Neck: JVP 9=10 cm, no thyromegaly or thyroid nodule.  Lungs: Clear to auscultation bilaterally with normal respiratory effort. CV: Nondisplaced PMI.  Heart irregular S1/S2, no S3/S4, 2/6 SEM RUSB.  Trace ankle edema.  No carotid bruit.  Normal pedal pulses.  Abdomen: Soft, nontender, no hepatosplenomegaly, no distention.  Skin: Intact without lesions or rashes.  Neurologic: Alert and oriented x 3.  Psych: Normal affect. Extremities: No clubbing or cyanosis.  HEENT: Normal.   Recent Labs: 03/01/2020: ALT 14; TSH 1.404 05/31/2020: Hemoglobin 12.0; Platelets 152 12/17/2020: B Natriuretic Peptide 218.4; BUN 26; Creatinine, Ser 1.46; Potassium 3.4; Sodium 140   Personally reviewed   Wt Readings from Last 3 Encounters:  12/17/20 (!) 167.8 kg (370 lb)  09/18/20 (!) 173 kg (381 lb 6.4 oz)  07/16/20 (!) 174.4 kg (384 lb 6.4 oz)     ASSESSMENT AND PLAN:  1. 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  EF 35-40% on TEE in 6/15 but then EF back to 60-65% by 10/15 and remained normal on last echo in 8/19.  LHC without significant coronary disease in 3/15.  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  She appears volume overloaded by exam again today, NYHA class III symptoms though her weight is down.     - I will have her increase torsemide to 40 qam/20 qpm (she says that she cannot take 60 mg all at once).  BMET/BNP today and BMET in 10 days.  - Continue Toprol XL 100 mg BID - Continue empagliflozin 10 mg daily . - Echo at followup in 2 months.   2. 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  Atrial fibrillation and carpal tunnel syndrome, both of which she has, go along with amyloidosis.  Her history of orthostatic symptoms as well as symptoms in toes and feet consistent with peripheral neuropathy are also likely related to amyloidosis.  - Continue tafamidis.   - Given hereditary TTR amyloidosis, I will see if I can get her started on patisiran as well to help with her peripheral neuropathy. She needs to see Dr. Posey Pronto with neurology to document peripheral neuropathy, I will refer.  - I will refer to Dr. 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   2. Atrial fibrillation: Last DCCV in 2/20 but back in atrial fibrillation since then.  Dr. Rayann Heman saw, decided against atrial fibrillation ablation due to morbid obesity.  EP decided against Tikosyn due to compliance concerns. She failed  amiodarone.  She remains in atrial fibrillation, I think that the atrial fibrillation is likely permanent at this point.   - Continue Toprol XL 100 mg bid.  - Continue Eliquis. 3. CAD: Mild, nonobstructive by cath 07/2013. No chest pain.  - No ASA  given Eliquis use.   - Continue crestor 40 mg daily.  4. Bilateral carpal tunnel symptoms: Likely related to amyloidosis.  5. OSA: She says that she cannot tolerate CPAP due to tinnitus.  6. HTN: Continue amlodipine, BP controlled.  7. Aortic stenosis: Mild to moderate AS on echo in 11/21.   - She will get repeat echo in 11/22 to follow.  8. Obesity: Will see if we can get her semaglutide for weight loss.   Recommended follow-up:  2 months with echo.   Signed, [name], MD  12/17/2020  [place] [address] [facility] [place] [address] [phone] (office) [phone] (fax)

## 2020-12-17 NOTE — Patient Instructions (Addendum)
EKG done today.   Labs done today. We will contact you only if your labs are abnormal.  START Semaglutide '3mg'$ (1 tablet) by mouth daily.   No other medication changes were made. Please continue all current medications as prescribed.  You have been referred to Genetics. They will contact you to schedule an appointment.  You have been referred to Neurology. They will contact you to schedule an appointment.   Your physician recommends that you schedule a follow-up appointment in: 10 days for a lab only appointment and in 2 months with Dr. Aundra Dubin with an echo prior to your exam.  Your physician has requested that you have an echocardiogram. Echocardiography is a painless test that uses sound waves to create images of your heart. It provides your doctor with information about the size and shape of your heart and how well your heart's chambers and valves are working. This procedure takes approximately one hour. There are no restrictions for this procedure.  If you have any questions or concerns before your next appointment please send [place] a message through [place] or call our office at [phone].    TO LEAVE A MESSAGE FOR THE NURSE SELECT OPTION 2, PLEASE LEAVE A MESSAGE INCLUDING: YOUR NAME DATE OF BIRTH CALL BACK NUMBER REASON FOR CALL**this is important as we prioritize the call backs  YOU WILL RECEIVE A CALL BACK THE SAME DAY AS LONG AS YOU CALL BEFORE 4:00 PM   Do the following things EVERYDAY: Weigh yourself in the morning before breakfast. Write it down and keep it in a log. Take your medicines as prescribed Eat low salt foods--Limit salt (sodium) to 2000 mg per day.  Stay as active as you can everyday Limit all fluids for the day to less than 2 liters   At the [facility], you and your health needs are our priority. As part of our continuing mission to provide you with exceptional heart care, we have created designated Provider Care Teams. These Care Teams  include your primary Cardiologist (physician) and Advanced Practice Providers (APPs- Physician Assistants and Nurse Practitioners) who all work together to provide you with the care you need, when you need it.   You may see any of the following providers on your designated Care Team at your next follow up: Dr Glori Bickers Dr Haynes Kerns, NP Lyda Jester, [place] Audry Riles, PharmD   Please be sure to bring in all your medications bottles to every appointment.

## 2020-12-18 ENCOUNTER — Telehealth (HOSPITAL_COMMUNITY): Payer: Self-pay

## 2020-12-18 MED ORDER — POTASSIUM CHLORIDE ER 10 MEQ PO TBCR: 60.0000 meq | EXTENDED_RELEASE_TABLET | Freq: Every day | ORAL | 3 refills | Status: DC

## 2020-12-18 NOTE — Telephone Encounter (Signed)
-----   Message from [name], MD sent at 12/17/2020  2:17 PM EDT ----- Increase total daily KCl by 20 mEq. BMET 10 days.

## 2020-12-18 NOTE — Telephone Encounter (Signed)
Spoke with pt and pt is aware to increase potassium to 20 meq daily and tp keep lab appointment for 08//03/2021

## 2020-12-19 ENCOUNTER — Other Ambulatory Visit (HOSPITAL_COMMUNITY): Payer: Self-pay

## 2020-12-20 ENCOUNTER — Other Ambulatory Visit (HOSPITAL_COMMUNITY): Payer: Self-pay

## 2020-12-27 ENCOUNTER — Ambulatory Visit (HOSPITAL_COMMUNITY)
Admission: RE | Admit: 2020-12-27 | Discharge: 2020-12-27 | Disposition: A | Discharge status: Home / Self Care | Payer: Medicare HMO | Source: Ambulatory Visit | Attending: Internal Medicine | Admitting: Internal Medicine

## 2020-12-27 ENCOUNTER — Other Ambulatory Visit: Payer: Self-pay

## 2020-12-27 DIAGNOSIS — I5022 Chronic systolic (congestive) heart failure: Secondary | ICD-10-CM | POA: Diagnosis present

## 2020-12-27 LAB — BASIC METABOLIC PANEL
Anion gap: 11 (ref 5–15)
BUN: 19 mg/dL (ref 8–23)
CO2: 30 mmol/L (ref 22–32)
Calcium: 9.5 mg/dL (ref 8.9–10.3)
Chloride: 98 mmol/L (ref 98–111)
Creatinine, Ser: 1.4 mg/dL — ABNORMAL HIGH (ref 0.44–1.00)
GFR, Estimated: 42 mL/min — ABNORMAL LOW (ref >60)
Glucose, Bld: 95 mg/dL (ref 70–99)
Potassium: 3.4 mmol/L — ABNORMAL LOW (ref 3.5–5.1)
Sodium: 139 mmol/L (ref 135–145)

## 2020-12-31 ENCOUNTER — Other Ambulatory Visit (HOSPITAL_COMMUNITY): Payer: Self-pay | Admitting: *Deleted

## 2020-12-31 MED ORDER — POTASSIUM CHLORIDE ER 10 MEQ PO TBCR: 40.0000 meq | EXTENDED_RELEASE_TABLET | Freq: Every day | ORAL | 3 refills | Status: DC

## 2021-01-07 ENCOUNTER — Other Ambulatory Visit (HOSPITAL_COMMUNITY): Payer: Self-pay

## 2021-01-10 ENCOUNTER — Other Ambulatory Visit: Payer: Self-pay

## 2021-01-10 ENCOUNTER — Ambulatory Visit (HOSPITAL_COMMUNITY)
Admission: RE | Admit: 2021-01-10 | Discharge: 2021-01-10 | Disposition: A | Discharge status: Home / Self Care | Payer: Medicare HMO | Source: Ambulatory Visit | Attending: Cardiology | Admitting: Cardiology

## 2021-01-10 DIAGNOSIS — I5022 Chronic systolic (congestive) heart failure: Secondary | ICD-10-CM | POA: Insufficient documentation

## 2021-01-10 LAB — BASIC METABOLIC PANEL
Anion gap: 10 (ref 5–15)
BUN: 15 mg/dL (ref 8–23)
CO2: 27 mmol/L (ref 22–32)
Calcium: 9.1 mg/dL (ref 8.9–10.3)
Chloride: 101 mmol/L (ref 98–111)
Creatinine, Ser: 1.26 mg/dL — ABNORMAL HIGH (ref 0.44–1.00)
GFR, Estimated: 48 mL/min — ABNORMAL LOW (ref >60)
Glucose, Bld: 87 mg/dL (ref 70–99)
Potassium: 3.4 mmol/L — ABNORMAL LOW (ref 3.5–5.1)
Sodium: 138 mmol/L (ref 135–145)

## 2021-01-14 ENCOUNTER — Other Ambulatory Visit (HOSPITAL_COMMUNITY): Payer: Self-pay

## 2021-01-15 ENCOUNTER — Other Ambulatory Visit (HOSPITAL_COMMUNITY): Payer: Self-pay

## 2021-01-16 ENCOUNTER — Other Ambulatory Visit (HOSPITAL_COMMUNITY): Payer: Self-pay

## 2021-02-04 ENCOUNTER — Other Ambulatory Visit (HOSPITAL_COMMUNITY): Payer: Self-pay

## 2021-02-07 ENCOUNTER — Other Ambulatory Visit (HOSPITAL_COMMUNITY): Payer: Self-pay

## 2021-02-25 ENCOUNTER — Other Ambulatory Visit: Payer: Self-pay

## 2021-02-25 ENCOUNTER — Ambulatory Visit (HOSPITAL_BASED_OUTPATIENT_CLINIC_OR_DEPARTMENT_OTHER)
Admission: RE | Admit: 2021-02-25 | Discharge: 2021-02-25 | Disposition: A | Discharge status: Home / Self Care | Payer: Medicare HMO | Source: Ambulatory Visit | Attending: Cardiology | Admitting: Cardiology

## 2021-02-25 ENCOUNTER — Ambulatory Visit (HOSPITAL_COMMUNITY)
Admission: RE | Admit: 2021-02-25 | Discharge: 2021-02-25 | Disposition: A | Discharge status: Home / Self Care | Payer: Medicare HMO | Source: Ambulatory Visit | Attending: Cardiology | Admitting: Cardiology

## 2021-02-25 VITALS — BP 120/72 | HR 72 | Ht 66.0 in | Wt 361.8 lb

## 2021-02-25 DIAGNOSIS — G5603 Carpal tunnel syndrome, bilateral upper limbs: Secondary | ICD-10-CM | POA: Diagnosis not present

## 2021-02-25 DIAGNOSIS — E854 Organ-limited amyloidosis: Secondary | ICD-10-CM

## 2021-02-25 DIAGNOSIS — I110 Hypertensive heart disease with heart failure: Secondary | ICD-10-CM | POA: Diagnosis not present

## 2021-02-25 DIAGNOSIS — E785 Hyperlipidemia, unspecified: Secondary | ICD-10-CM | POA: Insufficient documentation

## 2021-02-25 DIAGNOSIS — I4821 Permanent atrial fibrillation: Secondary | ICD-10-CM | POA: Diagnosis not present

## 2021-02-25 DIAGNOSIS — I428 Other cardiomyopathies: Secondary | ICD-10-CM | POA: Insufficient documentation

## 2021-02-25 DIAGNOSIS — G4733 Obstructive sleep apnea (adult) (pediatric): Secondary | ICD-10-CM | POA: Diagnosis not present

## 2021-02-25 DIAGNOSIS — Z7951 Long term (current) use of inhaled steroids: Secondary | ICD-10-CM | POA: Insufficient documentation

## 2021-02-25 DIAGNOSIS — Z79899 Other long term (current) drug therapy: Secondary | ICD-10-CM | POA: Diagnosis not present

## 2021-02-25 DIAGNOSIS — I5022 Chronic systolic (congestive) heart failure: Secondary | ICD-10-CM

## 2021-02-25 DIAGNOSIS — I2510 Atherosclerotic heart disease of native coronary artery without angina pectoris: Secondary | ICD-10-CM | POA: Diagnosis not present

## 2021-02-25 DIAGNOSIS — Z888 Allergy status to other drugs, medicaments and biological substances status: Secondary | ICD-10-CM | POA: Insufficient documentation

## 2021-02-25 DIAGNOSIS — I43 Cardiomyopathy in diseases classified elsewhere: Secondary | ICD-10-CM | POA: Diagnosis not present

## 2021-02-25 DIAGNOSIS — E119 Type 2 diabetes mellitus without complications: Secondary | ICD-10-CM | POA: Diagnosis not present

## 2021-02-25 DIAGNOSIS — Z7901 Long term (current) use of anticoagulants: Secondary | ICD-10-CM | POA: Insufficient documentation

## 2021-02-25 DIAGNOSIS — R0600 Dyspnea, unspecified: Secondary | ICD-10-CM | POA: Diagnosis not present

## 2021-02-25 DIAGNOSIS — I5042 Chronic combined systolic (congestive) and diastolic (congestive) heart failure: Secondary | ICD-10-CM | POA: Diagnosis not present

## 2021-02-25 DIAGNOSIS — R0602 Shortness of breath: Secondary | ICD-10-CM | POA: Insufficient documentation

## 2021-02-25 DIAGNOSIS — I350 Nonrheumatic aortic (valve) stenosis: Secondary | ICD-10-CM | POA: Insufficient documentation

## 2021-02-25 DIAGNOSIS — Z6841 Body Mass Index (BMI) 40.0 and over, adult: Secondary | ICD-10-CM | POA: Diagnosis not present

## 2021-02-25 DIAGNOSIS — Z7984 Long term (current) use of oral hypoglycemic drugs: Secondary | ICD-10-CM | POA: Insufficient documentation

## 2021-02-25 LAB — BASIC METABOLIC PANEL
Anion gap: 10 (ref 5–15)
BUN: 25 mg/dL — ABNORMAL HIGH (ref 8–23)
CO2: 30 mmol/L (ref 22–32)
Calcium: 9.9 mg/dL (ref 8.9–10.3)
Chloride: 102 mmol/L (ref 98–111)
Creatinine, Ser: 1.33 mg/dL — ABNORMAL HIGH (ref 0.44–1.00)
GFR, Estimated: 45 mL/min — ABNORMAL LOW (ref >60)
Glucose, Bld: 104 mg/dL — ABNORMAL HIGH (ref 70–99)
Potassium: 4.2 mmol/L (ref 3.5–5.1)
Sodium: 142 mmol/L (ref 135–145)

## 2021-02-25 LAB — ECHOCARDIOGRAM COMPLETE
AR max vel: 1.56 cm2
AV Area VTI: 1.46 cm2
AV Area mean vel: 1.48 cm2
AV Mean grad: 19.8 mmHg
AV Peak grad: 32.7 mmHg
Ao pk vel: 2.86 m/s
Calc EF: 47 %
P 1/2 time: 672 msec
S' Lateral: 3.4 cm
Single Plane A2C EF: 45.6 %
Single Plane A4C EF: 49.7 %

## 2021-02-25 LAB — CBC
HCT: 41.1 % (ref 36.0–46.0)
Hemoglobin: 12.6 g/dL (ref 12.0–15.0)
MCH: 27.8 pg (ref 26.0–34.0)
MCHC: 30.7 g/dL (ref 30.0–36.0)
MCV: 90.7 fL (ref 80.0–100.0)
Platelets: 225 10*3/uL (ref 150–400)
RBC: 4.53 MIL/uL (ref 3.87–5.11)
RDW: 16.1 % — ABNORMAL HIGH (ref 11.5–15.5)
WBC: 3.8 10*3/uL — ABNORMAL LOW (ref 4.0–10.5)
nRBC: 0 % (ref 0.0–0.2)

## 2021-02-25 NOTE — Progress Notes (Signed)
  Echocardiogram 2D Echocardiogram has been performed.  Kimberly [name] 02/25/2021, 11:14 AM

## 2021-02-25 NOTE — Patient Instructions (Addendum)
Labs done today. We will contact you only if your labs are abnormal.  No medication changes were made. Please continue all current medications as prescribed.  You have been referred to Neurology(Dr. Posey Pronto). Her office will contact you to schedule an appointment. You must see them in order to start [place] have been referred to the [facility] for [facility]. That office will contact you to schedule an appointment.  You have been referred to [facility]. They will contact you to schedule an appointment.   Your physician recommends that you schedule a follow-up appointment in: 3 months with our [facility] Clinic here in our office.   If you have any questions or concerns before your next appointment please send [place] a message through [place] or call our office at [phone].    TO LEAVE A MESSAGE FOR THE NURSE SELECT OPTION 2, PLEASE LEAVE A MESSAGE INCLUDING: YOUR NAME DATE OF BIRTH CALL BACK NUMBER REASON FOR CALL**this is important as we prioritize the call backs  YOU WILL RECEIVE A CALL BACK THE SAME DAY AS LONG AS YOU CALL BEFORE 4:00 PM   Do the following things EVERYDAY: Weigh yourself in the morning before breakfast. Write it down and keep it in a log. Take your medicines as prescribed Eat low salt foods--Limit salt (sodium) to 2000 mg per day.  Stay as active as you can everyday Limit all fluids for the day to less than 2 liters   At the [facility], you and your health needs are our priority. As part of our continuing mission to provide you with exceptional heart care, we have created designated Provider Care Teams. These Care Teams include your primary Cardiologist (physician) and Advanced Practice Providers (APPs- Physician Assistants and Nurse Practitioners) who all work together to provide you with the care you need, when you need it.   You may see any of the following providers on your designated Care Team at your next follow up: Dr Glori Bickers Dr Haynes Kerns, NP Lyda Jester, [place] Audry Riles, PharmD   Please be sure to bring in all your medications bottles to every appointment.

## 2021-02-25 NOTE — Progress Notes (Signed)
Date:  02/25/2021   ID:  Kimberly [name], DOB 30-Apr-1956, MRN [id number]   Provider location: [place] [facility] Type of Visit: Established patient   PCP:  Windell Hummingbird, PA-C  Cardiologist:  Dr. Aundra Dubin   History of Present Illness: Kimberly [name] is a 65 y.o. female who has a history of paroxysmal atrial fibrillation, obesity, and nonischemic cardiomyopathy.  She was admitted in 3/15 at [facility] with atrial fibrillation and CHF.  TEE was done in preparation for DCCV, showing EF 25-30% but there was LA thrombus so DCCV was not done.  Plan was for anticoagulation x 3 months followed by TEE-guided DCCV in the OR (due to patient's body habitus).  LHC was also done, showing mild nonobstructive CAD.  Patient was diuresed with IV Lasix in the hospital and started on apixaban.    Patient had TEE again in 6/15, showing EF 35-40% with diffuse hypokinesis and no LAA thrombus.  She was cardioverted to NSR (with difficulty).  I started her at that time on amiodarone.  She then went back into atrial fibrillation.  After she had been loaded on amiodarone, cardioverted her again in 7/15 to NSR.     She had gone back into atrial fibrillation in 1/18 and amiodarone was increased to bid in preparation for DCCV. However, she spontaneously converted to NSR.   PYP scan in 4/19 was grade 3, strongly suggestive of TTR amyloidosis.  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  Echo 8/19 showed EF 60%, moderate LVH, mild aortic stenosis.  Of note, she has carpal tunnel syndrome.  She was started on tafamidis.    She went back into atrial fibrillation and had DCCV in 2/20 but was back in atrial fibrillation a few weeks later when she went to atrial fibrillation clinic.  Dr. Rayann Heman thought that morbid obesity precluded atrial fibrillation ablation and concerns about compliance led them not to start her on Tikosyn.  Instead, it was recommended that she start on CPAP for her  OSA, lose weight, and re-attempt DCCV while on amiodarone.   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  In 7/21, she developed hematochezia and had EGD/colonoscopy which were unrevealing except for internal hemorrhoids.   Echo was done today and reviewed, EF 60-65%, normal RV, moderate AS mean gradient 20 mmHg with AVA 1.56 cm^2, mild AI.   She returns today for followup of diastolic CHF, cardiac amyloidosis, and atrial fibrillation.  She is now in permanent atrial fibrillation. Weight is down 9 lbs.  She continues to have burning in her feet and hands concerning for peripheral neuropathy, this is unchanged.  She has been walking further lately with less shortness of breath.  Doing ok walking on flat ground. Dyspnea with hills, stairs. No chest pain.  No lightheadedness.    Labs (4/19): LFTs normal, K 4, creatinine 1.24, TSH normal, immunofixation normal Labs (8/19): TSH normal, K 3.8, creatinine 1.27, LFTs normal, TSH normal Labs (11/19): K 3.8, creatinine 1.21 Labs (2/20): LDL 114, hgb 10.5 Labs (3/20): K 4.4, creatinine 1.42 Labs (4/20): K 4.2, creatinine 1.23 Labs (7/21): K 3.8, creatinine 1.23 Labs (8/21): hgb 11.2 Labs (10/21): creatinine 2.1 Labs (5/22): K 3.5, creatinine 1.46 Labs (8/22): K 3.4, creatinine 1.26   PMH: 1. Obese 2. Atrial fibrillation: Paroxysmal => chronic.  Unable to cardiovert in 3/15 due to LAA thrombus noted on TEE. TEE  in 6/15 without LAA thrombus.  Patient had cardioversion to NSR with difficulty but back in atrial fibrillation by 7/15 appt.  She was started on amiodarone and cardioverted in 7/15.   - DCCV in 2/20 but back in atrial fibrillation by 3/20, has been in atrial fibrillation since that time.  3. H/o TKR 4. Nonischemic cardiomyopathy: TEE (3/15) with EF 25-30%, moderate MR, PA systolic pressure 51 mmHg, moderate to severe TR, LA thrombus was noted.   LHC (3/15) with 50%  stenosis small PLV.  TEE (6/15) with EF 35-40%, diffuse hypokinesis, mild MR, moderate TR, +PFO, no LA appendage thrombus.  - Echo (10/15) with EF 60-65%.   - ECHO 06/10/2016 EF 60-65% Mild AS - Echo (8/19): EF 60%, moderate LVH, mild aortic stenosis.  - PYP scan in 4/19 was grade 3, strongly suggestive of transthyretin amyloidosis.  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  - Echo (11/21): EF 55-60%, mild LV dilation, RV normal, mild-moderate AS with mean gradient 17 mmHg and AVA 1.07 cm^2.  - Echo (10/22): EF 60-65%, normal RV, moderate AS mean gradient 20 mmHg with AVA 1.56 cm^2, mild AI.  5. CAD: Nonobstructive.  LHC (3/15) with 50% stenosis small PLV. 6. PFO 7. Left Breast mass: Was taken for lumpectomy in 1/18 but mass had resolved.  8. Aortic stenosis: Moderate on 10/22 echo.  9. Carpal tunnel syndrome.  10. Vestibular schwannoma s/p gamma knife surgery at [facility].  11. OSA: Not currently using CPAP.  12. Hematochezia: C-scope/EGD in 7/21 showed only internal hemorrhoids.  13. 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    Current Outpatient Medications  Medication Sig Dispense Refill   acetaminophen (TYLENOL) 650 MG CR tablet Take 1,300 mg by mouth every 8 (eight) hours as needed for pain.      Cholecalciferol (VITAMIN D-3) 5000 UNITS TABS Take 5,000 Units by mouth daily.     Docusate Sodium (DSS) 100 MG CAPS Take by mouth as needed.     ELIQUIS 5 MG TABS tablet TAKE 1 TABLET TWICE DAILY 180 tablet 3   empagliflozin (JARDIANCE) 10 MG TABS tablet Take 1 tablet (10 mg total) by mouth daily before breakfast. 90 tablet 3   Ferrous Sulfate 27 MG TABS Take 27 mg by mouth 2 (two) times daily.      fluticasone furoate-vilanterol (BREO ELLIPTA) 100-25 MCG/INH AEPB Inhale 1 puff into the lungs every morning.     ketoconazole (NIZORAL) 2 % cream Apply 1 application topically as needed.     metoprolol succinate (TOPROL-XL) 100 MG 24 hr tablet TAKE 1 TABLET TWICE DAILY 180 tablet 3    nitroGLYCERIN (NITROSTAT) 0.4 MG SL tablet Place 1 tablet (0.4 mg total) under the tongue every 5 (five) minutes as needed for chest pain. 30 tablet 1   Omega-3 1000 MG CAPS Take 1,000 mg by mouth every morning.     Potassium 99 MG TABS Take 2 tablets by mouth in the morning and at bedtime.     RESTASIS 0.05 % ophthalmic emulsion Place 1 drop into both eyes daily as needed.     rosuvastatin (CRESTOR) 40 MG tablet Take 1 tablet (40 mg total) by mouth at bedtime. 90 tablet 3   Tafamidis Meglumine, Cardiac, (VYNDAQEL) 20 MG CAPS Take 4 capsules (80 mg)  by mouth daily. 120 capsule 11   torsemide (DEMADEX) 20 MG tablet Take 2 tablets (40 mg total) by mouth every morning AND 1 tablet (20 mg total) every evening. 270 tablet 3   potassium chloride (  KLOR-CON) 10 MEQ tablet Take 4 tablets (40 mEq total) by mouth daily. (Patient not taking: Reported on 02/25/2021) 120 tablet 3   No current facility-administered medications for this encounter.    Allergies:   Tramadol and Spironolactone   Social History:  The patient  reports that she has never smoked. She has never used smokeless tobacco. She reports that she does not drink alcohol and does not use drugs.   Family History:  The patient's family history includes CVA in her mother.   ROS:  Please see the history of present illness.   All other systems are personally reviewed and negative.   Exam:   BP 120/72   Pulse 72   Ht 5\' 6"  (1.676 m)   Wt (!) 164.1 kg (361 lb 12.8 oz)   SpO2 99%   BMI 58.40 kg/m  General: NAD Neck: Thick, no JVD, no thyromegaly or thyroid nodule.  Lungs: Clear to auscultation bilaterally with normal respiratory effort. CV: Nondisplaced PMI.  Heart regular S1/S2, no S3/S4, 2/6 early SEM RUSB.  No peripheral edema.  No carotid bruit.  Normal pedal pulses.  Abdomen: Soft, nontender, no hepatosplenomegaly, no distention.  Skin: Intact without lesions or rashes.  Neurologic: Alert and oriented x 3.  Psych: Normal  affect. Extremities: No clubbing or cyanosis.  HEENT: Normal.   Recent Labs: 03/01/2020: ALT 14; TSH 1.404 12/17/2020: B Natriuretic Peptide 218.4 02/25/2021: BUN 25; Creatinine, Ser 1.33; Hemoglobin 12.6; Platelets 225; Potassium 4.2; Sodium 142  Personally reviewed   Wt Readings from Last 3 Encounters:  02/25/21 (!) 164.1 kg (361 lb 12.8 oz)  12/17/20 (!) 167.8 kg (370 lb)  09/18/20 (!) 173 kg (381 lb 6.4 oz)     ASSESSMENT AND PLAN:  1. 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  EF 35-40% on TEE in 6/15 but then EF back to 60-65% by 10/15 and remained normal on echo in 8/19 and in 10/22.  LHC without significant coronary disease in 3/15.  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  She is not volume overloaded on exam, NYHA class II symptoms.     - Continue torsemide 40 qam/20 qpm.  BMET/BNP today.  - Continue Toprol XL 100 mg BID - Continue empagliflozin 10 mg daily . 2. 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  Atrial fibrillation and carpal tunnel syndrome, both of which she has, go along with amyloidosis.  Her history of orthostatic symptoms as well as symptoms in toes and feet consistent with peripheral neuropathy are also likely related to amyloidosis.  - Continue tafamidis.   - Given hereditary TTR amyloidosis, I will see if I can get her started on patisiran as well to help with her peripheral neuropathy. She needs to see Dr. Posey Pronto with neurology to document peripheral neuropathy.  She still has not had this appointment, I will again refer.  - I will refer to Dr. Broadus John for genetic counseling given Val142Ile mutation, she still has not had this appointment.   2. Atrial fibrillation: Last DCCV in 2/20 but back in atrial  fibrillation since then.  Dr. Rayann Heman saw, decided against atrial fibrillation ablation due to morbid obesity.  EP decided against Tikosyn due to compliance concerns. She failed amiodarone.  She remains in atrial fibrillation, I think that the atrial fibrillation is likely permanent at this point.   -  Continue Toprol XL 100 mg bid.  - Continue Eliquis.  CBC today.  3. CAD: Mild, nonobstructive by cath 07/2013. No chest pain.  - No ASA given Eliquis use.   - Continue crestor 40 mg daily. Check lipids today.  4. Bilateral carpal tunnel symptoms: Likely related to amyloidosis.  5. OSA: She says that she cannot tolerate CPAP due to tinnitus.  6. HTN: Continue amlodipine, BP controlled.  7. Aortic stenosis: Moderate AS on today's echo.  8. Obesity: Will see if we can get her semaglutide for weight loss.  I will refer her to pharmacy clinic for this.   Recommended follow-up:  3 months with APP.   Signed, [name], MD  02/25/2021  [place] [address] [facility] [place] [address] [phone] (office) [phone] (fax)

## 2021-02-26 LAB — LIPID PANEL
Cholesterol: 244 mg/dL — ABNORMAL HIGH (ref 0–200)
HDL: 63 mg/dL (ref >40)
LDL Cholesterol: 165 mg/dL — ABNORMAL HIGH (ref 0–99)
Total CHOL/HDL Ratio: 3.9 RATIO
Triglycerides: 78 mg/dL (ref <150)
VLDL: 16 mg/dL (ref 0–40)

## 2021-02-28 ENCOUNTER — Ambulatory Visit: Payer: Medicare HMO

## 2021-02-28 NOTE — Progress Notes (Deleted)
Patient ID: Kimberly [name]                 DOB: [birth year]                    MRN: [id number]     HPI: Kimberly [name] is a 65 y.o. female patient referred to pharmacy clinic by Dr Aundra Dubin to initiate weight loss therapy with GLP1-RA. PMH is significant for obesity complicated by chronic medical conditions including permanent afib, NICMP with EF as low as 25-30% in 2015 improved to 66% in 10/22, hereditary amyloidosis based on PYP scan and genetic testing currently on tafamidis, aortic stenosis, mild bilateral ICA stenosis on 10/20 carotid doppler, OSA, vertigo and tinnitus secondary to vestibular schwannoma tx with gamma knife surgery at [facility]. Most recent BMI 58, previously as high as 63 in Feb 2022.   Weight today   [employer] gold plus - ozempic tier 3 45/1 month or 125/3 months no PA needed, no wegovy (medicare)  Also on eliquis, jardiance, tafamidis (vyndaqel) - copays for those? Need pt assistance? Looks like vyndamax was 9.85 - LIS?  A1c previously 6.5% in 2015, most recently 6% 03/2020.  Tell pt to restart crestor 40 daily - mclean rn lm for pt (LDL up to 165 on the 10th)  Current weight management medications:   Previously tried meds:  Current meds that may affect weight: none  Baseline weight/BMI:  Insurance payor:   Diet:  -Breakfast: -Lunch: -Dinner: -Snacks: -Drinks:  Exercise:   Family History: CVA in her mother.   Social History: The patient  reports that she has never smoked. She has never used smokeless tobacco. She reports that she does not drink alcohol and does not use drugs.   Labs: Lab Results  Component Value Date   HGBA1C 6.0 (H) 03/27/2020    Wt Readings from Last 1 Encounters:  02/25/21 (!) 361 lb 12.8 oz (164.1 kg)    BP Readings from Last 1 Encounters:  02/25/21 120/72   Pulse Readings from Last 1 Encounters:  02/25/21 72       Component Value Date/Time   CHOL 244 (H) 02/25/2021 1155   TRIG 78 02/25/2021 1155   HDL 63 02/25/2021 1155    CHOLHDL 3.9 02/25/2021 1155   VLDL 16 02/25/2021 1155   LDLCALC 165 (H) 02/25/2021 1155    Past Medical History:  Diagnosis Date   Aortic stenosis    Arthritis    KNEES   Atrial fibrillation (HCC)    Complication of anesthesia    Diabetes (HCC)    HLD (hyperlipidemia)    Left atrial thrombus    Morbid obesity (HCC)    NICM (nonischemic cardiomyopathy) (HCC)    EF 30%   PONV (postoperative nausea and vomiting)    Unilateral vestibular schwannoma ([place]) 08/29/2019   left    Current Outpatient Medications on File Prior to Visit  Medication Sig Dispense Refill   acetaminophen (TYLENOL) 650 MG CR tablet Take 1,300 mg by mouth every 8 (eight) hours as needed for pain.      Cholecalciferol (VITAMIN D-3) 5000 UNITS TABS Take 5,000 Units by mouth daily.     Docusate Sodium (DSS) 100 MG CAPS Take by mouth as needed.     ELIQUIS 5 MG TABS tablet TAKE 1 TABLET TWICE DAILY 180 tablet 3   empagliflozin (JARDIANCE) 10 MG TABS tablet Take 1 tablet (10 mg total) by mouth daily before breakfast. 90 tablet 3   Ferrous Sulfate 27 MG TABS  Take 27 mg by mouth 2 (two) times daily.      fluticasone furoate-vilanterol (BREO ELLIPTA) 100-25 MCG/INH AEPB Inhale 1 puff into the lungs every morning.     ketoconazole (NIZORAL) 2 % cream Apply 1 application topically as needed.     metoprolol succinate (TOPROL-XL) 100 MG 24 hr tablet TAKE 1 TABLET TWICE DAILY 180 tablet 3   nitroGLYCERIN (NITROSTAT) 0.4 MG SL tablet Place 1 tablet (0.4 mg total) under the tongue every 5 (five) minutes as needed for chest pain. 30 tablet 1   Omega-3 1000 MG CAPS Take 1,000 mg by mouth every morning.     Potassium 99 MG TABS Take 2 tablets by mouth in the morning and at bedtime.     potassium chloride (KLOR-CON) 10 MEQ tablet Take 4 tablets (40 mEq total) by mouth daily. (Patient not taking: Reported on 02/25/2021) 120 tablet 3   RESTASIS 0.05 % ophthalmic emulsion Place 1 drop into both eyes daily as needed.     rosuvastatin  (CRESTOR) 40 MG tablet Take 1 tablet (40 mg total) by mouth at bedtime. 90 tablet 3   Tafamidis Meglumine, Cardiac, (VYNDAQEL) 20 MG CAPS Take 4 capsules (80 mg)  by mouth daily. 120 capsule 11   torsemide (DEMADEX) 20 MG tablet Take 2 tablets (40 mg total) by mouth every morning AND 1 tablet (20 mg total) every evening. 270 tablet 3   No current facility-administered medications on file prior to visit.    Allergies  Allergen Reactions   Tramadol Nausea And Vomiting   Spironolactone Itching     Assessment/Plan:  1. Weight loss - Patient has not met goal of at least 5% of body weight loss with comprehensive lifestyle modifications alone in the past 3-6 months. Pharmacotherapy is appropriate to pursue as augmentation. Will start ***. Confirmed patient not ***pregnant and no personal or family history of medullary thyroid carcinoma (MTC) or Multiple Endocrine Neoplasia syndrome type 2 (MEN 2).   Advised patient on common side effects including nausea, diarrhea, dyspepsia, decreased appetite, and fatigue. Counseled patient on reducing meal size and how to titrate medication to minimize side effects. Counseled patient to call if intolerable side effects or if experiencing dehydration, abdominal pain, or dizziness. Patient will adhere to dietary modifications and will target at least 150 minutes of moderate intensity exercise weekly.   Follow up in ***.

## 2021-03-01 ENCOUNTER — Telehealth (HOSPITAL_COMMUNITY): Payer: Self-pay | Admitting: Cardiology

## 2021-03-01 DIAGNOSIS — E785 Hyperlipidemia, unspecified: Secondary | ICD-10-CM

## 2021-03-01 NOTE — Telephone Encounter (Signed)
-----   Message from [name], MD sent at 03/01/2021 12:07 AM EDT ----- If she is really taking the Crestor, refer to lipid clinic for [place].

## 2021-03-04 ENCOUNTER — Other Ambulatory Visit (HOSPITAL_COMMUNITY): Payer: Self-pay

## 2021-03-06 ENCOUNTER — Other Ambulatory Visit (HOSPITAL_COMMUNITY): Payer: Self-pay

## 2021-03-27 ENCOUNTER — Ambulatory Visit: Payer: Medicare HMO

## 2021-04-01 ENCOUNTER — Other Ambulatory Visit (HOSPITAL_COMMUNITY): Payer: Self-pay

## 2021-04-08 ENCOUNTER — Ambulatory Visit: Payer: Medicare HMO

## 2021-04-08 ENCOUNTER — Other Ambulatory Visit (HOSPITAL_COMMUNITY): Payer: Self-pay

## 2021-04-08 DIAGNOSIS — I10 Essential (primary) hypertension: Secondary | ICD-10-CM | POA: Insufficient documentation

## 2021-04-08 DIAGNOSIS — M1990 Unspecified osteoarthritis, unspecified site: Secondary | ICD-10-CM | POA: Insufficient documentation

## 2021-04-08 DIAGNOSIS — R112 Nausea with vomiting, unspecified: Secondary | ICD-10-CM | POA: Insufficient documentation

## 2021-04-08 DIAGNOSIS — R2681 Unsteadiness on feet: Secondary | ICD-10-CM | POA: Insufficient documentation

## 2021-05-02 ENCOUNTER — Other Ambulatory Visit (HOSPITAL_COMMUNITY): Payer: Self-pay

## 2021-05-02 ENCOUNTER — Ambulatory Visit: Payer: Medicare HMO

## 2021-05-06 ENCOUNTER — Other Ambulatory Visit (HOSPITAL_COMMUNITY): Payer: Self-pay

## 2021-05-14 ENCOUNTER — Telehealth (HOSPITAL_COMMUNITY): Payer: Self-pay | Admitting: Pharmacist

## 2021-05-14 NOTE — Telephone Encounter (Signed)
Advanced Heart Failure Patient Advocate Encounter  Prior Authorization for Vyndaqel has been approved.     Effective dates: 05/19/21 through 05/18/22  Audry Riles, PharmD, BCPS, BCCP, CPP Heart Failure Clinic Pharmacist [phone]

## 2021-05-14 NOTE — Telephone Encounter (Signed)
Patient Advocate Encounter   Received notification from [facility] that prior authorization for Vyndaqel is required.   PA submitted on CoverMyMeds Key [id number] Status is pending   Will continue to follow.  Audry Riles, PharmD, BCPS, BCCP, CPP Heart Failure Clinic Pharmacist [phone]

## 2021-05-16 ENCOUNTER — Ambulatory Visit: Payer: Medicare HMO

## 2021-05-27 NOTE — Progress Notes (Signed)
Date:  05/28/2021   ID:  Kimberly [name], DOB [birth year], MRN [id number]   Provider location: [place] [facility] Type of Visit: Established patient   PCP:  Windell Hummingbird, PA-C  Cardiologist:  Dr. Aundra Dubin   History of Present Illness: Kimberly [name] is a 66 y.o. female who has a history of paroxysmal atrial fibrillation, obesity, and nonischemic cardiomyopathy.  She was admitted in 3/15 at [facility] with atrial fibrillation and CHF.  TEE was done in preparation for DCCV, showing EF 25-30% but there was LA thrombus so DCCV was not done.  Plan was for anticoagulation x 3 months followed by TEE-guided DCCV in the OR (due to patient's body habitus).  LHC was also done, showing mild nonobstructive CAD.  Patient was diuresed with IV Lasix in the hospital and started on apixaban.    Patient had TEE again in 6/15, showing EF 35-40% with diffuse hypokinesis and no LAA thrombus.  She was cardioverted to NSR (with difficulty).  I started her at that time on amiodarone.  She then went back into atrial fibrillation.  After she had been loaded on amiodarone, cardioverted her again in 7/15 to NSR.     She had gone back into atrial fibrillation in 1/18 and amiodarone was increased to bid in preparation for DCCV. However, she spontaneously converted to NSR.   PYP scan in 4/19 was grade 3, strongly suggestive of TTR amyloidosis.  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  Echo 8/19 showed EF 60%, moderate LVH, mild aortic stenosis.  Of note, she has carpal tunnel syndrome.  She was started on tafamidis.    She went back into atrial fibrillation and had DCCV in 2/20 but was back in atrial fibrillation a few weeks later when she went to atrial fibrillation clinic.  Dr. Rayann Heman thought that morbid obesity precluded atrial fibrillation ablation and concerns about compliance led them not to start her on Tikosyn.  Instead, it was recommended that she start on CPAP for her OSA,  lose weight, and re-attempt DCCV while on amiodarone.   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  In 7/21, she developed hematochezia and had EGD/colonoscopy which were unrevealing except for internal hemorrhoids.   Echo 10/22 EF 60-65%, normal RV, moderate AS mean gradient 20 mmHg with AVA 1.56 cm^2, mild AI.   Today she returns for HF follow up. Overall feeling fine. She is less short of breath now walking in stores or on flat ground after restarting tafamadis. She continues to struggle with tinnitus and peripheral neuropathy, no syncope or falls. Denies CP, abnormal bleeding, palpitations, edema, or PND/Orthopnea. Appetite ok. No fever or chills.  Taking all medications. She gets nauseated when she takes extra torsemide in the evening, so she is now taking torsemide 40 mg daily.  Labs (4/19): LFTs normal, K 4, creatinine 1.24, TSH normal, immunofixation normal Labs (8/19): TSH normal, K 3.8, creatinine 1.27, LFTs normal, TSH normal Labs (11/19): K 3.8, creatinine 1.21 Labs (2/20): LDL 114, hgb 10.5 Labs (3/20): K 4.4, creatinine 1.42 Labs (4/20): K 4.2, creatinine 1.23 Labs (7/21): K 3.8, creatinine 1.23 Labs (8/21): hgb 11.2 Labs (10/21): creatinine 2.1 Labs (5/22): K 3.5, creatinine 1.46 Labs (8/22): K 3.4, creatinine 1.26 Labs (10/22):  K 4.2, creatinine 1.33, hgb 12.6, LDL 165   PMH: 1. Obese 2. Atrial fibrillation: Paroxysmal => chronic.  Unable to cardiovert  in 3/15 due to LAA thrombus noted on TEE. TEE in 6/15 without LAA thrombus.  Patient had cardioversion to NSR with difficulty but back in atrial fibrillation by 7/15 appt.  She was started on amiodarone and cardioverted in 7/15.   - DCCV in 2/20 but back in atrial fibrillation by 3/20, has been in atrial fibrillation since that time.  3. H/o TKR 4. Nonischemic cardiomyopathy: TEE (3/15) with EF 25-30%, moderate MR, PA systolic pressure 51 mmHg,  moderate to severe TR, LA thrombus was noted.   LHC (3/15) with 50% stenosis small PLV.  TEE (6/15) with EF 35-40%, diffuse hypokinesis, mild MR, moderate TR, +PFO, no LA appendage thrombus.  - Echo (10/15) with EF 60-65%.   - ECHO 06/10/2016 EF 60-65% Mild AS - Echo (8/19): EF 60%, moderate LVH, mild aortic stenosis.  - PYP scan in 4/19 was grade 3, strongly suggestive of transthyretin amyloidosis.  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  - Echo (11/21): EF 55-60%, mild LV dilation, RV normal, mild-moderate AS with mean gradient 17 mmHg and AVA 1.07 cm^2.  - Echo (10/22): EF 60-65%, normal RV, moderate AS mean gradient 20 mmHg with AVA 1.56 cm^2, mild AI.  5. CAD: Nonobstructive.  LHC (3/15) with 50% stenosis small PLV. 6. PFO 7. Left Breast mass: Was taken for lumpectomy in 1/18 but mass had resolved.  8. Aortic stenosis: Moderate on 10/22 echo.  9. Carpal tunnel syndrome.  10. Vestibular schwannoma s/p gamma knife surgery at [facility].  11. OSA: Not currently using CPAP.  12. Hematochezia: C-scope/EGD in 7/21 showed only internal hemorrhoids.  13. 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    Current Outpatient Medications  Medication Sig Dispense Refill   acetaminophen (TYLENOL) 650 MG CR tablet Take 1,300 mg by mouth every 8 (eight) hours as needed for pain.      Cholecalciferol (VITAMIN D-3) 5000 UNITS TABS Take 5,000 Units by mouth daily.     Docusate Sodium (DSS) 100 MG CAPS Take by mouth as needed.     ELIQUIS 5 MG TABS tablet TAKE 1 TABLET TWICE DAILY 180 tablet 3   empagliflozin (JARDIANCE) 10 MG TABS tablet Take 1 tablet (10 mg total) by mouth daily before breakfast. 90 tablet 3   Ferrous Sulfate 27 MG TABS Take 27 mg by mouth 2 (two) times daily.      fluticasone furoate-vilanterol (BREO ELLIPTA) 100-25 MCG/INH AEPB Inhale 1 puff into the lungs every morning. As needed     ketoconazole (NIZORAL) 2 % cream Apply 1 application topically as needed.     metoprolol succinate  (TOPROL-XL) 100 MG 24 hr tablet TAKE 1 TABLET TWICE DAILY 180 tablet 3   nitroGLYCERIN (NITROSTAT) 0.4 MG SL tablet Place 1 tablet (0.4 mg total) under the tongue every 5 (five) minutes as needed for chest pain. 30 tablet 1   Omega-3 1000 MG CAPS Take 1,000 mg by mouth every morning.     Potassium 99 MG TABS Take 2 tablets by mouth in the morning and at bedtime.     potassium chloride (KLOR-CON) 10 MEQ tablet Take 4 tablets (40 mEq total) by mouth daily. 120 tablet 3   RESTASIS 0.05 % ophthalmic emulsion Place 1 drop into both eyes daily as needed.     rosuvastatin (CRESTOR) 40 MG tablet Take 1 tablet (40 mg total) by mouth at bedtime. 90 tablet 3   Tafamidis Meglumine, Cardiac, (VYNDAQEL) 20 MG CAPS Take 4 capsules (80 mg)  by mouth daily. 120 capsule 11  torsemide (DEMADEX) 20 MG tablet Patient takes 2 tablets in the morning.     No current facility-administered medications for this encounter.    Allergies:   Tramadol and Spironolactone   Social History:  The patient  reports that she has never smoked. She has never used smokeless tobacco. She reports that she does not drink alcohol and does not use drugs.   Family History:  The patient's family history includes CVA in her mother.   ROS:  Please see the history of present illness.   All other systems are personally reviewed and negative.   Exam:   BP 98/68    Pulse 87    Wt (!) 160.7 kg (354 lb 3.2 oz)    SpO2 97%    BMI 57.17 kg/m  General:  NAD. No resp difficulty HEENT: Normal Neck: Supple. Thick neck Carotids 2+ bilat; no bruits. No lymphadenopathy or thryomegaly appreciated. Cor: PMI nondisplaced. Irregular rate & rhythm. No rubs, gallops, 2/6 SEM RUSB Lungs: Clear Abdomen: Obese, nontender, nondistended. No hepatosplenomegaly. No bruits or masses. Good bowel sounds. Extremities: No cyanosis, clubbing, rash, trace BLE edema, L>R Neuro: Alert & oriented x 3, cranial nerves grossly intact. Moves all 4 extremities w/o difficulty.  Affect pleasant.  Recent Labs: 12/17/2020: B Natriuretic Peptide 218.4 02/25/2021: BUN 25; Creatinine, Ser 1.33; Hemoglobin 12.6; Platelets 225; Potassium 4.2; Sodium 142  Personally reviewed   Wt Readings from Last 3 Encounters:  05/28/21 (!) 160.7 kg (354 lb 3.2 oz)  02/25/21 (!) 164.1 kg (361 lb 12.8 oz)  12/17/20 (!) 167.8 kg (370 lb)     ASSESSMENT AND PLAN:  1. 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  EF 35-40% on TEE in 6/15 but then EF back to 60-65% by 10/15 and remained normal on echo in 8/19 and in 10/22.  LHC without significant coronary disease in 3/15.  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  She is not volume overloaded on exam, NYHA class II symptoms.     - Continue torsemide 40 mg daily.  BMET/BNP today.  - Continue Toprol XL 100 mg bid. - Continue empagliflozin 10 mg daily. No GU symptoms. 2. 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  Atrial fibrillation and carpal tunnel syndrome, both of which she has, go along with amyloidosis.  Her history of orthostatic symptoms as well as symptoms in toes and feet consistent with peripheral neuropathy are also likely related to amyloidosis.  - Continue tafamidis.   - Given hereditary TTR amyloidosis, we will see if we can get her started on patisiran as well to help with her peripheral neuropathy. She was previously referred to Dr. Posey Pronto with neurology to document peripheral neuropathy. However she is established with Dr. Jannifer Franklin with [facility] and prefers to follow up, will arrange. - She has been referred to Dr. 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 She has this appt today. 3. Atrial fibrillation: Last DCCV in 2/20 but back in atrial fibrillation since then.   Dr. Rayann Heman saw, decided against atrial fibrillation ablation due to morbid obesity.  EP decided against Tikosyn due to compliance concerns. She failed amiodarone.  She remains in atrial fibrillation, I think that the atrial fibrillation is likely permanent at this point.   - Continue Toprol XL 100 mg bid.  - Continue Eliquis.  No bleeding issues. 4.  CAD: Mild, nonobstructive by cath 07/2013. No chest pain.  - No ASA given Eliquis use.   - Continue crestor 40 mg daily. Check lipids today.  5. Bilateral carpal tunnel symptoms: Likely related to amyloidosis.  6. OSA: She says that she cannot tolerate CPAP due to tinnitus.  7. HTN: BP on the low side today. Check on BP at home and monitor.  8. Aortic stenosis: Moderate AS on recent echo. 9. Obesity: Body mass index is 57.17 kg/m. - She has appt with pharmacy soon for semaglutide.  Recommended follow-up:  3 months with Dr. Aundra Dubin  Signed, Rafael Bihari, FNP  05/28/2021  [place] [address] [facility] and [place] [address] [phone] (office) [phone] (fax)

## 2021-05-28 ENCOUNTER — Ambulatory Visit (HOSPITAL_COMMUNITY)
Admission: RE | Admit: 2021-05-28 | Discharge: 2021-05-28 | Disposition: A | Discharge status: Home / Self Care | Payer: Medicare HMO | Source: Ambulatory Visit | Attending: Family Medicine | Admitting: Family Medicine

## 2021-05-28 ENCOUNTER — Other Ambulatory Visit: Payer: Self-pay

## 2021-05-28 ENCOUNTER — Encounter: Payer: Medicare HMO | Admitting: Genetic Counselor

## 2021-05-28 ENCOUNTER — Encounter (HOSPITAL_COMMUNITY): Payer: Self-pay

## 2021-05-28 VITALS — BP 98/68 | HR 87 | Wt 354.2 lb

## 2021-05-28 DIAGNOSIS — H9319 Tinnitus, unspecified ear: Secondary | ICD-10-CM | POA: Diagnosis not present

## 2021-05-28 DIAGNOSIS — G5603 Carpal tunnel syndrome, bilateral upper limbs: Secondary | ICD-10-CM | POA: Diagnosis not present

## 2021-05-28 DIAGNOSIS — I428 Other cardiomyopathies: Secondary | ICD-10-CM | POA: Insufficient documentation

## 2021-05-28 DIAGNOSIS — I5022 Chronic systolic (congestive) heart failure: Secondary | ICD-10-CM | POA: Diagnosis present

## 2021-05-28 DIAGNOSIS — I110 Hypertensive heart disease with heart failure: Secondary | ICD-10-CM | POA: Diagnosis not present

## 2021-05-28 DIAGNOSIS — I43 Cardiomyopathy in diseases classified elsewhere: Secondary | ICD-10-CM | POA: Insufficient documentation

## 2021-05-28 DIAGNOSIS — I350 Nonrheumatic aortic (valve) stenosis: Secondary | ICD-10-CM | POA: Diagnosis not present

## 2021-05-28 DIAGNOSIS — I480 Paroxysmal atrial fibrillation: Secondary | ICD-10-CM | POA: Insufficient documentation

## 2021-05-28 DIAGNOSIS — Z6841 Body Mass Index (BMI) 40.0 and over, adult: Secondary | ICD-10-CM | POA: Insufficient documentation

## 2021-05-28 DIAGNOSIS — I4891 Unspecified atrial fibrillation: Secondary | ICD-10-CM

## 2021-05-28 DIAGNOSIS — I10 Essential (primary) hypertension: Secondary | ICD-10-CM

## 2021-05-28 DIAGNOSIS — G4733 Obstructive sleep apnea (adult) (pediatric): Secondary | ICD-10-CM | POA: Diagnosis not present

## 2021-05-28 DIAGNOSIS — Z7901 Long term (current) use of anticoagulants: Secondary | ICD-10-CM | POA: Insufficient documentation

## 2021-05-28 DIAGNOSIS — G629 Polyneuropathy, unspecified: Secondary | ICD-10-CM | POA: Insufficient documentation

## 2021-05-28 DIAGNOSIS — E854 Organ-limited amyloidosis: Secondary | ICD-10-CM | POA: Insufficient documentation

## 2021-05-28 DIAGNOSIS — I2510 Atherosclerotic heart disease of native coronary artery without angina pectoris: Secondary | ICD-10-CM

## 2021-05-28 DIAGNOSIS — Z79899 Other long term (current) drug therapy: Secondary | ICD-10-CM | POA: Insufficient documentation

## 2021-05-28 LAB — BRAIN NATRIURETIC PEPTIDE: B Natriuretic Peptide: 126.9 pg/mL — ABNORMAL HIGH (ref 0.0–100.0)

## 2021-05-28 LAB — COMPREHENSIVE METABOLIC PANEL
ALT: 16 U/L (ref 0–44)
AST: 22 U/L (ref 15–41)
Albumin: 4.4 g/dL (ref 3.5–5.0)
Alkaline Phosphatase: 109 U/L (ref 38–126)
Anion gap: 10 (ref 5–15)
BUN: 21 mg/dL (ref 8–23)
CO2: 31 mmol/L (ref 22–32)
Calcium: 9.7 mg/dL (ref 8.9–10.3)
Chloride: 98 mmol/L (ref 98–111)
Creatinine, Ser: 1.4 mg/dL — ABNORMAL HIGH (ref 0.44–1.00)
GFR, Estimated: 42 mL/min — ABNORMAL LOW (ref >60)
Glucose, Bld: 86 mg/dL (ref 70–99)
Potassium: 4 mmol/L (ref 3.5–5.1)
Sodium: 139 mmol/L (ref 135–145)
Total Bilirubin: 1 mg/dL (ref 0.3–1.2)
Total Protein: 8.5 g/dL — ABNORMAL HIGH (ref 6.5–8.1)

## 2021-05-28 LAB — LIPID PANEL
Cholesterol: 192 mg/dL (ref 0–200)
HDL: 67 mg/dL (ref >40)
LDL Cholesterol: 112 mg/dL — ABNORMAL HIGH (ref 0–99)
Total CHOL/HDL Ratio: 2.9 RATIO
Triglycerides: 63 mg/dL (ref <150)
VLDL: 13 mg/dL (ref 0–40)

## 2021-05-28 NOTE — Patient Instructions (Signed)
Medication Changes:  No Change  Lab Work:  Labs done today, your results will be available in MyChart, we will contact you for abnormal readings.   Testing/Procedures:  none  Referrals:  Dr. Jannifer Franklin office [phone]. Call his office for appointment   Special Instructions // Education:  none  Follow-Up in: 3 months  At the [facility], you and your health needs are our priority. We have a designated team specialized in the treatment of Heart Failure. This Care Team includes your primary Heart Failure Specialized Cardiologist (physician), Advanced Practice Providers (APPs- Physician Assistants and Nurse Practitioners), and Pharmacist who all work together to provide you with the care you need, when you need it.   You may see any of the following providers on your designated Care Team at your next follow up:  Dr Glori Bickers Dr Haynes Kerns, NP Lyda Jester, [facility] [place], [place] Audry Riles, PharmD   Please be sure to bring in all your medications bottles to every appointment.   Need to Contact us:  If you have any questions or concerns before your next appointment please send [place] a message through [place] or call our office at [phone].    TO LEAVE A MESSAGE FOR THE NURSE SELECT OPTION 2, PLEASE LEAVE A MESSAGE INCLUDING: YOUR NAME DATE OF BIRTH CALL BACK NUMBER REASON FOR CALL**this is important as we prioritize the call backs  YOU WILL RECEIVE A CALL BACK THE SAME DAY AS LONG AS YOU CALL BEFORE 4:00 PM

## 2021-05-30 ENCOUNTER — Other Ambulatory Visit (HOSPITAL_COMMUNITY): Payer: Self-pay

## 2021-06-03 ENCOUNTER — Other Ambulatory Visit (HOSPITAL_COMMUNITY): Payer: Self-pay

## 2021-06-06 DIAGNOSIS — Z923 Personal history of irradiation: Secondary | ICD-10-CM | POA: Insufficient documentation

## 2021-06-07 ENCOUNTER — Other Ambulatory Visit (HOSPITAL_COMMUNITY): Payer: Self-pay | Admitting: Cardiology

## 2021-06-07 DIAGNOSIS — I4891 Unspecified atrial fibrillation: Secondary | ICD-10-CM

## 2021-06-11 ENCOUNTER — Other Ambulatory Visit: Payer: Self-pay

## 2021-06-11 ENCOUNTER — Other Ambulatory Visit (HOSPITAL_COMMUNITY): Payer: Self-pay | Admitting: Cardiology

## 2021-06-11 ENCOUNTER — Ambulatory Visit: Payer: Medicare HMO | Admitting: Pharmacist

## 2021-06-11 VITALS — BP 112/64 | HR 83 | Resp 18 | Ht 66.0 in | Wt 354.0 lb

## 2021-06-11 DIAGNOSIS — I10 Essential (primary) hypertension: Secondary | ICD-10-CM | POA: Diagnosis not present

## 2021-06-11 DIAGNOSIS — E669 Obesity, unspecified: Secondary | ICD-10-CM

## 2021-06-11 DIAGNOSIS — H9042 Sensorineural hearing loss, unilateral, left ear, with unrestricted hearing on the contralateral side: Secondary | ICD-10-CM | POA: Insufficient documentation

## 2021-06-11 DIAGNOSIS — E1169 Type 2 diabetes mellitus with other specified complication: Secondary | ICD-10-CM | POA: Diagnosis not present

## 2021-06-11 DIAGNOSIS — I4891 Unspecified atrial fibrillation: Secondary | ICD-10-CM

## 2021-06-11 DIAGNOSIS — H9313 Tinnitus, bilateral: Secondary | ICD-10-CM | POA: Insufficient documentation

## 2021-06-11 LAB — HEMOGLOBIN A1C
Est. average glucose Bld gHb Est-mCnc: 134 mg/dL
Hgb A1c MFr Bld: 6.3 % — ABNORMAL HIGH (ref 4.8–5.6)

## 2021-06-11 NOTE — Progress Notes (Signed)
Patient ID: Kimberly [name]                 DOB: 20-Dec-1955                    MRN: [id number]     HPI: Kimberly [name] is a 66 y.o. female patient referred to pharmacy clinic by Dr Aundra Dubin to initiate weight loss therapy with GLP1-RA. PMH is significant for obesity complicated by chronic medical conditions including CHF, A Fib, amyloidosis, ACS, DM, and obesity. Most recent BMI 57.14.  Patient presents today unclear what her appointment is scheduled for.  Explained it was a weigh loss consult and patient was interested.  In past year she has lost 30-40# and feels much better. She can now walk longer distances without use of a wheelchair, but she believes this is likely due to her CHF being better managed.  Now walks with a cane but has difficulty going from sitting to standing position.    Had previously been going to the pool at the [facility] but stopped because she said it was too crowded and she had trouble walking from the parking lot to the inside of the building.  Last A1c 2021: 6.0  Current weight management medications:  N/A  Previously tried meds: N/A  Current meds that may affect weight: Torsemide Jardiance  Baseline weight/BMI: 57.14  Insurance payor:  [employer]  Diet:  -Breakfast: rarely eats breakfast, -Lunch: bananas, grapes, oranges -Dinner: fried chicken, fish, greens, squash, cabbage -Snacks: chips, cookies -Drinks: water, soda, tea, orange juice, milk  Exercise: cant exercise   Labs: Lab Results  Component Value Date   HGBA1C 6.0 (H) 03/27/2020    Wt Readings from Last 1 Encounters:  06/11/21 (!) 354 lb (160.6 kg)    BP Readings from Last 1 Encounters:  06/11/21 112/64   Pulse Readings from Last 1 Encounters:  06/11/21 83       Component Value Date/Time   CHOL 192 05/28/2021 1537   TRIG 63 05/28/2021 1537   HDL 67 05/28/2021 1537   CHOLHDL 2.9 05/28/2021 1537   VLDL 13 05/28/2021 1537   LDLCALC 112 (H) 05/28/2021 1537    Past Medical  History:  Diagnosis Date   Aortic stenosis    Arthritis    KNEES   Atrial fibrillation (HCC)    Complication of anesthesia    Diabetes (HCC)    HLD (hyperlipidemia)    Left atrial thrombus    Morbid obesity (HCC)    NICM (nonischemic cardiomyopathy) (HCC)    EF 30%   PONV (postoperative nausea and vomiting)    Unilateral vestibular schwannoma ([place]) 08/29/2019   left    Current Outpatient Medications on File Prior to Visit  Medication Sig Dispense Refill   acetaminophen (TYLENOL) 650 MG CR tablet Take 1,300 mg by mouth every 8 (eight) hours as needed for pain.      benzonatate (TESSALON) 100 MG capsule Take 100 mg by mouth 3 (three) times daily as needed.     Cholecalciferol (VITAMIN D-3) 5000 UNITS TABS Take 5,000 Units by mouth daily.     Docusate Sodium (DSS) 100 MG CAPS Take by mouth as needed.     ELIQUIS 5 MG TABS tablet TAKE 1 TABLET (5 MG TOTAL) BY MOUTH 2 (TWO) TIMES DAILY. 60 tablet 5   empagliflozin (JARDIANCE) 10 MG TABS tablet Take 1 tablet (10 mg total) by mouth daily before breakfast. 90 tablet 3   Ferrous Sulfate 27 MG TABS Take  27 mg by mouth 2 (two) times daily.      fluticasone furoate-vilanterol (BREO ELLIPTA) 100-25 MCG/INH AEPB Inhale 1 puff into the lungs every morning. As needed     ketoconazole (NIZORAL) 2 % cream Apply 1 application topically as needed.     metoprolol succinate (TOPROL-XL) 100 MG 24 hr tablet TAKE 1 TABLET TWICE DAILY 180 tablet 3   nitroGLYCERIN (NITROSTAT) 0.4 MG SL tablet Place 1 tablet (0.4 mg total) under the tongue every 5 (five) minutes as needed for chest pain. 30 tablet 1   Omega-3 1000 MG CAPS Take 1,000 mg by mouth every morning.     Potassium 99 MG TABS Take 2 tablets by mouth in the morning and at bedtime.     potassium chloride (KLOR-CON) 10 MEQ tablet Take 4 tablets (40 mEq total) by mouth daily. 120 tablet 3   RESTASIS 0.05 % ophthalmic emulsion Place 1 drop into both eyes daily as needed.     rosuvastatin (CRESTOR) 40 MG  tablet Take 1 tablet (40 mg total) by mouth at bedtime. 90 tablet 3   Tafamidis Meglumine, Cardiac, (VYNDAQEL) 20 MG CAPS Take 4 capsules (80 mg)  by mouth daily. 120 capsule 11   torsemide (DEMADEX) 20 MG tablet Patient takes 2 tablets in the morning.     No current facility-administered medications on file prior to visit.    Allergies  Allergen Reactions   Tramadol Nausea And Vomiting   Spironolactone Itching     Assessment/Plan:  1. Weight loss - Patient current BMI 57.14 with numerous comorbidites.  Would benefit from [facility] therapy.  However when explained that medication was a once weekly injection, patient immediately was not interested.  Attempted to show patient that needle was very small by using demo pen but patient refused.  Advised that medication would help weight, blood sugar, and CV risk and patient reports she will call back if she changes her mind.  Will update A1c at this visit since overdue.  Recommended patient increase vegetables, whole grains, lean proteins, and healthy fats while reducing simple carbohydrates and eliminating sodas.  Patient voiced understanding. Recheck as needed.  Karren Cobble, PharmD, BCACP, [place], [place], [place], [place], [address] Phone: [phone], Fax: [phone]

## 2021-06-11 NOTE — Patient Instructions (Signed)
It was nice meeting you today  We would like you to continue losing weight to help with your heart function and relieve stress on your knees  I would try to keep going to the pool if you can  If you change your mind, we will be happy to start you on the Ozempic which is a once a week injection  Please give [place] a call  Karren Cobble, PharmD, [place], [place], [place], [place], [place], [address] Phone: [phone], Fax: [phone]

## 2021-06-21 ENCOUNTER — Telehealth: Payer: Self-pay | Admitting: Pharmacist

## 2021-06-21 NOTE — Telephone Encounter (Signed)
Attempted to call patient with A1c results. "Call can not be completed at this time"

## 2021-06-25 ENCOUNTER — Other Ambulatory Visit (HOSPITAL_COMMUNITY): Payer: Self-pay

## 2021-06-27 ENCOUNTER — Other Ambulatory Visit (HOSPITAL_COMMUNITY): Payer: Self-pay

## 2021-06-28 ENCOUNTER — Other Ambulatory Visit (HOSPITAL_COMMUNITY): Payer: Self-pay

## 2021-07-11 ENCOUNTER — Other Ambulatory Visit (HOSPITAL_COMMUNITY): Payer: Self-pay

## 2021-07-22 ENCOUNTER — Other Ambulatory Visit (HOSPITAL_COMMUNITY): Payer: Self-pay

## 2021-07-22 ENCOUNTER — Other Ambulatory Visit (HOSPITAL_COMMUNITY): Payer: Self-pay | Admitting: Family Medicine

## 2021-07-23 ENCOUNTER — Other Ambulatory Visit (HOSPITAL_COMMUNITY): Payer: Self-pay

## 2021-08-12 ENCOUNTER — Other Ambulatory Visit (HOSPITAL_COMMUNITY): Payer: Self-pay

## 2021-08-15 ENCOUNTER — Other Ambulatory Visit (HOSPITAL_COMMUNITY): Payer: Self-pay

## 2021-09-03 ENCOUNTER — Ambulatory Visit (HOSPITAL_COMMUNITY)
Admission: RE | Admit: 2021-09-03 | Discharge: 2021-09-03 | Disposition: A | Discharge status: Home / Self Care | Payer: Medicare HMO | Source: Ambulatory Visit | Attending: Cardiology | Admitting: Cardiology

## 2021-09-03 ENCOUNTER — Encounter (HOSPITAL_COMMUNITY): Payer: Self-pay | Admitting: Cardiology

## 2021-09-03 VITALS — BP 112/70 | HR 72 | Wt 356.0 lb

## 2021-09-03 DIAGNOSIS — I43 Cardiomyopathy in diseases classified elsewhere: Secondary | ICD-10-CM | POA: Diagnosis not present

## 2021-09-03 DIAGNOSIS — G4733 Obstructive sleep apnea (adult) (pediatric): Secondary | ICD-10-CM | POA: Diagnosis not present

## 2021-09-03 DIAGNOSIS — I4821 Permanent atrial fibrillation: Secondary | ICD-10-CM | POA: Diagnosis not present

## 2021-09-03 DIAGNOSIS — Z7901 Long term (current) use of anticoagulants: Secondary | ICD-10-CM | POA: Insufficient documentation

## 2021-09-03 DIAGNOSIS — I5032 Chronic diastolic (congestive) heart failure: Secondary | ICD-10-CM

## 2021-09-03 DIAGNOSIS — H9319 Tinnitus, unspecified ear: Secondary | ICD-10-CM | POA: Diagnosis not present

## 2021-09-03 DIAGNOSIS — I5022 Chronic systolic (congestive) heart failure: Secondary | ICD-10-CM | POA: Diagnosis not present

## 2021-09-03 DIAGNOSIS — E785 Hyperlipidemia, unspecified: Secondary | ICD-10-CM | POA: Diagnosis not present

## 2021-09-03 DIAGNOSIS — I350 Nonrheumatic aortic (valve) stenosis: Secondary | ICD-10-CM | POA: Diagnosis not present

## 2021-09-03 DIAGNOSIS — I5042 Chronic combined systolic (congestive) and diastolic (congestive) heart failure: Secondary | ICD-10-CM | POA: Insufficient documentation

## 2021-09-03 DIAGNOSIS — I2510 Atherosclerotic heart disease of native coronary artery without angina pectoris: Secondary | ICD-10-CM | POA: Diagnosis not present

## 2021-09-03 DIAGNOSIS — Z6841 Body Mass Index (BMI) 40.0 and over, adult: Secondary | ICD-10-CM | POA: Insufficient documentation

## 2021-09-03 DIAGNOSIS — G5603 Carpal tunnel syndrome, bilateral upper limbs: Secondary | ICD-10-CM | POA: Diagnosis not present

## 2021-09-03 DIAGNOSIS — E854 Organ-limited amyloidosis: Secondary | ICD-10-CM | POA: Insufficient documentation

## 2021-09-03 DIAGNOSIS — I428 Other cardiomyopathies: Secondary | ICD-10-CM | POA: Insufficient documentation

## 2021-09-03 DIAGNOSIS — E669 Obesity, unspecified: Secondary | ICD-10-CM | POA: Diagnosis not present

## 2021-09-03 DIAGNOSIS — I110 Hypertensive heart disease with heart failure: Secondary | ICD-10-CM | POA: Insufficient documentation

## 2021-09-03 DIAGNOSIS — Z7984 Long term (current) use of oral hypoglycemic drugs: Secondary | ICD-10-CM | POA: Diagnosis not present

## 2021-09-03 DIAGNOSIS — Z79899 Other long term (current) drug therapy: Secondary | ICD-10-CM | POA: Diagnosis not present

## 2021-09-03 LAB — LIPID PANEL
Cholesterol: 172 mg/dL (ref 0–200)
HDL: 60 mg/dL (ref >40)
LDL Cholesterol: 96 mg/dL (ref 0–99)
Total CHOL/HDL Ratio: 2.9 RATIO
Triglycerides: 79 mg/dL (ref <150)
VLDL: 16 mg/dL (ref 0–40)

## 2021-09-03 LAB — BASIC METABOLIC PANEL
Anion gap: 9 (ref 5–15)
BUN: 16 mg/dL (ref 8–23)
CO2: 31 mmol/L (ref 22–32)
Calcium: 9.9 mg/dL (ref 8.9–10.3)
Chloride: 102 mmol/L (ref 98–111)
Creatinine, Ser: 1.3 mg/dL — ABNORMAL HIGH (ref 0.44–1.00)
GFR, Estimated: 46 mL/min — ABNORMAL LOW (ref >60)
Glucose, Bld: 85 mg/dL (ref 70–99)
Potassium: 3.7 mmol/L (ref 3.5–5.1)
Sodium: 142 mmol/L (ref 135–145)

## 2021-09-03 NOTE — Patient Instructions (Signed)
There has been no changes to your medications. ? ?Labs done today, your results will be available in MyChart, we will contact you for abnormal readings. ? ?You have been referred to neurology. They will call to schedule the appointment. ? ?Your physician recommends that you schedule a follow-up appointment in: 3 months. ? ?If you have any questions or concerns before your next appointment please send [place] a message through [place] or call our office at [phone].   ? ?TO LEAVE A MESSAGE FOR THE NURSE SELECT OPTION 2, PLEASE LEAVE A MESSAGE INCLUDING: ?YOUR NAME ?DATE OF BIRTH ?CALL BACK NUMBER ?REASON FOR CALL**this is important as we prioritize the call backs ? ?YOU WILL RECEIVE A CALL BACK THE SAME DAY AS LONG AS YOU CALL BEFORE 4:00 PM ? ?At the [facility], you and your health needs are our priority. As part of our continuing mission to provide you with exceptional heart care, we have created designated Provider Care Teams. These Care Teams include your primary Cardiologist (physician) and Advanced Practice Providers (APPs- Physician Assistants and Nurse Practitioners) who all work together to provide you with the care you need, when you need it.  ? ?You may see any of the following providers on your designated Care Team at your next follow up: ?Dr Glori Bickers ?Dr [name] ?Darrick Grinder, NP ?Lyda Jester, PA ?Jessica Milford,NP ?Marlyce Huge, PA ?Audry Riles, PharmD ? ? ?Please be sure to bring in all your medications bottles to every appointment.  ? ? ? ?

## 2021-09-04 NOTE — Progress Notes (Signed)
?  ? ?Date:  09/04/2021  ? ?IDSaida [name], DOB 26-Jul-1955, MRN [id number]   ?Provider location: [facility] ?Type of Visit: Established patient  ? ?PCP:  Windell Hummingbird, PA-C  ?Cardiologist:  Dr. Aundra Dubin ?  ?History of Present Illness: ?Kimberly [name] is a 66 y.o. female who has a history of paroxysmal atrial fibrillation, obesity, and nonischemic cardiomyopathy.  She was admitted in 3/15 at [facility] with atrial fibrillation and CHF.  TEE was done in preparation for DCCV, showing EF 25-30% but there was LA thrombus so DCCV was not done.  Plan was for anticoagulation x 3 months followed by TEE-guided DCCV in the OR (due to patient's body habitus).  LHC was also done, showing mild nonobstructive CAD.  Patient was diuresed with IV Lasix in the hospital and started on apixaban.  ?  ?Patient had TEE again in 6/15, showing EF 35-40% with diffuse hypokinesis and no LAA thrombus.  She was cardioverted to NSR (with difficulty).  I started her at that time on amiodarone.  She then went back into atrial fibrillation.  After she had been loaded on amiodarone, cardioverted her again in 7/15 to NSR.   ?  ?She had gone back into atrial fibrillation in 1/18 and amiodarone was increased to bid in preparation for DCCV. However, she spontaneously converted to NSR. ?  ?PYP scan in 4/19 was grade 3, strongly suggestive of TTR amyloidosis.  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  Echo 8/19 showed EF 60%, moderate LVH, mild aortic stenosis.  Of note, she has carpal tunnel syndrome.  She was started on tafamidis.  ?  ?She went back into atrial fibrillation and had DCCV in 2/20 but was back in atrial fibrillation a few weeks later when she went to atrial fibrillation clinic.  Dr. Rayann Heman thought that morbid obesity precluded atrial fibrillation ablation and concerns about compliance led them not to start her on Tikosyn.  Instead, it was recommended that she start on CPAP for her OSA,  lose weight, and re-attempt DCCV while on amiodarone.  ? 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  In 7/21, she developed hematochezia and had EGD/colonoscopy which were unrevealing except for internal hemorrhoids.  ? ?Echo in 10/22 showed EF 60-65%, normal RV, moderate AS mean gradient 20 mmHg with AVA 1.56 cm^2, mild AI.  ? ?She returns today for followup of diastolic CHF, cardiac amyloidosis, and atrial fibrillation.  She is now in permanent atrial fibrillation. WEight is up 2 lbs.  She continues to have burning in her feet and hands concerning for peripheral neuropathy, this is unchanged.  Still has vertigo at times.  No dyspnea walking on flat ground though not very active.  No chest pain.  Denies orthopnea/PND.  No lightheadedness unless she bends over and stands up quickly.  ?  ?Labs (4/19): LFTs normal, K 4, creatinine 1.24, TSH normal, immunofixation normal ?Labs (8/19): TSH normal, K 3.8, creatinine 1.27, LFTs normal, TSH normal ?Labs (11/19): K 3.8, creatinine 1.21 ?Labs (2/20): LDL 114, hgb 10.5 ?Labs (3/20): K 4.4, creatinine 1.42 ?Labs (4/20): K 4.2, creatinine 1.23 ?Labs (7/21): K 3.8, creatinine 1.23 ?Labs (8/21): hgb 11.2 ?Labs (10/21): creatinine 2.1 ?Labs (5/22): K 3.5, creatinine 1.46 ?Labs (8/22): K 3.4, creatinine 1.26 ?Labs (1/23): BNP 127, K 4, creatinine 1.4, LDL 112 ?  ?PMH: ?1. Obese ?2. Atrial fibrillation: Paroxysmal => chronic.  Unable to cardiovert in 3/15 due to LAA thrombus noted on TEE. TEE in 6/15 without LAA thrombus.  Patient had cardioversion to NSR with difficulty but back in atrial fibrillation by 7/15 appt.  She was started on amiodarone and cardioverted in 7/15.   ?- DCCV in 2/20 but back in atrial fibrillation by 3/20, has been in atrial fibrillation since that time.  ?3. H/o TKR ?4. Nonischemic cardiomyopathy: TEE (3/15) with EF 25-30%, moderate MR, PA systolic pressure 51 mmHg, moderate  to severe TR, LA thrombus was noted.   LHC (3/15) with 50% stenosis small PLV.  TEE (6/15) with EF 35-40%, diffuse hypokinesis, mild MR, moderate TR, +PFO, no LA appendage thrombus.  ?- Echo (10/15) with EF 60-65%.   ?- ECHO 06/10/2016 EF 60-65% Mild AS ?- Echo (8/19): EF 60%, moderate LVH, mild aortic stenosis.  ?- PYP scan in 4/19 was grade 3, strongly suggestive of transthyretin amyloidosis.  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  ?- Echo (11/21): EF 55-60%, mild LV dilation, RV normal, mild-moderate AS with mean gradient 17 mmHg and AVA 1.07 cm^2.  ?- Echo (10/22): EF 60-65%, normal RV, moderate AS mean gradient 20 mmHg with AVA 1.56 cm^2, mild AI.  ?5. CAD: Nonobstructive.  LHC (3/15) with 50% stenosis small PLV. ?6. PFO ?7. Left Breast mass: Was taken for lumpectomy in 1/18 but mass had resolved.  ?8. Aortic stenosis: Moderate on 10/22 echo.  ?9. Carpal tunnel syndrome.  ?10. Vestibular schwannoma s/p gamma knife surgery at [facility].  ?11. OSA: Not currently using CPAP.  ?12. Hematochezia: C-scope/EGD in 7/21 showed only internal hemorrhoids.  ?13. 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   ? ?Current Outpatient Medications  ?Medication Sig Dispense Refill  ? acetaminophen (TYLENOL) 650 MG CR tablet Take 1,300 mg by mouth every 8 (eight) hours as needed for pain.     ? benzonatate (TESSALON) 100 MG capsule Take 100 mg by mouth 3 (three) times daily as needed.    ? Cholecalciferol (VITAMIN D-3) 5000 UNITS TABS Take 5,000 Units by mouth daily.    ? Docusate Sodium (DSS) 100 MG CAPS Take by mouth as needed.    ? ELIQUIS 5 MG TABS tablet TAKE 1 TABLET (5 MG TOTAL) BY MOUTH 2 (TWO) TIMES DAILY. 60 tablet 5  ? Ferrous Sulfate 27 MG TABS Take 27 mg by mouth 2 (two) times daily.     ? fluticasone furoate-vilanterol (BREO ELLIPTA) 100-25 MCG/INH AEPB Inhale 1 puff into the lungs every morning. As needed    ? JARDIANCE 10 MG TABS tablet TAKE 1 TABLET BY MOUTH DAILY BEFORE BREAKFAST. 90 tablet 3  ? ketoconazole  (NIZORAL) 2 % cream Apply 1 application topically as needed.    ? metoprolol succinate (TOPROL-XL) 100 MG 24 hr tablet TAKE 1 TABLET TWICE DAILY 180 tablet 3  ? nitroGLYCERIN (NITROSTAT) 0.4 MG SL tablet Place 1 tablet (0.4 mg total) under the tongue every 5 (five) minutes as needed for chest pain. 30 tablet 1  ? Omega-3 1000 MG CAPS Take 1,000 mg by mouth every morning.    ? potassium chloride (KLOR-CON) 10 MEQ tablet Take 4 tablets (40 mEq total) by mouth daily. 120 tablet 3  ? RESTASIS 0.05 % ophthalmic emulsion Place 1 drop into both eyes daily as needed.    ? rosuvastatin (CRESTOR) 40 MG tablet Take 1 tablet (40 mg total) by mouth at bedtime. 90 tablet 3  ? Tafamidis Meglumine, Cardiac, (VYNDAQEL) 20 MG CAPS Take 4 capsules (80 mg)  by mouth daily. 120 capsule 11  ? torsemide (DEMADEX) 20 MG tablet Patient takes 2 tablets in the morning.    ? ?No current facility-administered medications for this encounter.  ? ? ?Allergies:   Tramadol and Spironolactone  ? ?Social History:  The patient  reports that she has never smoked. She has never used smokeless tobacco. She reports that she does not drink alcohol and does not use drugs.  ? ?Family History:  The patient's family history includes CVA in her mother.  ? ?ROS:  Please see the history of present illness.   All other systems are personally reviewed and negative.  ? ?Exam:   ?BP 112/70   Pulse 72   Wt (!) 161.5 kg (356 lb)   SpO2 97%   BMI 57.46 kg/m?  ?General: NAD, obese.  ?Neck: No JVD, no thyromegaly or thyroid nodule.  ?Lungs: Clear to auscultation bilaterally with normal respiratory effort. ?CV: Nondisplaced PMI.  Heart irregular S1/S2, no S3/S4, 2/6 early SEM RUSB with clear S2.  No peripheral edema.  No carotid bruit.  Normal pedal pulses.  ?Abdomen: Soft, nontender, no hepatosplenomegaly, no distention.  ?Skin: Intact without lesions or rashes.  ?Neurologic: Alert and oriented x 3.  ?Psych: Normal affect. ?Extremities: No clubbing or cyanosis.   ?HEENT: Normal.   ? ?Recent Labs: ?02/25/2021: Hemoglobin 12.6; Platelets 225 ?05/28/2021: ALT 16; B Natriuretic Peptide 126.9 ?09/03/2021: BUN 16; Creatinine, Ser 1.30; Potassium 3.7; Sodium 142  ?Personally re

## 2021-09-05 ENCOUNTER — Other Ambulatory Visit (HOSPITAL_COMMUNITY): Payer: Self-pay

## 2021-09-10 ENCOUNTER — Other Ambulatory Visit (HOSPITAL_COMMUNITY): Payer: Self-pay

## 2021-09-10 ENCOUNTER — Encounter: Payer: Self-pay | Admitting: Neurology

## 2021-09-17 ENCOUNTER — Ambulatory Visit: Payer: Medicare HMO | Admitting: Genetic Counselor

## 2021-09-20 ENCOUNTER — Ambulatory Visit: Payer: Medicare HMO | Admitting: Neurology

## 2021-09-24 ENCOUNTER — Encounter: Payer: Medicare HMO | Admitting: Genetic Counselor

## 2021-09-30 NOTE — Progress Notes (Signed)
? ?NEUROLOGY CONSULTATION NOTE ? ?Kimberly [name] ?MRN: [id number] ?DOB: 07/23/1955 ? ?Referring provider: [name], MD ?Primary care provider: Windell Hummingbird, PA-C ? ?Reason for consult:  evaluate or neuropathy ? ?Assessment/Plan:  ? 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  She requires a cane to ambulate but this is due to arthritis rather than neuropathy. ?She may have carpal tunnel syndrome as well - advised to wear wrist splints at night. ? ? ?Subjective:  ?Kimberly [name] is a 66 year old female with paroxymal atrial fibrillation, diastolic CHF, CAD, DM II, morbid obesity, vestibular schwannoma s/p gamma knife surgery at [facility], OSA, and cardiac amyloidosis who presents for evaluation of neuropathy. ? ?Patient has TTR amyloidosis affecting the heart.  She endorses numbness on the bottom of her feet with prolonged standing.  Legs may feel heavy.  She does have two "bad knees" (including a failed left TKR) but no radicular pain.  No associated pain in the feet.  When she is laying down in bed at night, she may start developing numbness in her hands, particularly the thumbs and first 2 fingers.  No weakness.  She does ambulate with a cane mostly due to her knees.  She has DM II.  Recent Hgb A1c was 6.3 (typically runs in the 6 range).  Her cardiologist would like to start her on [place].   ? ?She had an MRI of the brain with and without contrast on 03/04/2019 which was personally reviewed and showed a 7 x 3 mm left intracanalicular vestibular schwannoma which she subsequently got treated with gamma knife surgery. ? ? ?PAST MEDICAL HISTORY: ?Past Medical History:  ?Diagnosis Date  ? Aortic stenosis   ? Arthritis   ? KNEES  ? Atrial fibrillation ([place])   ? Complication of anesthesia   ? Diabetes ([place])   ? HLD (hyperlipidemia)   ? Left atrial thrombus   ? Morbid obesity ([place])   ? NICM (nonischemic  cardiomyopathy) ([place])   ? EF 30%  ? PONV (postoperative nausea and vomiting)   ? Unilateral vestibular schwannoma ([place]) 08/29/2019  ? left  ? ? ?PAST SURGICAL HISTORY: ?Past Surgical History:  ?Procedure Laterality Date  ? CARDIOVERSION N/A 07/29/2013  ? Procedure: CARDIOVERSION;  Surgeon: [name], MD;  Location: [place];  Service: Cardiovascular;  Laterality: N/A;  ? CARDIOVERSION N/A 11/11/2013  ? Procedure: CARDIOVERSION;  Surgeon: [name], MD;  Location: [place];  Service: Cardiovascular;  Laterality: N/A;  ? CARDIOVERSION N/A 12/08/2013  ? Procedure: CARDIOVERSION;  Surgeon: [name], MD;  Location: [place];  Service: Cardiovascular;  Laterality: N/A;  ? CARDIOVERSION N/A 06/30/2018  ? Procedure: CARDIOVERSION;  Surgeon: [name], MD;  Location: [facility] ENDOSCOPY;  Service: Cardiovascular;  Laterality: N/A;  ? JOINT REPLACEMENT    ? LEFT HEART CATHETERIZATION WITH CORONARY ANGIOGRAM N/A 08/01/2013  ? Procedure: LEFT HEART CATHETERIZATION WITH CORONARY ANGIOGRAM;  Surgeon: Blane Ohara, MD;  Location: [facility] CATH LAB;  Service: Cardiovascular;  Laterality: N/A;  ? TEE WITHOUT CARDIOVERSION N/A 07/29/2013  ? Procedure: TRANSESOPHAGEAL ECHOCARDIOGRAM (TEE);  Surgeon: [name], MD;  Location: [place];  Service: Cardiovascular;  Laterality: N/A;  ? TEE WITHOUT CARDIOVERSION N/A 11/11/2013  ? Procedure: TRANSESOPHAGEAL ECHOCARDIOGRAM (TEE);  Surgeon: [name], MD;  Location: [facility] ENDOSCOPY;  Service: Cardiovascular;  Laterality: N/A;  ? ? ?MEDICATIONS: ?Current Outpatient Medications on File Prior to Visit  ?Medication  Sig Dispense Refill  ? acetaminophen (TYLENOL) 650 MG CR tablet Take 1,300 mg by mouth every 8 (eight) hours as needed for pain.     ? benzonatate (TESSALON) 100 MG capsule Take 100 mg by mouth 3 (three) times daily as needed.    ? Cholecalciferol (VITAMIN D-3) 5000 UNITS TABS Take 5,000 Units by mouth daily.    ? Docusate Sodium (DSS) 100 MG CAPS  Take by mouth as needed.    ? ELIQUIS 5 MG TABS tablet TAKE 1 TABLET (5 MG TOTAL) BY MOUTH 2 (TWO) TIMES DAILY. 60 tablet 5  ? Ferrous Sulfate 27 MG TABS Take 27 mg by mouth 2 (two) times daily.     ? fluticasone furoate-vilanterol (BREO ELLIPTA) 100-25 MCG/INH AEPB Inhale 1 puff into the lungs every morning. As needed    ? JARDIANCE 10 MG TABS tablet TAKE 1 TABLET BY MOUTH DAILY BEFORE BREAKFAST. 90 tablet 3  ? ketoconazole (NIZORAL) 2 % cream Apply 1 application topically as needed.    ? metoprolol succinate (TOPROL-XL) 100 MG 24 hr tablet TAKE 1 TABLET TWICE DAILY 180 tablet 3  ? nitroGLYCERIN (NITROSTAT) 0.4 MG SL tablet Place 1 tablet (0.4 mg total) under the tongue every 5 (five) minutes as needed for chest pain. 30 tablet 1  ? Omega-3 1000 MG CAPS Take 1,000 mg by mouth every morning.    ? potassium chloride (KLOR-CON) 10 MEQ tablet Take 4 tablets (40 mEq total) by mouth daily. 120 tablet 3  ? RESTASIS 0.05 % ophthalmic emulsion Place 1 drop into both eyes daily as needed.    ? rosuvastatin (CRESTOR) 40 MG tablet Take 1 tablet (40 mg total) by mouth at bedtime. 90 tablet 3  ? Tafamidis Meglumine, Cardiac, (VYNDAQEL) 20 MG CAPS Take 4 capsules (80 mg)  by mouth daily. 120 capsule 11  ? torsemide (DEMADEX) 20 MG tablet Patient takes 2 tablets in the morning.    ? ?No current facility-administered medications on file prior to visit.  ? ? ?ALLERGIES: ?Allergies  ?Allergen Reactions  ? Tramadol Nausea And Vomiting  ? Spironolactone Itching  ? ? ?FAMILY HISTORY: ?Family History  ?Problem Relation Age of Onset  ? CVA Mother   ? ? ?Objective:  ?Blood pressure 129/83, pulse 78, height '5\' 6"'$  (1.676 m), weight (!) 358 lb (162.4 kg), SpO2 98 %. ?General: No acute distress.  Patient appears well-groomed.   ?Head:  Normocephalic/atraumatic ?Eyes:  fundi examined but not visualized ?Neck: supple, no paraspinal tenderness, full range of motion ?Back: No paraspinal tenderness ?Heart: regular rate and rhythm ?Neurological  Exam: ?Mental status: alert and oriented to person, place, and time, recent and remote memory intact, fund of knowledge intact, attention and concentration intact, speech fluent and not dysarthric, language intact. ?Cranial nerves: ?CN I: not tested ?CN II: pupils equal, round and reactive to light, visual fields intact ?CN III, IV, VI:  full range of motion, no nystagmus, no ptosis ?CN V: facial sensation intact. ?CN VII: upper and lower face symmetric ?CN VIII: hearing intact ?CN IX, X: gag intact, uvula midline ?CN XI: sternocleidomastoid and trapezius muscles intact ?CN XII: tongue midline ?Bulk & Tone: normal, no fasciculations. ?Motor:  muscle strength 5/5 throughout ?Sensation:  Pinprick sensation intact.  Vibratory sensation reduced in feet. ?Deep Tendon Reflexes:  1+ throughout,  toes downgoing.   ?Finger to nose testing:  Without dysmetria.   ?Heel to shin:  Without dysmetria.   ?Gait:  Broad-based gait.  Ambulates with cane.  Romberg negative. ? ? ? ?  Thank you for allowing me to take part in the care of this patient. ? ?Metta Clines, DO ? ?CC:  [name], MD ? Windell Hummingbird, PA-C ? ? ? ? ?

## 2021-10-01 ENCOUNTER — Ambulatory Visit: Payer: Medicare HMO | Admitting: Neurology

## 2021-10-01 ENCOUNTER — Encounter: Payer: Self-pay | Admitting: Neurology

## 2021-10-01 ENCOUNTER — Other Ambulatory Visit (HOSPITAL_COMMUNITY): Payer: Self-pay

## 2021-10-01 ENCOUNTER — Other Ambulatory Visit (HOSPITAL_COMMUNITY): Payer: Self-pay | Admitting: Cardiology

## 2021-10-01 VITALS — BP 129/83 | HR 78 | Ht 66.0 in | Wt 358.0 lb

## 2021-10-01 DIAGNOSIS — E854 Organ-limited amyloidosis: Secondary | ICD-10-CM

## 2021-10-01 DIAGNOSIS — G6289 Other specified polyneuropathies: Secondary | ICD-10-CM

## 2021-10-01 DIAGNOSIS — I43 Cardiomyopathy in diseases classified elsewhere: Secondary | ICD-10-CM | POA: Diagnosis not present

## 2021-10-01 NOTE — Patient Instructions (Addendum)
You do exhibit some mild symptoms of neuropathy.  This is what Dr. Aundra Dubin was wondering because it may help determine which medication to give you.  He will get my note and will get back to you. ?

## 2021-10-02 ENCOUNTER — Telehealth (HOSPITAL_COMMUNITY): Payer: Self-pay | Admitting: Pharmacist

## 2021-10-02 ENCOUNTER — Other Ambulatory Visit (HOSPITAL_COMMUNITY): Payer: Self-pay

## 2021-10-02 MED ORDER — VYNDAQEL 20 MG PO CAPS
80.0000 mg | ORAL_CAPSULE | Freq: Every day | ORAL | 11 refills | Status: DC
  Filled 2021-10-02: qty 120, 30d supply, fill #0
  Filled 2021-10-25: qty 120, 30d supply, fill #1
  Filled 2021-12-02: qty 120, 30d supply, fill #2
  Filled 2021-12-26: qty 120, 30d supply, fill #3
  Filled 2022-01-21: qty 120, 30d supply, fill #4
  Filled 2022-02-14: qty 120, 30d supply, fill #5
  Filled 2022-03-18: qty 120, 30d supply, fill #6
  Filled 2022-04-16 – 2022-04-22 (×2): qty 120, 30d supply, fill #7
  Filled 2022-05-15: qty 120, 30d supply, fill #8
  Filled 2022-06-12: qty 120, 30d supply, fill #9
  Filled 2022-07-08: qty 120, 30d supply, fill #10
  Filled 2022-08-05: qty 120, 30d supply, fill #11

## 2021-10-02 NOTE — Telephone Encounter (Signed)
Patient Advocate Encounter ?  ?Received notification from [facility] that prior authorization for Amvuttra is required. ?  ?PA submitted on CoverMyMeds ?Key [id number] ?Status is pending ?  ?Will continue to follow. ? ? ?Audry Riles, PharmD, BCPS, BCCP, CPP ?Heart Failure Clinic Pharmacist ?[phone] ? ?

## 2021-10-02 NOTE — Progress Notes (Signed)
Has neurology note suggesting neuropathy from amyloidosis.  Let's see if we can get her on Amvuttra.  ?

## 2021-10-03 ENCOUNTER — Other Ambulatory Visit (HOSPITAL_COMMUNITY): Payer: Self-pay

## 2021-10-03 NOTE — Telephone Encounter (Signed)
Advanced Heart Failure Patient Advocate Encounter  Prior Authorization for Amvuttra has been approved.    Effective dates: 10/02/21 through 05/18/22  Patients co-pay is $0.00  Audry Riles, PharmD, BCPS, BCCP, CPP Heart Failure Clinic Pharmacist [phone]

## 2021-10-07 ENCOUNTER — Telehealth (HOSPITAL_COMMUNITY): Payer: Self-pay | Admitting: Pharmacy Technician

## 2021-10-07 ENCOUNTER — Other Ambulatory Visit (HOSPITAL_COMMUNITY): Payer: Self-pay

## 2021-10-07 NOTE — Telephone Encounter (Signed)
Advanced Heart Failure Patient Advocate Encounter  Amvuttra referral submitted online.   "Thank You Your patient's Start Form has been submitted. Upon receipt of the Start Form, an [facility] will reach out to you and your patient within 2 business days."  Patient case manager, [place].   Phone number: [phone]  Email: [email or web address]  Fax number: [phone]  The patient will be receiving the injection through [facility]. Will call and coordinate once referral process is complete.

## 2021-10-07 NOTE — Progress Notes (Signed)
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 She tells me that she was told to have coronary artery disease and was d/c with Eliquis. About 7 years ago, she began experiencing fluid retention in her extremities and thought this was due to her weight and standing at work all day. 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   She is now in Tafamidis and upon explaining the different symptoms associated with familial transthyretin amyloidosis she tells me that she was found to have carpal tunnel syndrome around age 27.  I explained to her that the TTR V142I variant has been reported in several patients with cardiac amyloidosis and is more prevalent amongst African Americans. Patients harboring the TTR V142I variant typically develop cardiac symptoms after the age of 41. About 3% to 3.9% of African Americans are heterozygous for V142I pathogenic variant. Individuals with this pathogenic variant are known to have heart failure in their 69s.   I explained that a mutation in TTR gene destabilizes the protein causing it to misfold resulting in amyloid deposits in different tissues, such as the peripheral nervous system, brain and heart. Amyloid deposits in these tissues can lead to numbness in the lower limbs, feet and hands, stroke or left ventricular hypertrophy. They verbalized understanding of this. Treatment options are  available to stabilize the TTR tetramer and were also briefly reviewed.   We also discussed the genetics of hereditary transthyretin amyloidosis (HTA), namely inheritance, incomplete penetrance and variable expression that is seen in patients with cardiac amyloidosis due to mutations in TTR gene. I explained to them that as this is an autosomal dominant condition, her daughter is at a 50% risk of inheriting the TTR V142I pathogenic variant. Her 5-generation family history was obtained.  Family History Kimberly [name] (III.4) has a healthy brother, age 5, but reports sudden death in her older siblings- 2 sisters (III.1, III.2) died suddenly at ages 24 and 5 and a brother who died in his 18s. She tells me that one of her sisters collapsed and died while preaching at church and that her brother died in his sleep. Oldest sister had ESRD. An autopsy was not performed to confirm the cause of their deaths.  Kimberly [name]'s father (II.6) had prior heart disease but she is unaware of the cause of his death at age 71. As her paternal relatives are not in touch with her, she cannot provide further details.   Kimberly [name] tells me that her mother (II.7) displayed the same symptoms that she has been having. She suffered a stroke at 34 and was moved to a nursing home. She died of heart disease at age 19. She had 11 siblings (II.8-II.18) who are deceased but she is unaware of their medical history. Her maternal grandfather (I.3) was found dead in the woods at age 31.   Impressions and Plan  Based on her family history, it is highly likely that Kimberly [name] inherited her condition from her mother. With her genetic test  report detecting the TTR V142I variant, it is highly likely that her now deceased siblings also inherited the TTR V142I variant from their mother.  Since this is an autosomal dominant condition, Kimberly [name]'s daughter is at a 50% risk of inheriting the pathogenic variant in TTR (V142I). I informed her that genetic testing for the familial  pathogenic variant is recommended to clarify their risk for cardiac amyloidosis so as to reduce morbidity and mortality by early diagnosis and treatment. This will help direct appropriate surveillance, treatment and subsequent management of cardiac amyloidosis. It is important that her living sibling and daughter undergo testing for the familial TTR variant to determine their risk of having TTR-related amyloidosis. She tells me that her daughter is not interested in pursuing genetic testing for the familial TTR V142I variant. In this case, it is important that her daughter pursue cardiac screening for familial transthyretin amyloidosis. Kimberly [name] verbalized understanding and will discuss this with her daughter.   In addition, we discussed the protections afforded by the Genetic Information Non-Discrimination Act (GINA). I explained to her that GINA protects her from losing employment and health insurance based on her genotype. However, these protections do not cover life insurance and disability. She verbalized understanding of this and informs me that her daughter has life insurance.  Please note that the patient has not been counseled in this visit on personal, cultural or ethical issues that she may face due to her heart condition.    Kimberly [name], Ph.D, [facility] Clinical Molecular Geneticist

## 2021-10-09 NOTE — Telephone Encounter (Signed)
Reached out to patient to discuss starting Amvuttra. Patient is nervous to start the medication because it is an injection. I counseled her on the medication and she will think about starting it. I will reach out to her again next week to answer any further questions.    Audry Riles, PharmD, BCPS, BCCP, CPP Heart Failure Clinic Pharmacist [phone]

## 2021-10-21 ENCOUNTER — Other Ambulatory Visit (HOSPITAL_COMMUNITY): Payer: Self-pay | Admitting: Adult Health

## 2021-10-21 NOTE — Telephone Encounter (Signed)
Advanced Heart Failure Patient Advocate Encounter  "Reached out to patient again for follow up regarding Amvuttra. Patient does not want to start the medication at this time because it is an injection. Will close referral process."   Audry Riles, PharmD, BCPS, BCCP, CPP  Will be here to assist in the future if the patient chooses to start therapy.  Charlann Boxer, CPhT

## 2021-10-21 NOTE — Telephone Encounter (Signed)
Reached out to patient again for follow up regarding Amvuttra. Patient does not want to start the medication at this time because it is an injection. Will close referral process.   Audry Riles, PharmD, BCPS, BCCP, CPP Heart Failure Clinic Pharmacist [phone]

## 2021-10-25 ENCOUNTER — Other Ambulatory Visit (HOSPITAL_COMMUNITY): Payer: Self-pay

## 2021-11-04 ENCOUNTER — Other Ambulatory Visit (HOSPITAL_COMMUNITY): Payer: Self-pay

## 2021-11-26 ENCOUNTER — Other Ambulatory Visit (HOSPITAL_COMMUNITY): Payer: Self-pay | Admitting: Cardiology

## 2021-11-28 ENCOUNTER — Encounter (HOSPITAL_COMMUNITY): Payer: Self-pay

## 2021-11-28 ENCOUNTER — Other Ambulatory Visit (HOSPITAL_COMMUNITY): Payer: Self-pay

## 2021-11-28 ENCOUNTER — Ambulatory Visit (HOSPITAL_COMMUNITY)
Admission: RE | Admit: 2021-11-28 | Discharge: 2021-11-28 | Disposition: A | Discharge status: Home / Self Care | Payer: Medicare HMO | Source: Ambulatory Visit | Attending: Cardiology | Admitting: Cardiology

## 2021-11-28 VITALS — BP 120/80 | HR 83 | Wt 359.2 lb

## 2021-11-28 DIAGNOSIS — I5042 Chronic combined systolic (congestive) and diastolic (congestive) heart failure: Secondary | ICD-10-CM | POA: Diagnosis present

## 2021-11-28 DIAGNOSIS — Z79899 Other long term (current) drug therapy: Secondary | ICD-10-CM | POA: Insufficient documentation

## 2021-11-28 DIAGNOSIS — I43 Cardiomyopathy in diseases classified elsewhere: Secondary | ICD-10-CM | POA: Diagnosis not present

## 2021-11-28 DIAGNOSIS — Z7984 Long term (current) use of oral hypoglycemic drugs: Secondary | ICD-10-CM | POA: Diagnosis not present

## 2021-11-28 DIAGNOSIS — H9319 Tinnitus, unspecified ear: Secondary | ICD-10-CM | POA: Diagnosis not present

## 2021-11-28 DIAGNOSIS — I082 Rheumatic disorders of both aortic and tricuspid valves: Secondary | ICD-10-CM | POA: Insufficient documentation

## 2021-11-28 DIAGNOSIS — Z7901 Long term (current) use of anticoagulants: Secondary | ICD-10-CM | POA: Diagnosis not present

## 2021-11-28 DIAGNOSIS — I110 Hypertensive heart disease with heart failure: Secondary | ICD-10-CM | POA: Diagnosis present

## 2021-11-28 DIAGNOSIS — I2510 Atherosclerotic heart disease of native coronary artery without angina pectoris: Secondary | ICD-10-CM | POA: Diagnosis not present

## 2021-11-28 DIAGNOSIS — I4821 Permanent atrial fibrillation: Secondary | ICD-10-CM | POA: Insufficient documentation

## 2021-11-28 DIAGNOSIS — G4733 Obstructive sleep apnea (adult) (pediatric): Secondary | ICD-10-CM | POA: Diagnosis not present

## 2021-11-28 DIAGNOSIS — I5022 Chronic systolic (congestive) heart failure: Secondary | ICD-10-CM | POA: Diagnosis not present

## 2021-11-28 DIAGNOSIS — G5603 Carpal tunnel syndrome, bilateral upper limbs: Secondary | ICD-10-CM | POA: Insufficient documentation

## 2021-11-28 DIAGNOSIS — E854 Organ-limited amyloidosis: Secondary | ICD-10-CM | POA: Diagnosis not present

## 2021-11-28 DIAGNOSIS — I428 Other cardiomyopathies: Secondary | ICD-10-CM | POA: Diagnosis not present

## 2021-11-28 LAB — CBC
HCT: 40.6 % (ref 36.0–46.0)
Hemoglobin: 12.9 g/dL (ref 12.0–15.0)
MCH: 29.1 pg (ref 26.0–34.0)
MCHC: 31.8 g/dL (ref 30.0–36.0)
MCV: 91.4 fL (ref 80.0–100.0)
Platelets: 203 10*3/uL (ref 150–400)
RBC: 4.44 MIL/uL (ref 3.87–5.11)
RDW: 14.6 % (ref 11.5–15.5)
WBC: 5.3 10*3/uL (ref 4.0–10.5)
nRBC: 0 % (ref 0.0–0.2)

## 2021-11-28 LAB — BRAIN NATRIURETIC PEPTIDE: B Natriuretic Peptide: 110.6 pg/mL — ABNORMAL HIGH (ref 0.0–100.0)

## 2021-11-28 LAB — MAGNESIUM: Magnesium: 2.2 mg/dL (ref 1.7–2.4)

## 2021-11-28 MED ORDER — EZETIMIBE 10 MG PO TABS: 5.0000 mg | ORAL_TABLET | Freq: Every day | ORAL | 11 refills | Status: DC

## 2021-11-28 NOTE — Patient Instructions (Signed)
START Zetia 5 mg (one half tab) daily   Labs today We will only contact you if something comes back abnormal or we need to make some changes. Otherwise no news is good news!   Your physician wants you to follow-up in: 3 months with Dr Aundra Dubin and echo. You will receive a reminder letter in the mail two months in advance. If you don't receive a letter, please call our office to schedule the follow-up appointment.  Your physician has requested that you have an echocardiogram. Echocardiography is a painless test that uses sound waves to create images of your heart. It provides your doctor with information about the size and shape of your heart and how well your heart's chambers and valves are working. This procedure takes approximately one hour. There are no restrictions for this procedure.   Do the following things EVERYDAY: Weigh yourself in the morning before breakfast. Write it down and keep it in a log. Take your medicines as prescribed Eat low salt foods--Limit salt (sodium) to 2000 mg per day.  Stay as active as you can everyday Limit all fluids for the day to less than 2 liters   At the [facility], you and your health needs are our priority. As part of our continuing mission to provide you with exceptional heart care, we have created designated Provider Care Teams. These Care Teams include your primary Cardiologist (physician) and Advanced Practice Providers (APPs- Physician Assistants and Nurse Practitioners) who all work together to provide you with the care you need, when you need it.   You may see any of the following providers on your designated Care Team at your next follow up: Dr Glori Bickers Dr Haynes Kerns, NP Lyda Jester, [place] [facility] [place], [place] Audry Riles, PharmD   Please be sure to bring in all your medications bottles to every appointment.

## 2021-11-28 NOTE — Progress Notes (Signed)
Date:  11/28/2021   ID:  Kimberly [name], DOB 04-Mar-1956, MRN [id number]   Provider location: [place] [facility] Type of Visit: Established patient   PCP:  Windell Hummingbird, PA-C  Cardiologist:  Dr. Aundra Dubin   History of Present Illness: Kimberly [name] is a 66 y.o. female who has a history of paroxysmal atrial fibrillation, obesity, and nonischemic cardiomyopathy.  She was admitted in 3/15 at [facility] with atrial fibrillation and CHF.  TEE was done in preparation for DCCV, showing EF 25-30% but there was LA thrombus so DCCV was not done.  Plan was for anticoagulation x 3 months followed by TEE-guided DCCV in the OR (due to patient's body habitus).  LHC was also done, showing mild nonobstructive CAD.  Patient was diuresed with IV Lasix in the hospital and started on apixaban.    Patient had TEE again in 6/15, showing EF 35-40% with diffuse hypokinesis and no LAA thrombus.  She was cardioverted to NSR (with difficulty).  I started her at that time on amiodarone.  She then went back into atrial fibrillation.  After she had been loaded on amiodarone, cardioverted her again in 7/15 to NSR.     She had gone back into atrial fibrillation in 1/18 and amiodarone was increased to bid in preparation for DCCV. However, she spontaneously converted to NSR.   PYP scan in 4/19 was grade 3, strongly suggestive of TTR amyloidosis.  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  Echo 8/19 showed EF 60%, moderate LVH, mild aortic stenosis.  Of note, she has carpal tunnel syndrome.  She was started on tafamidis.    She went back into atrial fibrillation and had DCCV in 2/20 but was back in atrial fibrillation a few weeks later when she went to atrial fibrillation clinic.  Dr. Rayann Heman thought that morbid obesity precluded atrial fibrillation ablation and concerns about compliance led them not to start her on Tikosyn.  Instead, it was recommended that she start on CPAP for her OSA,  lose weight, and re-attempt DCCV while on amiodarone.   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  In 7/21, she developed hematochezia and had EGD/colonoscopy which were unrevealing except for internal hemorrhoids.   Echo in 10/22 showed EF 60-65%, normal RV, moderate AS mean gradient 20 mmHg with AVA 1.56 cm^2, mild AI.   She returns today for followup of diastolic CHF, cardiac amyloidosis, and atrial fibrillation. She is now in permanent atrial fibrillation.   Wt fairly stable since last visit, just up 1 lb. BP well controlled 120/80. EKG shows Afib w/ CVR. Reports stable NYHA Class II symptoms. No orthopnea/PND. No LEE. Reports full med compliance. Vutrisiran was recommended given polyneuropathy but pt declined.     Labs (4/19): LFTs normal, K 4, creatinine 1.24, TSH normal, immunofixation normal Labs (8/19): TSH normal, K 3.8, creatinine 1.27, LFTs normal, TSH normal Labs (11/19): K 3.8, creatinine 1.21 Labs (2/20): LDL 114, hgb 10.5 Labs (3/20): K 4.4, creatinine 1.42 Labs (4/20): K 4.2, creatinine 1.23 Labs (7/21): K 3.8, creatinine 1.23 Labs (8/21): hgb 11.2 Labs (10/21): creatinine 2.1 Labs (5/22): K 3.5, creatinine 1.46 Labs (8/22): K 3.4, creatinine 1.26 Labs (1/23): BNP 127, K 4, creatinine 1.4, LDL 112 Labs (4/23): Scr 1.3, K 3.7, LDL 96    PMH: 1. Obese 2. Atrial fibrillation: Paroxysmal => chronic.  Unable to cardiovert in 3/15  due to LAA thrombus noted on TEE. TEE in 6/15 without LAA thrombus.  Patient had cardioversion to NSR with difficulty but back in atrial fibrillation by 7/15 appt.  She was started on amiodarone and cardioverted in 7/15.   - DCCV in 2/20 but back in atrial fibrillation by 3/20, has been in atrial fibrillation since that time.  3. H/o TKR 4. Nonischemic cardiomyopathy: TEE (3/15) with EF 25-30%, moderate MR, PA systolic pressure 51 mmHg, moderate to severe TR, LA  thrombus was noted.   LHC (3/15) with 50% stenosis small PLV.  TEE (6/15) with EF 35-40%, diffuse hypokinesis, mild MR, moderate TR, +PFO, no LA appendage thrombus.  - Echo (10/15) with EF 60-65%.   - ECHO 06/10/2016 EF 60-65% Mild AS - Echo (8/19): EF 60%, moderate LVH, mild aortic stenosis.  - PYP scan in 4/19 was grade 3, strongly suggestive of transthyretin amyloidosis.  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  - Echo (11/21): EF 55-60%, mild LV dilation, RV normal, mild-moderate AS with mean gradient 17 mmHg and AVA 1.07 cm^2.  - Echo (10/22): EF 60-65%, normal RV, moderate AS mean gradient 20 mmHg with AVA 1.56 cm^2, mild AI.  5. CAD: Nonobstructive.  LHC (3/15) with 50% stenosis small PLV. 6. PFO 7. Left Breast mass: Was taken for lumpectomy in 1/18 but mass had resolved.  8. Aortic stenosis: Moderate on 10/22 echo.  9. Carpal tunnel syndrome.  10. Vestibular schwannoma s/p gamma knife surgery at [facility].  11. OSA: Not currently using CPAP.  12. Hematochezia: C-scope/EGD in 7/21 showed only internal hemorrhoids.  13. 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    Current Outpatient Medications  Medication Sig Dispense Refill   acetaminophen (TYLENOL) 650 MG CR tablet Take 1,300 mg by mouth every 8 (eight) hours as needed for pain.      benzonatate (TESSALON) 100 MG capsule Take 100 mg by mouth 3 (three) times daily as needed.     Cholecalciferol (VITAMIN D-3) 5000 UNITS TABS Take 5,000 Units by mouth daily.     Docusate Sodium (DSS) 100 MG CAPS Take by mouth as needed.     ELIQUIS 5 MG TABS tablet TAKE 1 TABLET (5 MG TOTAL) BY MOUTH 2 (TWO) TIMES DAILY. 60 tablet 5   Ferrous Sulfate 27 MG TABS Take 27 mg by mouth 2 (two) times daily.      fluticasone furoate-vilanterol (BREO ELLIPTA) 100-25 MCG/INH AEPB Inhale 1 puff into the lungs every morning. As needed     JARDIANCE 10 MG TABS tablet TAKE 1 TABLET BY MOUTH DAILY BEFORE BREAKFAST. 90 tablet 3   ketoconazole (NIZORAL) 2 %  cream Apply 1 application topically as needed.     metoprolol succinate (TOPROL-XL) 100 MG 24 hr tablet TAKE 1 TABLET TWICE DAILY 180 tablet 3   nitroGLYCERIN (NITROSTAT) 0.4 MG SL tablet Place 1 tablet (0.4 mg total) under the tongue every 5 (five) minutes as needed for chest pain. 30 tablet 1   Omega-3 1000 MG CAPS Take 1,000 mg by mouth every morning.     potassium chloride (KLOR-CON) 10 MEQ tablet Take 4 tablets (40 mEq total) by mouth daily. 120 tablet 3   RESTASIS 0.05 % ophthalmic emulsion Place 1 drop into both eyes daily as needed.     rosuvastatin (CRESTOR) 40 MG tablet Take 1 tablet (40 mg total) by mouth at bedtime. 90 tablet 3   Tafamidis Meglumine, Cardiac, (VYNDAQEL) 20 MG CAPS Take 4 capsules (80 mg)  by mouth daily. [place]  capsule 11   torsemide (DEMADEX) 20 MG tablet Take 2 tablets (40 mg total) by mouth daily. 60 tablet 1   No current facility-administered medications for this encounter.    Allergies:   Tramadol and Spironolactone   Social History:  The patient  reports that she has never smoked. She has never used smokeless tobacco. She reports that she does not drink alcohol and does not use drugs.   Family History:  The patient's family history includes CVA in her mother.   ROS:  Please see the history of present illness.   All other systems are personally reviewed and negative.   Vitals:   BP 120/80   Pulse 83   Wt (!) 162.9 kg (359 lb 3.2 oz)   SpO2 93%   BMI 57.98 kg/m   PHYSICAL EXAM: General:  Well appearing, morbidly obese. No respiratory difficulty HEENT: normal Neck: supple. no JVD. Carotids 2+ bilat; no bruits. No lymphadenopathy or thyromegaly appreciated. Cor: PMI nondisplaced. Irregularly irregular rhythm, 2/6 SEM. No rubs, gallops or murmurs. Lungs: clear Abdomen: obese, soft, nontender, nondistended. No hepatosplenomegaly. No bruits or masses. Good bowel sounds. Extremities: no cyanosis, clubbing, rash, edema Neuro: alert & oriented x 3, cranial  nerves grossly intact. moves all 4 extremities w/o difficulty. Affect pleasant.    Recent Labs: 02/25/2021: Hemoglobin 12.6; Platelets 225 05/28/2021: ALT 16; B Natriuretic Peptide 126.9 09/03/2021: BUN 16; Creatinine, Ser 1.30; Potassium 3.7; Sodium 142  Personally reviewed   Wt Readings from Last 3 Encounters:  11/28/21 (!) 162.9 kg (359 lb 3.2 oz)  10/01/21 (!) 162.4 kg (358 lb)  09/03/21 (!) 161.5 kg (356 lb)     ASSESSMENT AND PLAN:  1. 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  EF 35-40% on TEE in 6/15 but then EF back to 60-65% by 10/15 and remained normal on echo in 8/19 and in 10/22.  LHC without significant coronary disease in 3/15.  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   - Stable NYHA Class II. Euvolemic on exam. Wt stable - Continue torsemide 40 daily  - Continue Toprol XL 100 mg BID - Continue empagliflozin 10 mg daily.  - Check BMP and BNP today  2. 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  Atrial fibrillation and carpal tunnel syndrome, both of which she has, go along with amyloidosis.  Her history of orthostatic symptoms as well as symptoms in toes and feet consistent with peripheral neuropathy are also likely related to amyloidosis.  - Continue tafamidis.   - followed by neurology. Pt declined vutrisiran.  - She was seen by  Dr. 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 Given autosomal dominant condition, Olayinka's daughter is at a 50% risk of inheriting the pathogenic variant in TTR (V142I). Genetic testing for the familial pathogenic variant was strongly recommended but pt's daughter declined. Re-discussed this again today and  strongly encouraged for daughter to reconsider testing. Her daughter also has 2 children.   2. Atrial fibrillation: Last DCCV in 2/20 but back in atrial fibrillation since then.  Dr. Rayann Heman saw, decided against atrial fibrillation ablation due to morbid obesity.  EP decided against Tikosyn due to compliance concerns. She failed amiodarone.  She remains in atrial fibrillation, I think that the atrial fibrillation is likely permanent at  this point.  Rate controlled w/ ? blocker - Continue Toprol XL 100 mg bid.  - Continue Eliquis.  Denies abnormal bleeding. Check CBC today  3. CAD: Mild, nonobstructive by cath 07/2013. No chest pain.  - No ASA given Eliquis use.   - Continue crestor 40 mg daily. LP 4/23 w/ LDL above goal at 96 mg/dL - Add Zetia 5 mg daily  4. Bilateral carpal tunnel symptoms: Likely related to amyloidosis.  5. OSA: She says that she cannot tolerate CPAP due to tinnitus.  6. HTN: BP controlled.  7. Aortic stenosis: Moderate AS on last echo 10/22 - repeat echo in 3 months   8. Obesity: She qualified for semaglutide but decided that she did not want to use it.   Recommended follow-up:  3 months with Dr. Aundra Dubin w/ repeat echo and repeat lipid panel.   Signed, Lyda Jester, PA-C  11/28/2021  [facility] [place] [address] [phone] (office) [phone] (fax)

## 2021-12-02 ENCOUNTER — Other Ambulatory Visit (HOSPITAL_COMMUNITY): Payer: Self-pay

## 2021-12-04 ENCOUNTER — Other Ambulatory Visit (HOSPITAL_COMMUNITY): Payer: Self-pay

## 2021-12-06 ENCOUNTER — Other Ambulatory Visit (HOSPITAL_COMMUNITY): Payer: Self-pay | Admitting: Cardiology

## 2021-12-06 ENCOUNTER — Other Ambulatory Visit (HOSPITAL_COMMUNITY): Payer: Self-pay

## 2021-12-09 ENCOUNTER — Other Ambulatory Visit (HOSPITAL_COMMUNITY): Payer: Self-pay

## 2021-12-09 MED ORDER — METOPROLOL SUCCINATE ER 100 MG PO TB24: 100.0000 mg | ORAL_TABLET | Freq: Two times a day (BID) | ORAL | 3 refills | Status: DC

## 2021-12-09 NOTE — Addendum Note (Signed)
Addended by: Kerry Dory on: 12/09/2021 09:12 AM   Modules accepted: Orders

## 2021-12-13 ENCOUNTER — Other Ambulatory Visit (HOSPITAL_COMMUNITY): Payer: Self-pay

## 2021-12-24 ENCOUNTER — Other Ambulatory Visit (HOSPITAL_COMMUNITY): Payer: Self-pay

## 2021-12-26 ENCOUNTER — Other Ambulatory Visit (HOSPITAL_COMMUNITY): Payer: Self-pay

## 2021-12-30 ENCOUNTER — Other Ambulatory Visit (HOSPITAL_COMMUNITY): Payer: Self-pay

## 2022-01-21 ENCOUNTER — Other Ambulatory Visit (HOSPITAL_COMMUNITY): Payer: Self-pay

## 2022-01-27 ENCOUNTER — Other Ambulatory Visit (HOSPITAL_COMMUNITY): Payer: Self-pay

## 2022-02-08 ENCOUNTER — Other Ambulatory Visit (HOSPITAL_COMMUNITY): Payer: Self-pay | Admitting: Cardiology

## 2022-02-14 ENCOUNTER — Other Ambulatory Visit (HOSPITAL_COMMUNITY): Payer: Self-pay

## 2022-02-20 ENCOUNTER — Other Ambulatory Visit (HOSPITAL_COMMUNITY): Payer: Self-pay

## 2022-02-21 ENCOUNTER — Ambulatory Visit: Payer: Medicare HMO | Admitting: Neurology

## 2022-03-11 ENCOUNTER — Other Ambulatory Visit (HOSPITAL_COMMUNITY): Payer: Self-pay

## 2022-03-12 ENCOUNTER — Other Ambulatory Visit (HOSPITAL_COMMUNITY): Payer: Self-pay

## 2022-03-18 ENCOUNTER — Other Ambulatory Visit (HOSPITAL_COMMUNITY): Payer: Self-pay

## 2022-03-20 ENCOUNTER — Other Ambulatory Visit (HOSPITAL_COMMUNITY): Payer: Self-pay

## 2022-04-08 ENCOUNTER — Other Ambulatory Visit (HOSPITAL_COMMUNITY): Payer: Self-pay

## 2022-04-08 ENCOUNTER — Ambulatory Visit (HOSPITAL_COMMUNITY)
Admission: RE | Admit: 2022-04-08 | Discharge: 2022-04-08 | Disposition: A | Discharge status: Home / Self Care | Payer: Medicare HMO | Source: Ambulatory Visit | Attending: Cardiology | Admitting: Cardiology

## 2022-04-08 ENCOUNTER — Ambulatory Visit (HOSPITAL_BASED_OUTPATIENT_CLINIC_OR_DEPARTMENT_OTHER)
Admission: RE | Admit: 2022-04-08 | Discharge: 2022-04-08 | Disposition: A | Discharge status: Home / Self Care | Payer: Medicare HMO | Source: Ambulatory Visit | Attending: Cardiology | Admitting: Cardiology

## 2022-04-08 ENCOUNTER — Encounter (HOSPITAL_COMMUNITY): Payer: Self-pay | Admitting: Cardiology

## 2022-04-08 VITALS — BP 130/70 | HR 76 | Wt 366.2 lb

## 2022-04-08 DIAGNOSIS — I43 Cardiomyopathy in diseases classified elsewhere: Secondary | ICD-10-CM

## 2022-04-08 DIAGNOSIS — E119 Type 2 diabetes mellitus without complications: Secondary | ICD-10-CM | POA: Diagnosis not present

## 2022-04-08 DIAGNOSIS — I5032 Chronic diastolic (congestive) heart failure: Secondary | ICD-10-CM | POA: Diagnosis present

## 2022-04-08 DIAGNOSIS — I352 Nonrheumatic aortic (valve) stenosis with insufficiency: Secondary | ICD-10-CM | POA: Diagnosis not present

## 2022-04-08 DIAGNOSIS — I350 Nonrheumatic aortic (valve) stenosis: Secondary | ICD-10-CM

## 2022-04-08 DIAGNOSIS — Z7901 Long term (current) use of anticoagulants: Secondary | ICD-10-CM | POA: Insufficient documentation

## 2022-04-08 DIAGNOSIS — I2510 Atherosclerotic heart disease of native coronary artery without angina pectoris: Secondary | ICD-10-CM | POA: Diagnosis not present

## 2022-04-08 DIAGNOSIS — I428 Other cardiomyopathies: Secondary | ICD-10-CM | POA: Insufficient documentation

## 2022-04-08 DIAGNOSIS — H9319 Tinnitus, unspecified ear: Secondary | ICD-10-CM | POA: Insufficient documentation

## 2022-04-08 DIAGNOSIS — G4733 Obstructive sleep apnea (adult) (pediatric): Secondary | ICD-10-CM | POA: Diagnosis not present

## 2022-04-08 DIAGNOSIS — E785 Hyperlipidemia, unspecified: Secondary | ICD-10-CM

## 2022-04-08 DIAGNOSIS — I4821 Permanent atrial fibrillation: Secondary | ICD-10-CM | POA: Diagnosis not present

## 2022-04-08 DIAGNOSIS — I4891 Unspecified atrial fibrillation: Secondary | ICD-10-CM

## 2022-04-08 DIAGNOSIS — G5603 Carpal tunnel syndrome, bilateral upper limbs: Secondary | ICD-10-CM | POA: Diagnosis not present

## 2022-04-08 DIAGNOSIS — Z7984 Long term (current) use of oral hypoglycemic drugs: Secondary | ICD-10-CM | POA: Diagnosis not present

## 2022-04-08 DIAGNOSIS — E854 Organ-limited amyloidosis: Secondary | ICD-10-CM

## 2022-04-08 DIAGNOSIS — Z86718 Personal history of other venous thrombosis and embolism: Secondary | ICD-10-CM | POA: Insufficient documentation

## 2022-04-08 DIAGNOSIS — I5022 Chronic systolic (congestive) heart failure: Secondary | ICD-10-CM

## 2022-04-08 DIAGNOSIS — Z6841 Body Mass Index (BMI) 40.0 and over, adult: Secondary | ICD-10-CM | POA: Insufficient documentation

## 2022-04-08 DIAGNOSIS — R9431 Abnormal electrocardiogram [ECG] [EKG]: Secondary | ICD-10-CM | POA: Diagnosis not present

## 2022-04-08 DIAGNOSIS — Z79899 Other long term (current) drug therapy: Secondary | ICD-10-CM | POA: Diagnosis not present

## 2022-04-08 DIAGNOSIS — I110 Hypertensive heart disease with heart failure: Secondary | ICD-10-CM | POA: Insufficient documentation

## 2022-04-08 DIAGNOSIS — I5042 Chronic combined systolic (congestive) and diastolic (congestive) heart failure: Secondary | ICD-10-CM | POA: Diagnosis not present

## 2022-04-08 LAB — ECHOCARDIOGRAM COMPLETE
AR max vel: 0.65 cm2
AV Area VTI: 0.6 cm2
AV Area mean vel: 0.6 cm2
AV Mean grad: 25 mmHg
AV Peak grad: 42.1 mmHg
Ao pk vel: 3.24 m/s
Area-P 1/2: 3.85 cm2
S' Lateral: 4.1 cm

## 2022-04-08 LAB — CBC
HCT: 33.5 % — ABNORMAL LOW (ref 36.0–46.0)
Hemoglobin: 10.7 g/dL — ABNORMAL LOW (ref 12.0–15.0)
MCH: 29.6 pg (ref 26.0–34.0)
MCHC: 31.9 g/dL (ref 30.0–36.0)
MCV: 92.5 fL (ref 80.0–100.0)
Platelets: 227 10*3/uL (ref 150–400)
RBC: 3.62 MIL/uL — ABNORMAL LOW (ref 3.87–5.11)
RDW: 15 % (ref 11.5–15.5)
WBC: 5.3 10*3/uL (ref 4.0–10.5)
nRBC: 0 % (ref 0.0–0.2)

## 2022-04-08 LAB — BRAIN NATRIURETIC PEPTIDE: B Natriuretic Peptide: 191.9 pg/mL — ABNORMAL HIGH (ref 0.0–100.0)

## 2022-04-08 LAB — BASIC METABOLIC PANEL
Anion gap: 12 (ref 5–15)
BUN: 16 mg/dL (ref 8–23)
CO2: 26 mmol/L (ref 22–32)
Calcium: 9.6 mg/dL (ref 8.9–10.3)
Chloride: 106 mmol/L (ref 98–111)
Creatinine, Ser: 1.05 mg/dL — ABNORMAL HIGH (ref 0.44–1.00)
GFR, Estimated: 59 mL/min — ABNORMAL LOW (ref >60)
Glucose, Bld: 84 mg/dL (ref 70–99)
Potassium: 4.9 mmol/L (ref 3.5–5.1)
Sodium: 144 mmol/L (ref 135–145)

## 2022-04-08 LAB — LIPID PANEL
Cholesterol: 154 mg/dL (ref 0–200)
HDL: 51 mg/dL (ref >40)
LDL Cholesterol: 90 mg/dL (ref 0–99)
Total CHOL/HDL Ratio: 3 RATIO
Triglycerides: 63 mg/dL (ref <150)
VLDL: 13 mg/dL (ref 0–40)

## 2022-04-08 MED ORDER — TORSEMIDE 20 MG PO TABS: 60.0000 mg | ORAL_TABLET | Freq: Every day | ORAL | 3 refills | Status: DC

## 2022-04-08 MED ORDER — POTASSIUM CHLORIDE ER 10 MEQ PO TBCR: 50.0000 meq | EXTENDED_RELEASE_TABLET | Freq: Every day | ORAL | 3 refills | Status: DC

## 2022-04-08 NOTE — H&P (View-Only) (Signed)
Date:  04/08/2022   ID:  Kimberly [name], DOB 08-16-1955, MRN [id number]   Provider location: [facility] Type of Visit: Established patient   PCP:  Windell Hummingbird, PA-C  Cardiologist:  Dr. Aundra Dubin   History of Present Illness: Kimberly [name] is a 66 y.o. female who has a history of paroxysmal atrial fibrillation, obesity, and nonischemic cardiomyopathy.  She was admitted in 3/15 at [facility] with atrial fibrillation and CHF.  TEE was done in preparation for DCCV, showing EF 25-30% but there was LA thrombus so DCCV was not done.  Plan was for anticoagulation x 3 months followed by TEE-guided DCCV in the OR (due to patient's body habitus).  LHC was also done, showing mild nonobstructive CAD.  Patient was diuresed with IV Lasix in the hospital and started on apixaban.    Patient had TEE again in 6/15, showing EF 35-40% with diffuse hypokinesis and no LAA thrombus.  She was cardioverted to NSR (with difficulty).  I started her at that time on amiodarone.  She then went back into atrial fibrillation.  After she had been loaded on amiodarone, cardioverted her again in 7/15 to NSR.     She had gone back into atrial fibrillation in 1/18 and amiodarone was increased to bid in preparation for DCCV. However, she spontaneously converted to NSR.   PYP scan in 4/19 was grade 3, strongly suggestive of TTR amyloidosis.  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  Echo 8/19 showed EF 60%, moderate LVH, mild aortic stenosis.  Of note, she has carpal tunnel syndrome.  She was started on tafamidis.    She went back into atrial fibrillation and had DCCV in 2/20 but was back in atrial fibrillation a few weeks later when she went to atrial fibrillation clinic.  Dr. Rayann Heman thought that morbid obesity precluded atrial fibrillation ablation and concerns about compliance led them not to start her on Tikosyn.  Instead, it was recommended that she start on CPAP for her  OSA, lose weight, and re-attempt DCCV while on amiodarone.   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  In 7/21, she developed hematochezia and had EGD/colonoscopy which were unrevealing except for internal hemorrhoids.   Echo in 10/22 showed EF 60-65%, normal RV, moderate AS mean gradient 20 mmHg with AVA 1.56 cm^2, mild AI.   Echo was done today and reviewed, EF 55-60%, mild LVH, mildly decreased RV systolic function with normal RV size, PASP 47 mmHg, paradoxical low flow/low gradient severe AS (mean 25 mmHg, AVA 0.6 cm^2).   She returns today for followup of diastolic CHF, cardiac amyloidosis, and atrial fibrillation. She is now in permanent atrial fibrillation. Weight is up 7 lbs. She was short of breath walking into the office today.  She does ok walking around her house but is dyspneic with any moderate exertion.  She has orthopnea and raises the head of the bed. No lightheadedness/dizziness.  No chest pain.   ECG (personally reviewed): Atrial fibrillation rate 71  Labs (4/19): LFTs normal, K 4, creatinine 1.24, TSH normal, immunofixation normal Labs (8/19): TSH normal, K 3.8, creatinine 1.27, LFTs normal, TSH normal Labs (11/19): K 3.8, creatinine 1.21 Labs (2/20): LDL 114, hgb 10.5 Labs (3/20): K 4.4, creatinine 1.42 Labs (4/20): K 4.2, creatinine 1.23 Labs (7/21): K 3.8, creatinine 1.23 Labs (8/21): hgb 11.2 Labs (10/21): creatinine 2.1 Labs (5/22):  K 3.5, creatinine 1.46 Labs (8/22): K 3.4, creatinine 1.26 Labs (1/23): BNP 127, K 4, creatinine 1.4, LDL 112 Labs (4/23): Scr 1.3, K 3.7, LDL 96  Labs (7/23): BNP 110   PMH: 1. Obese 2. Atrial fibrillation: Paroxysmal => chronic.  Unable to cardiovert in 3/15 due to LAA thrombus noted on TEE. TEE in 6/15 without LAA thrombus.  Patient had cardioversion to NSR with difficulty but back in atrial fibrillation by 7/15 appt.  She was started on amiodarone  and cardioverted in 7/15.   - DCCV in 2/20 but back in atrial fibrillation by 3/20, has been in atrial fibrillation since that time.  3. H/o TKR 4. Nonischemic cardiomyopathy: TEE (3/15) with EF 25-30%, moderate MR, PA systolic pressure 51 mmHg, moderate to severe TR, LA thrombus was noted.   LHC (3/15) with 50% stenosis small PLV.  TEE (6/15) with EF 35-40%, diffuse hypokinesis, mild MR, moderate TR, +PFO, no LA appendage thrombus.  - Echo (10/15) with EF 60-65%.   - ECHO 06/10/2016 EF 60-65% Mild AS - Echo (8/19): EF 60%, moderate LVH, mild aortic stenosis.  - PYP scan in 4/19 was grade 3, strongly suggestive of transthyretin amyloidosis.  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  - Echo (11/21): EF 55-60%, mild LV dilation, RV normal, mild-moderate AS with mean gradient 17 mmHg and AVA 1.07 cm^2.  - Echo (10/22): EF 60-65%, normal RV, moderate AS mean gradient 20 mmHg with AVA 1.56 cm^2, mild AI.  - Echo (11/23): EF 55-60%, mild LVH, mildly decreased RV systolic function with normal RV size, PASP 47 mmHg, paradoxical low flow/low gradient severe AS (mean 25 mmHg, AVA 0.6 cm^2). 5. CAD: Nonobstructive.  LHC (3/15) with 50% stenosis small PLV. 6. PFO 7. Left Breast mass: Was taken for lumpectomy in 1/18 but mass had resolved.  8. Aortic stenosis: Moderate on 10/22 echo.  - Paradoxical low flow/low gradient severe AS on 11/23 echo.  9. Carpal tunnel syndrome.  10. Vestibular schwannoma s/p gamma knife surgery at [facility].  11. OSA: Not currently using CPAP.  12. Hematochezia: C-scope/EGD in 7/21 showed only internal hemorrhoids.  13. 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    Current Outpatient Medications  Medication Sig Dispense Refill   acetaminophen (TYLENOL) 650 MG CR tablet Take 1,300 mg by mouth every 8 (eight) hours as needed for pain.      benzonatate (TESSALON) 100 MG capsule Take 100 mg by mouth 3 (three) times daily as needed.     Cholecalciferol (VITAMIN D-3) 5000 UNITS  TABS Take 5,000 Units by mouth daily.     Docusate Sodium (DSS) 100 MG CAPS Take by mouth as needed.     ELIQUIS 5 MG TABS tablet TAKE 1 TABLET (5 MG TOTAL) BY MOUTH 2 (TWO) TIMES DAILY. 60 tablet 5   ezetimibe (ZETIA) 10 MG tablet Take 0.5 tablets (5 mg total) by mouth daily. 15 tablet 11   Ferrous Sulfate 27 MG TABS Take 27 mg by mouth 2 (two) times daily.      fluticasone furoate-vilanterol (BREO ELLIPTA) 100-25 MCG/INH AEPB Inhale 1 puff into the lungs every morning. As needed     JARDIANCE 10 MG TABS tablet TAKE 1 TABLET BY MOUTH DAILY BEFORE BREAKFAST. 90 tablet 3   ketoconazole (NIZORAL) 2 % cream Apply 1 application topically as needed.     metoprolol succinate (TOPROL-XL) 100 MG 24 hr tablet Take 1 tablet (100 mg total) by mouth 2 (two) times daily. 180 tablet 3   nitroGLYCERIN (  NITROSTAT) 0.4 MG SL tablet Place 1 tablet (0.4 mg total) under the tongue every 5 (five) minutes as needed for chest pain. 30 tablet 1   Omega-3 1000 MG CAPS Take 1,000 mg by mouth every morning.     RESTASIS 0.05 % ophthalmic emulsion Place 1 drop into both eyes daily as needed.     rosuvastatin (CRESTOR) 40 MG tablet Take 1 tablet (40 mg total) by mouth at bedtime. 90 tablet 3   Tafamidis Meglumine, Cardiac, (VYNDAQEL) 20 MG CAPS Take 4 capsules (80 mg)  by mouth daily. 120 capsule 11   potassium chloride (KLOR-CON) 10 MEQ tablet Take 5 tablets (50 mEq total) by mouth daily. 120 tablet 3   torsemide (DEMADEX) 20 MG tablet Take 3 tablets (60 mg total) by mouth daily. 180 tablet 3   No current facility-administered medications for this encounter.    Allergies:   Tramadol and Spironolactone   Social History:  The patient  reports that she has never smoked. She has never used smokeless tobacco. She reports that she does not drink alcohol and does not use drugs.   Family History:  The patient's family history includes CVA in her mother.   ROS:  Please see the history of present illness.   All other systems  are personally reviewed and negative.   Vitals:   BP 130/70   Pulse 76   Wt (!) 166.1 kg (366 lb 3.2 oz)   SpO2 100%   BMI 59.11 kg/m   PHYSICAL EXAM: General: NAD Neck: JVP 8-9 cm, no thyromegaly or thyroid nodule.  Lungs: Clear to auscultation bilaterally with normal respiratory effort. CV: Nondisplaced PMI.  Heart irregular S1/S2, no S3/S4, 3/6 SEM RUSB (can hear S2).  1+ ankle edema.  No carotid bruit.  Normal pedal pulses.  Abdomen: Soft, nontender, no hepatosplenomegaly, no distention.  Skin: Intact without lesions or rashes.  Neurologic: Alert and oriented x 3.  Psych: Normal affect. Extremities: No clubbing or cyanosis.  HEENT: Normal.   Recent Labs: 05/28/2021: ALT 16 11/28/2021: Magnesium 2.2 04/08/2022: B Natriuretic Peptide 191.9; BUN 16; Creatinine, Ser 1.05; Hemoglobin 10.7; Platelets 227; Potassium 4.9; Sodium 144  Personally reviewed   Wt Readings from Last 3 Encounters:  04/08/22 (!) 166.1 kg (366 lb 3.2 oz)  11/28/21 (!) 162.9 kg (359 lb 3.2 oz)  10/01/21 (!) 162.4 kg (358 lb)     ASSESSMENT AND PLAN:  1. 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  EF 35-40% on TEE in 6/15 but then EF back to 60-65% by 10/15 and remained normal on echo in 8/19 and in 10/22.  LHC without significant coronary disease in 3/15.  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  NYHA class III symptoms, she is volume overloaded on exam today.  Aortic stenosis appears to have worsened.  - Increase torsemide to 60 mg daily and increase KCl 50 mEq daily.  BMET/BNP today and BMET 10 days.  - Continue Toprol XL 100 mg BID - Continue empagliflozin 10 mg daily.  2. 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  Atrial fibrillation  and carpal tunnel syndrome, both of which she has, go along with amyloidosis.  Her history of orthostatic symptoms as well as symptoms in toes and feet consistent with peripheral neuropathy are also likely related to amyloidosis.  - Continue tafamidis.   -  Pt declined vutrisiran.  - She was seen by  Dr. 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 Given autosomal dominant condition, Shamra's daughter is at a 50% risk of inheriting the pathogenic variant in TTR (V142I). Genetic testing for the familial pathogenic variant was strongly recommended but pt's daughter declined.  2. Atrial fibrillation: Last DCCV in 2/20 but back in atrial fibrillation since then.  Dr. Rayann Heman saw, decided against atrial fibrillation ablation due to morbid obesity.  EP decided against Tikosyn due to compliance concerns. She failed amiodarone.  She remains in atrial fibrillation, I think that the atrial fibrillation is likely permanent at this point.  Rate controlled w/ ? blocker - Continue Toprol XL 100 mg bid.  - Continue Eliquis.  Denies abnormal bleeding. Check CBC today  3. CAD: Mild, nonobstructive by cath 07/2013. No chest pain.  - No ASA given Eliquis use.   - Continue crestor 40 mg daily and Zetia. Check lipids today.  4. Bilateral carpal tunnel symptoms: Likely related to amyloidosis.  5. OSA: She says that she cannot tolerate CPAP due to tinnitus.  6. HTN: BP controlled.  7. Aortic stenosis: Echo was reviewed today, she appears to have paradoxical low flow/low gradient severe AS.  This is relatively common with TTR amyloidosis.  - I will arrange for TEE to confirm severe AS.  We discussed risks/benefits and she agrees to procedure.  - I will arrange for RHC/LHC as part of pre-TAVR evaluation.  Discussed risks/benefits and she agrees to procedure.  - After the above, will refer to structural heart for TAVR evaluation.  Obesity will make groin  vascular access difficult.  8. Obesity: She would benefit from semaglutide but does not want injection.  Would recommend Rybelsus. I will refer to pharmacy clinic for this.   Recommended follow-up:  1 month APP.   Signed, [name], MD  04/08/2022  [facility] [address] [facility] [place] [id number] [phone] (office) [phone] (fax)

## 2022-04-08 NOTE — Progress Notes (Signed)
Date:  04/08/2022   ID:  [name], DOB 1955-09-22, MRN [id number]   Provider location: [facility] Type of Visit: Established patient   PCP:  Windell Hummingbird, PA-C  Cardiologist:  Dr. Aundra Dubin   History of Present Illness: Kimberly [name] is a 66 y.o. female who has a history of paroxysmal atrial fibrillation, obesity, and nonischemic cardiomyopathy.  She was admitted in 3/15 at [facility] with atrial fibrillation and CHF.  TEE was done in preparation for DCCV, showing EF 25-30% but there was LA thrombus so DCCV was not done.  Plan was for anticoagulation x 3 months followed by TEE-guided DCCV in the OR (due to patient's body habitus).  LHC was also done, showing mild nonobstructive CAD.  Patient was diuresed with IV Lasix in the hospital and started on apixaban.    Patient had TEE again in 6/15, showing EF 35-40% with diffuse hypokinesis and no LAA thrombus.  She was cardioverted to NSR (with difficulty).  I started her at that time on amiodarone.  She then went back into atrial fibrillation.  After she had been loaded on amiodarone, cardioverted her again in 7/15 to NSR.     She had gone back into atrial fibrillation in 1/18 and amiodarone was increased to bid in preparation for DCCV. However, she spontaneously converted to NSR.   PYP scan in 4/19 was grade 3, strongly suggestive of TTR amyloidosis.  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  Echo 8/19 showed EF 60%, moderate LVH, mild aortic stenosis.  Of note, she has carpal tunnel syndrome.  She was started on tafamidis.    She went back into atrial fibrillation and had DCCV in 2/20 but was back in atrial fibrillation a few weeks later when she went to atrial fibrillation clinic.  Dr. Rayann Heman thought that morbid obesity precluded atrial fibrillation ablation and concerns about compliance led them not to start her on Tikosyn.  Instead, it was recommended that she start on CPAP for her  OSA, lose weight, and re-attempt DCCV while on amiodarone.   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  In 7/21, she developed hematochezia and had EGD/colonoscopy which were unrevealing except for internal hemorrhoids.   Echo in 10/22 showed EF 60-65%, normal RV, moderate AS mean gradient 20 mmHg with AVA 1.56 cm^2, mild AI.   Echo was done today and reviewed, EF 55-60%, mild LVH, mildly decreased RV systolic function with normal RV size, PASP 47 mmHg, paradoxical low flow/low gradient severe AS (mean 25 mmHg, AVA 0.6 cm^2).   She returns today for followup of diastolic CHF, cardiac amyloidosis, and atrial fibrillation. She is now in permanent atrial fibrillation. Weight is up 7 lbs. She was short of breath walking into the office today.  She does ok walking around her house but is dyspneic with any moderate exertion.  She has orthopnea and raises the head of the bed. No lightheadedness/dizziness.  No chest pain.   ECG (personally reviewed): Atrial fibrillation rate 71  Labs (4/19): LFTs normal, K 4, creatinine 1.24, TSH normal, immunofixation normal Labs (8/19): TSH normal, K 3.8, creatinine 1.27, LFTs normal, TSH normal Labs (11/19): K 3.8, creatinine 1.21 Labs (2/20): LDL 114, hgb 10.5 Labs (3/20): K 4.4, creatinine 1.42 Labs (4/20): K 4.2, creatinine 1.23 Labs (7/21): K 3.8, creatinine 1.23 Labs (8/21): hgb 11.2 Labs (10/21): creatinine 2.1 Labs (5/22):  K 3.5, creatinine 1.46 Labs (8/22): K 3.4, creatinine 1.26 Labs (1/23): BNP 127, K 4, creatinine 1.4, LDL 112 Labs (4/23): Scr 1.3, K 3.7, LDL 96  Labs (7/23): BNP 110   PMH: 1. Obese 2. Atrial fibrillation: Paroxysmal => chronic.  Unable to cardiovert in 3/15 due to LAA thrombus noted on TEE. TEE in 6/15 without LAA thrombus.  Patient had cardioversion to NSR with difficulty but back in atrial fibrillation by 7/15 appt.  She was started on amiodarone  and cardioverted in 7/15.   - DCCV in 2/20 but back in atrial fibrillation by 3/20, has been in atrial fibrillation since that time.  3. H/o TKR 4. Nonischemic cardiomyopathy: TEE (3/15) with EF 25-30%, moderate MR, PA systolic pressure 51 mmHg, moderate to severe TR, LA thrombus was noted.   LHC (3/15) with 50% stenosis small PLV.  TEE (6/15) with EF 35-40%, diffuse hypokinesis, mild MR, moderate TR, +PFO, no LA appendage thrombus.  - Echo (10/15) with EF 60-65%.   - ECHO 06/10/2016 EF 60-65% Mild AS - Echo (8/19): EF 60%, moderate LVH, mild aortic stenosis.  - PYP scan in 4/19 was grade 3, strongly suggestive of transthyretin amyloidosis.  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  - Echo (11/21): EF 55-60%, mild LV dilation, RV normal, mild-moderate AS with mean gradient 17 mmHg and AVA 1.07 cm^2.  - Echo (10/22): EF 60-65%, normal RV, moderate AS mean gradient 20 mmHg with AVA 1.56 cm^2, mild AI.  - Echo (11/23): EF 55-60%, mild LVH, mildly decreased RV systolic function with normal RV size, PASP 47 mmHg, paradoxical low flow/low gradient severe AS (mean 25 mmHg, AVA 0.6 cm^2). 5. CAD: Nonobstructive.  LHC (3/15) with 50% stenosis small PLV. 6. PFO 7. Left Breast mass: Was taken for lumpectomy in 1/18 but mass had resolved.  8. Aortic stenosis: Moderate on 10/22 echo.  - Paradoxical low flow/low gradient severe AS on 11/23 echo.  9. Carpal tunnel syndrome.  10. Vestibular schwannoma s/p gamma knife surgery at [facility].  11. OSA: Not currently using CPAP.  12. Hematochezia: C-scope/EGD in 7/21 showed only internal hemorrhoids.  13. 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    Current Outpatient Medications  Medication Sig Dispense Refill   acetaminophen (TYLENOL) 650 MG CR tablet Take 1,300 mg by mouth every 8 (eight) hours as needed for pain.      benzonatate (TESSALON) 100 MG capsule Take 100 mg by mouth 3 (three) times daily as needed.     Cholecalciferol (VITAMIN D-3) 5000 UNITS  TABS Take 5,000 Units by mouth daily.     Docusate Sodium (DSS) 100 MG CAPS Take by mouth as needed.     ELIQUIS 5 MG TABS tablet TAKE 1 TABLET (5 MG TOTAL) BY MOUTH 2 (TWO) TIMES DAILY. 60 tablet 5   ezetimibe (ZETIA) 10 MG tablet Take 0.5 tablets (5 mg total) by mouth daily. 15 tablet 11   Ferrous Sulfate 27 MG TABS Take 27 mg by mouth 2 (two) times daily.      fluticasone furoate-vilanterol (BREO ELLIPTA) 100-25 MCG/INH AEPB Inhale 1 puff into the lungs every morning. As needed     JARDIANCE 10 MG TABS tablet TAKE 1 TABLET BY MOUTH DAILY BEFORE BREAKFAST. 90 tablet 3   ketoconazole (NIZORAL) 2 % cream Apply 1 application topically as needed.     metoprolol succinate (TOPROL-XL) 100 MG 24 hr tablet Take 1 tablet (100 mg total) by mouth 2 (two) times daily. 180 tablet 3   nitroGLYCERIN (  NITROSTAT) 0.4 MG SL tablet Place 1 tablet (0.4 mg total) under the tongue every 5 (five) minutes as needed for chest pain. 30 tablet 1   Omega-3 1000 MG CAPS Take 1,000 mg by mouth every morning.     RESTASIS 0.05 % ophthalmic emulsion Place 1 drop into both eyes daily as needed.     rosuvastatin (CRESTOR) 40 MG tablet Take 1 tablet (40 mg total) by mouth at bedtime. 90 tablet 3   Tafamidis Meglumine, Cardiac, (VYNDAQEL) 20 MG CAPS Take 4 capsules (80 mg)  by mouth daily. 120 capsule 11   potassium chloride (KLOR-CON) 10 MEQ tablet Take 5 tablets (50 mEq total) by mouth daily. 120 tablet 3   torsemide (DEMADEX) 20 MG tablet Take 3 tablets (60 mg total) by mouth daily. 180 tablet 3   No current facility-administered medications for this encounter.    Allergies:   Tramadol and Spironolactone   Social History:  The patient  reports that she has never smoked. She has never used smokeless tobacco. She reports that she does not drink alcohol and does not use drugs.   Family History:  The patient's family history includes CVA in her mother.   ROS:  Please see the history of present illness.   All other systems  are personally reviewed and negative.   Vitals:   BP 130/70   Pulse 76   Wt (!) 166.1 kg (366 lb 3.2 oz)   SpO2 100%   BMI 59.11 kg/m   PHYSICAL EXAM: General: NAD Neck: JVP 8-9 cm, no thyromegaly or thyroid nodule.  Lungs: Clear to auscultation bilaterally with normal respiratory effort. CV: Nondisplaced PMI.  Heart irregular S1/S2, no S3/S4, 3/6 SEM RUSB (can hear S2).  1+ ankle edema.  No carotid bruit.  Normal pedal pulses.  Abdomen: Soft, nontender, no hepatosplenomegaly, no distention.  Skin: Intact without lesions or rashes.  Neurologic: Alert and oriented x 3.  Psych: Normal affect. Extremities: No clubbing or cyanosis.  HEENT: Normal.   Recent Labs: 05/28/2021: ALT 16 11/28/2021: Magnesium 2.2 04/08/2022: B Natriuretic Peptide 191.9; BUN 16; Creatinine, Ser 1.05; Hemoglobin 10.7; Platelets 227; Potassium 4.9; Sodium 144  Personally reviewed   Wt Readings from Last 3 Encounters:  04/08/22 (!) 166.1 kg (366 lb 3.2 oz)  11/28/21 (!) 162.9 kg (359 lb 3.2 oz)  10/01/21 (!) 162.4 kg (358 lb)     ASSESSMENT AND PLAN:  1. 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  EF 35-40% on TEE in 6/15 but then EF back to 60-65% by 10/15 and remained normal on echo in 8/19 and in 10/22.  LHC without significant coronary disease in 3/15.  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  NYHA class III symptoms, she is volume overloaded on exam today.  Aortic stenosis appears to have worsened.  - Increase torsemide to 60 mg daily and increase KCl 50 mEq daily.  BMET/BNP today and BMET 10 days.  - Continue Toprol XL 100 mg BID - Continue empagliflozin 10 mg daily.  2. 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  Atrial fibrillation  and carpal tunnel syndrome, both of which she has, go along with amyloidosis.  Her history of orthostatic symptoms as well as symptoms in toes and feet consistent with peripheral neuropathy are also likely related to amyloidosis.  - Continue tafamidis.   -  Pt declined vutrisiran.  - She was seen by  Dr. 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 Given autosomal dominant condition, Annalese's daughter is at a 50% risk of inheriting the pathogenic variant in TTR (V142I). Genetic testing for the familial pathogenic variant was strongly recommended but pt's daughter declined.  2. Atrial fibrillation: Last DCCV in 2/20 but back in atrial fibrillation since then.  Dr. Rayann Heman saw, decided against atrial fibrillation ablation due to morbid obesity.  EP decided against Tikosyn due to compliance concerns. She failed amiodarone.  She remains in atrial fibrillation, I think that the atrial fibrillation is likely permanent at this point.  Rate controlled w/ ? blocker - Continue Toprol XL 100 mg bid.  - Continue Eliquis.  Denies abnormal bleeding. Check CBC today  3. CAD: Mild, nonobstructive by cath 07/2013. No chest pain.  - No ASA given Eliquis use.   - Continue crestor 40 mg daily and Zetia. Check lipids today.  4. Bilateral carpal tunnel symptoms: Likely related to amyloidosis.  5. OSA: She says that she cannot tolerate CPAP due to tinnitus.  6. HTN: BP controlled.  7. Aortic stenosis: Echo was reviewed today, she appears to have paradoxical low flow/low gradient severe AS.  This is relatively common with TTR amyloidosis.  - I will arrange for TEE to confirm severe AS.  We discussed risks/benefits and she agrees to procedure.  - I will arrange for RHC/LHC as part of pre-TAVR evaluation.  Discussed risks/benefits and she agrees to procedure.  - After the above, will refer to structural heart for TAVR evaluation.  Obesity will make groin  vascular access difficult.  8. Obesity: She would benefit from semaglutide but does not want injection.  Would recommend Rybelsus. I will refer to pharmacy clinic for this.   Recommended follow-up:  1 month APP.   Signed, [name], MD  04/08/2022  [facility] [address] [facility] [place] [address] [phone] (office) [phone] (fax)

## 2022-04-08 NOTE — Patient Instructions (Signed)
INCREASE Torsemide to 60 mg daily.  INCREASE Potassium to 50 meq daily.  Labs done today, your results will be available in MyChart, we will contact you for abnormal readings.   You are scheduled for a Cardiac Catheterization on Monday, November 27 with Dr. [name].  1. Please arrive at the [facility] (Main Entrance A) at [facility]: [address] [place], [place] [address] at 6:30 AM (This time is two hours before your procedure to ensure your preparation). Free valet parking service is available.   Special note: Every effort is made to have your procedure done on time. Please understand that emergencies sometimes delay scheduled procedures.  2. Diet: Do not eat solid foods after midnight.  The patient may have clear liquids until 5am upon the day of the procedure.  3. Medication instructions in preparation for your procedure:   Contrast Allergy: No   Stop taking Eliquis (Apixiban) on Sunday, November 26.  Stop taking, Torsemide (Demadex) Monday, November 27,  Stop taking Jardiance Monday, November 27   On the morning of your procedure, take your  morning medicines NOT listed above.  You may use sips of water.  5. Plan for one night stay--bring personal belongings. 6. Bring a current list of your medications and current insurance cards. 7. You MUST have a responsible person to drive you home. 8. Someone MUST be with you the first 24 hours after you arrive home or your discharge will be delayed. 9. Please wear clothes that are easy to get on and off and wear slip-on shoes.    You are scheduled for a TEE (Transesophageal Echocardiogram) on Monday, November 27 with Dr. Aundra Dubin.   Nothing to eat or drink after midnight except a sip of water with medications (see medication instructions below)   FYI:  For your safety, and to allow [place] to monitor your vital signs accurately during the surgery/procedure we request: If you have artificial nails, gel coating, SNS  etc, please have those removed prior to your surgery/procedure. Not having the nail coverings /polish removed may result in cancellation or delay of your surgery/procedure.  You must have a responsible person to drive you home and stay in the waiting area during your procedure. Failure to do so could result in cancellation.  Bring your insurance cards.  *Special Note: Every effort is made to have your procedure done on time. Occasionally there are emergencies that occur at the hospital that may cause delays. Please be patient if a delay does occur.     Your physician recommends that you schedule a follow-up appointment in: 1 month  If you have any questions or concerns before your next appointment please send [place] a message through [place] or call our office at [phone].    TO LEAVE A MESSAGE FOR THE NURSE SELECT OPTION 2, PLEASE LEAVE A MESSAGE INCLUDING: YOUR NAME DATE OF BIRTH CALL BACK NUMBER REASON FOR CALL**this is important as we prioritize the call backs  YOU WILL RECEIVE A CALL BACK THE SAME DAY AS LONG AS YOU CALL BEFORE 4:00 PM  At the [facility], you and your health needs are our priority. As part of our continuing mission to provide you with exceptional heart care, we have created designated Provider Care Teams. These Care Teams include your primary Cardiologist (physician) and Advanced Practice Providers (APPs- Physician Assistants and Nurse Practitioners) who all work together to provide you with the care you need, when you need it.   You may  see any of the following providers on your designated Care Team at your next follow up: Dr Glori Bickers Dr [name] Dr. Roxana Hires, NP Lyda Jester, [place] [facility] [place], [place] Forestine Na, NP Audry Riles, PharmD   Please be sure to bring in all your medications bottles to every appointment.

## 2022-04-09 ENCOUNTER — Telehealth (HOSPITAL_COMMUNITY): Payer: Self-pay | Admitting: *Deleted

## 2022-04-13 NOTE — Anesthesia Preprocedure Evaluation (Addendum)
Anesthesia Evaluation  Patient identified by MRN, date of birth, ID band Patient awake    Reviewed: Allergy & Precautions, NPO status , Patient's Chart, lab work & pertinent test results, reviewed documented beta blocker date and time   History of Anesthesia Complications (+) PONV and history of anesthetic complications  Airway Mallampati: I  TM Distance: >3 FB Neck ROM: Full    Dental  (+) Loose, Missing, Dental Advisory Given,    Pulmonary sleep apnea    Pulmonary exam normal breath sounds clear to auscultation       Cardiovascular hypertension, Pt. on home beta blockers and Pt. on medications pulmonary hypertension+CHF  Normal cardiovascular exam+ dysrhythmias Atrial Fibrillation + Valvular Problems/Murmurs (severe AS, PFO) AS  Rhythm:Irregular Rate:Normal  TTE 2023  1. Left ventricular ejection fraction, by estimation, is 55 to 60%. The  left ventricle has normal function. The left ventricle has no regional  wall motion abnormalities. There is mild concentric left ventricular  hypertrophy. Left ventricular diastolic  parameters are indeterminate.   2. Right ventricular systolic function is mildly reduced. The right  ventricular size is normal. There is moderately elevated pulmonary artery  systolic pressure. The estimated right ventricular systolic pressure is  50.0 mmHg.   3. Left atrial size was mildly dilated.   4. The mitral valve is normal in structure. Trivial mitral valve  regurgitation. No evidence of mitral stenosis.   5. The aortic valve is tricuspid. Aortic valve regurgitation is mild.  Severe aortic valve stenosis. Aortic valve area, by VTI measures 0.60 cm.  Aortic valve mean gradient measures 25.0 mmHg.   6. The inferior vena cava is dilated in size with >50% respiratory  variability, suggesting right atrial pressure of 8 mmHg.     Neuro/Psych negative neurological ROS  negative psych ROS    GI/Hepatic negative GI ROS, Neg liver ROS,,,  Endo/Other  diabetes, Oral Hypoglycemic Agents  Morbid obesity (BMI 59)  Renal/GU negative Renal ROS  negative genitourinary   Musculoskeletal negative musculoskeletal ROS (+)    Abdominal   Peds  Hematology negative hematology ROS (+)   Anesthesia Other Findings   Reproductive/Obstetrics                             Anesthesia Physical Anesthesia Plan  ASA: 4  Anesthesia Plan: MAC   Post-op Pain Management:    Induction: Intravenous  PONV Risk Score and Plan: Propofol infusion and Treatment may vary due to age or medical condition  Airway Management Planned: Natural Airway  Additional Equipment:   Intra-op Plan:   Post-operative Plan:   Informed Consent: I have reviewed the patients History and Physical, chart, labs and discussed the procedure including the risks, benefits and alternatives for the proposed anesthesia with the patient or authorized representative who has indicated his/her understanding and acceptance.     Dental advisory given  Plan Discussed with: CRNA  Anesthesia Plan Comments:        Anesthesia Quick Evaluation

## 2022-04-14 ENCOUNTER — Ambulatory Visit (HOSPITAL_COMMUNITY): Payer: Medicare HMO | Admitting: Anesthesiology

## 2022-04-14 ENCOUNTER — Encounter (HOSPITAL_COMMUNITY)
Admission: RE | Disposition: A | Discharge status: Home / Self Care | Payer: Self-pay | Source: Home / Self Care | Attending: Cardiology

## 2022-04-14 ENCOUNTER — Encounter (HOSPITAL_COMMUNITY): Payer: Self-pay | Admitting: Cardiology

## 2022-04-14 ENCOUNTER — Ambulatory Visit (HOSPITAL_BASED_OUTPATIENT_CLINIC_OR_DEPARTMENT_OTHER)
Admission: RE | Admit: 2022-04-14 | Discharge: 2022-04-14 | Disposition: A | Discharge status: Home / Self Care | Payer: Medicare HMO | Source: Home / Self Care | Attending: Cardiology | Admitting: Cardiology

## 2022-04-14 ENCOUNTER — Other Ambulatory Visit: Payer: Self-pay

## 2022-04-14 ENCOUNTER — Ambulatory Visit (HOSPITAL_BASED_OUTPATIENT_CLINIC_OR_DEPARTMENT_OTHER): Payer: Medicare HMO | Admitting: Anesthesiology

## 2022-04-14 ENCOUNTER — Ambulatory Visit (HOSPITAL_COMMUNITY)
Admission: RE | Disposition: A | Discharge status: Home / Self Care | Payer: Self-pay | Source: Home / Self Care | Attending: Cardiology

## 2022-04-14 ENCOUNTER — Observation Stay (HOSPITAL_COMMUNITY)
Admission: RE | Admit: 2022-04-14 | Discharge: 2022-04-16 | Disposition: A | Discharge status: Home / Self Care | Payer: Medicare HMO | Attending: Cardiology | Admitting: Cardiology

## 2022-04-14 DIAGNOSIS — I509 Heart failure, unspecified: Secondary | ICD-10-CM

## 2022-04-14 DIAGNOSIS — I5043 Acute on chronic combined systolic (congestive) and diastolic (congestive) heart failure: Secondary | ICD-10-CM

## 2022-04-14 DIAGNOSIS — I272 Pulmonary hypertension, unspecified: Secondary | ICD-10-CM | POA: Insufficient documentation

## 2022-04-14 DIAGNOSIS — I083 Combined rheumatic disorders of mitral, aortic and tricuspid valves: Secondary | ICD-10-CM | POA: Insufficient documentation

## 2022-04-14 DIAGNOSIS — G5603 Carpal tunnel syndrome, bilateral upper limbs: Secondary | ICD-10-CM | POA: Insufficient documentation

## 2022-04-14 DIAGNOSIS — Z6841 Body Mass Index (BMI) 40.0 and over, adult: Secondary | ICD-10-CM | POA: Diagnosis not present

## 2022-04-14 DIAGNOSIS — I110 Hypertensive heart disease with heart failure: Secondary | ICD-10-CM

## 2022-04-14 DIAGNOSIS — I4891 Unspecified atrial fibrillation: Secondary | ICD-10-CM

## 2022-04-14 DIAGNOSIS — G4733 Obstructive sleep apnea (adult) (pediatric): Secondary | ICD-10-CM | POA: Insufficient documentation

## 2022-04-14 DIAGNOSIS — E854 Organ-limited amyloidosis: Secondary | ICD-10-CM | POA: Diagnosis not present

## 2022-04-14 DIAGNOSIS — I5033 Acute on chronic diastolic (congestive) heart failure: Secondary | ICD-10-CM | POA: Diagnosis not present

## 2022-04-14 DIAGNOSIS — I350 Nonrheumatic aortic (valve) stenosis: Secondary | ICD-10-CM

## 2022-04-14 DIAGNOSIS — I4821 Permanent atrial fibrillation: Secondary | ICD-10-CM | POA: Diagnosis not present

## 2022-04-14 DIAGNOSIS — Z79899 Other long term (current) drug therapy: Secondary | ICD-10-CM | POA: Diagnosis not present

## 2022-04-14 DIAGNOSIS — Z7901 Long term (current) use of anticoagulants: Secondary | ICD-10-CM | POA: Diagnosis not present

## 2022-04-14 DIAGNOSIS — I43 Cardiomyopathy in diseases classified elsewhere: Secondary | ICD-10-CM | POA: Insufficient documentation

## 2022-04-14 DIAGNOSIS — Q2112 Patent foramen ovale: Secondary | ICD-10-CM | POA: Diagnosis not present

## 2022-04-14 DIAGNOSIS — I5022 Chronic systolic (congestive) heart failure: Secondary | ICD-10-CM

## 2022-04-14 DIAGNOSIS — I2510 Atherosclerotic heart disease of native coronary artery without angina pectoris: Secondary | ICD-10-CM | POA: Diagnosis not present

## 2022-04-14 DIAGNOSIS — I428 Other cardiomyopathies: Secondary | ICD-10-CM | POA: Diagnosis not present

## 2022-04-14 HISTORY — PX: RIGHT/LEFT HEART CATH AND CORONARY ANGIOGRAPHY: CATH118266

## 2022-04-14 HISTORY — PX: TEE WITHOUT CARDIOVERSION: SHX5443

## 2022-04-14 LAB — COMPREHENSIVE METABOLIC PANEL
ALT: 14 U/L (ref 0–44)
AST: 21 U/L (ref 15–41)
Albumin: 4.2 g/dL (ref 3.5–5.0)
Alkaline Phosphatase: 99 U/L (ref 38–126)
Anion gap: 11 (ref 5–15)
BUN: 21 mg/dL (ref 8–23)
CO2: 26 mmol/L (ref 22–32)
Calcium: 9.6 mg/dL (ref 8.9–10.3)
Chloride: 104 mmol/L (ref 98–111)
Creatinine, Ser: 1.21 mg/dL — ABNORMAL HIGH (ref 0.44–1.00)
GFR, Estimated: 50 mL/min — ABNORMAL LOW (ref >60)
Glucose, Bld: 103 mg/dL — ABNORMAL HIGH (ref 70–99)
Potassium: 3.8 mmol/L (ref 3.5–5.1)
Sodium: 141 mmol/L (ref 135–145)
Total Bilirubin: 0.8 mg/dL (ref 0.3–1.2)
Total Protein: 8.2 g/dL — ABNORMAL HIGH (ref 6.5–8.1)

## 2022-04-14 LAB — CBC WITH DIFFERENTIAL/PLATELET
Abs Immature Granulocytes: 0.01 10*3/uL (ref 0.00–0.07)
Basophils Absolute: 0 10*3/uL (ref 0.0–0.1)
Basophils Relative: 0 %
Eosinophils Absolute: 0 10*3/uL (ref 0.0–0.5)
Eosinophils Relative: 1 %
HCT: 36.8 % (ref 36.0–46.0)
Hemoglobin: 11.9 g/dL — ABNORMAL LOW (ref 12.0–15.0)
Immature Granulocytes: 0 %
Lymphocytes Relative: 26 %
Lymphs Abs: 1.3 10*3/uL (ref 0.7–4.0)
MCH: 29.3 pg (ref 26.0–34.0)
MCHC: 32.3 g/dL (ref 30.0–36.0)
MCV: 90.6 fL (ref 80.0–100.0)
Monocytes Absolute: 0.6 10*3/uL (ref 0.1–1.0)
Monocytes Relative: 12 %
Neutro Abs: 3.1 10*3/uL (ref 1.7–7.7)
Neutrophils Relative %: 61 %
Platelets: 227 10*3/uL (ref 150–400)
RBC: 4.06 MIL/uL (ref 3.87–5.11)
RDW: 15.1 % (ref 11.5–15.5)
WBC: 5.2 10*3/uL (ref 4.0–10.5)
nRBC: 0 % (ref 0.0–0.2)

## 2022-04-14 LAB — TSH: TSH: 0.932 u[IU]/mL (ref 0.350–4.500)

## 2022-04-14 LAB — ECHO TEE
AR max vel: 0.83 cm2
AV Area VTI: 0.86 cm2
AV Area mean vel: 0.81 cm2
AV Mean grad: 40 mmHg
AV Peak grad: 60.2 mmHg
Ao pk vel: 3.88 m/s
P 1/2 time: 376 msec

## 2022-04-14 LAB — POCT I-STAT EG7
Acid-Base Excess: 2 mmol/L (ref 0.0–2.0)
Bicarbonate: 28.8 mmol/L — ABNORMAL HIGH (ref 20.0–28.0)
Calcium, Ion: 1.26 mmol/L (ref 1.15–1.40)
HCT: 33 % — ABNORMAL LOW (ref 36.0–46.0)
Hemoglobin: 11.2 g/dL — ABNORMAL LOW (ref 12.0–15.0)
O2 Saturation: 61 %
Potassium: 3.8 mmol/L (ref 3.5–5.1)
Sodium: 142 mmol/L (ref 135–145)
TCO2: 30 mmol/L (ref 22–32)
pCO2, Ven: 53.3 mmHg (ref 44–60)
pH, Ven: 7.341 (ref 7.25–7.43)
pO2, Ven: 34 mmHg (ref 32–45)

## 2022-04-14 LAB — HIV ANTIBODY (ROUTINE TESTING W REFLEX): HIV Screen 4th Generation wRfx: NONREACTIVE

## 2022-04-14 LAB — BRAIN NATRIURETIC PEPTIDE: B Natriuretic Peptide: 144.6 pg/mL — ABNORMAL HIGH (ref 0.0–100.0)

## 2022-04-14 SURGERY — ECHOCARDIOGRAM, TRANSESOPHAGEAL
Anesthesia: Monitor Anesthesia Care

## 2022-04-14 SURGERY — RIGHT/LEFT HEART CATH AND CORONARY ANGIOGRAPHY
Anesthesia: LOCAL

## 2022-04-14 MED ORDER — ACETAMINOPHEN ER 650 MG PO TBCR: 1300.0000 mg | EXTENDED_RELEASE_TABLET | Freq: Three times a day (TID) | ORAL | Status: DC | PRN

## 2022-04-14 MED ORDER — APIXABAN 5 MG PO TABS
5.0000 mg | ORAL_TABLET | Freq: Two times a day (BID) | ORAL | Status: DC
  Administered 2022-04-14 – 2022-04-16 (×4): 5 mg via ORAL
  Filled 2022-04-14 (×4): qty 1

## 2022-04-14 MED ORDER — APIXABAN 5 MG PO TABS: 5.0000 mg | ORAL_TABLET | Freq: Two times a day (BID) | ORAL | Status: DC

## 2022-04-14 MED ORDER — LIDOCAINE HCL (PF) 1 % IJ SOLN
INTRAMUSCULAR | Status: AC
  Filled 2022-04-14: qty 30

## 2022-04-14 MED ORDER — LIDOCAINE HCL (PF) 1 % IJ SOLN
INTRAMUSCULAR | Status: DC | PRN
  Administered 2022-04-14: 5 mL
  Administered 2022-04-14: 2 mL

## 2022-04-14 MED ORDER — SODIUM CHLORIDE 0.9 % IV SOLN: INTRAVENOUS | Status: DC

## 2022-04-14 MED ORDER — OMEGA-3-ACID ETHYL ESTERS 1 G PO CAPS
1000.0000 mg | ORAL_CAPSULE | Freq: Every morning | ORAL | Status: DC
  Administered 2022-04-14 – 2022-04-16 (×3): 1000 mg via ORAL
  Filled 2022-04-14 (×3): qty 1

## 2022-04-14 MED ORDER — HEPARIN SODIUM (PORCINE) 1000 UNIT/ML IJ SOLN
INTRAMUSCULAR | Status: DC | PRN
  Administered 2022-04-14: 6000 [IU] via INTRAVENOUS

## 2022-04-14 MED ORDER — FERROUS SULFATE 325 (65 FE) MG PO TABS
325.0000 mg | ORAL_TABLET | Freq: Two times a day (BID) | ORAL | Status: DC
  Administered 2022-04-14 – 2022-04-16 (×5): 325 mg via ORAL
  Filled 2022-04-14 (×5): qty 1

## 2022-04-14 MED ORDER — FENTANYL CITRATE (PF) 100 MCG/2ML IJ SOLN
INTRAMUSCULAR | Status: AC
  Filled 2022-04-14: qty 2

## 2022-04-14 MED ORDER — FENTANYL CITRATE (PF) 100 MCG/2ML IJ SOLN
INTRAMUSCULAR | Status: DC | PRN
  Administered 2022-04-14 (×2): 25 ug via INTRAVENOUS

## 2022-04-14 MED ORDER — ROSUVASTATIN CALCIUM 20 MG PO TABS
40.0000 mg | ORAL_TABLET | Freq: Every day | ORAL | Status: DC
  Administered 2022-04-14 – 2022-04-15 (×2): 40 mg via ORAL
  Filled 2022-04-14 (×2): qty 2

## 2022-04-14 MED ORDER — SODIUM CHLORIDE 0.9% FLUSH
3.0000 mL | Freq: Two times a day (BID) | INTRAVENOUS | Status: DC
  Administered 2022-04-14 – 2022-04-15 (×3): 3 mL via INTRAVENOUS

## 2022-04-14 MED ORDER — VERAPAMIL HCL 2.5 MG/ML IV SOLN
INTRAVENOUS | Status: DC | PRN
  Administered 2022-04-14: 10 mL via INTRA_ARTERIAL

## 2022-04-14 MED ORDER — LABETALOL HCL 5 MG/ML IV SOLN: 10.0000 mg | INTRAVENOUS | Status: AC | PRN

## 2022-04-14 MED ORDER — PROPOFOL 500 MG/50ML IV EMUL
INTRAVENOUS | Status: DC | PRN
  Administered 2022-04-14: 100 ug/kg/min via INTRAVENOUS

## 2022-04-14 MED ORDER — SODIUM CHLORIDE 0.9% FLUSH: 3.0000 mL | INTRAVENOUS | Status: DC | PRN

## 2022-04-14 MED ORDER — ACETAMINOPHEN 325 MG PO TABS: 650.0000 mg | ORAL_TABLET | ORAL | Status: DC | PRN

## 2022-04-14 MED ORDER — ONDANSETRON HCL 4 MG/2ML IJ SOLN: 4.0000 mg | Freq: Four times a day (QID) | INTRAMUSCULAR | Status: DC | PRN

## 2022-04-14 MED ORDER — TAFAMIDIS MEGLUMINE (CARDIAC) 20 MG PO CAPS
80.0000 mg | ORAL_CAPSULE | Freq: Every day | ORAL | Status: DC
  Filled 2022-04-14 (×2): qty 1

## 2022-04-14 MED ORDER — HEPARIN SODIUM (PORCINE) 1000 UNIT/ML IJ SOLN
INTRAMUSCULAR | Status: AC
  Filled 2022-04-14: qty 10

## 2022-04-14 MED ORDER — SODIUM CHLORIDE 0.9 % IV SOLN: 250.0000 mL | INTRAVENOUS | Status: DC | PRN

## 2022-04-14 MED ORDER — METOPROLOL TARTRATE 5 MG/5ML IV SOLN
INTRAVENOUS | Status: AC
  Filled 2022-04-14: qty 5

## 2022-04-14 MED ORDER — SODIUM CHLORIDE 0.9% FLUSH
3.0000 mL | Freq: Two times a day (BID) | INTRAVENOUS | Status: DC
  Administered 2022-04-14: 3 mL via INTRAVENOUS

## 2022-04-14 MED ORDER — HEPARIN (PORCINE) IN NACL 1000-0.9 UT/500ML-% IV SOLN
INTRAVENOUS | Status: AC
  Filled 2022-04-14: qty 1000

## 2022-04-14 MED ORDER — SODIUM CHLORIDE 0.9% FLUSH
3.0000 mL | Freq: Two times a day (BID) | INTRAVENOUS | Status: DC
  Administered 2022-04-14 (×2): 3 mL via INTRAVENOUS

## 2022-04-14 MED ORDER — KETAMINE HCL 50 MG/5ML IJ SOSY
PREFILLED_SYRINGE | INTRAMUSCULAR | Status: AC
  Filled 2022-04-14: qty 5

## 2022-04-14 MED ORDER — METOPROLOL SUCCINATE ER 100 MG PO TB24
100.0000 mg | ORAL_TABLET | Freq: Two times a day (BID) | ORAL | Status: DC
  Administered 2022-04-14 – 2022-04-16 (×5): 100 mg via ORAL
  Filled 2022-04-14 (×5): qty 1

## 2022-04-14 MED ORDER — VITAMIN D 25 MCG (1000 UNIT) PO TABS
5000.0000 [IU] | ORAL_TABLET | Freq: Every day | ORAL | Status: DC
  Administered 2022-04-14 – 2022-04-16 (×3): 5000 [IU] via ORAL
  Filled 2022-04-14 (×3): qty 5

## 2022-04-14 MED ORDER — VERAPAMIL HCL 2.5 MG/ML IV SOLN
INTRAVENOUS | Status: AC
  Filled 2022-04-14: qty 2

## 2022-04-14 MED ORDER — EMPAGLIFLOZIN 10 MG PO TABS
10.0000 mg | ORAL_TABLET | Freq: Every day | ORAL | Status: DC
  Administered 2022-04-15 – 2022-04-16 (×2): 10 mg via ORAL
  Filled 2022-04-14 (×2): qty 1

## 2022-04-14 MED ORDER — MIDAZOLAM HCL 2 MG/2ML IJ SOLN
INTRAMUSCULAR | Status: DC | PRN
  Administered 2022-04-14 (×2): 1 mg via INTRAVENOUS

## 2022-04-14 MED ORDER — EZETIMIBE 10 MG PO TABS
5.0000 mg | ORAL_TABLET | Freq: Every day | ORAL | Status: DC
  Administered 2022-04-14 – 2022-04-16 (×3): 5 mg via ORAL
  Filled 2022-04-14 (×3): qty 1

## 2022-04-14 MED ORDER — HEPARIN (PORCINE) IN NACL 1000-0.9 UT/500ML-% IV SOLN
INTRAVENOUS | Status: DC | PRN
  Administered 2022-04-14 (×2): 500 mL

## 2022-04-14 MED ORDER — MIDAZOLAM HCL 2 MG/2ML IJ SOLN
INTRAMUSCULAR | Status: AC
  Filled 2022-04-14: qty 2

## 2022-04-14 MED ORDER — ACETAMINOPHEN 325 MG PO TABS
650.0000 mg | ORAL_TABLET | ORAL | Status: DC | PRN
  Administered 2022-04-14: 650 mg via ORAL
  Filled 2022-04-14: qty 2

## 2022-04-14 MED ORDER — PROPOFOL 10 MG/ML IV BOLUS
INTRAVENOUS | Status: DC | PRN
  Administered 2022-04-14 (×3): 10 mg via INTRAVENOUS

## 2022-04-14 MED ORDER — METOPROLOL TARTRATE 5 MG/5ML IV SOLN
5.0000 mg | Freq: Once | INTRAVENOUS | Status: AC
  Administered 2022-04-14: 5 mg via INTRAVENOUS

## 2022-04-14 MED ORDER — HYDRALAZINE HCL 20 MG/ML IJ SOLN: 10.0000 mg | INTRAMUSCULAR | Status: AC | PRN

## 2022-04-14 MED ORDER — FUROSEMIDE 10 MG/ML IJ SOLN
80.0000 mg | Freq: Two times a day (BID) | INTRAMUSCULAR | Status: DC
  Administered 2022-04-14 – 2022-04-15 (×3): 80 mg via INTRAVENOUS
  Filled 2022-04-14 (×3): qty 8

## 2022-04-14 SURGICAL SUPPLY — 13 items
CATH 5FR JL3.5 JR4 ANG PIG MP (CATHETERS) IMPLANT
CATH INFINITI 5 FR 3DRC (CATHETERS) IMPLANT
CATH SWAN GANZ 7F STRAIGHT (CATHETERS) IMPLANT
DEVICE RAD TR BAND REGULAR (VASCULAR PRODUCTS) IMPLANT
GLIDESHEATH SLEND SS 6F .021 (SHEATH) IMPLANT
GLIDESHEATH SLENDER 7FR .021G (SHEATH) IMPLANT
GUIDEWIRE .025 260CM (WIRE) IMPLANT
GUIDEWIRE INQWIRE 1.5J.035X260 (WIRE) IMPLANT
INQWIRE 1.5J .035X260CM (WIRE) ×1
KIT HEART LEFT (KITS) ×1 IMPLANT
PACK CARDIAC CATHETERIZATION (CUSTOM PROCEDURE TRAY) ×1 IMPLANT
SHEATH PROBE COVER 6X72 (BAG) IMPLANT
TRANSDUCER W/STOPCOCK (MISCELLANEOUS) ×1 IMPLANT

## 2022-04-14 NOTE — Anesthesia Postprocedure Evaluation (Signed)
Anesthesia Post Note  Patient: Tahara Ruffini  Procedure(s) Performed: TRANSESOPHAGEAL ECHOCARDIOGRAM (TEE)     Patient location during evaluation: Endoscopy Anesthesia Type: MAC Level of consciousness: awake and alert Pain management: pain level controlled Vital Signs Assessment: post-procedure vital signs reviewed and stable Respiratory status: spontaneous breathing, nonlabored ventilation, respiratory function stable and patient connected to nasal cannula oxygen Cardiovascular status: blood pressure returned to baseline and stable Postop Assessment: no apparent nausea or vomiting Anesthetic complications: no  No notable events documented.  Last Vitals:  Vitals:   04/14/22 0708 04/14/22 0848  BP: 120/73   Pulse: (!) 103   Resp: (!) 21   Temp: (!) 36.1 C   SpO2: 96% 100%    Last Pain:  Vitals:   04/14/22 0917  TempSrc:   PainSc: 0-No pain                 Monasia Lair L Jarely Juncaj

## 2022-04-14 NOTE — H&P (Signed)
Advanced Heart Failure Team History and Physical Note   PCP:  Windell Hummingbird, PA-C  PCP-Cardiology: None     Reason for Admission: CHF, post-cath   HPI:    Kimberly [name] is a 66 y.o. female who has a history of paroxysmal atrial fibrillation, obesity, and nonischemic cardiomyopathy.  She was admitted in 3/15 at [facility] with atrial fibrillation and CHF.  TEE was done in preparation for DCCV, showing EF 25-30% but there was LA thrombus so DCCV was not done.  Plan was for anticoagulation x 3 months followed by TEE-guided DCCV in the OR (due to patient's body habitus).  LHC was also done, showing mild nonobstructive CAD.  Patient was diuresed with IV Lasix in the hospital and started on apixaban.    Patient had TEE again in 6/15, showing EF 35-40% with diffuse hypokinesis and no LAA thrombus.  She was cardioverted to NSR (with difficulty).  I started her at that time on amiodarone.  She then went back into atrial fibrillation.  After she had been loaded on amiodarone, cardioverted her again in 7/15 to NSR.     She had gone back into atrial fibrillation in 1/18 and amiodarone was increased to bid in preparation for DCCV. However, she spontaneously converted to NSR.   PYP scan in 4/19 was grade 3, strongly suggestive of TTR amyloidosis.  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  Echo 8/19 showed EF 60%, moderate LVH, mild aortic stenosis.  Of note, she has carpal tunnel syndrome.  She was started on tafamidis.    She went back into atrial fibrillation and had DCCV in 2/20 but was back in atrial fibrillation a few weeks later when she went to atrial fibrillation clinic.  Dr. Rayann Heman thought that morbid obesity precluded atrial fibrillation ablation and concerns about compliance led them not to start her on Tikosyn.  Instead, it was recommended that she start on CPAP for her OSA, lose weight, and re-attempt DCCV while on amiodarone.    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  In 7/21, she developed hematochezia and had EGD/colonoscopy which were unrevealing except for internal hemorrhoids.    Echo in 10/22 showed EF 60-65%, normal RV, moderate AS mean gradient 20 mmHg with AVA 1.56 cm^2, mild AI.    Echo was done in 11/23 and showed EF 55-60%, mild LVH, mildly decreased RV systolic function with normal RV size, PASP 47 mmHg, paradoxical low flow/low gradient severe AS (mean 25 mmHg, AVA 0.6 cm^2). The aortic stenosis appeared to have progressed.    She came to the hospital today for TEE followed by RHC/LHC to workup aortic stenosis.  TEE showed an abnormal trileaflet aortic valve with doming of the right coronary cusp.  There was severe aortic stenosis with moderate aortic insufficiency.  LHC showed nonobstructive CAD.  RHC showed elevated right and left heart filling pressures with pulmonary venous hypertension.  Interestingly, her peak-to-peak aortic gradient was only 18 mmHg.    Patient is in permanent atrial fibrillation. She has been more short of breath recently, torsemide was increased to 60 mg daily at last office visit.  She is dyspneic with any moderate exertion.  She has orthopnea and raises the head of the bed. No lightheadedness/dizziness.  No chest pain.   She will be admitted post-cath, has no one to stay with her at home, also is volume overloaded.  Review of Systems: All systems reviewed and negative except as per HPI.   Home Medications Prior to Admission medications   Medication Sig Start Date End Date Taking? Authorizing Provider  acetaminophen (TYLENOL) 650 MG CR tablet Take 1,300 mg by mouth every 8 (eight) hours as needed for pain.    Yes [provider]  benzonatate (TESSALON) 100 MG capsule Take 100 mg by mouth 3 (three) times daily as needed. 04/01/21  Yes [provider]  Cholecalciferol (VITAMIN D-3) 5000 UNITS TABS Take  5,000 Units by mouth daily.   Yes [provider]  Docusate Sodium (DSS) 100 MG CAPS Take by mouth as needed.   Yes [provider]  ELIQUIS 5 MG TABS tablet TAKE 1 TABLET (5 MG TOTAL) BY MOUTH 2 (TWO) TIMES DAILY. 06/07/21  Yes [name], MD  Ferrous Sulfate 27 MG TABS Take 27 mg by mouth 2 (two) times daily.    Yes [provider]  JARDIANCE 10 MG TABS tablet TAKE 1 TABLET BY MOUTH DAILY BEFORE BREAKFAST. 07/22/21  Yes [place], [place], FNP  ketoconazole (NIZORAL) 2 % cream Apply 1 application topically as needed. 12/06/19  Yes [provider]  metoprolol succinate (TOPROL-XL) 100 MG 24 hr tablet Take 1 tablet (100 mg total) by mouth 2 (two) times daily. 12/09/21  Yes [name], MD  Omega-3 1000 MG CAPS Take 1,000 mg by mouth every morning.   Yes [provider]  potassium chloride (KLOR-CON) 10 MEQ tablet Take 5 tablets (50 mEq total) by mouth daily. 04/08/22  Yes [name], MD  RESTASIS 0.05 % ophthalmic emulsion Place 1 drop into both eyes daily as needed. 06/13/19  Yes [provider]  rosuvastatin (CRESTOR) 40 MG tablet Take 1 tablet (40 mg total) by mouth at bedtime. 04/02/20  Yes [name], MD  torsemide (DEMADEX) 20 MG tablet Take 3 tablets (60 mg total) by mouth daily. 04/08/22  Yes [name], MD  ezetimibe (ZETIA) 10 MG tablet Take 0.5 tablets (5 mg total) by mouth daily. 11/28/21 11/28/22  Lyda Jester M, PA-C  fluticasone furoate-vilanterol (BREO ELLIPTA) 100-25 MCG/INH AEPB Inhale 1 puff into the lungs every morning. As needed 06/01/20   [provider]  nitroGLYCERIN (NITROSTAT) 0.4 MG SL tablet Place 1 tablet (0.4 mg total) under the tongue every 5 (five) minutes as needed for chest pain. 04/21/19   [name], MD  Tafamidis Meglumine, Cardiac, (VYNDAQEL) 20 MG CAPS Take 4 capsules (80 mg)  by mouth daily. 10/02/21   [name], MD    Past Medical History: Past Medical  History:  Diagnosis Date   Aortic stenosis    Arthritis    KNEES   Atrial fibrillation ([place])    Complication of anesthesia    Diabetes ([place])    HLD (hyperlipidemia)    Left atrial thrombus    Morbid obesity ([facility])    NICM (nonischemic cardiomyopathy) ([place])    EF 30%   PONV (postoperative nausea and vomiting)    Unilateral vestibular schwannoma ([place]) 08/29/2019   left    Past Surgical History: Past Surgical History:  Procedure Laterality Date   CARDIOVERSION N/A 07/29/2013   Procedure: CARDIOVERSION;  Surgeon: [name], MD;  Location: [place];  Service: Cardiovascular;  Laterality: N/A;   CARDIOVERSION N/A 11/11/2013   Procedure: CARDIOVERSION;  Surgeon: [name], MD;  Location: [place];  Service: Cardiovascular;  Laterality: N/A;   CARDIOVERSION N/A 12/08/2013  Procedure: CARDIOVERSION;  Surgeon: [name], MD;  Location: [facility] ENDOSCOPY;  Service: Cardiovascular;  Laterality: N/A;   CARDIOVERSION N/A 06/30/2018   Procedure: CARDIOVERSION;  Surgeon: [name], MD;  Location: [facility] ENDOSCOPY;  Service: Cardiovascular;  Laterality: N/A;   JOINT REPLACEMENT     LEFT HEART CATHETERIZATION WITH CORONARY ANGIOGRAM N/A 08/01/2013   Procedure: LEFT HEART CATHETERIZATION WITH CORONARY ANGIOGRAM;  Surgeon: Blane Ohara, MD;  Location: [facility] CATH LAB;  Service: Cardiovascular;  Laterality: N/A;   TEE WITHOUT CARDIOVERSION N/A 07/29/2013   Procedure: TRANSESOPHAGEAL ECHOCARDIOGRAM (TEE);  Surgeon: [name], MD;  Location: [place];  Service: Cardiovascular;  Laterality: N/A;   TEE WITHOUT CARDIOVERSION N/A 11/11/2013   Procedure: TRANSESOPHAGEAL ECHOCARDIOGRAM (TEE);  Surgeon: [name], MD;  Location: [facility] ENDOSCOPY;  Service: Cardiovascular;  Laterality: N/A;    Family History:  Family History  Problem Relation Age of Onset   CVA Mother     Social History: Social History   Socioeconomic History   Marital status: Single    Spouse name:  Not on file   Number of children: Not on file   Years of education: Not on file   Highest education level: Not on file  Occupational History   Not on file  Tobacco Use   Smoking status: Never   Smokeless tobacco: Never  Vaping Use   Vaping Use: Never used  Substance and Sexual Activity   Alcohol use: No    Alcohol/week: 0.0 standard drinks of alcohol   Drug use: No   Sexual activity: Not Currently  Other Topics Concern   Not on file  Social History Narrative   Right handed    One story home alone   Caffeine 2 cups a day   Social Determinants of Health   Financial Resource Strain: Not on file  Food Insecurity: Not on file  Transportation Needs: Not on file  Physical Activity: Not on file  Stress: Not on file  Social Connections: Not on file    Allergies:  Allergies  Allergen Reactions   Tramadol Nausea And Vomiting   Spironolactone Itching    Objective:    Vital Signs:   Temp:  [97 F (36.1 C)] 97 F (36.1 C) (11/27 0708) Pulse Rate:  [0-103] 76 (11/27 1000) Resp:  [14-25] 16 (11/27 1000) BP: (120-146)/(73-100) 127/85 (11/27 1000) SpO2:  [94 %-100 %] 100 % (11/27 1000) Weight:  [166 kg] 166 kg (11/27 0708)   Filed Weights   04/14/22 0708  Weight: (!) 166 kg     Physical Exam     General:  Well appearing. No respiratory difficulty HEENT: Normal Neck: Thick, JVP difficult but suspect elevated. Carotids 2+ bilat; no bruits. No lymphadenopathy or thyromegaly appreciated. Cor: PMI nondisplaced. Regular rate & rhythm. No rubs, gallops. 3/6 SEM RUSB, S2 can be heard.  Lungs: Clear Abdomen: Soft, nontender, nondistended. No hepatosplenomegaly. No bruits or masses. Good bowel sounds. Extremities: No cyanosis, clubbing, rash. 1+edema to knees.  Neuro: Alert & oriented x 3, cranial nerves grossly intact. moves all 4 extremities w/o difficulty. Affect pleasant.   Telemetry   Atrial fibrillation 80s (personally reviewed)  EKG   pending  Labs      Basic Metabolic Panel: Recent Labs  Lab 04/08/22 1221  NA 144  K 4.9  CL 106  CO2 26  GLUCOSE 84  BUN 16  CREATININE 1.05*  CALCIUM 9.6    Liver Function Tests: No results for input(s): "AST", "ALT", "ALKPHOS", "BILITOT", "  PROT", "ALBUMIN" in the last 168 hours. No results for input(s): "LIPASE", "AMYLASE" in the last 168 hours. No results for input(s): "AMMONIA" in the last 168 hours.  CBC: Recent Labs  Lab 04/08/22 1221  WBC 5.3  HGB 10.7*  HCT 33.5*  MCV 92.5  PLT 227    Cardiac Enzymes: No results for input(s): "CKTOTAL", "CKMB", "CKMBINDEX", "TROPONINI" in the last 168 hours.  BNP: BNP (last 3 results) Recent Labs    05/28/21 1538 11/28/21 1541 04/08/22 1221  BNP 126.9* 110.6* 191.9*    ProBNP (last 3 results) No results for input(s): "PROBNP" in the last 8760 hours.   CBG: No results for input(s): "GLUCAP" in the last 168 hours.  Coagulation Studies: No results for input(s): "LABPROT", "INR" in the last 72 hours.  Imaging: CARDIAC CATHETERIZATION  Result Date: 04/14/2022   Dist LAD lesion is 40% stenosed. 1. Elevated right and left heart filling pressures. 2. Pulmonary venous hypertension. 3. Peak to peak aortic valve gradient 18 mmHg 4. Nonobstructive mild CAD.   ECHO TEE  Result Date: 04/14/2022    TRANSESOPHOGEAL ECHO REPORT   Patient Name:   Kimberly [name] Date of Exam: 04/14/2022 Medical Rec #:  [id number]     Height:       66.0 in Accession #:    [id number]    Weight:       366.0 lb Date of Birth:  [birth year]     BSA:          2.585 m Patient Age:    60 years      BP:           120/60 mmHg Patient Gender: F             HR:           120 bpm. Exam Location:  Inpatient Procedure: Transesophageal Echo, Cardiac Doppler and Color Doppler Indications:    Aortic stenosis  History:        Patient has prior history of Echocardiogram examinations.  Sonographer:    NA Referring Phys: Parker: The transesophogeal probe was passed  without difficulty through the esophogus of the patient. Sedation performed by different physician. The patient developed no complications during the procedure.  IMPRESSIONS  1. Left ventricular ejection fraction, by estimation, is 55 to 60%. The left ventricle has normal function. There is mild concentric left ventricular hypertrophy. Left ventricular diastolic parameters are indeterminate.  2. Right ventricular systolic function is normal. The right ventricular size is normal.  3. Left atrial size was moderately dilated. No left atrial/left atrial appendage thrombus was detected.  4. Right atrial size was mildly dilated.  5. The mitral valve is normal in structure. Mild mitral valve regurgitation. No evidence of mitral stenosis.  6. Peak RV-RA gradient 48 mmHg. Tricuspid valve regurgitation is mild to moderate.  7. Doming of the right coronary cusp. The aortic valve is tricuspid. There is severe calcifcation of the aortic valve. Aortic valve regurgitation is moderate. Severe aortic valve stenosis. Aortic valve area, by VTI measures 0.86 cm, 0.71 cm2 by planimetry. Aortic valve mean gradient measures 40.0 mmHg.  8. PFO noted by color doppler with left to right shunting. FINDINGS  Left Ventricle: Left ventricular ejection fraction, by estimation, is 55 to 60%. The left ventricle has normal function. The left ventricular internal cavity size was normal in size. There is mild concentric left ventricular hypertrophy. Left ventricular diastolic parameters are indeterminate. Right Ventricle: The right ventricular size is  normal. No increase in right ventricular wall thickness. Right ventricular systolic function is normal. Left Atrium: Left atrial size was moderately dilated. No left atrial/left atrial appendage thrombus was detected. Right Atrium: Right atrial size was mildly dilated. Pericardium: There is no evidence of pericardial effusion. Mitral Valve: The mitral valve is normal in structure. Mild mitral valve  regurgitation. No evidence of mitral valve stenosis. Tricuspid Valve: Peak RV-RA gradient 48 mmHg. The tricuspid valve is normal in structure. Tricuspid valve regurgitation is mild to moderate. Aortic Valve: Doming of the right coronary cusp. The aortic valve is tricuspid. There is severe calcifcation of the aortic valve. Aortic valve regurgitation is moderate. Aortic regurgitation PHT measures 376 msec. Severe aortic stenosis is present. Aortic valve mean gradient measures 40.0 mmHg. Aortic valve peak gradient measures 60.2 mmHg. Aortic valve area, by VTI measures 0.86 cm. Pulmonic Valve: The pulmonic valve was normal in structure. Pulmonic valve regurgitation is not visualized. Aorta: The aortic root is normal in size and structure. IAS/Shunts: PFO noted by color doppler with left to right shunting.  LEFT VENTRICLE PLAX 2D LVOT diam:     2.00 cm LV SV:         69 LV SV Index:   27 LVOT Area:     3.14 cm  AORTIC VALVE AV Area (Vmax):    0.83 cm AV Area (Vmean):   0.81 cm AV Area (VTI):     0.86 cm AV Vmax:           388.00 cm/s AV Vmean:          289.000 cm/s AV VTI:            0.798 m AV Peak Grad:      60.2 mmHg AV Mean Grad:      40.0 mmHg LVOT Vmax:         103.00 cm/s LVOT Vmean:        74.400 cm/s LVOT VTI:          0.219 m LVOT/AV VTI ratio: 0.27 AI PHT:            376 msec TRICUSPID VALVE TR Peak grad:   47.6 mmHg TR Vmax:        345.00 cm/s  SHUNTS Systemic VTI:  0.22 m Systemic Diam: 2.00 cm Kimberly Milligan McleanMD Electronically signed by Franki Monte Signature Date/Time: 04/14/2022/8:36:13 AM    Final      Assessment/Plan   1. Acute on chronic diastolic CHF: Nonischemic cardiomyopathy.  EF 35-40% on TEE in 6/15 but then EF back to 60-65% by 10/15 and remained normal on echo in 8/19 and in 10/22.  LHC without significant coronary disease in 3/15.  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  NYHA class III symptoms, she is volume overloaded on exam and by RHC today.  Aortic stenosis additionally now appears to be severe.  - BMET to be sent this morning, then start Lasix 80 mg IV bid this afternoon.  - Continue Toprol XL 100 mg BID - Continue empagliflozin 10 mg daily.  2. 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  Atrial fibrillation and carpal tunnel syndrome, both of which she has, go along with amyloidosis.  Her history of orthostatic symptoms as well as symptoms in toes and feet consistent with peripheral neuropathy are also likely related to  amyloidosis.  - Continue tafamidis.   - Pt declined vutrisiran.  - She was seen by  Dr. 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 Given autosomal dominant condition, Brooklee's daughter is at a 50% risk of inheriting the pathogenic variant in TTR (V142I). Genetic testing for the familial pathogenic variant was strongly recommended but pt's daughter declined.  3. Atrial fibrillation: Last DCCV in 2/20 but back in atrial fibrillation since then.  Dr. Rayann Heman saw, decided against atrial fibrillation ablation due to morbid obesity.  EP decided against Tikosyn due to compliance concerns. She failed amiodarone.  She remains in atrial fibrillation, I think that the atrial fibrillation is likely permanent at this point.  Rate controlled w/ ? blocker - Continue Toprol XL 100 mg bid.  - Continue Eliquis.  Denies abnormal bleeding. Check CBC today  4. CAD: Mild, nonobstructive by cath today. No chest pain.  - No ASA given Eliquis use.   - Continue crestor 40 mg daily and Zetia. 4. Bilateral carpal tunnel symptoms: Likely related to amyloidosis.  5. OSA: She says that she cannot tolerate CPAP due to tinnitus.  6. HTN: BP controlled.  7.  Aortic stenosis: Echo in 11/23 was concerning for paradoxical low flow/low gradient severe AS.  This is relatively common with TTR amyloidosis. TEE today showed severe AS with mean gradient 40 mmHg, AVA 0.86 by VTI and 0.71 by planimetry with moderate aortic regurgitation. The valve appeared trileaflet but the right cusp appeared to dome.  Interestingly, peak-to-peak aortic gradient was only 18 mmHg.   - I do not think she would be a good SAVR candidate with obesity and comorbidities.  I will refer to structural heart for TAVR evaluation.  Obesity will make groin vascular access difficult.  8. Obesity: She would benefit from semaglutide but does not want injection.  Would recommend Rybelsus. I referred her to pharmacy clinic for this.    [name], MD 04/14/2022, 10:17 AM  Advanced Heart Failure Team Pager [phone] (M-F; 7a - 5p)  Please contact [facility] for night-coverage after hours (4p -7a ) and weekends on [email or web address]

## 2022-04-14 NOTE — Transfer of Care (Signed)
Immediate Anesthesia Transfer of Care Note  Patient: Janielle Mittelstadt  Procedure(s) Performed: TRANSESOPHAGEAL ECHOCARDIOGRAM (TEE)  Patient Location: Cath Lab  Anesthesia Type:MAC  Level of Consciousness: awake, drowsy, and patient cooperative  Airway & Oxygen Therapy: Patient Spontanous Breathing and Patient connected to face mask oxygen  Post-op Assessment: Report given to RN, Post -op Vital signs reviewed and stable, and Patient moving all extremities X 4  Post vital signs: Reviewed and stable  Last Vitals:  Vitals Value Taken Time  BP    Temp    Pulse    Resp    SpO2      Last Pain:  Vitals:   04/14/22 0708  TempSrc: Temporal  PainSc: 0-No pain         Complications: No notable events documented.

## 2022-04-14 NOTE — Interval H&P Note (Signed)
History and Physical Interval Note:  04/14/2022 7:48 AM  Kimberly [name]  has presented today for surgery, with the diagnosis of aortic stenosis.  The various methods of treatment have been discussed with the patient and family. After consideration of risks, benefits and other options for treatment, the patient has consented to  Procedure(s): TRANSESOPHAGEAL ECHOCARDIOGRAM (TEE) (N/A) as a surgical intervention.  The patient's history has been reviewed, patient examined, no change in status, stable for surgery.  I have reviewed the patient's chart and labs.  Questions were answered to the patient's satisfaction.     Nevyn Bossman [name]

## 2022-04-14 NOTE — Progress Notes (Addendum)
Phlebotomy unable to collect blood at this time. Will try again one more time in the next hour. MD notified

## 2022-04-14 NOTE — CV Procedure (Signed)
Procedure: TEE  Sedation: Per anesthesiology  Indication: Aortic stenosis.   Findings: Severe aortic stenosis.  Please see echo section for full report.   [name] 04/14/2022 8:28 AM

## 2022-04-14 NOTE — Progress Notes (Signed)
Heart Failure Navigator Progress Note  Assessed for Heart & Vascular TOC clinic readiness.  Patient does not meet criteria due to Advanced Heart Failure Team patient of Dr. McLean.   Navigator will sign off at this time.   Mcdonald Reiling, BSN, RN Heart Failure Nurse Navigator Secure Chat Only   

## 2022-04-15 DIAGNOSIS — I110 Hypertensive heart disease with heart failure: Secondary | ICD-10-CM | POA: Diagnosis not present

## 2022-04-15 DIAGNOSIS — I5033 Acute on chronic diastolic (congestive) heart failure: Secondary | ICD-10-CM | POA: Diagnosis not present

## 2022-04-15 LAB — POCT I-STAT EG7
Acid-Base Excess: 4 mmol/L — ABNORMAL HIGH (ref 0.0–2.0)
Bicarbonate: 30.2 mmol/L — ABNORMAL HIGH (ref 20.0–28.0)
Calcium, Ion: 1.24 mmol/L (ref 1.15–1.40)
HCT: 32 % — ABNORMAL LOW (ref 36.0–46.0)
Hemoglobin: 10.9 g/dL — ABNORMAL LOW (ref 12.0–15.0)
O2 Saturation: 63 %
Potassium: 3.7 mmol/L (ref 3.5–5.1)
Sodium: 143 mmol/L (ref 135–145)
TCO2: 32 mmol/L (ref 22–32)
pCO2, Ven: 54 mmHg (ref 44–60)
pH, Ven: 7.356 (ref 7.25–7.43)
pO2, Ven: 35 mmHg (ref 32–45)

## 2022-04-15 LAB — BASIC METABOLIC PANEL
Anion gap: 9 (ref 5–15)
BUN: 18 mg/dL (ref 8–23)
CO2: 27 mmol/L (ref 22–32)
Calcium: 9.2 mg/dL (ref 8.9–10.3)
Chloride: 103 mmol/L (ref 98–111)
Creatinine, Ser: 1.13 mg/dL — ABNORMAL HIGH (ref 0.44–1.00)
GFR, Estimated: 54 mL/min — ABNORMAL LOW (ref >60)
Glucose, Bld: 121 mg/dL — ABNORMAL HIGH (ref 70–99)
Potassium: 3.4 mmol/L — ABNORMAL LOW (ref 3.5–5.1)
Sodium: 139 mmol/L (ref 135–145)

## 2022-04-15 MED ORDER — POTASSIUM CHLORIDE CRYS ER 20 MEQ PO TBCR
40.0000 meq | EXTENDED_RELEASE_TABLET | ORAL | Status: AC
  Administered 2022-04-15 (×2): 40 meq via ORAL
  Filled 2022-04-15 (×2): qty 2

## 2022-04-15 MED ORDER — TAFAMIDIS MEGLUMINE (CARDIAC) 20 MG PO CAPS
40.0000 mg | ORAL_CAPSULE | Freq: Two times a day (BID) | ORAL | Status: DC
  Administered 2022-04-15 – 2022-04-16 (×3): 40 mg via ORAL
  Filled 2022-04-15 (×4): qty 2

## 2022-04-15 MED FILL — Verapamil HCl IV Soln 2.5 MG/ML: INTRAVENOUS | Qty: 2 | Status: AC

## 2022-04-15 MED FILL — Lidocaine HCl Local Preservative Free (PF) Inj 1%: INTRAMUSCULAR | Qty: 30 | Status: AC

## 2022-04-15 NOTE — Progress Notes (Signed)
   04/15/22 1200  Mobility  Activity Ambulated with assistance in hallway  Level of Assistance Standby assist, set-up cues, supervision of patient - no hands on  Assistive Device Cane  Distance Ambulated (ft) 140 ft  Activity Response Tolerated well  Mobility Referral Yes  $Mobility charge 1 Mobility   Mobility Specialist Progress Note  Pre-Mobility: 94% SpO2(RA)  During Mobility: 87-96% SpO2(RA) Post-Mobility: 95% SpO2 (RA)   Pt was in bed and agreeable. X2 standing breaks d/t SOB. Returned EOB w/ all needs met and call bell in reach. NT in room  [place] Specialist  Please contact via [place] or Rehab office at [phone]

## 2022-04-15 NOTE — Progress Notes (Signed)
   04/15/22 1631  Assess: MEWS Score  Temp 98.2 F (36.8 C)  BP 122/81  MAP (mmHg) 95  Pulse Rate 90  ECG Heart Rate 79  Resp 20  SpO2 96 %  Assess: MEWS Score  MEWS Temp 0  MEWS Systolic 0  MEWS Pulse 0  MEWS RR 0  MEWS LOC 0  MEWS Score 0  MEWS Score Color Green  Assess: SIRS CRITERIA  SIRS Temperature  0  SIRS Pulse 0  SIRS Respirations  0  SIRS WBC 1  SIRS Score Sum  1

## 2022-04-15 NOTE — Care Management Obs Status (Signed)
[facility] NOTIFICATION   Patient Details  Name: Kimberly [name] MRN: [id number] Date of Birth: 11-05-1955   Medicare Observation Status Notification Given:  Yes    Erenest Rasher, RN 04/15/2022, 5:45 PM

## 2022-04-15 NOTE — Progress Notes (Signed)
Pt was educated on importance of daily weights, s/s of weight gain, sodium reduction, importance of exercise, and CRPII. Pt would like referral to be placed but is unsure aout joining program. Will refer to [facility].   Christen Bame 04/15/2022 2:08 PM   [id number]

## 2022-04-15 NOTE — Progress Notes (Addendum)
Advanced Heart Failure Rounding Note  PCP-Cardiologist: None   Subjective:     Diuresing with IV lasix 80 mg BID. Weight down 4 lb.   Denies dyspnea at rest. Hasn't tried to get up and walk around.   Had a lot of questions about results of testing yesterday.   R/LHC, 04/14/22: -Mild CAD, elevated right and left filling pressures, pulmonary venous hypertension, peak to peak aortic valve gradient is 18 mmHg  TEE, 04/14/22: EF 55-60%, RV okay, moderate LAE, moderate AI, severe AS with mean gradient of 40 mmHg, AVA by VTI 0.86 cm2 and 0.71 cm2 by planimetry  Objective:   Weight Range: (!) 160.8 kg Body mass index is 57.2 kg/m.   Vital Signs:   Temp:  [97.7 F (36.5 C)-97.9 F (36.6 C)] 97.7 F (36.5 C) (11/28 0728) Pulse Rate:  [0-93] 78 (11/28 0728) Resp:  [14-25] 20 (11/28 0728) BP: (104-147)/(61-109) 116/63 (11/28 0728) SpO2:  [94 %-100 %] 97 % (11/28 0728) Weight:  [160.8 kg-162.4 kg] 160.8 kg (11/28 0505) Last BM Date : 04/13/22  Weight change: Filed Weights   04/14/22 0708 04/14/22 1241 04/15/22 0505  Weight: (!) 166 kg (!) 162.4 kg (!) 160.8 kg    Intake/Output:   Intake/Output Summary (Last 24 hours) at 04/15/2022 0802 Last data filed at 04/15/2022 0726 Gross per 24 hour  Intake 476 ml  Output 3075 ml  Net -2599 ml      Physical Exam    General:  Lying comfortably in bed. HEENT: Normal Neck: Supple. JVP difficult but appears elevated. Carotids 2+ bilat; no bruits.  Cor: PMI nondisplaced. Regular rate & rhythm. No rubs, gallops or murmurs. Lungs: Clear Abdomen: morbidly obese, soft, nontender, nondistended.  Extremities: No cyanosis, clubbing, rash, 1+ edema Neuro: Alert & orientedx3, cranial nerves grossly intact. moves all 4 extremities w/o difficulty. Affect pleasant   Telemetry   Afib 70s, rate faster last hr 100s-120s  Labs    CBC Recent Labs    04/14/22 0917 04/14/22 1629  WBC  --  5.2  NEUTROABS  --  3.1  HGB 11.2* 11.9*   HCT 33.0* 36.8  MCV  --  90.6  PLT  --  502   Basic Metabolic Panel Recent Labs    04/14/22 1629 04/15/22 0041  NA 141 139  K 3.8 3.4*  CL 104 103  CO2 26 27  GLUCOSE 103* 121*  BUN 21 18  CREATININE 1.21* 1.13*  CALCIUM 9.6 9.2   Liver Function Tests Recent Labs    04/14/22 1629  AST 21  ALT 14  ALKPHOS 99  BILITOT 0.8  PROT 8.2*  ALBUMIN 4.2   No results for input(s): "LIPASE", "AMYLASE" in the last 72 hours. Cardiac Enzymes No results for input(s): "CKTOTAL", "CKMB", "CKMBINDEX", "TROPONINI" in the last 72 hours.  BNP: BNP (last 3 results) Recent Labs    11/28/21 1541 04/08/22 1221 04/14/22 1629  BNP 110.6* 191.9* 144.6*    ProBNP (last 3 results) No results for input(s): "PROBNP" in the last 8760 hours.   D-Dimer No results for input(s): "DDIMER" in the last 72 hours. Hemoglobin A1C No results for input(s): "HGBA1C" in the last 72 hours. Fasting Lipid Panel No results for input(s): "CHOL", "HDL", "LDLCALC", "TRIG", "CHOLHDL", "LDLDIRECT" in the last 72 hours. Thyroid Function Tests Recent Labs    04/14/22 1629  TSH 0.932    Other results:   Imaging    CARDIAC CATHETERIZATION  Result Date: 04/14/2022   Dist LAD lesion  is 40% stenosed. 1. Elevated right and left heart filling pressures. 2. Pulmonary venous hypertension. 3. Peak to peak aortic valve gradient 18 mmHg 4. Nonobstructive mild CAD.   ECHO TEE  Result Date: 04/14/2022    TRANSESOPHOGEAL ECHO REPORT   Patient Name:   LENYA STERNE Date of Exam: 04/14/2022 Medical Rec #:  [id number]     Height:       66.0 in Accession #:    [id number]    Weight:       366.0 lb Date of Birth:  [birth year]     BSA:          2.585 m Patient Age:    66 years      BP:           120/60 mmHg Patient Gender: F             HR:           120 bpm. Exam Location:  Inpatient Procedure: Transesophageal Echo, Cardiac Doppler and Color Doppler Indications:    Aortic stenosis  History:        Patient has prior  history of Echocardiogram examinations.  Sonographer:    NA Referring Phys: [place]: The transesophogeal probe was passed without difficulty through the esophogus of the patient. Sedation performed by different physician. The patient developed no complications during the procedure.  IMPRESSIONS  1. Left ventricular ejection fraction, by estimation, is 55 to 60%. The left ventricle has normal function. There is mild concentric left ventricular hypertrophy. Left ventricular diastolic parameters are indeterminate.  2. Right ventricular systolic function is normal. The right ventricular size is normal.  3. Left atrial size was moderately dilated. No left atrial/left atrial appendage thrombus was detected.  4. Right atrial size was mildly dilated.  5. The mitral valve is normal in structure. Mild mitral valve regurgitation. No evidence of mitral stenosis.  6. Peak RV-RA gradient 48 mmHg. Tricuspid valve regurgitation is mild to moderate.  7. Doming of the right coronary cusp. The aortic valve is tricuspid. There is severe calcifcation of the aortic valve. Aortic valve regurgitation is moderate. Severe aortic valve stenosis. Aortic valve area, by VTI measures 0.86 cm, 0.71 cm2 by planimetry. Aortic valve mean gradient measures 40.0 mmHg.  8. PFO noted by color doppler with left to right shunting. FINDINGS  Left Ventricle: Left ventricular ejection fraction, by estimation, is 55 to 60%. The left ventricle has normal function. The left ventricular internal cavity size was normal in size. There is mild concentric left ventricular hypertrophy. Left ventricular diastolic parameters are indeterminate. Right Ventricle: The right ventricular size is normal. No increase in right ventricular wall thickness. Right ventricular systolic function is normal. Left Atrium: Left atrial size was moderately dilated. No left atrial/left atrial appendage thrombus was detected. Right Atrium: Right atrial size was mildly  dilated. Pericardium: There is no evidence of pericardial effusion. Mitral Valve: The mitral valve is normal in structure. Mild mitral valve regurgitation. No evidence of mitral valve stenosis. Tricuspid Valve: Peak RV-RA gradient 48 mmHg. The tricuspid valve is normal in structure. Tricuspid valve regurgitation is mild to moderate. Aortic Valve: Doming of the right coronary cusp. The aortic valve is tricuspid. There is severe calcifcation of the aortic valve. Aortic valve regurgitation is moderate. Aortic regurgitation PHT measures 376 msec. Severe aortic stenosis is present. Aortic valve mean gradient measures 40.0 mmHg. Aortic valve peak gradient measures 60.2 mmHg. Aortic valve area, by VTI measures 0.86 cm. Pulmonic Valve:  The pulmonic valve was normal in structure. Pulmonic valve regurgitation is not visualized. Aorta: The aortic root is normal in size and structure. IAS/Shunts: PFO noted by color doppler with left to right shunting.  LEFT VENTRICLE PLAX 2D LVOT diam:     2.00 cm LV SV:         69 LV SV Index:   27 LVOT Area:     3.14 cm  AORTIC VALVE AV Area (Vmax):    0.83 cm AV Area (Vmean):   0.81 cm AV Area (VTI):     0.86 cm AV Vmax:           388.00 cm/s AV Vmean:          289.000 cm/s AV VTI:            0.798 m AV Peak Grad:      60.2 mmHg AV Mean Grad:      40.0 mmHg LVOT Vmax:         103.00 cm/s LVOT Vmean:        74.400 cm/s LVOT VTI:          0.219 m LVOT/AV VTI ratio: 0.27 AI PHT:            376 msec TRICUSPID VALVE TR Peak grad:   47.6 mmHg TR Vmax:        345.00 cm/s  SHUNTS Systemic VTI:  0.22 m Systemic Diam: 2.00 cm Tonye Tancredi McleanMD Electronically signed by Franki Monte Signature Date/Time: 04/14/2022/8:36:13 AM    Final      Medications:     Scheduled Medications:  apixaban  5 mg Oral BID   cholecalciferol  5,000 Units Oral Daily   empagliflozin  10 mg Oral QAC breakfast   ezetimibe  5 mg Oral Daily   ferrous sulfate  325 mg Oral BID   furosemide  80 mg Intravenous BID    metoprolol succinate  100 mg Oral BID   omega-3 acid ethyl esters  1,000 mg Oral q morning   rosuvastatin  40 mg Oral QHS   sodium chloride flush  3 mL Intravenous Q12H   sodium chloride flush  3 mL Intravenous Q12H   sodium chloride flush  3 mL Intravenous Q12H   [name]  80 mg Oral Daily    Infusions:  sodium chloride     sodium chloride     sodium chloride Stopped (04/14/22 1259)   sodium chloride     sodium chloride      PRN Medications: sodium chloride, sodium chloride, sodium chloride, acetaminophen, ondansetron (ZOFRAN) IV, sodium chloride flush, sodium chloride flush, sodium chloride flush   Assessment/Plan   1. Acute on chronic diastolic CHF: Nonischemic cardiomyopathy.  EF 35-40% on TEE in 6/15 but then EF back to 60-65% by 10/15 and remained normal on echo in 8/19 and in 10/22.  LHC without significant coronary disease in 3/15.  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  NYHA class III symptoms, she is volume overloaded on exam and by [place] 11/27.  Aortic stenosis additionally now appears to be severe.  - Continue IV lasix 80 BID today. Supp K. - Continue Toprol XL 100 mg BID - Continue empagliflozin 10 mg daily.  - Unable to tolerate spironolactone - Holding off on ARB/ARNi d/t severe AS 2. 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  Atrial fibrillation and carpal tunnel syndrome, both of which she has, go along with amyloidosis.  Her history of orthostatic symptoms as well as symptoms in toes and feet consistent with peripheral neuropathy are also likely related to amyloidosis.  - Continue tafamidis.   - Pt declined vutrisiran.  - She was seen by  Dr. 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 Given autosomal dominant condition, Eveleigh's daughter is at a 50% risk of inheriting the pathogenic variant in TTR (V142I). Genetic testing for the familial pathogenic variant was strongly recommended but pt's daughter declined.  3. Atrial fibrillation: Last DCCV in 2/20 but back in atrial fibrillation since then.  Dr. Rayann Heman saw, decided against atrial fibrillation ablation due to morbid obesity.  EP decided against Tikosyn due to compliance concerns. She failed amiodarone.  She remains in atrial fibrillation, I think that the atrial fibrillation is likely permanent at this point.  Rate controlled w/ ? blocker - Continue Toprol XL 100 mg bid.  - Continue Eliquis.  Denies abnormal bleeding. Check CBC today  4. CAD: Mild, nonobstructive by cath today. No chest pain.  - No ASA given Eliquis use.   - Continue crestor 40 mg daily and Zetia. 4. Bilateral carpal tunnel symptoms: Likely related to amyloidosis.  5. OSA: She says that she cannot tolerate CPAP due to tinnitus.  6. HTN: BP controlled.  7. Aortic stenosis: Echo in 11/23 was concerning for paradoxical low flow/low gradient severe AS.  This is relatively common with TTR amyloidosis. TEE 04/14/22 showed severe AS with mean gradient 40 mmHg, AVA 0.86 by VTI and 0.71 by planimetry with moderate aortic regurgitation. The valve appeared trileaflet but the right cusp appeared to dome.  Interestingly, peak-to-peak aortic gradient was only 18 mmHg.   - Do not think she would be a good SAVR candidate with obesity and comorbidities.  Will refer to structural heart after discharge for TAVR evaluation.  Obesity will make groin vascular access difficult.  8. Obesity: She would benefit from semaglutide but does not want injection.  Would recommend Rybelsus. Dr. Aundra Dubin referred her to pharmacy clinic for this.   Length of Stay: 0  FINCH, LINDSAY N, PA-C  04/15/2022, 8:02 AM  Advanced  Heart Failure Team Pager [phone] (M-F; 7a - 5p)  Please contact [facility] for night-coverage after hours (5p -7a ) and weekends on [email or web address]   Patient seen with PA, agree with the above note.   She diuresed yesterday, weight down.  No dyspnea but has not been out of bed.   General: NAD Neck: Thick, JVP 10-12 cm, no thyromegaly or thyroid nodule.  Lungs: Clear to auscultation bilaterally with normal respiratory effort. CV: Nondisplaced PMI.  Heart regular S1/S2, no S3/S4, 3/6 SEM RUSB, can hear S2.  1+ ankle edema. Abdomen: Soft, nontender, no hepatosplenomegaly, no distention.  Skin: Intact without lesions or rashes.  Neurologic: Alert and oriented x 3.  Psych: Normal affect. Extremities: No clubbing or cyanosis.  HEENT: Normal.   She still looks volume overloaded on exam, filling pressures high on cath yesterday.  Will give 2 more doses of IV Lasix then likely home tomorrow.   Will need evaluation by structural heart team for TAVR with severe AS by TEE.  Will send message to them.   [name] 04/15/2022 12:08 PM

## 2022-04-15 NOTE — Discharge Summary (Signed)
Advanced Heart Failure Team  Discharge Summary   Patient ID: Kimberly [name] MRN: [id number], DOB/AGE: Mar 14, 1956 66 y.o. Admit date: 04/14/2022 D/C date:     04/16/2022   Primary Discharge Diagnoses:  Acute on chronic diastolic CHF, NICM  Secondary Discharge Diagnoses:  Cardiac amyloidosis Atrial fibrillation CAD Bilateral carpel tunnel  OSA HTN Aortic stenosis Obesity  Hospital Course:  Kimberly [name] is a 66 y.o. female who has a history of paroxysmal atrial fibrillation, cardiac amyloidosis, obesity, and nonischemic cardiomyopathy.  She was admitted in 3/15 at [facility] with atrial fibrillation and CHF.  TEE was done in preparation for DCCV, showing EF 25-30% but there was LA thrombus so DCCV was not done.  Plan was for anticoagulation x 3 months followed by TEE-guided DCCV in the OR (due to patient's body habitus). LHC was also done, showing mild nonobstructive CAD.  Has had recurrent atrial fibrillation with multiple failed DCCVs and failed medical management.  Echo 11/23: EF 55-60%, mild LVH, mildly decreased RV systolic function with normal RV size, PASP 47 mmHg, paradoxical low flow/low gradient severe AS (mean 25 mmHg, AVA 0.6 cm^2). The aortic stenosis appeared to have progressed.     Patient presented for OP TEE and L/RHC for aortic stenosis workup. TEE showed an abnormal trileaflet aortic valve with doming of the right coronary cusp. There was severe aortic stenosis with moderate aortic insufficiency. LHC showed nonobstructive CAD. RHC showed elevated right and left heart filling pressures with pulmonary venous hypertension. Admitted 2/2 volume overload w/ SOB, orthopnea & DOE.   Diuresed well with IV lasix, weight down 16 lbs since admission. Home diuretics regimen adjusted with torsemide increased to 80 mg daily. [name] She was referred to structural heart team  for possible TAVR.   Pt will continue to be followed closely in the HF clinic. Dr Haroldine Laws evaluated and deemed  appropriate for discharge. F/u scheduled     Discharge Weight : 350 poounds.  Discharge Vitals: Blood pressure (!) 90/59, pulse 82, temperature 98.9 F (37.2 C), temperature source Oral, resp. rate 20, height _0  (1.676 m), weight (!) 159 kg, SpO2 93 %.  Labs: Lab Results  Component Value Date   WBC 5.2 04/14/2022   HGB 11.9 (L) 04/14/2022   HCT 36.8 04/14/2022   MCV 90.6 04/14/2022   PLT 227 04/14/2022    Recent Labs  Lab 04/14/22 1629 04/15/22 0041 04/16/22 0050  NA 141   < > 137  K 3.8   < > 3.7  CL 104   < > 99  CO2 26   < > 28  BUN 21   < > 23  CREATININE 1.21*   < > 1.30*  CALCIUM 9.6   < > 9.4  PROT 8.2*  --   --   BILITOT 0.8  --   --   ALKPHOS 99  --   --   ALT 14  --   --   AST 21  --   --   GLUCOSE 103*   < > 121*   < > = values in this interval not displayed.   Lab Results  Component Value Date   CHOL 154 04/08/2022   HDL 51 04/08/2022   LDLCALC 90 04/08/2022   TRIG 63 04/08/2022   BNP (last 3 results) Recent Labs    11/28/21 1541 04/08/22 1221 04/14/22 1629  BNP 110.6* 191.9* 144.6*    ProBNP (last 3 results) No results for input(s): "PROBNP" in the last 8760 hours.  Diagnostic Studies/Procedures   R/LHC, 04/14/22: -Mild CAD, elevated right and left filling pressures, pulmonary venous hypertension, peak to peak aortic valve gradient is 18 mmHg   TEE, 04/14/22: EF 55-60%, RV okay, moderate LAE, moderate AI, severe AS with mean gradient of 40 mmHg, AVA by VTI 0.86 cm2 and 0.71 cm2 by planimetry  Discharge Medications   Allergies as of 04/16/2022       Reactions   Tramadol Nausea And Vomiting   Spironolactone Itching        Medication List     TAKE these medications    acetaminophen 650 MG CR tablet Commonly known as: TYLENOL Take 1,300 mg by mouth every 8 (eight) hours as needed for pain.   benzonatate 100 MG capsule Commonly known as: TESSALON Take 100 mg by mouth 3 (three) times daily as needed.   Breo Ellipta  100-25 MCG/ACT Aepb Generic drug: fluticasone furoate-vilanterol Inhale 1 puff into the lungs every morning. As needed   DSS 100 MG Caps Take by mouth as needed.   Eliquis 5 MG Tabs tablet Generic drug: apixaban TAKE 1 TABLET (5 MG TOTAL) BY MOUTH 2 (TWO) TIMES DAILY. What changed: how much to take   ezetimibe 10 MG tablet Commonly known as: Zetia Take 0.5 tablets (5 mg total) by mouth daily.   Ferrous Sulfate 27 MG Tabs Take 27 mg by mouth 2 (two) times daily.   Jardiance 10 MG Tabs tablet Generic drug: empagliflozin TAKE 1 TABLET BY MOUTH DAILY BEFORE BREAKFAST. What changed:  how much to take when to take this   ketoconazole 2 % cream Commonly known as: NIZORAL Apply 1 application topically as needed.   metoprolol succinate 100 MG 24 hr tablet Commonly known as: TOPROL-XL Take 1 tablet (100 mg total) by mouth 2 (two) times daily.   nitroGLYCERIN 0.4 MG SL tablet Commonly known as: NITROSTAT Place 1 tablet (0.4 mg total) under the tongue every 5 (five) minutes as needed for chest pain.   Omega-3 1000 MG Caps Take 1,000 mg by mouth every morning.   potassium chloride 10 MEQ tablet Commonly known as: KLOR-CON Take 5 tablets (50 mEq total) by mouth daily.   Restasis 0.05 % ophthalmic emulsion Generic drug: cycloSPORINE Place 1 drop into both eyes daily as needed.   rosuvastatin 40 MG tablet Commonly known as: CRESTOR Take 1 tablet (40 mg total) by mouth at bedtime.   torsemide 20 MG tablet Commonly known as: DEMADEX Take 4 tablets (80 mg total) by mouth daily. What changed: how much to take   Vitamin D-3 125 MCG (5000 UT) Tabs Take 5,000 Units by mouth daily.   Vyndaqel 20 MG Caps Generic drug: [name] Take 4 capsules (80 mg)  by mouth daily. What changed:  how much to take when to take this        Disposition   The patient will be discharged in stable condition to home. Discharge Instructions     (HEART FAILURE  PATIENTS) Call MD:  Anytime you have any of the following symptoms: 1) 3 pound weight gain in 24 hours or 5 pounds in 1 week 2) shortness of breath, with or without a dry hacking cough 3) swelling in the hands, feet or stomach 4) if you have to sleep on extra pillows at night in order to breathe.   Complete by: As directed    Amb Referral to Cardiac Rehabilitation   Complete by: As directed    Diagnosis: Heart Failure (see criteria below  if ordering Phase II)   Heart Failure Type: Chronic Systolic & Diastolic   After initial evaluation and assessments completed: Virtual Based Care may be provided alone or in conjunction with Phase 2 Cardiac Rehab based on patient barriers.: Yes   Intensive Cardiac Rehabilitation (ICR) [place] location only OR Traditional Cardiac Rehabilitation (TCR) *If criteria for ICR are not met will enroll in TCR [facility] only): Yes   Diet - low sodium heart healthy   Complete by: As directed    Heart Failure patients record your daily weight using the same scale at the same time of day   Complete by: As directed    Increase activity slowly   Complete by: As directed        Follow-up Information     Early Osmond, MD. Go on 04/21/2022.   Specialty: Cardiology Why: @ 11:20am to discuss your aortic valve problem. Please arrive at least 10 mintues early Contact information: [address] [place] [address] [phone]         [facility] Follow up on 04/24/2022.   Specialty: Cardiology Why: Follow up in the [facility] 04/24/22 at 1030am  Entrance C, free valet Please bring all medications with you Contact information: [address] [id number] [place] [phone]                  Duration of Discharge Encounter: Greater than 35 minutes   Signed, Darrick Grinder NP-C   04/16/2022, 12:24 PM

## 2022-04-15 NOTE — Plan of Care (Signed)
  Problem: Activity: Goal: Ability to return to baseline activity level will improve Outcome: Progressing   Problem: Coping: Goal: Level of anxiety will decrease Outcome: Progressing   Problem: Elimination: Goal: Will not experience complications related to urinary retention Outcome: Progressing   Problem: Safety: Goal: Ability to remain free from injury will improve Outcome: Progressing

## 2022-04-15 NOTE — TOC Initial Note (Signed)
Transition of Care [facility]) - Initial/Assessment Note    Patient Details  Name: Kimberly [name] MRN: [id number] Date of Birth: 1955-12-31  Transition of Care [facility]) CM/SW Contact:    Erenest Rasher, RN Phone Number: [phone] 04/15/2022, 5:57 PM  Clinical Narrative:                  HF TOC CM spoke to pt and states she has scale at home. Educated on the importance of daily weights. She drives to her appts. Will continue to follow for dc needs.     Expected Discharge Plan: Home/Self Care Barriers to Discharge: Continued Medical Work up   Patient Goals and CMS Choice   CMS Medicare.gov Compare Post Acute Care list provided to:: Patient    Expected Discharge Plan and Services Expected Discharge Plan: Home/Self Care   Discharge Planning Services: CM Consult   Living arrangements for the past 2 months: Single Family Home                                      Prior Living Arrangements/Services Living arrangements for the past 2 months: Single Family Home Lives with:: Self Patient language and need for interpreter reviewed:: Yes        Need for Family Participation in Patient Care: No (Comment) Care giver support system in place?: Yes (comment) Current home services: DME (cane) Criminal Activity/Legal Involvement Pertinent to Current Situation/Hospitalization: No - Comment as needed  Activities of Daily Living Home Assistive Devices/Equipment: Cane (specify quad or straight) ADL Screening (condition at time of admission) Patient's cognitive ability adequate to safely complete daily activities?: Yes Is the patient deaf or have difficulty hearing?: No Does the patient have difficulty seeing, even when wearing glasses/contacts?: No Does the patient have difficulty concentrating, remembering, or making decisions?: No Patient able to express need for assistance with ADLs?: Yes Does the patient have difficulty dressing or bathing?: No Independently performs ADLs?:  Yes (appropriate for developmental age) Does the patient have difficulty walking or climbing stairs?: Yes Weakness of Legs: Both Weakness of Arms/Hands: Both  Permission Sought/Granted Permission sought to share information with : Case Manager, Family Supports, PCP Permission granted to share information with : Yes, Verbal Permission Granted  Share Information with NAME: Vida Rigger     Permission granted to share info w Relationship: daughter  Permission granted to share info w Contact Information: [phone]  Emotional Assessment Appearance:: Appears stated age Attitude/Demeanor/Rapport: Engaged Affect (typically observed): Accepting Orientation: : Oriented to Place, Oriented to Self, Oriented to  Time, Oriented to Situation   Psych Involvement: No (comment)  Admission diagnosis:  CHF (congestive heart failure) ([place]) [I50.9] Patient Active Problem List   Diagnosis Date Noted   CHF (congestive heart failure) ([place]) 04/14/2022   Acute on chronic combined systolic and diastolic CHF (congestive heart failure) ([place]) 04/14/2022   Sensorineural hearing loss (SNHL) of left ear with unrestricted hearing of right ear 06/11/2021   Tinnitus, bilateral 06/11/2021   Status post gamma knife treatment 06/06/2021   Arthritis 04/08/2021   Hypertension 04/08/2021   Unsteady gait 04/08/2021   PONV (postoperative nausea and vomiting) 04/08/2021   Anemia 12/06/2019   Fecal occult blood test positive 12/06/2019   Vertigo 08/29/2019   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[place]) 04/20/2019   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   Numbness and tingling in both hands 08/18/2017   Left ankle pain 04/20/2015   Arthritis of  knee, right 02/15/2015   Right knee pain 01/17/2015   A-fib ([place]) 28/00/3491   Chronic systolic CHF (congestive heart failure) ([place]) 11/29/2013   Dyslipidemia 08/01/2013   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/29/13 08/01/2013   Cardiomyopathy- etiology not yet determined- EF 25-30% 08/01/2013    Morbid obesity- suspected sleep apnea 07/29/2013   NSVT (nonsustained ventricular tachycardia) ([place]) 07/29/2013   Diabetes mellitus type 2 in obese ([place]) 07/29/2013   Chest pain 07/28/2013   ACS (acute coronary syndrome) ([place]) 07/28/2013   Atrial fibrillation with rapid ventricular response ([place]) 07/28/2013   PCP:  Windell Hummingbird, PA-C Pharmacy:   [id number]: [phone]: [phone]    Social Determinants of Health (SDOH) Interventions    Readmission Risk Interventions     No data to display

## 2022-04-16 ENCOUNTER — Encounter (HOSPITAL_COMMUNITY): Payer: Self-pay | Admitting: Cardiology

## 2022-04-16 ENCOUNTER — Other Ambulatory Visit (HOSPITAL_COMMUNITY): Payer: Self-pay

## 2022-04-16 DIAGNOSIS — I5033 Acute on chronic diastolic (congestive) heart failure: Secondary | ICD-10-CM | POA: Diagnosis not present

## 2022-04-16 DIAGNOSIS — I110 Hypertensive heart disease with heart failure: Secondary | ICD-10-CM | POA: Diagnosis not present

## 2022-04-16 LAB — BASIC METABOLIC PANEL
Anion gap: 10 (ref 5–15)
BUN: 23 mg/dL (ref 8–23)
CO2: 28 mmol/L (ref 22–32)
Calcium: 9.4 mg/dL (ref 8.9–10.3)
Chloride: 99 mmol/L (ref 98–111)
Creatinine, Ser: 1.3 mg/dL — ABNORMAL HIGH (ref 0.44–1.00)
GFR, Estimated: 46 mL/min — ABNORMAL LOW (ref >60)
Glucose, Bld: 121 mg/dL — ABNORMAL HIGH (ref 70–99)
Potassium: 3.7 mmol/L (ref 3.5–5.1)
Sodium: 137 mmol/L (ref 135–145)

## 2022-04-16 LAB — LIPOPROTEIN A (LPA): Lipoprotein (a): 458 nmol/L — ABNORMAL HIGH (ref <75.0)

## 2022-04-16 LAB — MAGNESIUM: Magnesium: 2.4 mg/dL (ref 1.7–2.4)

## 2022-04-16 MED ORDER — TORSEMIDE 20 MG PO TABS: 80.0000 mg | ORAL_TABLET | Freq: Every day | ORAL | Status: DC

## 2022-04-16 MED ORDER — POTASSIUM CHLORIDE CRYS ER 20 MEQ PO TBCR
40.0000 meq | EXTENDED_RELEASE_TABLET | Freq: Once | ORAL | Status: AC
  Administered 2022-04-16: 40 meq via ORAL
  Filled 2022-04-16: qty 2

## 2022-04-16 MED ORDER — TORSEMIDE 20 MG PO TABS: 80.0000 mg | ORAL_TABLET | Freq: Every day | ORAL | 3 refills | Status: DC

## 2022-04-16 NOTE — Progress Notes (Addendum)
Advanced Heart Failure Rounding Note  PCP-Cardiologist: None   Subjective:   Yesterday diuresed with IV lasix. I/O not accurate due to incontinence. .  Weight down another 4 pounds.    Wants to go home. Had a hard time sleeping.   R/LHC, 04/14/22: -Mild CAD, elevated right and left filling pressures, pulmonary venous hypertension, peak to peak aortic valve gradient is 18 mmHg  TEE, 04/14/22: EF 55-60%, RV okay, moderate LAE, moderate AI, severe AS with mean gradient of 40 mmHg, AVA by VTI 0.86 cm2 and 0.71 cm2 by planimetry  Objective:   Weight Range: (!) 159 kg Body mass index is 56.59 kg/m.   Vital Signs:   Temp:  [97.7 F (36.5 C)-98.9 F (37.2 C)] 98.9 F (37.2 C) (11/29 0729) Pulse Rate:  [72-97] 82 (11/29 0729) Resp:  [20] 20 (11/29 0729) BP: (90-132)/(59-81) 90/59 (11/29 0729) SpO2:  [91 %-97 %] 93 % (11/29 0729) Weight:  [159 kg] 159 kg (11/29 0541) Last BM Date : 04/15/22  Weight change: Filed Weights   04/14/22 1241 04/15/22 0505 04/16/22 0541  Weight: (!) 162.4 kg (!) 160.8 kg (!) 159 kg    Intake/Output:   Intake/Output Summary (Last 24 hours) at 04/16/2022 0759 Last data filed at 04/16/2022 0048 Gross per 24 hour  Intake 2997 ml  Output 2300 ml  Net 697 ml      Physical Exam   General:  No resp difficulty HEENT: normal Neck: supple. no JVD. Carotids 2+ bilat; no bruits. No lymphadenopathy or thryomegaly appreciated. Cor: PMI nondisplaced. Irregular rate & rhythm. No rubs, gallop. RUSB murmur. Lungs: clear Abdomen: obese, soft, nontender, nondistended. No hepatosplenomegaly. No bruits or masses. Good bowel sounds. Extremities: no cyanosis, clubbing, rash, edema Neuro: alert & orientedx3, cranial nerves grossly intact. moves all 4 extremities w/o difficulty. Affect pleasant  Telemetry  She took the monitor off last night.   Labs    CBC Recent Labs    04/14/22 0917 04/14/22 1629  WBC  --  5.2  NEUTROABS  --  3.1  HGB 10.9*   11.2* 11.9*  HCT 32.0*  33.0* 36.8  MCV  --  90.6  PLT  --  967   Basic Metabolic Panel Recent Labs    04/15/22 0041 04/16/22 0050  NA 139 137  K 3.4* 3.7  CL 103 99  CO2 27 28  GLUCOSE 121* 121*  BUN 18 23  CREATININE 1.13* 1.30*  CALCIUM 9.2 9.4  MG  --  2.4   Liver Function Tests Recent Labs    04/14/22 1629  AST 21  ALT 14  ALKPHOS 99  BILITOT 0.8  PROT 8.2*  ALBUMIN 4.2   No results for input(s): "LIPASE", "AMYLASE" in the last 72 hours. Cardiac Enzymes No results for input(s): "CKTOTAL", "CKMB", "CKMBINDEX", "TROPONINI" in the last 72 hours.  BNP: BNP (last 3 results) Recent Labs    11/28/21 1541 04/08/22 1221 04/14/22 1629  BNP 110.6* 191.9* 144.6*    ProBNP (last 3 results) No results for input(s): "PROBNP" in the last 8760 hours.   D-Dimer No results for input(s): "DDIMER" in the last 72 hours. Hemoglobin A1C No results for input(s): "HGBA1C" in the last 72 hours. Fasting Lipid Panel No results for input(s): "CHOL", "HDL", "LDLCALC", "TRIG", "CHOLHDL", "LDLDIRECT" in the last 72 hours. Thyroid Function Tests Recent Labs    04/14/22 1629  TSH 0.932    Other results:   Imaging    No results found.   Medications:  Scheduled Medications:  apixaban  5 mg Oral BID   cholecalciferol  5,000 Units Oral Daily   empagliflozin  10 mg Oral QAC breakfast   ezetimibe  5 mg Oral Daily   ferrous sulfate  325 mg Oral BID   furosemide  80 mg Intravenous BID   metoprolol succinate  100 mg Oral BID   omega-3 acid ethyl esters  1,000 mg Oral q morning   rosuvastatin  40 mg Oral QHS   sodium chloride flush  3 mL Intravenous Q12H   sodium chloride flush  3 mL Intravenous Q12H   sodium chloride flush  3 mL Intravenous Q12H   [name]  40 mg Oral BID    Infusions:  sodium chloride     sodium chloride     sodium chloride Stopped (04/14/22 1259)   sodium chloride     sodium chloride      PRN Medications: sodium  chloride, sodium chloride, sodium chloride, acetaminophen, ondansetron (ZOFRAN) IV, sodium chloride flush, sodium chloride flush, sodium chloride flush   Assessment/Plan   1. Acute on chronic diastolic CHF: Nonischemic cardiomyopathy.  EF 35-40% on TEE in 6/15 but then EF back to 60-65% by 10/15 and remained normal on echo in 8/19 and in 10/22.  LHC without significant coronary disease in 3/15.  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  NYHA class III symptoms, she is volume overloaded on exam and by [place] 11/27.  Aortic stenosis additionally now appears to be severe.  -Volume status improved. Stop IV lasix and start torsemide 80 mg daily tomorrow.  - Continue Toprol XL 100 mg BID - Continue empagliflozin 10 mg daily.  - Unable to tolerate spironolactone - Holding off on ARB/ARNi due to severe AS 2. 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  Atrial fibrillation and carpal tunnel syndrome, both of which she has, go along with amyloidosis.  Her history of orthostatic symptoms as well as symptoms in toes and feet consistent with peripheral neuropathy are also likely related to amyloidosis.  - Continue tafamidis.   - Pt declined vutrisiran.  - She was seen by  Dr. 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 Given autosomal dominant condition, Elyanna's daughter is at a 50% risk of inheriting the pathogenic variant in TTR (V142I). Genetic testing for the familial pathogenic variant was strongly recommended but pt's daughter declined.  3. Atrial fibrillation: Last DCCV in 2/20 but back in atrial fibrillation since then.  Dr. Rayann Heman saw, decided against atrial fibrillation ablation due to  morbid obesity.  EP decided against Tikosyn due to compliance concerns. She failed amiodarone.  She remains in atrial fibrillation, I think that the atrial fibrillation is likely permanent at this point.  Rate controlled.  - Continue Toprol XL 100 mg bid.  - Continue Eliquis.  Denies abnormal bleeding.  4. CAD: Mild, nonobstructive by cath today. No chest pain.  - No ASA given Eliquis use.   - Continue crestor 40 mg daily and Zetia. 4. Bilateral carpal tunnel symptoms: Likely related to amyloidosis.  5. OSA: She says that she cannot tolerate CPAP due to tinnitus.  6. HTN: BP controlled.  7. Aortic stenosis: Echo in 11/23 was concerning for paradoxical low flow/low gradient severe AS.  This is relatively common with  TTR amyloidosis. TEE 04/14/22 showed severe AS with mean gradient 40 mmHg, AVA 0.86 by VTI and 0.71 by planimetry with moderate aortic regurgitation. The valve appeared trileaflet but the right cusp appeared to dome.  Interestingly, cath peak-to-peak aortic gradient was only 18 mmHg.   - Do not think she would be a good SAVR candidate with obesity and comorbidities.  Will refer to structural heart after discharge for TAVR evaluation.  Obesity will make groin vascular access difficult.  8. Obesity: She would benefit from semaglutide but does not want injection.  Would recommend Rybelsus. Dr. Aundra Dubin referred her to pharmacy clinic for this.   Home today. She does not want PT.   Length of Stay: 0  Darrick Grinder, NP  04/16/2022, 7:59 AM  Advanced Heart Failure Team Pager [phone] (M-F; 7a - 5p)  Please contact [facility] for night-coverage after hours (5p -7a ) and weekends on [email or web address]   Patient seen with NP, agree with the above note.    Weight down 4 lbs.  Breathing better.  Creatinine mildly higher at 1.3.   General: NAD, obese.  Neck: JVP 8 cm, no thyromegaly or thyroid nodule.  Lungs: Clear to auscultation bilaterally with normal respiratory effort. CV: Nondisplaced PMI.   Heart regular S1/S2, no S3/S4, 3/6 SEM RUSB.  No peripheral edema.   Abdomen: Soft, nontender, no hepatosplenomegaly, no distention.  Skin: Intact without lesions or rashes.  Neurologic: Alert and oriented x 3.  Psych: Normal affect. Extremities: No clubbing or cyanosis.  HEENT: Normal.   Good diuresis yesterday, weight down again.  Wants to go home.   I think she can go home toady.  She will go home on torsemide 80 mg daily and KCl 60 mEq daily.  She will need close followup in [facility].  She also has been referred to structural heart clinic for TAVR evaluation with severe AS.  [name] 04/16/2022 9:26 AM

## 2022-04-16 NOTE — TOC Transition Note (Signed)
Transition of Care [facility]) - CM/SW Discharge Note   Patient Details  Name: Lashawnta Burgert MRN: [id number] Date of Birth: February 01, 1956  Transition of Care [facility]) CM/SW Contact:  Zenon Mayo, RN Phone Number: 04/16/2022, 10:20 AM   Clinical Narrative:     Patient followed by previous HF NCM, for dc ,no needs.    Barriers to Discharge: Continued Medical Work up   Patient Goals and CMS Choice   CMS Medicare.gov Compare Post Acute Care list provided to:: Patient    Discharge Placement                       Discharge Plan and Services   Discharge Planning Services: CM Consult                                 Social Determinants of Health (SDOH) Interventions     Readmission Risk Interventions     No data to display

## 2022-04-16 NOTE — Progress Notes (Signed)
Pt received CRPII brochure.

## 2022-04-16 NOTE — Progress Notes (Signed)
Patient not on telemetry during morning rounds. Patient refused placement. States she was tired of the alarms overnight. Will attempt to place patient back on telemetry this morning.

## 2022-04-16 NOTE — Progress Notes (Signed)
   04/16/22 0729  Vitals  Temp 98.9 F (37.2 C)  Temp Source Oral  BP (!) 90/59  MAP (mmHg) 70  BP Location Right Wrist  BP Method Automatic  Patient Position (if appropriate) Lying  Pulse Rate 82  Pulse Rate Source Monitor  Resp 20  MEWS COLOR  MEWS Score Color Green  Oxygen Therapy  SpO2 93 %  O2 Device Room Air  MEWS Score  MEWS Temp 0  MEWS Systolic 1  MEWS Pulse 0  MEWS RR 0  MEWS LOC 0  MEWS Score 1

## 2022-04-17 ENCOUNTER — Telehealth: Payer: Self-pay | Admitting: Pharmacist

## 2022-04-17 MED ORDER — RYBELSUS 3 MG PO TABS: 1.0000 | ORAL_TABLET | Freq: Every day | ORAL | 5 refills | Status: DC

## 2022-04-17 NOTE — Telephone Encounter (Signed)
Spoke with pt. She does not wish to give herself injections but she is agreeable to try oral Rybelsus. Discussed that weight loss data is worse with oral semaglutide vs injectable. PIONEER 4 trial showed ~5% weight loss with Rybelsus '14mg'$  daily dosing compared to 10-15% weight loss with Ozempic injections. She wishes to try Rybelsus. I see Medicare insurance on file, pt states her Vania Rea is free from the pharmacy.  Rx for Rybelsus '3mg'$  daily sent to pharmacy, copay is $0 however pharmacy states availability has been hit or miss for all strengths.  A1c very well controlled at 6.3% on Jardiance. Pt will start Rybelsus '3mg'$  daily with dose to be taken at least 30 minutes before first food of the day. Discussed diet and exercise on the phone, as well as MOA of Rybelsus and need to watch portions and diet while on med to help reduce risk of GI side effects. Will call pt in 1 month to assess tolerability and increase Rybelsus dose. Will plan to recheck A1c after she's been on Rybelsus for 3-4 months. Pt appreciative for the assistance.

## 2022-04-17 NOTE — Telephone Encounter (Signed)
-----   Message from Jerl Mina, RN sent at 04/17/2022  9:00 AM EST ----- Semagluitde please  she is diabetic

## 2022-04-21 ENCOUNTER — Encounter: Payer: Self-pay | Admitting: Internal Medicine

## 2022-04-21 ENCOUNTER — Other Ambulatory Visit: Payer: Self-pay

## 2022-04-21 ENCOUNTER — Ambulatory Visit: Payer: Medicare HMO | Attending: Internal Medicine | Admitting: Internal Medicine

## 2022-04-21 VITALS — BP 112/70 | HR 87 | Ht 66.0 in | Wt 349.0 lb

## 2022-04-21 DIAGNOSIS — I4891 Unspecified atrial fibrillation: Secondary | ICD-10-CM | POA: Diagnosis not present

## 2022-04-21 DIAGNOSIS — E854 Organ-limited amyloidosis: Secondary | ICD-10-CM | POA: Diagnosis not present

## 2022-04-21 DIAGNOSIS — I152 Hypertension secondary to endocrine disorders: Secondary | ICD-10-CM

## 2022-04-21 DIAGNOSIS — Z6841 Body Mass Index (BMI) 40.0 and over, adult: Secondary | ICD-10-CM

## 2022-04-21 DIAGNOSIS — I5022 Chronic systolic (congestive) heart failure: Secondary | ICD-10-CM | POA: Diagnosis not present

## 2022-04-21 DIAGNOSIS — I350 Nonrheumatic aortic (valve) stenosis: Secondary | ICD-10-CM

## 2022-04-21 DIAGNOSIS — E1169 Type 2 diabetes mellitus with other specified complication: Secondary | ICD-10-CM

## 2022-04-21 DIAGNOSIS — I43 Cardiomyopathy in diseases classified elsewhere: Secondary | ICD-10-CM

## 2022-04-21 DIAGNOSIS — E669 Obesity, unspecified: Secondary | ICD-10-CM

## 2022-04-21 DIAGNOSIS — E1159 Type 2 diabetes mellitus with other circulatory complications: Secondary | ICD-10-CM

## 2022-04-21 DIAGNOSIS — R0609 Other forms of dyspnea: Secondary | ICD-10-CM

## 2022-04-21 DIAGNOSIS — E785 Hyperlipidemia, unspecified: Secondary | ICD-10-CM

## 2022-04-21 NOTE — Progress Notes (Addendum)
Pre Surgical Assessment: 5 M Walk Test  24M=16.63f  5 Meter Walk Test- trial 1: 13.50 seconds 5 Meter Walk Test- trial 2: 10.40 seconds 5 Meter Walk Test- trial 3: 10.30 seconds 5 Meter Walk Test Average:  11.40 seconds  Patient used a cane during walk test.   STS Risk calculation: Procedure Type: Isolated AVR Perioperative Outcome Estimate % Operative Mortality 3.26% Morbidity & Mortality 12.2% Stroke 0.432% Renal Failure 3.65% Reoperation 2.67% Prolonged Ventilation 8.01% Deep Sternal Wound Infection 0.254% [facility] [facility] (>14 days) 7.19% Short Hospital Stay (<6 days) 24.1%

## 2022-04-21 NOTE — Patient Instructions (Signed)
Medication Instructions:  Your physician recommends that you continue on your current medications as directed. Please refer to the Current Medication list given to you today.  *If you need a refill on your cardiac medications before your next appointment, please call your pharmacy*   Follow-Up: At [facility], you and your health needs are our priority.  As part of our continuing mission to provide you with exceptional heart care, we have created designated Provider Care Teams.  These Care Teams include your primary Cardiologist (physician) and Advanced Practice Providers (APPs -  Physician Assistants and Nurse Practitioners) who all work together to provide you with the care you need, when you need it.  We recommend signing up for the patient portal called "MyChart".  Sign up information is provided on this After Visit Summary.  MyChart is used to connect with patients for Virtual Visits (Telemedicine).  Patients are able to view lab/test results, encounter notes, upcoming appointments, etc.  Non-urgent messages can be sent to your provider as well.   To learn more about what you can do with MyChart, go to [email or web address].    Your next appointment:   As scheduled by the Team  Other Instructions You have been referred to dentist and they will call you to set up appointment

## 2022-04-21 NOTE — Progress Notes (Signed)
Patient ID: Kimberly [name] MRN: [id number] DOB/AGE: 65-28-1957 65 y.o.  Primary Care Physician:Taylor, Estill Bamberg, [place] Primary Cardiologist: [name], MD   FOCUSED CARDIOVASCULAR PROBLEM LIST:   1.  Severe aortic stenosis and moderate aortic regurgitation with an aortic valve area of 0.71 cm2, mean gradient of 49 mmHg, and ejection fraction of 55 to 60%; EKG demonstrates atrial fibrillation without conduction abnormalities 2.  Mild obstructive coronary artery disease 3.  Paroxysmal atrial fibrillation on apixaban 4.  Nonischemic cardiomyopathy with ejection fraction of 35 to 40% in 2015 now normalized 5.  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  BMI >50 7.  Left vestibular schwannoma   HISTORY OF PRESENT ILLNESS: The patient is a 66 y.o. female with the indicated medical history here for recommendations regarding her severe aortic valvular disease.  The patient recently underwent an elective TEE to evaluate her aortic valve stenosis.  She was found to be volume overloaded at time and admitted for diuresis.  She lost 16 pounds with IV Lasix and was discharged home with a increased dose of torsemide.  The patient endorses NYHA class II symptoms of shortness of breath.  She feels however better than she did prior to the hospital.  She endorses stable two-pillow orthopnea.  She denies any peripheral edema.  She denies any exertional angina.  She denies any presyncope or syncope.  In terms of her dental health she has not seen a dentist in some time and fears that she might have teeth that need to be extracted.  She has problems with knee pain and so she is not very active.  She tells me when she walks upstairs she will get short of breath.  She has not had any shortness of breath at rest since discharge.  She occasionally does develop vertigo likely related to her schwannoma.  Past Medical History:  Diagnosis Date   Aortic stenosis     Arthritis    KNEES   Atrial fibrillation (HCC)    Complication of anesthesia    Diabetes ([place])    HLD (hyperlipidemia)    Left atrial thrombus    Morbid obesity (HCC)    NICM (nonischemic cardiomyopathy) ([place])    EF 30%   PONV (postoperative nausea and vomiting)    Unilateral vestibular schwannoma ([place]) 08/29/2019   left    Past Surgical History:  Procedure Laterality Date   CARDIOVERSION N/A 07/29/2013   Procedure: CARDIOVERSION;  Surgeon: [name], MD;  Location: [place];  Service: Cardiovascular;  Laterality: N/A;   CARDIOVERSION N/A 11/11/2013   Procedure: CARDIOVERSION;  Surgeon: [name], MD;  Location: [facility] ENDOSCOPY;  Service: Cardiovascular;  Laterality: N/A;   CARDIOVERSION N/A 12/08/2013   Procedure: CARDIOVERSION;  Surgeon: [name], MD;  Location: [facility] ENDOSCOPY;  Service: Cardiovascular;  Laterality: N/A;   CARDIOVERSION N/A 06/30/2018   Procedure: CARDIOVERSION;  Surgeon: [name], MD;  Location: [facility] ENDOSCOPY;  Service: Cardiovascular;  Laterality: N/A;   JOINT REPLACEMENT     LEFT HEART CATHETERIZATION WITH CORONARY ANGIOGRAM N/A 08/01/2013   Procedure: LEFT HEART CATHETERIZATION WITH CORONARY ANGIOGRAM;  Surgeon: Blane Ohara, MD;  Location: [facility] CATH LAB;  Service: Cardiovascular;  Laterality: N/A;   RIGHT/LEFT HEART CATH AND CORONARY ANGIOGRAPHY N/A 04/14/2022   Procedure: RIGHT/LEFT HEART CATH AND CORONARY ANGIOGRAPHY;  Surgeon: [name], MD;  Location: [place] CV LAB;  Service: Cardiovascular;  Laterality: N/A;   TEE WITHOUT CARDIOVERSION N/A 07/29/2013  Procedure: TRANSESOPHAGEAL ECHOCARDIOGRAM (TEE);  Surgeon: [name], MD;  Location: [place];  Service: Cardiovascular;  Laterality: N/A;   TEE WITHOUT CARDIOVERSION N/A 11/11/2013   Procedure: TRANSESOPHAGEAL ECHOCARDIOGRAM (TEE);  Surgeon: [name], MD;  Location: [place];  Service: Cardiovascular;  Laterality: N/A;   TEE WITHOUT CARDIOVERSION N/A  04/14/2022   Procedure: TRANSESOPHAGEAL ECHOCARDIOGRAM (TEE);  Surgeon: [name], MD;  Location: [facility] ENDOSCOPY;  Service: Cardiovascular;  Laterality: N/A;    Family History  Problem Relation Age of Onset   CVA Mother     Social History   Socioeconomic History   Marital status: Single    Spouse name: Not on file   Number of children: Not on file   Years of education: Not on file   Highest education level: Not on file  Occupational History   Not on file  Tobacco Use   Smoking status: Never   Smokeless tobacco: Never  Vaping Use   Vaping Use: Never used  Substance and Sexual Activity   Alcohol use: No    Alcohol/week: 0.0 standard drinks of alcohol   Drug use: No   Sexual activity: Not Currently  Other Topics Concern   Not on file  Social History Narrative   Right handed    One story home alone   Caffeine 2 cups a day   Social Determinants of Health   Financial Resource Strain: Not on file  Food Insecurity: No Food Insecurity (04/14/2022)   Hunger Vital Sign    Worried About Running Out of Food in the Last Year: Never true    Ran Out of Food in the Last Year: Never true  Transportation Needs: No Transportation Needs (04/14/2022)   PRAPARE - Hydrologist (Medical): No    Lack of Transportation (Non-Medical): No  Physical Activity: Not on file  Stress: Not on file  Social Connections: Not on file  Intimate Partner Violence: Not At Risk (04/14/2022)   Humiliation, Afraid, Rape, and Kick questionnaire    Fear of Current or Ex-Partner: No    Emotionally Abused: No    Physically Abused: No    Sexually Abused: No     Prior to Admission medications   Medication Sig Start Date End Date Taking? Authorizing Provider  acetaminophen (TYLENOL) 650 MG CR tablet Take 1,300 mg by mouth every 8 (eight) hours as needed for pain.     [provider]  benzonatate (TESSALON) 100 MG capsule Take 100 mg by mouth 3 (three) times daily as  needed. 04/01/21   [provider]  Cholecalciferol (VITAMIN D-3) 5000 UNITS TABS Take 5,000 Units by mouth daily.    [provider]  Docusate Sodium (DSS) 100 MG CAPS Take by mouth as needed.    [provider]  ELIQUIS 5 MG TABS tablet TAKE 1 TABLET (5 MG TOTAL) BY MOUTH 2 (TWO) TIMES DAILY. Patient taking differently: Take 5 mg by mouth 2 (two) times daily. 06/07/21   [name], MD  ezetimibe (ZETIA) 10 MG tablet Take 0.5 tablets (5 mg total) by mouth daily. Patient not taking: Reported on 04/14/2022 11/28/21 11/28/22  Consuelo Pandy, PA-C  Ferrous Sulfate 27 MG TABS Take 27 mg by mouth 2 (two) times daily.     [provider]  fluticasone furoate-vilanterol (BREO ELLIPTA) 100-25 MCG/INH AEPB Inhale 1 puff into the lungs every morning. As needed 06/01/20   [provider]  JARDIANCE 10 MG TABS tablet TAKE 1  TABLET BY MOUTH DAILY BEFORE BREAKFAST. Patient taking differently: Take 10 mg by mouth daily. 07/22/21   [place], Maricela Bo, FNP  ketoconazole (NIZORAL) 2 % cream Apply 1 application topically as needed. 12/06/19   [provider]  metoprolol succinate (TOPROL-XL) 100 MG 24 hr tablet Take 1 tablet (100 mg total) by mouth 2 (two) times daily. 12/09/21   [name], MD  nitroGLYCERIN (NITROSTAT) 0.4 MG SL tablet Place 1 tablet (0.4 mg total) under the tongue every 5 (five) minutes as needed for chest pain. 04/21/19   [name], MD  Omega-3 1000 MG CAPS Take 1,000 mg by mouth every morning.    [provider]  potassium chloride (KLOR-CON) 10 MEQ tablet Take 5 tablets (50 mEq total) by mouth daily. 04/08/22   [name], MD  RESTASIS 0.05 % ophthalmic emulsion Place 1 drop into both eyes daily as needed. 06/13/19   [provider]  rosuvastatin (CRESTOR) 40 MG tablet Take 1 tablet (40 mg total) by mouth at bedtime. 04/02/20   [name], MD  Semaglutide (RYBELSUS) 3 MG TABS Take 1 tablet by  mouth daily. 04/17/22   [name], MD  Tafamidis Meglumine, Cardiac, (VYNDAQEL) 20 MG CAPS Take 4 capsules (80 mg)  by mouth daily. Patient taking differently: Take 40 mg by mouth in the morning and at bedtime. 10/02/21   [name], MD  torsemide (DEMADEX) 20 MG tablet Take 4 tablets (80 mg total) by mouth daily. 04/16/22   Clegg, Amy D, NP    Allergies  Allergen Reactions   Tramadol Nausea And Vomiting   Spironolactone Itching    REVIEW OF SYSTEMS:  General: no fevers/chills/night sweats Eyes: no blurry vision, diplopia, or amaurosis ENT: no sore throat or hearing loss Resp: no cough, wheezing, or hemoptysis CV: no edema or palpitations GI: no abdominal pain, nausea, vomiting, diarrhea, or constipation GU: no dysuria, frequency, or hematuria Skin: no rash Neuro: no headache, numbness, tingling, or weakness of extremities Musculoskeletal: no joint pain or swelling Heme: no bleeding, DVT, or easy bruising Endo: no polydipsia or polyuria  BP 112/70   Pulse 87   Ht '5\' 6"'$  (1.676 m)   Wt (!) 349 lb (158.3 kg)   SpO2 97%   BMI 56.33 kg/m   PHYSICAL EXAM: GEN:  AO x 3 in no acute distress HEENT: normal Dentition: Normal Neck: JVP normal. +2carotid upstrokes without bruits. No thyromegaly. Lungs: equal expansion, clear bilaterally CV: Apex is discrete and nondisplaced, RRR with very distant heart sounds Abd: soft, non-tender, non-distended; no bruit; positive bowel sounds Ext: no edema, ecchymoses, or cyanosis Vascular: 2+ femoral pulses, 2+ radial pulses       Skin: warm and dry without rash Neuro: CN II-XII grossly intact; motor and sensory grossly intact    DATA AND STUDIES:  EKG: Atrial fibrillation without conduction disease  TEE:  November 2023 1. Left ventricular ejection fraction, by estimation, is 55 to 60%. The  left ventricle has normal function. There is mild concentric left  ventricular hypertrophy. Left ventricular diastolic parameters are   indeterminate.   2. Right ventricular systolic function is normal. The right ventricular  size is normal.   3. Left atrial size was moderately dilated. No left atrial/left atrial  appendage thrombus was detected.   4. Right atrial size was mildly dilated.   5. The mitral valve is normal in structure. Mild mitral valve  regurgitation. No evidence of mitral stenosis.   6. Peak  RV-RA gradient 48 mmHg. Tricuspid valve regurgitation is mild to  moderate.   7. Doming of the right coronary cusp. The aortic valve is tricuspid.  There is severe calcifcation of the aortic valve. Aortic valve  regurgitation is moderate. Severe aortic valve stenosis. Aortic valve  area, by VTI measures 0.86 cm, 0.71 cm2 by  planimetry. Aortic valve mean gradient measures 40.0 mmHg.   8. PFO noted by color doppler with left to right shunting.   CARDIAC CATH: November 2023 1. Elevated right and left heart filling pressures.  2. Pulmonary venous hypertension. 3. Peak to peak aortic valve gradient 18 mmHg 4. Nonobstructive mild CAD.   STS RISK CALCULATOR: Pending  NHYA CLASS: 2    ASSESSMENT AND PLAN:   Aortic valve stenosis, etiology of cardiac valve disease unspecified - Plan: Ambulatory referral to Dentistry  Chronic systolic CHF (congestive heart failure) (HCC)  Cardiac amyloidosis (HCC)  Atrial fibrillation, unspecified type ([place])  Diabetes mellitus type 2 in obese ([place])  Hypertension associated with diabetes ([place])  Hyperlipidemia associated with type 2 diabetes mellitus ([place])  BMI 50.0-59.9, adult ([place])  The patient has severe aortic valvular disease with a combination of at least moderate to severe aortic stenosis and moderate aortic insufficiency.  The patient endorses NYHA class II symptoms of shortness of breath.  I believe that she would likely be much more symptomatic if she were not limited by her orthopedic issues.  Given her burden of valvular disease I think it makes sense to  evaluate for for valve intervention.  She does have a elevated BMI which may make transcatheter approach problematic.  We will evaluate this based on the patient's TAVR protocol CTA.  Additionally I will refer the patient for a dental evaluation and cardiothoracic surgery opinion.  I have personally reviewed the patients imaging data as summarized above.  I have reviewed the natural history of aortic stenosis with the patient and family members who are present today. We have discussed the limitations of medical therapy and the poor prognosis associated with symptomatic aortic stenosis. We have also reviewed potential treatment options, including palliative medical therapy, conventional surgical aortic valve replacement, and transcatheter aortic valve replacement. We discussed treatment options in the context of this patient's specific comorbid medical conditions.   All of the patient's questions were answered today. Will make further recommendations based on the results of studies outlined above.   Total time spent with patient today 60 minutes. This includes reviewing records, evaluating the patient and coordinating care.   Early Osmond, MD  04/21/2022 1:09 PM    [facility] [place], [place], [place]  [address] Phone: [phone]; Fax: [phone]

## 2022-04-22 ENCOUNTER — Other Ambulatory Visit: Payer: Self-pay

## 2022-04-22 ENCOUNTER — Other Ambulatory Visit (HOSPITAL_COMMUNITY): Payer: Self-pay

## 2022-04-22 ENCOUNTER — Encounter (HOSPITAL_COMMUNITY): Payer: Medicare HMO

## 2022-04-24 ENCOUNTER — Other Ambulatory Visit (HOSPITAL_COMMUNITY): Payer: Self-pay | Admitting: Cardiology

## 2022-04-24 ENCOUNTER — Ambulatory Visit (HOSPITAL_COMMUNITY)
Admit: 2022-04-24 | Discharge: 2022-04-24 | Disposition: A | Discharge status: Home / Self Care | Payer: Medicare HMO | Attending: Cardiology | Admitting: Cardiology

## 2022-04-24 VITALS — BP 140/90 | HR 89 | Wt 350.0 lb

## 2022-04-24 DIAGNOSIS — I110 Hypertensive heart disease with heart failure: Secondary | ICD-10-CM | POA: Diagnosis not present

## 2022-04-24 DIAGNOSIS — I082 Rheumatic disorders of both aortic and tricuspid valves: Secondary | ICD-10-CM | POA: Diagnosis not present

## 2022-04-24 DIAGNOSIS — G4733 Obstructive sleep apnea (adult) (pediatric): Secondary | ICD-10-CM | POA: Diagnosis not present

## 2022-04-24 DIAGNOSIS — Z79899 Other long term (current) drug therapy: Secondary | ICD-10-CM | POA: Diagnosis not present

## 2022-04-24 DIAGNOSIS — G5603 Carpal tunnel syndrome, bilateral upper limbs: Secondary | ICD-10-CM | POA: Diagnosis not present

## 2022-04-24 DIAGNOSIS — I43 Cardiomyopathy in diseases classified elsewhere: Secondary | ICD-10-CM | POA: Diagnosis not present

## 2022-04-24 DIAGNOSIS — I5032 Chronic diastolic (congestive) heart failure: Secondary | ICD-10-CM

## 2022-04-24 DIAGNOSIS — E854 Organ-limited amyloidosis: Secondary | ICD-10-CM | POA: Insufficient documentation

## 2022-04-24 DIAGNOSIS — I5042 Chronic combined systolic (congestive) and diastolic (congestive) heart failure: Secondary | ICD-10-CM | POA: Insufficient documentation

## 2022-04-24 DIAGNOSIS — Z7984 Long term (current) use of oral hypoglycemic drugs: Secondary | ICD-10-CM | POA: Insufficient documentation

## 2022-04-24 DIAGNOSIS — H9319 Tinnitus, unspecified ear: Secondary | ICD-10-CM | POA: Diagnosis not present

## 2022-04-24 DIAGNOSIS — I480 Paroxysmal atrial fibrillation: Secondary | ICD-10-CM | POA: Diagnosis not present

## 2022-04-24 DIAGNOSIS — I2510 Atherosclerotic heart disease of native coronary artery without angina pectoris: Secondary | ICD-10-CM | POA: Insufficient documentation

## 2022-04-24 DIAGNOSIS — Z7901 Long term (current) use of anticoagulants: Secondary | ICD-10-CM | POA: Diagnosis not present

## 2022-04-24 DIAGNOSIS — E1159 Type 2 diabetes mellitus with other circulatory complications: Secondary | ICD-10-CM

## 2022-04-24 DIAGNOSIS — I152 Hypertension secondary to endocrine disorders: Secondary | ICD-10-CM

## 2022-04-24 DIAGNOSIS — I428 Other cardiomyopathies: Secondary | ICD-10-CM | POA: Insufficient documentation

## 2022-04-24 DIAGNOSIS — R0609 Other forms of dyspnea: Secondary | ICD-10-CM

## 2022-04-24 DIAGNOSIS — I5022 Chronic systolic (congestive) heart failure: Secondary | ICD-10-CM | POA: Diagnosis not present

## 2022-04-24 LAB — BASIC METABOLIC PANEL
Anion gap: 8 (ref 5–15)
BUN: 23 mg/dL (ref 8–23)
CO2: 29 mmol/L (ref 22–32)
Calcium: 9.4 mg/dL (ref 8.9–10.3)
Chloride: 102 mmol/L (ref 98–111)
Creatinine, Ser: 1.28 mg/dL — ABNORMAL HIGH (ref 0.44–1.00)
GFR, Estimated: 46 mL/min — ABNORMAL LOW (ref >60)
Glucose, Bld: 95 mg/dL (ref 70–99)
Potassium: 4.4 mmol/L (ref 3.5–5.1)
Sodium: 139 mmol/L (ref 135–145)

## 2022-04-24 LAB — BRAIN NATRIURETIC PEPTIDE: B Natriuretic Peptide: 121.4 pg/mL — ABNORMAL HIGH (ref 0.0–100.0)

## 2022-04-24 NOTE — Patient Instructions (Addendum)
EKG done today.  Labs done today. We will contact you only if your labs are abnormal.  No medication changes were made. Please continue all current medications as prescribed.  Your physician recommends that you schedule a follow-up appointment in: 4 weeks  If you have any questions or concerns before your next appointment please send [place] a message through [place] or call our office at [phone].    TO LEAVE A MESSAGE FOR THE NURSE SELECT OPTION 2, PLEASE LEAVE A MESSAGE INCLUDING: YOUR NAME DATE OF BIRTH CALL BACK NUMBER REASON FOR CALL**this is important as we prioritize the call backs  YOU WILL RECEIVE A CALL BACK THE SAME DAY AS LONG AS YOU CALL BEFORE 4:00 PM   Do the following things EVERYDAY: Weigh yourself in the morning before breakfast. Write it down and keep it in a log. Take your medicines as prescribed Eat low salt foods--Limit salt (sodium) to 2000 mg per day.  Stay as active as you can everyday Limit all fluids for the day to less than 2 liters   At the [facility], you and your health needs are our priority. As part of our continuing mission to provide you with exceptional heart care, we have created designated Provider Care Teams. These Care Teams include your primary Cardiologist (physician) and Advanced Practice Providers (APPs- Physician Assistants and Nurse Practitioners) who all work together to provide you with the care you need, when you need it.   You may see any of the following providers on your designated Care Team at your next follow up: Dr Glori Bickers Dr Haynes Kerns, NP Lyda Jester, [place] Audry Riles, PharmD   Please be sure to bring in all your medications bottles to every appointment.

## 2022-04-24 NOTE — Progress Notes (Signed)
Date:  04/24/2022   ID:  Kimberly [name], DOB 1956/02/26, MRN [id number]   Provider location: [place] Type of Visit: Established patient   PCP:  Windell Hummingbird, PA-C  Cardiologist:  Dr. Aundra Dubin   History of Present Illness: Kimberly [name] is a 66 y.o. female who has a history of paroxysmal atrial fibrillation, obesity, and nonischemic cardiomyopathy.  She was admitted in 3/15 at [facility] with atrial fibrillation and CHF.  TEE was done in preparation for DCCV, showing EF 25-30% but there was LA thrombus so DCCV was not done.  Plan was for anticoagulation x 3 months followed by TEE-guided DCCV in the OR (due to patient's body habitus).  LHC was also done, showing mild nonobstructive CAD.  Patient was diuresed with IV Lasix in the hospital and started on apixaban.    Patient had TEE again in 6/15, showing EF 35-40% with diffuse hypokinesis and no LAA thrombus.  She was cardioverted to NSR (with difficulty).  I started her at that time on amiodarone.  She then went back into atrial fibrillation.  After she had been loaded on amiodarone, cardioverted her again in 7/15 to NSR.     She had gone back into atrial fibrillation in 1/18 and amiodarone was increased to bid in preparation for DCCV. However, she spontaneously converted to NSR.   PYP scan in 4/19 was grade 3, strongly suggestive of TTR amyloidosis.  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  Echo 8/19 showed EF 60%, moderate LVH, mild aortic stenosis.  Of note, she has carpal tunnel syndrome.  She was started on tafamidis.    She went back into atrial fibrillation and had DCCV in 2/20 but was back in atrial fibrillation a few weeks later when she went to atrial fibrillation clinic.  Dr. Rayann Heman thought that morbid obesity precluded atrial fibrillation ablation and concerns about compliance led them not to start her on Tikosyn.  Instead, it was recommended that she start on CPAP for her OSA,  lose weight, and re-attempt DCCV while on amiodarone.   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  In 7/21, she developed hematochezia and had EGD/colonoscopy which were unrevealing except for internal hemorrhoids.   Echo in 10/22 showed EF 60-65%, normal RV, moderate AS mean gradient 20 mmHg with AVA 1.56 cm^2, mild AI.   Echo 04/08/22  EF 55-60%, mild LVH, mildly decreased RV systolic function with normal RV size, PASP 47 mmHg, paradoxical low flow/low gradient severe AS (mean 25 mmHg, AVA 0.6 cm^2).   Admitted after TEE/LHC/RHC for aortic stenosis workup. TEE showed an abnormal trileaflet aortic valve with doming of the right coronary cusp. There was severe aortic stenosis with moderate aortic insufficiency. LHC showed nonobstructive CAD. RHC showed elevated right and left heart filling pressures with pulmonary venous hypertension. Admitted A/C HFpEF. Diuresed well with IV lasix, weight down 16 lbs since admission. Home diuretics regimen adjusted with torsemide increased to 80 mg daily. [name] She was referred to structural heart team  for possible TAVR.   Today she returns for post hospital follow up. Overall feeling fmuch better. Walking with a cane. Remains SOB with steps. Takes her time when walking. Denies PND/Orthopnea. Not using CPAP. Appetite ok. Following low salt diet.  No fever or chills. Weight at home 336 pounds. Taking all medications.   Labs (4/19): LFTs normal, K 4, creatinine  1.24, TSH normal, immunofixation normal Labs (8/19): TSH normal, K 3.8, creatinine 1.27, LFTs normal, TSH normal Labs (11/19): K 3.8, creatinine 1.21 Labs (2/20): LDL 114, hgb 10.5 Labs (3/20): K 4.4, creatinine 1.42 Labs (4/20): K 4.2, creatinine 1.23 Labs (7/21): K 3.8, creatinine 1.23 Labs (8/21): hgb 11.2 Labs (10/21): creatinine 2.1 Labs (5/22): K 3.5, creatinine 1.46 Labs (8/22): K 3.4, creatinine 1.26 Labs (1/23):  BNP 127, K 4, creatinine 1.4, LDL 112 Labs (4/23): Scr 1.3, K 3.7, LDL 96  Labs (7/23): BNP 110 Labs (04/16/22): K 3.7 Creatinine 1.3    PMH: 1. Obese 2. Atrial fibrillation: Paroxysmal => chronic.  Unable to cardiovert in 3/15 due to LAA thrombus noted on TEE. TEE in 6/15 without LAA thrombus.  Patient had cardioversion to NSR with difficulty but back in atrial fibrillation by 7/15 appt.  She was started on amiodarone and cardioverted in 7/15.   - DCCV in 2/20 but back in atrial fibrillation by 3/20, has been in atrial fibrillation since that time.  3. H/o TKR 4. Nonischemic cardiomyopathy: TEE (3/15) with EF 25-30%, moderate MR, PA systolic pressure 51 mmHg, moderate to severe TR, LA thrombus was noted.   LHC (3/15) with 50% stenosis small PLV.  TEE (6/15) with EF 35-40%, diffuse hypokinesis, mild MR, moderate TR, +PFO, no LA appendage thrombus.  - Echo (10/15) with EF 60-65%.   - ECHO 06/10/2016 EF 60-65% Mild AS - Echo (8/19): EF 60%, moderate LVH, mild aortic stenosis.  - PYP scan in 4/19 was grade 3, strongly suggestive of transthyretin amyloidosis.  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  - Echo (11/21): EF 55-60%, mild LV dilation, RV normal, mild-moderate AS with mean gradient 17 mmHg and AVA 1.07 cm^2.  - Echo (10/22): EF 60-65%, normal RV, moderate AS mean gradient 20 mmHg with AVA 1.56 cm^2, mild AI.  - Echo (11/23): EF 55-60%, mild LVH, mildly decreased RV systolic function with normal RV size, PASP 47 mmHg, paradoxical low flow/low gradient severe AS (mean 25 mmHg, AVA 0.6 cm^2). TEE, 04/14/22: EF 55-60%, RV okay, moderate LAE, moderate AI, severe AS with mean gradient of 40 mmHg, AVA by VTI 0.86 cm2 and 0.71 cm2 by planimetry 5. CAD: Nonobstructive.  LHC (3/15) with 50% stenosis small PLV. R/LHC, 04/14/22: -Mild CAD, elevated right and left filling pressures, pulmonary venous hypertension, peak to peak aortic valve gradient is 18 mmHg 6. PFO 7. Left Breast mass: Was taken for  lumpectomy in 1/18 but mass had resolved.  8. Aortic stenosis: Moderate on 10/22 echo.  - Paradoxical low flow/low gradient severe AS on 11/23 echo.  9. Carpal tunnel syndrome.  10. Vestibular schwannoma s/p gamma knife surgery at [facility].  11. OSA: Not currently using CPAP.  12. Hematochezia: C-scope/EGD in 7/21 showed only internal hemorrhoids.  13. 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    Current Outpatient Medications  Medication Sig Dispense Refill   acetaminophen (TYLENOL) 650 MG CR tablet Take 1,300 mg by mouth every 8 (eight) hours as needed for pain.      benzonatate (TESSALON) 100 MG capsule Take 100 mg by mouth 3 (three) times daily as needed.     Cholecalciferol (VITAMIN D-3) 5000 UNITS TABS Take 5,000 Units by mouth daily.     Docusate Sodium (DSS) 100 MG CAPS Take by mouth as needed.     ELIQUIS 5 MG TABS tablet TAKE 1 TABLET BY MOUTH TWICE A DAY 60 tablet 5   Ferrous Sulfate 27 MG TABS Take 27 mg  by mouth 2 (two) times daily.      fluticasone furoate-vilanterol (BREO ELLIPTA) 100-25 MCG/INH AEPB Inhale 1 puff into the lungs every morning. As needed     JARDIANCE 10 MG TABS tablet TAKE 1 TABLET BY MOUTH DAILY BEFORE BREAKFAST. (Patient taking differently: Take 10 mg by mouth daily.) 90 tablet 3   ketoconazole (NIZORAL) 2 % cream Apply 1 application topically as needed.     metoprolol succinate (TOPROL-XL) 100 MG 24 hr tablet Take 1 tablet (100 mg total) by mouth 2 (two) times daily. 180 tablet 3   nitroGLYCERIN (NITROSTAT) 0.4 MG SL tablet Place 1 tablet (0.4 mg total) under the tongue every 5 (five) minutes as needed for chest pain. 30 tablet 1   Omega-3 1000 MG CAPS Take 1,000 mg by mouth every morning.     potassium chloride (KLOR-CON) 10 MEQ tablet Take 5 tablets (50 mEq total) by mouth daily. 120 tablet 3   RESTASIS 0.05 % ophthalmic emulsion Place 1 drop into both eyes daily as needed.     rosuvastatin (CRESTOR) 40 MG tablet Take 1 tablet (40 mg total) by  mouth at bedtime. 90 tablet 3   Tafamidis Meglumine, Cardiac, (VYNDAQEL) 20 MG CAPS Take 4 capsules (80 mg)  by mouth daily. (Patient taking differently: Take 40 mg by mouth in the morning and at bedtime.) 120 capsule 11   torsemide (DEMADEX) 20 MG tablet Take 4 tablets (80 mg total) by mouth daily. 180 tablet 3   ezetimibe (ZETIA) 10 MG tablet Take 0.5 tablets (5 mg total) by mouth daily. (Patient not taking: Reported on 04/24/2022) 15 tablet 11   Semaglutide (RYBELSUS) 3 MG TABS Take 1 tablet by mouth daily. (Patient not taking: Reported on 04/24/2022) 30 tablet 5   No current facility-administered medications for this encounter.    Allergies:   Tramadol and Spironolactone   Social History:  The patient  reports that she has never smoked. She has never used smokeless tobacco. She reports that she does not drink alcohol and does not use drugs.   Family History:  The patient's family history includes CVA in her mother.   ROS:  Please see the history of present illness.   All other systems are personally reviewed and negative.   Vitals:   BP (!) 140/90   Pulse 89   Wt (!) 158.8 kg (350 lb)   SpO2 98%   BMI 56.49 kg/m  Wt Readings from Last 3 Encounters:  04/24/22 (!) 158.8 kg (350 lb)  04/21/22 (!) 158.3 kg (349 lb)  04/16/22 (!) 159 kg (350 lb 9.6 oz)    PHYSICAL EXAM: General: Walked in the clinic. No resp difficulty HEENT: normal Neck: supple. no JVD. Carotids 2+ bilat; no bruits. No lymphadenopathy or thryomegaly appreciated. Cor: PMI nondisplaced. Irregular rate & rhythm. No rubs, gallops. RUSB  murmurs. Lungs: clear Abdomen: obese, soft, nontender, nondistended. No hepatosplenomegaly. No bruits or masses. Good bowel sounds. Extremities: no cyanosis, clubbing, rash, edema Neuro: alert & orientedx3, cranial nerves grossly intact. moves all 4 extremities w/o difficulty. Affect pleasant  .   Recent Labs: 04/14/2022: ALT 14; B Natriuretic Peptide 144.6; Hemoglobin 11.9;  Platelets 227; TSH 0.932 04/16/2022: BUN 23; Creatinine, Ser 1.30; Magnesium 2.4; Potassium 3.7; Sodium 137  Personally reviewed   Wt Readings from Last 3 Encounters:  04/24/22 (!) 158.8 kg (350 lb)  04/21/22 (!) 158.3 kg (349 lb)  04/16/22 (!) 159 kg (350 lb 9.6 oz)     ASSESSMENT AND PLAN:  1. 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  EF 35-40% on TEE in 6/15 but then EF back to 60-65% by 10/15 and remained normal on echo in 8/19 and in 10/22.  LHC without significant coronary disease in 3/15.  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  NYHA class III  - Volume status stable. Continue torsemide to 80 mg daily and  KCl 50 mEq daily.  Check BMET/BNP.  - Continue Toprol XL 100 mg BID - Continue empagliflozin 10 mg daily.  2. 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  Atrial fibrillation and carpal tunnel syndrome, both of which she has, go along with amyloidosis.  Her history of orthostatic symptoms as well as symptoms in toes and feet consistent with peripheral neuropathy are also likely related to amyloidosis.  - Continue tafamidis.   - Pt declined vutrisiran.  - She was seen by  Dr. 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 Given autosomal dominant condition, Kimberly [name]'s daughter is at a 50% risk of inheriting the pathogenic variant in TTR (V142I). Genetic testing for the familial pathogenic variant was strongly recommended but pt's daughter declined.  3. Atrial fibrillation: Last DCCV in 2/20 but back in atrial fibrillation since then.  Dr. Rayann Heman saw, decided against atrial fibrillation ablation due to morbid obesity.  EP decided against Tikosyn due  to compliance concerns. She failed amiodarone.  She remains in atrial fibrillation, I think that the atrial fibrillation is likely permanent at this point.  Rate controlled w/ ? blocker - Continue Toprol XL 100 mg bid.  - Continue Eliquis.  Denies abnormal bleeding. Check CBC today  4. CAD: Mild, nonobstructive by cath 07/2013. No chest pain.  - No ASA given Eliquis use.   - Continue crestor 40 mg daily and Zetia. Check lipids today.  5. Bilateral carpal tunnel symptoms: Likely related to amyloidosis.  6. OSA: She says that she cannot tolerate CPAP due to tinnitus.  7.  HTN: Elevated but hasn't had all meds. BP controlled.  8.  Aortic stenosis: - TEE 04/14/22 showed severe AS with mean gradient 40 mmHg, AVA 0.86 by VTI and 0.71 by planimetry with moderate aortic regurgitation. The valve appeared trileaflet but the right cusp appeared to dome.  Interestingly, peak-to-peak aortic gradient was only 18 mmHg.   - Do not think she would be a good SAVR candidate with obesity and comorbidities. -Refer to structural heart for TAVR evaluation.  Obesity will make groin vascular access difficult. She has an appointment with Dr Tenny Craw next month.  9. Obesity: She would benefit from semaglutide but does not want injection.  She is starting Rybelsus this week.    Follow up in 4-6 weeks with Dr Aundra Dubin.    Jeanmarie Hubert, NP  04/24/2022  [facility] [address] [facility] [place] [address] [phone] (office) [phone] (fax)

## 2022-04-28 ENCOUNTER — Encounter (HOSPITAL_COMMUNITY): Payer: Self-pay | Admitting: Dentistry

## 2022-04-28 ENCOUNTER — Ambulatory Visit (INDEPENDENT_AMBULATORY_CARE_PROVIDER_SITE_OTHER): Payer: Medicare HMO | Admitting: Dentistry

## 2022-04-28 ENCOUNTER — Telehealth (HOSPITAL_COMMUNITY): Payer: Self-pay

## 2022-04-28 VITALS — BP 84/38 | HR 61 | Temp 97.9°F

## 2022-04-28 DIAGNOSIS — K030 Excessive attrition of teeth: Secondary | ICD-10-CM | POA: Insufficient documentation

## 2022-04-28 DIAGNOSIS — I5022 Chronic systolic (congestive) heart failure: Secondary | ICD-10-CM | POA: Diagnosis not present

## 2022-04-28 DIAGNOSIS — K036 Deposits [accretions] on teeth: Secondary | ICD-10-CM

## 2022-04-28 DIAGNOSIS — K083 Retained dental root: Secondary | ICD-10-CM | POA: Insufficient documentation

## 2022-04-28 DIAGNOSIS — Z01818 Encounter for other preprocedural examination: Secondary | ICD-10-CM

## 2022-04-28 DIAGNOSIS — K0530 Chronic periodontitis, unspecified: Secondary | ICD-10-CM

## 2022-04-28 DIAGNOSIS — K0889 Other specified disorders of teeth and supporting structures: Secondary | ICD-10-CM | POA: Insufficient documentation

## 2022-04-28 DIAGNOSIS — F40232 Fear of other medical care: Secondary | ICD-10-CM

## 2022-04-28 DIAGNOSIS — Z7901 Long term (current) use of anticoagulants: Secondary | ICD-10-CM

## 2022-04-28 DIAGNOSIS — K045 Chronic apical periodontitis: Secondary | ICD-10-CM | POA: Insufficient documentation

## 2022-04-28 DIAGNOSIS — K08109 Complete loss of teeth, unspecified cause, unspecified class: Secondary | ICD-10-CM | POA: Insufficient documentation

## 2022-04-28 DIAGNOSIS — K029 Dental caries, unspecified: Secondary | ICD-10-CM | POA: Insufficient documentation

## 2022-04-28 NOTE — Patient Instructions (Signed)
[facility] Department of Dental Medicine Dr. Tiah Heckel B. Varnell Donate, DMD Phone: [phone] Fax: [phone]     It was a pleasure seeing you today!  Please refer to the information below regarding your dental visit with us.  Do not hesitate to give us a call if any questions or concerns come up after you leave.   Thank you for letting us provide care for you.  If there is anything we can do for you, please let us know.    HEART VALVES AND MOUTH CARE     FACTS: If you have any infection in your mouth, it can infect your heart valve. If you heart valve is infected, you will be seriously ill. Infections in the mouth can be SILENT and do not always cause pain. Examples of infections in the mouth are gum disease, dental cavities and abscesses. Some possible signs of infection are listed below.  There are many other signs as well. Bad breath Bleeding gums Teeth that are sensitive to sweets, hot, and/or cold      WHAT YOU HAVE TO DO: Brush your teeth after meals and at bedtime.  Spend at least 2 minutes brushing well, especially behind your back teeth and all around your teeth that stand alone.  Brush at the gumline also. Do not go to bed without brushing your teeth AND flossing.  If your gums bleed when you brush or floss, do NOT stop brushing or flossing.  Bleeding can be a sign of inflammation or irritation from bacteria.  It usually means that your gums need more attention and better cleaning.  If your dentist or Dr. Carson Meche gave you a prescription mouthwash to use, make sure to use it as directed.  If you run out of the prescription, notify your pharmacy. If you were given any other medications or directions by your dentist, please follow them.  If you did not understand the directions or forget what you were told, please call.  We will be happy to refresh your memory. If you need antibiotics before dental procedures, make sure you take them one hour prior  to every dental visit as directed.  Get a dental check-up every 4-6 months in order to keep your mouth healthy, or to find and treat any new infection. You will most likely need your teeth cleaned or gums treated at the same time. If you are not able to come in for your scheduled appointment, call your dentist as soon as possible to reschedule. If you have a problem in between dental visits, call your dentist.   WE ARE A TEAM.  OUR GOAL IS: HEALTHY MOUTH, HEALTHY HEART.   QUESTIONS?  Call our office during office hours at [phone].   

## 2022-04-28 NOTE — Progress Notes (Signed)
[name] of [facility] Medicine     OUTPATIENT CONSULTATION   PLAN/RECOMMENDATIONS     ASSESSMENT: There are no current signs of acute odontogenic infection including abscess, edema or erythema, or suspicious lesion requiring biopsy.   Caries, retained tooth roots; periodontal concerns that include bone loss, loose teeth, accretions on teeth     RECOMMENDATIONS: Extractions of all indicated teeth to decrease the risk of perioperative and postoperative systemic infection and complications.     PLAN: Discuss case with medical team and coordinate treatment as needed.  Tentative O.R. date for 05/29/22 to complete all dental treatment under general anesthesia for cardiac clearance  Plan to hold Eliquis for 24 hours prior to scheduled surgery time and resume 24 hours following, pending medical team's recommendations.   Discussed in detail all treatment options and recommendations with the patient and they are agreeable to the plan.   Thank you for consulting with [facility] and for the opportunity to participate in this patient's treatment.  Should you have any questions or concerns, please contact the [facility] at [phone].    Service Date:   04/28/2022  Patient Name:   Kimberly [name] Date of Birth:   1955-10-22 Medical Record Number: [id number]  Referring Provider:         Lenna Sciara, MD   HISTORY OF PRESENT ILLNESS: Kimberly [name] is a very pleasant 66 y.o. female with history of atrial fibrillation (on Eliquis), congestive heart failure, HTN, cardiomyopathy, type 2 diabetes mellitus, arthritis, dyslipidemia, hyperlipidemia, morbid obesity who was recently diagnosed with severe aortic stenosis and is anticipating cardiac surgery.   The patient presents today for a medically necessary dental consultation as part of their pre-cardiac surgery work-up.   DENTAL HISTORY: The patient reports that she does not have a dentist that she  sees regularly.  She currently denies any dental/orofacial pain or sensitivity. She does state that she has a couple of loose teeth (points to the lower anterior teeth). Patient is able to manage oral secretions.  Patient denies dysphagia, odynophagia, dysphonia, SOB and neck pain.  Patient denies fever, rigors and malaise.   CHIEF COMPLAINT:  Here for a preoperative dental evaluation.   Patient Active Problem List   Diagnosis Date Noted   CHF (congestive heart failure) ([place]) 04/14/2022   Acute on chronic combined systolic and diastolic CHF (congestive heart failure) ([place]) 04/14/2022   Sensorineural hearing loss (SNHL) of left ear with unrestricted hearing of right ear 06/11/2021   Tinnitus, bilateral 06/11/2021   Status post gamma knife treatment 06/06/2021   Arthritis 04/08/2021   Hypertension 04/08/2021   Unsteady gait 04/08/2021   PONV (postoperative nausea and vomiting) 04/08/2021   Anemia 12/06/2019   Fecal occult blood test positive 12/06/2019   Vertigo 08/29/2019   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   Numbness and tingling in both hands 08/18/2017   Left ankle pain 04/20/2015   Arthritis of knee, right 02/15/2015   Right knee pain 01/17/2015   A-fib ([place]) 15/72/6203   Chronic systolic CHF (congestive heart failure) ([place]) 11/29/2013   Dyslipidemia 08/01/2013   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/29/13 08/01/2013   Cardiomyopathy- etiology not yet determined- EF 25-30% 08/01/2013   Morbid obesity- suspected sleep apnea 07/29/2013   NSVT (nonsustained ventricular tachycardia) ([place]) 07/29/2013   Diabetes mellitus type 2 in obese ([place]) 07/29/2013   Chest pain 07/28/2013   ACS (acute coronary syndrome) ([place]) 07/28/2013   Atrial fibrillation with rapid ventricular  response ([place]) 07/28/2013   Past Medical History:  Diagnosis Date   Aortic stenosis    Arthritis    KNEES   Atrial fibrillation ([place])    Complication of anesthesia     Diabetes ([place])    HLD (hyperlipidemia)    Left atrial thrombus    Morbid obesity ([place])    NICM (nonischemic cardiomyopathy) ([place])    EF 30%   PONV (postoperative nausea and vomiting)    Unilateral vestibular schwannoma ([place]) 08/29/2019   left   Past Surgical History:  Procedure Laterality Date   CARDIOVERSION N/A 07/29/2013   Procedure: CARDIOVERSION;  Surgeon: [name], MD;  Location: [facility];  Service: Cardiovascular;  Laterality: N/A;   CARDIOVERSION N/A 11/11/2013   Procedure: CARDIOVERSION;  Surgeon: [name], MD;  Location: [facility] ENDOSCOPY;  Service: Cardiovascular;  Laterality: N/A;   CARDIOVERSION N/A 12/08/2013   Procedure: CARDIOVERSION;  Surgeon: [name], MD;  Location: [facility] ENDOSCOPY;  Service: Cardiovascular;  Laterality: N/A;   CARDIOVERSION N/A 06/30/2018   Procedure: CARDIOVERSION;  Surgeon: [name], MD;  Location: [facility] ENDOSCOPY;  Service: Cardiovascular;  Laterality: N/A;   JOINT REPLACEMENT     LEFT HEART CATHETERIZATION WITH CORONARY ANGIOGRAM N/A 08/01/2013   Procedure: LEFT HEART CATHETERIZATION WITH CORONARY ANGIOGRAM;  Surgeon: Blane Ohara, MD;  Location: [facility] CATH LAB;  Service: Cardiovascular;  Laterality: N/A;   RIGHT/LEFT HEART CATH AND CORONARY ANGIOGRAPHY N/A 04/14/2022   Procedure: RIGHT/LEFT HEART CATH AND CORONARY ANGIOGRAPHY;  Surgeon: [name], MD;  Location: [place] CV LAB;  Service: Cardiovascular;  Laterality: N/A;   TEE WITHOUT CARDIOVERSION N/A 07/29/2013   Procedure: TRANSESOPHAGEAL ECHOCARDIOGRAM (TEE);  Surgeon: [name], MD;  Location: [place];  Service: Cardiovascular;  Laterality: N/A;   TEE WITHOUT CARDIOVERSION N/A 11/11/2013   Procedure: TRANSESOPHAGEAL ECHOCARDIOGRAM (TEE);  Surgeon: [name], MD;  Location: [place];  Service: Cardiovascular;  Laterality: N/A;   TEE WITHOUT CARDIOVERSION N/A 04/14/2022   Procedure: TRANSESOPHAGEAL ECHOCARDIOGRAM (TEE);  Surgeon: [name], MD;  Location: [facility] ENDOSCOPY;  Service: Cardiovascular;  Laterality: N/A;   Allergies  Allergen Reactions   Tramadol Nausea And Vomiting   Spironolactone Itching   Current Outpatient Medications  Medication Sig Dispense Refill   acetaminophen (TYLENOL) 650 MG CR tablet Take 1,300 mg by mouth every 8 (eight) hours as needed for pain.      benzonatate (TESSALON) 100 MG capsule Take 100 mg by mouth 3 (three) times daily as needed.     Cholecalciferol (VITAMIN D-3) 5000 UNITS TABS Take 5,000 Units by mouth daily.     Docusate Sodium (DSS) 100 MG CAPS Take by mouth as needed.     ELIQUIS 5 MG TABS tablet TAKE 1 TABLET BY MOUTH TWICE A DAY 60 tablet 5   ezetimibe (ZETIA) 10 MG tablet Take 0.5 tablets (5 mg total) by mouth daily. (Patient not taking: Reported on 04/24/2022) 15 tablet 11   Ferrous Sulfate 27 MG TABS Take 27 mg by mouth 2 (two) times daily.      fluticasone furoate-vilanterol (BREO ELLIPTA) 100-25 MCG/INH AEPB Inhale 1 puff into the lungs every morning. As needed     JARDIANCE 10 MG TABS tablet TAKE 1 TABLET BY MOUTH DAILY BEFORE BREAKFAST. (Patient taking differently: Take 10 mg by mouth daily.) 90 tablet 3   ketoconazole (NIZORAL) 2 % cream Apply 1 application topically as needed.     metoprolol succinate (TOPROL-XL) 100 MG 24 hr tablet Take  1 tablet (100 mg total) by mouth 2 (two) times daily. 180 tablet 3   nitroGLYCERIN (NITROSTAT) 0.4 MG SL tablet Place 1 tablet (0.4 mg total) under the tongue every 5 (five) minutes as needed for chest pain. 30 tablet 1   Omega-3 1000 MG CAPS Take 1,000 mg by mouth every morning.     potassium chloride (KLOR-CON) 10 MEQ tablet Take 5 tablets (50 mEq total) by mouth daily. 120 tablet 3   RESTASIS 0.05 % ophthalmic emulsion Place 1 drop into both eyes daily as needed.     rosuvastatin (CRESTOR) 40 MG tablet Take 1 tablet (40 mg total) by mouth at bedtime. 90 tablet 3   Semaglutide (RYBELSUS) 3 MG TABS Take 1 tablet by mouth daily.  (Patient not taking: Reported on 04/24/2022) 30 tablet 5   Tafamidis Meglumine, Cardiac, (VYNDAQEL) 20 MG CAPS Take 4 capsules (80 mg)  by mouth daily. (Patient taking differently: Take 40 mg by mouth in the morning and at bedtime.) 120 capsule 11   torsemide (DEMADEX) 20 MG tablet Take 4 tablets (80 mg total) by mouth daily. 180 tablet 3   No current facility-administered medications for this visit.    LABS:  Lab Results  Component Value Date   WBC 5.2 04/14/2022   HGB 11.9 (L) 04/14/2022   HCT 36.8 04/14/2022   MCV 90.6 04/14/2022   PLT 227 04/14/2022      Component Value Date/Time   NA 139 04/24/2022 1058   K 4.4 04/24/2022 1058   CL 102 04/24/2022 1058   CO2 29 04/24/2022 1058   GLUCOSE 95 04/24/2022 1058   BUN 23 04/24/2022 1058   CREATININE 1.28 (H) 04/24/2022 1058   CALCIUM 9.4 04/24/2022 1058   GFRNONAA 46 (L) 04/24/2022 1058   GFRAA 55 (L) 09/09/2018 1034   Lab Results  Component Value Date   INR 1.28 12/08/2013   INR 1.08 08/01/2013   No results found for: "PTT"  Social History   Socioeconomic History   Marital status: Single    Spouse name: Not on file   Number of children: Not on file   Years of education: Not on file   Highest education level: Not on file  Occupational History   Not on file  Tobacco Use   Smoking status: Never   Smokeless tobacco: Never  Vaping Use   Vaping Use: Never used  Substance and Sexual Activity   Alcohol use: No    Alcohol/week: 0.0 standard drinks of alcohol   Drug use: No   Sexual activity: Not Currently  Other Topics Concern   Not on file  Social History Narrative   Right handed    One story home alone   Caffeine 2 cups a day   Social Determinants of Health   Financial Resource Strain: Not on file  Food Insecurity: No Food Insecurity (04/14/2022)   Hunger Vital Sign    Worried About Running Out of Food in the Last Year: Never true    Ran Out of Food in the Last Year: Never true  Transportation Needs: No  Transportation Needs (04/14/2022)   PRAPARE - Hydrologist (Medical): No    Lack of Transportation (Non-Medical): No  Physical Activity: Not on file  Stress: Not on file  Social Connections: Not on file  Intimate Partner Violence: Not At Risk (04/14/2022)   Humiliation, Afraid, Rape, and Kick questionnaire    Fear of Current or Ex-Partner: No    Emotionally Abused:  No    Physically Abused: No    Sexually Abused: No   Family History  Problem Relation Age of Onset   CVA Mother      REVIEW OF SYSTEMS:  Reviewed with the patient as per HPI. PSYCH:  [+] Dental phobia    VITAL SIGNS:  BP (!) 84/38 (BP Location: Right Arm, Patient Position: Sitting, Cuff Size: Large)   Pulse 61   Temp 97.9 F (36.6 C) (Oral)    PHYSICAL EXAM: GENERAL:  Well-developed, comfortable and in no apparent distress. NEUROLOGICAL:  Alert and oriented to person, place and  time. EXTRAORAL:  Facial symmetry present without any edema or erythema.  No swelling or lymphadenopathy.  TMJ asymptomatic without clicks or crepitations.  INTRAORAL:  Soft tissues appear well-perfused and mucous membranes moist.  FOM and vestibules soft and not raised. Oral cavity without mass or lesion. No signs of infection, parulis, sinus tract, edema or erythema evident upon exam.   DENTAL EXAM: Hard tissue exam completed and charted.    OVERALL IMPRESSION:  Poor remaining dentition.       ORAL HYGIENE:  Poor    PERIODONTAL:  Pink, healthy gingival tissue with blunted papilla with areas of inflamed and erythematous tissue.   Generalized plaque and calculus accumulation. [+] Mobility:  Class III- #22, #23; Class II- #4, #6, #18 CARIES:  #6, #9, #12, #13, #18 (severe decay), #21, #30 (severe decay) RETAINED ROOT TIPS:  #30 PROSTHODONTICS:  Patient denies wearing partial dentures. OCCLUSION:  Unable to adequately assess molar occlusion. Non-functional teeth:  #2, #18 OTHER FINDINGS:   [+]  Attrition/wear:  #5 occlusal, #6-#11 incisal, #12 occlusal, #20-#21 occlusal, #22 and #23 incisal, #28 and #29 occlusal   RADIOGRAPHIC EXAM:  PAN and Full Mouth Series were exposed and interpreted.   Condyles seated bilaterally in fossas.  No evidence of abnormal pathology.  All visualized osseous structures appear WNL.   Missing teeth #'s 1, 3, 15, 16, 17, 19, 24, 25, 26, 27, 31 & 32.  Retained root tip #30.  #2, #18, #28 & #29 drifting mesial. #6 appears supra-erupted.  Generalized moderate horizontal bone loss with areas of localized severe consistent with moderate-severe periodontitis.  Radiographic calculus build-up evident. Severe vertical bone loss distal to #4, #22 & #23 and mesial to #18 & #22. Existing restoration on #2.  Periapical radiolucencies on #4, #6, #18, #22, #23 & #30.  Caries- #38M, #9D, #12D, #34M, #18DO, #73M, #30 all surfaces.  #20 & #21 fractured broken DO surfaces of teeth limited to enamel.     ASSESSMENT:  1.  Severe aortic stenosis 2.  Preoperative dental exam 3.  Long-term use of anticoagulation (on Eliquis) 4.  Missing teeth 5.  Caries 6.  Retained tooth roots 7.  Chronic apical periodontitis 8.  Accretions on teeth 9.  Chronic periodontitis 10.  Loose teeth 11.  Postoperative bleeding risk 12.  Attrition/wear 13.  Dental Phobia   PLAN AND RECOMMENDATIONS: I discussed the risks, benefits, and complications of various scenarios with the patient in relationship to their medical and dental conditions, which included systemic infection such as endocarditis, bacteremia or other serious issues that could potentially occur either before, during or after their anticipated heart surgery if dental/oral concerns are not addressed.  I explained that if any chronic or acute dental/oral infection(s) are addressed and subsequently not maintained following medical optimization and recovery, their risk of the previously mentioned complications are just as high and could  potentially occur postoperatively.  I  explained all significant findings of the dental consultation with the patient including several teeth with bone loss and chronic periodontal infection (gum disease), teeth with cavities and teeth with evidence of chronic infection invading the bone on Xrays and clinically, and the recommended care including extractions of all retained root tips, non-restorable teeth and teeth with chronic periapical infection which includes at least teeth numbers 4, 6, 18, 22, 23 and 30 in order to optimize them for heart surgery from a dental standpoint.  The patient verbalized understanding of all findings, discussion, and recommendations. We then discussed various treatment options to include no treatment, multiple extractions with alveoloplasty, pre-prosthetic surgery as indicated, periodontal therapy, dental restorations, root canal therapy, crown and bridge therapy, implant therapy, and replacement of missing teeth as indicated.  The patient verbalized understanding of all options, and currently wishes to proceed with extractions as recommended in the operating room under general anesthesia. Recommend all treatment be completed in the OR due to complex medical history, postoperative bleeding risk and patient's dental phobia.  We discussed tentative O.R. date for 05/29/2022 which she would be able to use office visit on 1/4-1/5 as updated H&P.  The patient elected to have dental procedure completed after the holidays.  Plan to discuss all findings and recommendations with medical team and coordinate future care as needed.   The patient will need to establish care at a dental office of her choice for routine dental care including replacement of missing teeth as needed, cleanings and exams. Call if any questions or concerns arise before next visit.  All questions and concerns were invited and addressed.  The patient tolerated today's visit well and departed in stable condition.    I  spent in excess of 120 minutes during the conduct of this consultation and >50% of this time involved direct face-to-face encounter for counseling and/or coordination of the patient's care.  Sandi Mariscal, DMD

## 2022-04-28 NOTE — Telephone Encounter (Signed)
Attempted to call patient in regards to Cardiac Rehab - LM on VM 

## 2022-04-30 ENCOUNTER — Telehealth (HOSPITAL_COMMUNITY): Payer: Self-pay

## 2022-04-30 NOTE — Telephone Encounter (Signed)
Pt returned phone call and stated that she is not interested in the cardiac rehab program. Closed referral.

## 2022-05-05 ENCOUNTER — Encounter (HOSPITAL_COMMUNITY): Payer: Medicare HMO

## 2022-05-06 ENCOUNTER — Ambulatory Visit (HOSPITAL_COMMUNITY)
Admission: RE | Admit: 2022-05-06 | Discharge: 2022-05-06 | Disposition: A | Discharge status: Home / Self Care | Payer: Medicare HMO | Source: Ambulatory Visit | Attending: Internal Medicine | Admitting: Internal Medicine

## 2022-05-06 DIAGNOSIS — I350 Nonrheumatic aortic (valve) stenosis: Secondary | ICD-10-CM | POA: Diagnosis present

## 2022-05-06 DIAGNOSIS — R0609 Other forms of dyspnea: Secondary | ICD-10-CM | POA: Diagnosis not present

## 2022-05-06 MED ORDER — METOPROLOL TARTRATE 5 MG/5ML IV SOLN: 5.0000 mg | INTRAVENOUS | Status: DC | PRN

## 2022-05-06 MED ORDER — IOHEXOL 350 MG/ML SOLN
100.0000 mL | Freq: Once | INTRAVENOUS | Status: AC | PRN
  Administered 2022-05-06: 100 mL via INTRAVENOUS

## 2022-05-08 ENCOUNTER — Encounter (HOSPITAL_COMMUNITY): Payer: Medicare HMO

## 2022-05-15 ENCOUNTER — Other Ambulatory Visit (HOSPITAL_COMMUNITY): Payer: Self-pay

## 2022-05-16 ENCOUNTER — Telehealth: Payer: Self-pay | Admitting: Pharmacist

## 2022-05-16 NOTE — Telephone Encounter (Signed)
Called pt to follow up with Rybelsus tolerability and dose titration. No answer, will try back later.

## 2022-05-16 NOTE — Telephone Encounter (Signed)
Spoke with pt. She states she didn't start taking Rybelsus because she didn't know what it was for. Reminded pt of our phone call on 11/3 discussing starting this medication for weight loss and DM. Then pt states pharmacy didn't have med in [place]. Advised pt they should have ordered it for her then. She did not follow up to see. Advised pt to call pharmacy and ask them to fill her rx. I'll call her in another month to touch base.

## 2022-05-20 ENCOUNTER — Other Ambulatory Visit (HOSPITAL_COMMUNITY): Payer: Self-pay

## 2022-05-20 ENCOUNTER — Other Ambulatory Visit: Payer: Self-pay

## 2022-05-20 ENCOUNTER — Telehealth (HOSPITAL_COMMUNITY): Payer: Self-pay | Admitting: Pharmacy Technician

## 2022-05-20 NOTE — Telephone Encounter (Signed)
Patient Advocate Encounter   Received notification from [facility] that prior authorization for Vyndaqel is required.   PA submitted on CoverMyMeds Key  BPPRGLDR Status is pending   Will continue to follow.

## 2022-05-20 NOTE — Telephone Encounter (Signed)
Advanced Heart Failure Patient Advocate Encounter  Prior Authorization for Vyndaqel has been approved.    PA# [id number] Effective dates: 05/19/22 through 05/19/23  Patients co-pay is $11.20   Called and spoke with the patient, she is aware that after this one time co-pay, the medication should be no charge through the remainder of the year. Patient agreed to set up AR account with specialty pharmacy in order to take care of charge.  Charlann Boxer, CPhT

## 2022-05-22 ENCOUNTER — Institutional Professional Consult (permissible substitution): Payer: Medicare HMO | Admitting: Cardiothoracic Surgery

## 2022-05-22 VITALS — BP 116/66 | HR 77 | Resp 20 | Ht 66.0 in | Wt 347.0 lb

## 2022-05-22 DIAGNOSIS — I350 Nonrheumatic aortic (valve) stenosis: Secondary | ICD-10-CM | POA: Diagnosis not present

## 2022-05-23 ENCOUNTER — Ambulatory Visit (HOSPITAL_COMMUNITY)
Admission: RE | Admit: 2022-05-23 | Discharge: 2022-05-23 | Disposition: A | Discharge status: Home / Self Care | Payer: Medicare HMO | Source: Ambulatory Visit | Attending: Family Medicine | Admitting: Family Medicine

## 2022-05-23 ENCOUNTER — Encounter (HOSPITAL_COMMUNITY): Payer: Self-pay

## 2022-05-23 VITALS — BP 124/80 | HR 71 | Wt 351.0 lb

## 2022-05-23 DIAGNOSIS — I4891 Unspecified atrial fibrillation: Secondary | ICD-10-CM

## 2022-05-23 DIAGNOSIS — E859 Amyloidosis, unspecified: Secondary | ICD-10-CM | POA: Insufficient documentation

## 2022-05-23 DIAGNOSIS — I5042 Chronic combined systolic (congestive) and diastolic (congestive) heart failure: Secondary | ICD-10-CM | POA: Diagnosis not present

## 2022-05-23 DIAGNOSIS — I2510 Atherosclerotic heart disease of native coronary artery without angina pectoris: Secondary | ICD-10-CM | POA: Insufficient documentation

## 2022-05-23 DIAGNOSIS — G5603 Carpal tunnel syndrome, bilateral upper limbs: Secondary | ICD-10-CM | POA: Diagnosis not present

## 2022-05-23 DIAGNOSIS — I480 Paroxysmal atrial fibrillation: Secondary | ICD-10-CM | POA: Diagnosis not present

## 2022-05-23 DIAGNOSIS — Z79899 Other long term (current) drug therapy: Secondary | ICD-10-CM | POA: Diagnosis not present

## 2022-05-23 DIAGNOSIS — G629 Polyneuropathy, unspecified: Secondary | ICD-10-CM | POA: Diagnosis not present

## 2022-05-23 DIAGNOSIS — I428 Other cardiomyopathies: Secondary | ICD-10-CM | POA: Diagnosis not present

## 2022-05-23 DIAGNOSIS — I272 Pulmonary hypertension, unspecified: Secondary | ICD-10-CM | POA: Insufficient documentation

## 2022-05-23 DIAGNOSIS — E854 Organ-limited amyloidosis: Secondary | ICD-10-CM

## 2022-05-23 DIAGNOSIS — I110 Hypertensive heart disease with heart failure: Secondary | ICD-10-CM | POA: Insufficient documentation

## 2022-05-23 DIAGNOSIS — I10 Essential (primary) hypertension: Secondary | ICD-10-CM

## 2022-05-23 DIAGNOSIS — Z7984 Long term (current) use of oral hypoglycemic drugs: Secondary | ICD-10-CM | POA: Insufficient documentation

## 2022-05-23 DIAGNOSIS — Z7901 Long term (current) use of anticoagulants: Secondary | ICD-10-CM | POA: Insufficient documentation

## 2022-05-23 DIAGNOSIS — I5022 Chronic systolic (congestive) heart failure: Secondary | ICD-10-CM

## 2022-05-23 DIAGNOSIS — I083 Combined rheumatic disorders of mitral, aortic and tricuspid valves: Secondary | ICD-10-CM | POA: Diagnosis not present

## 2022-05-23 DIAGNOSIS — I350 Nonrheumatic aortic (valve) stenosis: Secondary | ICD-10-CM

## 2022-05-23 DIAGNOSIS — Z6841 Body Mass Index (BMI) 40.0 and over, adult: Secondary | ICD-10-CM | POA: Insufficient documentation

## 2022-05-23 DIAGNOSIS — G4733 Obstructive sleep apnea (adult) (pediatric): Secondary | ICD-10-CM | POA: Diagnosis not present

## 2022-05-23 DIAGNOSIS — I43 Cardiomyopathy in diseases classified elsewhere: Secondary | ICD-10-CM

## 2022-05-23 LAB — CBC
HCT: 34.9 % — ABNORMAL LOW (ref 36.0–46.0)
Hemoglobin: 11.2 g/dL — ABNORMAL LOW (ref 12.0–15.0)
MCH: 28.3 pg (ref 26.0–34.0)
MCHC: 32.1 g/dL (ref 30.0–36.0)
MCV: 88.1 fL (ref 80.0–100.0)
Platelets: 229 10*3/uL (ref 150–400)
RBC: 3.96 MIL/uL (ref 3.87–5.11)
RDW: 15 % (ref 11.5–15.5)
WBC: 5.2 10*3/uL (ref 4.0–10.5)
nRBC: 0 % (ref 0.0–0.2)

## 2022-05-23 LAB — BASIC METABOLIC PANEL
Anion gap: 12 (ref 5–15)
BUN: 22 mg/dL (ref 8–23)
CO2: 30 mmol/L (ref 22–32)
Calcium: 9.3 mg/dL (ref 8.9–10.3)
Chloride: 97 mmol/L — ABNORMAL LOW (ref 98–111)
Creatinine, Ser: 1.29 mg/dL — ABNORMAL HIGH (ref 0.44–1.00)
GFR, Estimated: 46 mL/min — ABNORMAL LOW (ref >60)
Glucose, Bld: 85 mg/dL (ref 70–99)
Potassium: 3.7 mmol/L (ref 3.5–5.1)
Sodium: 139 mmol/L (ref 135–145)

## 2022-05-23 LAB — BRAIN NATRIURETIC PEPTIDE: B Natriuretic Peptide: 153.6 pg/mL — ABNORMAL HIGH (ref 0.0–100.0)

## 2022-05-23 NOTE — Progress Notes (Signed)
Date:  05/23/2022   ID:  Kimberly [name], DOB [birth year], MRN [id number]   Provider location: [place] [facility] Type of Visit: Established patient   PCP:  Windell Hummingbird, PA-C  HF Cardiologist:  Dr. Aundra Dubin   History of Present Illness: Kimberly [name] is a 67 y.o. female who has a history of paroxysmal atrial fibrillation, obesity, and nonischemic cardiomyopathy.  She was admitted in 3/15 at [facility] with atrial fibrillation and CHF.  TEE was done in preparation for DCCV, showing EF 25-30% but there was LA thrombus so DCCV was not done.  Plan was for anticoagulation x 3 months followed by TEE-guided DCCV in the OR (due to patient's body habitus).  LHC was also done, showing mild nonobstructive CAD.  Patient was diuresed with IV Lasix in the hospital and started on apixaban.    Patient had TEE again in 6/15, showing EF 35-40% with diffuse hypokinesis and no LAA thrombus.  She was cardioverted to NSR (with difficulty).  I started her at that time on amiodarone.  She then went back into atrial fibrillation.  After she had been loaded on amiodarone, cardioverted her again in 7/15 to NSR.     She had gone back into atrial fibrillation in 1/18 and amiodarone was increased to bid in preparation for DCCV. However, she spontaneously converted to NSR.   PYP scan in 4/19 was grade 3, strongly suggestive of TTR amyloidosis.  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  Echo 8/19 showed EF 60%, moderate LVH, mild aortic stenosis.  Of note, she has carpal tunnel syndrome.  She was started on tafamidis.    She went back into atrial fibrillation and had DCCV in 2/20 but was back in atrial fibrillation a few weeks later when she went to atrial fibrillation clinic.  Dr. Rayann Heman thought that morbid obesity precluded atrial fibrillation ablation and concerns about compliance led them not to start her on Tikosyn.  Instead, it was recommended that she start on CPAP for her  OSA, lose weight, and re-attempt DCCV while on amiodarone.   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  In 7/21, she developed hematochezia and had EGD/colonoscopy which were unrevealing except for internal hemorrhoids.   Echo in 10/22 showed EF 60-65%, normal RV, moderate AS mean gradient 20 mmHg with AVA 1.56 cm^2, mild AI.   Echo 04/08/22  EF 55-60%, mild LVH, mildly decreased RV systolic function with normal RV size, PASP 47 mmHg, paradoxical low flow/low gradient severe AS (mean 25 mmHg, AVA 0.6 cm^2).   Admitted after TEE/LHC/RHC for aortic stenosis workup. TEE showed an abnormal trileaflet aortic valve with doming of the right coronary cusp. There was severe aortic stenosis with moderate aortic insufficiency. LHC showed nonobstructive CAD. RHC showed elevated right and left heart filling pressures with pulmonary venous hypertension. Admitted A/C HFpEF. Diuresed well with IV lasix, weight down 16 lbs since admission. Home diuretics regimen adjusted with torsemide increased to 80 mg daily. She was referred to structural heart team for possible TAVR.   Today she returns for HF follow up. Overall feeling fine. She is not short of breath walking on flat ground with her cane. She is understandably concerned about her valve. She will have to have 6-11 teeth pulled before valve replaced. She has noticed BRBpR, she takes oral iron tablets. Denies palpitations, CP, dizziness, edema, or PND/Orthopnea. Appetite ok.  No fever or chills. Weight at home 341 pounds. Taking all medications. She is not using CPAP. She saw Dr. Tenny Craw yesterday and is under the impression she will be a TAVR candidate.  Labs (4/19): LFTs normal, K 4, creatinine 1.24, TSH normal, immunofixation normal Labs (8/19): TSH normal, K 3.8, creatinine 1.27, LFTs normal, TSH normal Labs (11/19): K 3.8, creatinine 1.21 Labs (2/20): LDL 114, hgb 10.5 Labs  (3/20): K 4.4, creatinine 1.42 Labs (4/20): K 4.2, creatinine 1.23 Labs (7/21): K 3.8, creatinine 1.23 Labs (8/21): hgb 11.2 Labs (10/21): creatinine 2.1 Labs (5/22): K 3.5, creatinine 1.46 Labs (8/22): K 3.4, creatinine 1.26 Labs (1/23): BNP 127, K 4, creatinine 1.4, LDL 112 Labs (4/23): Scr 1.3, K 3.7, LDL 96  Labs (7/23): BNP 110 Labs (11/23): K 3.7, creatinine 1.3    PMH: 1. Obese 2. Atrial fibrillation: Paroxysmal => chronic.  Unable to cardiovert in 3/15 due to LAA thrombus noted on TEE. TEE in 6/15 without LAA thrombus.  Patient had cardioversion to NSR with difficulty but back in atrial fibrillation by 7/15 appt.  She was started on amiodarone and cardioverted in 7/15.   - DCCV in 2/20 but back in atrial fibrillation by 3/20, has been in atrial fibrillation since that time.  3. H/o TKR 4. Nonischemic cardiomyopathy: TEE (3/15) with EF 25-30%, moderate MR, PA systolic pressure 51 mmHg, moderate to severe TR, LA thrombus was noted.   LHC (3/15) with 50% stenosis small PLV.  TEE (6/15) with EF 35-40%, diffuse hypokinesis, mild MR, moderate TR, +PFO, no LA appendage thrombus.  - Echo (10/15) with EF 60-65%.   - ECHO 06/10/2016 EF 60-65% Mild AS - Echo (8/19): EF 60%, moderate LVH, mild aortic stenosis.  - PYP scan in 4/19 was grade 3, strongly suggestive of transthyretin amyloidosis.  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  - Echo (11/21): EF 55-60%, mild LV dilation, RV normal, mild-moderate AS with mean gradient 17 mmHg and AVA 1.07 cm^2.  - Echo (10/22): EF 60-65%, normal RV, moderate AS mean gradient 20 mmHg with AVA 1.56 cm^2, mild AI.  - Echo (11/23): EF 55-60%, mild LVH, mildly decreased RV systolic function with normal RV size, PASP 47 mmHg, paradoxical low flow/low gradient severe AS (mean 25 mmHg, AVA 0.6 cm^2). TEE, 04/14/22: EF 55-60%, RV okay, moderate LAE, moderate AI, severe AS with mean gradient of 40 mmHg, AVA by VTI 0.86 cm2 and 0.71 cm2 by planimetry 5. CAD:  Nonobstructive.  LHC (3/15) with 50% stenosis small PLV. R/LHC, 04/14/22: -Mild CAD, elevated right and left filling pressures, pulmonary venous hypertension, peak to peak aortic valve gradient is 18 mmHg 6. PFO 7. Left Breast mass: Was taken for lumpectomy in 1/18 but mass had resolved.  8. Aortic stenosis: Moderate on 10/22 echo.  - Paradoxical low flow/low gradient severe AS on 11/23 echo.  9. Carpal tunnel syndrome.  10. Vestibular schwannoma s/p gamma knife surgery at [facility].  11. OSA: Not currently using CPAP.  12. Hematochezia: C-scope/EGD in 7/21 showed only internal hemorrhoids.  13. 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    Current Outpatient Medications  Medication Sig Dispense Refill   acetaminophen (TYLENOL) 650 MG CR tablet Take 1,300 mg by mouth every 8 (eight) hours as needed for pain.      benzonatate (TESSALON) 100 MG capsule Take 100 mg by mouth 3 (three) times daily as needed.     Cholecalciferol (VITAMIN D-3) 5000 UNITS TABS Take 5,000 Units by mouth daily.  Docusate Sodium (DSS) 100 MG CAPS Take by mouth as needed.     ELIQUIS 5 MG TABS tablet TAKE 1 TABLET BY MOUTH TWICE A DAY 60 tablet 5   ezetimibe (ZETIA) 10 MG tablet Take 0.5 tablets (5 mg total) by mouth daily. 15 tablet 11   Ferrous Sulfate 27 MG TABS Take 27 mg by mouth 2 (two) times daily.      fluticasone furoate-vilanterol (BREO ELLIPTA) 100-25 MCG/INH AEPB Inhale 1 puff into the lungs every morning. As needed     JARDIANCE 10 MG TABS tablet TAKE 1 TABLET BY MOUTH DAILY BEFORE BREAKFAST. (Patient taking differently: Take 10 mg by mouth daily.) 90 tablet 3   ketoconazole (NIZORAL) 2 % cream Apply 1 application topically as needed.     metoprolol succinate (TOPROL-XL) 100 MG 24 hr tablet Take 1 tablet (100 mg total) by mouth 2 (two) times daily. 180 tablet 3   nitroGLYCERIN (NITROSTAT) 0.4 MG SL tablet Place 1 tablet (0.4 mg total) under the tongue every 5 (five) minutes as needed for chest  pain. 30 tablet 1   Omega-3 1000 MG CAPS Take 1,000 mg by mouth every morning.     potassium chloride (KLOR-CON) 10 MEQ tablet Take 5 tablets (50 mEq total) by mouth daily. 120 tablet 3   RESTASIS 0.05 % ophthalmic emulsion Place 1 drop into both eyes daily as needed.     rosuvastatin (CRESTOR) 40 MG tablet Take 1 tablet (40 mg total) by mouth at bedtime. 90 tablet 3   Semaglutide (RYBELSUS) 3 MG TABS Take 1 tablet by mouth daily. 30 tablet 5   Tafamidis Meglumine, Cardiac, (VYNDAQEL) 20 MG CAPS Take 4 capsules (80 mg)  by mouth daily. (Patient taking differently: Take 40 mg by mouth in the morning and at bedtime.) 120 capsule 11   torsemide (DEMADEX) 20 MG tablet Take 4 tablets (80 mg total) by mouth daily. 180 tablet 3   No current facility-administered medications for this encounter.    Allergies:   Tramadol and Spironolactone   Social History:  The patient  reports that she has never smoked. She has never used smokeless tobacco. She reports that she does not drink alcohol and does not use drugs.   Family History:  The patient's family history includes CVA in her mother.   ROS:  Please see the history of present illness.   All other systems are personally reviewed and negative.   Vitals:   BP 124/80   Pulse 71   Wt (!) 159.2 kg (351 lb)   SpO2 97%   BMI 56.65 kg/m   Wt Readings from Last 3 Encounters:  05/23/22 (!) 159.2 kg (351 lb)  05/22/22 (!) 157.4 kg (347 lb)  04/24/22 (!) 158.8 kg (350 lb)   PHYSICAL EXAM: General:  NAD. No resp difficulty, walked into clinic with cane HEENT: Normal Neck: Supple. No JVD. Carotids 2+ bilat; no bruits. No lymphadenopathy or thryomegaly appreciated. Cor: PMI nondisplaced. Irregular rate & rhythm. No rubs, gallops, 2/6 SEM RUSB  Lungs: Clear Abdomen: Soft, nontender, nondistended. No hepatosplenomegaly. No bruits or masses. Good bowel sounds. Extremities: No cyanosis, clubbing, rash, edema Neuro: Alert & oriented x 3, cranial nerves  grossly intact. Moves all 4 extremities w/o difficulty. Affect pleasant.  Recent Labs: 04/14/2022: ALT 14; Hemoglobin 11.9; Platelets 227; TSH 0.932 04/16/2022: Magnesium 2.4 04/24/2022: B Natriuretic Peptide 121.4; BUN 23; Creatinine, Ser 1.28; Potassium 4.4; Sodium 139  Personally reviewed   ASSESSMENT AND PLAN: 1. 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  EF 35-40% on TEE in 6/15 but then EF back to 60-65% by 10/15 and remained normal on echo in 8/19 and in 10/22.  LHC without significant coronary disease in 3/15.  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  Improved NYHA II-early III. Volume status stable.  - Continue torsemide 80 mg daily + KCl 50 mEq daily. BMET and BNP today. - Continue Toprol XL 100 mg bid. - Continue empagliflozin 10 mg daily.  2. 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  Atrial fibrillation and carpal tunnel syndrome, both of which she has, go along with amyloidosis.  Her history of orthostatic symptoms as well as symptoms in toes and feet consistent with peripheral neuropathy are also likely related to amyloidosis.  - Continue tafamidis.   - Pt declined vutrisiran.  - She was seen by  Dr. 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 Given autosomal dominant condition, Kimberly [name] is at a 50% risk of inheriting the pathogenic variant in TTR (V142I). Genetic testing for the familial pathogenic variant was strongly recommended but pt's [name] declined.  3. Atrial fibrillation: Last DCCV in 2/20 but back in atrial fibrillation since then.  Dr. Rayann Heman saw, decided against atrial fibrillation ablation due to morbid obesity.  EP  decided against Tikosyn due to compliance concerns. She failed amiodarone.  She remains in atrial fibrillation, I think that the atrial fibrillation is likely permanent at this point.  Rate controlled w/ ? blocker. - Continue Toprol XL 100 mg bid.  - Continue Eliquis.  With recent BRBpR, check CBC 4. CAD: Mild, nonobstructive by cath 07/2013. No chest pain.  - No ASA given Eliquis use.   - Continue Crestor 40 mg daily and Zetia.  5. Bilateral carpal tunnel symptoms: Likely related to amyloidosis.  6. OSA: She says that she cannot tolerate CPAP due to tinnitus.  7.  HTN: BP controlled.  8.  Aortic stenosis: TEE 04/14/22 showed severe AS with mean gradient 40 mmHg, AVA 0.86 by VTI and 0.71 by planimetry with moderate aortic regurgitation. The valve appeared trileaflet but the right cusp appeared to dome.  Interestingly, peak-to-peak aortic gradient was only 18 mmHg.   - Do not think she would be a good SAVR candidate with obesity and comorbidities. She is being followed by the structural heart team and CVTS physicians and will soon decide on next steps, TAVR vs SAVR. 9. Obesity: Body mass index is 56.65 kg/m. She would benefit from semaglutide but does not want injection.  She is now on Rybelsus.  Follow up in 8 weeks with Dr. Aundra Dubin. I discussed this appointment may need to change based on Structural Heart Team's recommendations for proceeding with her valve replacement.  Signed, Rafael Bihari, FNP  05/23/2022  [facility] [address] [facility] [place] [address] [phone] (office) [phone] (fax)

## 2022-05-23 NOTE — Patient Instructions (Addendum)
Thank you for coming in today  Labs were done today, if any labs are abnormal the clinic will call you No news is good news  Your physician recommends that you schedule a follow-up appointment in:  8 weeks with Dr. Aundra Dubin   Do the following things EVERYDAY: Weigh yourself in the morning before breakfast. Write it down and keep it in a log. Take your medicines as prescribed Eat low salt foods--Limit salt (sodium) to 2000 mg per day.  Stay as active as you can everyday Limit all fluids for the day to less than 2 liters  At the [facility], you and your health needs are our priority. As part of our continuing mission to provide you with exceptional heart care, we have created designated Provider Care Teams. These Care Teams include your primary Cardiologist (physician) and Advanced Practice Providers (APPs- Physician Assistants and Nurse Practitioners) who all work together to provide you with the care you need, when you need it.   You may see any of the following providers on your designated Care Team at your next follow up: Dr Glori Bickers Dr [name] Dr. Roxana Hires, NP Lyda Jester, [place] [facility] [place], [place] Forestine Na, NP Audry Riles, PharmD   Please be sure to bring in all your medications bottles to every appointment.   If you have any questions or concerns before your next appointment please send [place] a message through [place] or call our office at [phone].    TO LEAVE A MESSAGE FOR THE NURSE SELECT OPTION 2, PLEASE LEAVE A MESSAGE INCLUDING: YOUR NAME DATE OF BIRTH CALL BACK NUMBER REASON FOR CALL**this is important as we prioritize the call backs  YOU WILL RECEIVE A CALL BACK THE SAME DAY AS LONG AS YOU CALL BEFORE 4:00 PM

## 2022-05-28 NOTE — Progress Notes (Signed)
[address]       [place], [address]             [phone]                    [facility] Record #[id number] Date of Birth: February 22, 1956  Referring: [name], MD Primary Care: Windell Hummingbird, PA-C Primary Cardiologist: None  Chief Complaint:    Chief Complaint  Patient presents with   Aortic Stenosis    Surgical consult for TAVR    History of Present Illness:    Kimberly [name] 67 y.o. female referred in consultation for transcatheter aortic valve implantation.   Her HPI per cardiology involves "67 y.o. female with the indicated medical history here for recommendations regarding her severe aortic valvular disease.  The patient recently underwent an elective TEE to evaluate her aortic valve stenosis.  She was found to be volume overloaded at time and admitted for diuresis.  She lost 16 pounds with IV Lasix and was discharged home with a increased dose of torsemide.   The patient endorses NYHA class II symptoms of shortness of breath.  She feels however better than she did prior to the hospital.  She endorses stable two-pillow orthopnea.  She denies any peripheral edema.  She denies any exertional angina.  She denies any presyncope or syncope.  In terms of her dental health she has not seen a dentist in some time and fears that she might have teeth that need to be extracted.  She has problems with knee pain and so she is not very active.  She tells me when she walks upstairs she will get short of breath.  She has not had any shortness of breath at rest since discharge.  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   left    Past Surgical History:  Procedure Laterality Date   CARDIOVERSION N/A 07/29/2013   Procedure: CARDIOVERSION;  Surgeon: [name], MD;  Location: [facility];  Service: Cardiovascular;  Laterality: N/A;   CARDIOVERSION N/A 11/11/2013   Procedure: CARDIOVERSION;  Surgeon: [name], MD;  Location: [facility];  Service: Cardiovascular;  Laterality: N/A;   CARDIOVERSION N/A 12/08/2013   Procedure: CARDIOVERSION;  Surgeon: [name], MD;  Location: [facility];  Service: Cardiovascular;  Laterality: N/A;   CARDIOVERSION N/A 06/30/2018   Procedure: CARDIOVERSION;  Surgeon: [name], MD;  Location: [place];  Service: Cardiovascular;  Laterality: N/A;   JOINT REPLACEMENT     LEFT HEART CATHETERIZATION WITH CORONARY ANGIOGRAM N/A 08/01/2013   Procedure: LEFT HEART CATHETERIZATION WITH CORONARY ANGIOGRAM;  Surgeon: Blane Ohara, MD;  Location: [facility] C[facility] LAB;  Service: Cardiovascular;  Laterality: N/A;   RIGHT/LEFT HEART CATH AND CORONARY ANGIOGRAPHY N/A 04/14/2022   Procedure: RIGHT/LEFT HEART CATH AND CORONARY ANGIOGRAPHY;  Surgeon: [name], MD;  Location: [place] [facility] CV LAB;  Service: Cardiovascular;  Laterality: N/A;   TEE WITHOUT CARDIOVERSION N/A 07/29/2013   Procedure: TRANSESOPHAGEAL ECHOCARDIOGRAM (TEE);  Surgeon: [name], MD;  Location: [place];  Service: Cardiovascular;  Laterality: N/A;   TEE WITHOUT CARDIOVERSION N/A 11/11/2013   Procedure: TRANSESOPHAGEAL ECHOCARDIOGRAM (TEE);  Surgeon: [name], MD;  Location: [place];  Service: Cardiovascular;  Laterality: N/A;   TEE WITHOUT CARDIOVERSION N/A 04/14/2022   Procedure: TRANSESOPHAGEAL ECHOCARDIOGRAM (TEE);  Surgeon: [name], MD;  Location: [facility] ENDOSCOPY;  Service: Cardiovascular;   Laterality: N/A;    Family History  Problem Relation Age of Onset   CVA Mother      Social History   Tobacco Use  Smoking Status Never  Smokeless Tobacco Never    Social History   Substance and Sexual Activity  Alcohol Use No   Alcohol/week: 0.0 standard drinks of alcohol     Allergies  Allergen Reactions   Tramadol Nausea And Vomiting   Spironolactone Itching    Current Outpatient Medications  Medication Sig Dispense Refill   acetaminophen (TYLENOL) 650 MG CR tablet Take 1,300 mg by mouth every 8 (eight) hours as needed for pain.      benzonatate (TESSALON) 100 MG capsule Take 100 mg by mouth 3 (three) times daily as needed.     Cholecalciferol (VITAMIN D-3) 5000 UNITS TABS Take 5,000 Units by mouth daily.     Docusate Sodium (DSS) 100 MG CAPS Take by mouth as needed.     ELIQUIS 5 MG TABS tablet TAKE 1 TABLET BY MOUTH TWICE A DAY 60 tablet 5   ezetimibe (ZETIA) 10 MG tablet Take 0.5 tablets (5 mg total) by mouth daily. 15 tablet 11   Ferrous Sulfate 27 MG TABS Take 27 mg by mouth 2 (two) times daily.      fluticasone furoate-vilanterol (BREO ELLIPTA) 100-25 MCG/INH AEPB Inhale 1 puff into the lungs every morning. As needed     JARDIANCE 10 MG TABS tablet TAKE 1 TABLET BY MOUTH DAILY BEFORE BREAKFAST. (Patient taking differently: Take 10 mg by mouth daily.) 90 tablet 3   ketoconazole (NIZORAL) 2 % cream Apply 1 application topically as needed.     metoprolol succinate (TOPROL-XL) 100 MG 24 hr tablet Take 1 tablet (100 mg total) by mouth 2 (two) times daily. 180 tablet 3   nitroGLYCERIN (NITROSTAT) 0.4 MG SL tablet Place 1 tablet (0.4 mg total) under the tongue every 5 (five) minutes as needed for chest pain. 30 tablet 1   Omega-3 1000 MG CAPS Take 1,000 mg by mouth every morning.     potassium chloride (KLOR-CON) 10 MEQ tablet Take 5 tablets (50 mEq total) by mouth daily. 120 tablet 3   RESTASIS 0.05 % ophthalmic emulsion Place 1 drop into both eyes daily as needed.      rosuvastatin (CRESTOR) 40 MG tablet Take 1 tablet (40 mg total) by mouth at bedtime. 90 tablet 3   Semaglutide (RYBELSUS) 3 MG TABS Take 1 tablet by mouth daily. 30 tablet 5   Tafamidis Meglumine, Cardiac, (VYNDAQEL) 20 MG CAPS Take 4 capsules (80 mg)  by mouth daily. (Patient taking differently: Take 40 mg by mouth in the morning and at bedtime.) 120 capsule 11   torsemide (DEMADEX) 20 MG tablet Take 4 tablets (80 mg total) by mouth daily. 180 tablet 3   No current facility-administered medications for this visit.    ROS 14 point ROS reviewed and negative except as per HPI   PHYSICAL EXAMINATION: BP 116/66  Pulse 77   Resp 20   Ht '5\' 6"'$  (1.676 m)   Wt (!) 347 lb (157.4 kg)   SpO2 96% Comment: RA  BMI 56.01 kg/m   Gen: NAD Neuro: Alert and oriented CV: + systolic murmur Resp: Nonlaboured Abd: Soft, ntnd Extr: WWP  Diagnostic Studies & Laboratory data:     Recent Radiology Findings:   CT ANGIO ABDOMEN PELVIS  W &/OR WO CONTRAST  Result Date: 05/07/2022 CLINICAL DATA:  Preop evaluation for aortic valve replacement EXAM: CT ANGIOGRAPHY CHEST, ABDOMEN AND PELVIS TECHNIQUE: Multidetector CT imaging through the chest, abdomen and pelvis was performed using the standard protocol during bolus administration of intravenous contrast. Multiplanar reconstructed images and MIPs were obtained and reviewed to evaluate the vascular anatomy. RADIATION DOSE REDUCTION: This exam was performed according to the departmental dose-optimization program which includes automated exposure control, adjustment of the mA and/or kV according to patient size and/or use of iterative reconstruction technique. CONTRAST:  118m OMNIPAQUE IOHEXOL 350 MG/ML SOLN COMPARISON:  None Available. FINDINGS: CTA CHEST FINDINGS Cardiovascular: Normal heart size. No pericardial effusion. Aortic valve thickening and calcifications. Normal caliber thoracic aorta with mild atherosclerotic disease. Three-vessel coronary artery  calcifications. Mediastinum/Nodes: Esophagus and thyroid are unremarkable. No pathologically enlarged lymph nodes seen in the chest. Lungs/Pleura: Central airways are patent. No consolidation, pleural effusion or pneumothorax. Musculoskeletal: No chest wall abnormality. No acute or significant osseous findings. CTA ABDOMEN AND PELVIS FINDINGS Hepatobiliary: No focal liver abnormality is seen. No gallstones, gallbladder wall thickening, or biliary dilatation. Pancreas: Unremarkable. No pancreatic ductal dilatation or surrounding inflammatory changes. Spleen: Normal in size without focal abnormality. Adrenals/Urinary Tract: Bilateral adrenal glands are unremarkable. No hydronephrosis or nephrolithiasis. Bilateral low-attenuation renal lesions, largest are compatible with simple cysts, others are too small to completely characterize, no specific follow-up imaging is recommended. Bladder is unremarkable. Stomach/Bowel: Stomach is within normal limits. Appendix appears normal. No evidence of bowel wall thickening, distention, or inflammatory changes. Vascular/lymphatic: No pathologically enlarged lymph nodes seen in the abdomen or pelvis. Reproductive: Uterus and bilateral adnexa are unremarkable. Other: No abdominal wall hernia or abnormality. No abdominopelvic ascites. Musculoskeletal: No acute or significant osseous findings. VASCULAR MEASUREMENTS PERTINENT TO TAVR: AORTA: Minimal Aortic Diameter-15.2 mm Severity of Aortic Calcification-mild RIGHT PELVIS: Right Common Iliac Artery - Minimal Diameter-8.7 mm Tortuosity-mild Calcification-mild Right External Iliac Artery - Minimal Diameter-7.3 mm Tortuosity-mild Calcification-none Right Common Femoral Artery - Minimal Diameter-7.2 mm Tortuosity-none Calcification-none LEFT PELVIS: Left Common Iliac Artery - Minimal Diameter-9.2 mm Tortuosity-mild Calcification-mild Left External Iliac Artery - Minimal Diameter-6.9 mm Tortuosity-moderate Calcification-none Left Common  Femoral Artery - Minimal Diameter-7.7 mm Tortuosity-none Calcification-none Review of the MIP images confirms the above findings. IMPRESSION: 1. Vascular findings and measurements pertinent to potential TAVR procedure, as detailed above. 2. Thickening and calcification of the aortic valve, compatible with reported clinical history of aortic stenosis. 3. Mild aortoiliac atherosclerosis. Three vessel coronary artery disease. Electronically Signed   By: LYetta GlassmanM.D.   On: 05/07/2022 13:00   CT ANGIO CHEST AORTA W/CM & OR WO/CM  Result Date: 05/07/2022 CLINICAL DATA:  Preop evaluation for aortic valve replacement EXAM: CT ANGIOGRAPHY CHEST, ABDOMEN AND PELVIS TECHNIQUE: Multidetector CT imaging through the chest, abdomen and pelvis was performed using the standard protocol during bolus administration of intravenous contrast. Multiplanar reconstructed images and MIPs were obtained and reviewed to evaluate the vascular anatomy. RADIATION DOSE REDUCTION: This exam was performed according to the departmental dose-optimization program which includes automated exposure control, adjustment of the mA  and/or kV according to patient size and/or use of iterative reconstruction technique. CONTRAST:  174m OMNIPAQUE IOHEXOL 350 MG/ML SOLN COMPARISON:  None Available. FINDINGS: CTA CHEST FINDINGS Cardiovascular: Normal heart size. No pericardial effusion. Aortic valve thickening and calcifications. Normal caliber thoracic aorta with mild atherosclerotic disease. Three-vessel coronary artery calcifications. Mediastinum/Nodes: Esophagus and thyroid are unremarkable. No pathologically enlarged lymph nodes seen in the chest. Lungs/Pleura: Central airways are patent. No consolidation, pleural effusion or pneumothorax. Musculoskeletal: No chest wall abnormality. No acute or significant osseous findings. CTA ABDOMEN AND PELVIS FINDINGS Hepatobiliary: No focal liver abnormality is seen. No gallstones, gallbladder wall  thickening, or biliary dilatation. Pancreas: Unremarkable. No pancreatic ductal dilatation or surrounding inflammatory changes. Spleen: Normal in size without focal abnormality. Adrenals/Urinary Tract: Bilateral adrenal glands are unremarkable. No hydronephrosis or nephrolithiasis. Bilateral low-attenuation renal lesions, largest are compatible with simple cysts, others are too small to completely characterize, no specific follow-up imaging is recommended. Bladder is unremarkable. Stomach/Bowel: Stomach is within normal limits. Appendix appears normal. No evidence of bowel wall thickening, distention, or inflammatory changes. Vascular/lymphatic: No pathologically enlarged lymph nodes seen in the abdomen or pelvis. Reproductive: Uterus and bilateral adnexa are unremarkable. Other: No abdominal wall hernia or abnormality. No abdominopelvic ascites. Musculoskeletal: No acute or significant osseous findings. VASCULAR MEASUREMENTS PERTINENT TO TAVR: AORTA: Minimal Aortic Diameter-15.2 mm Severity of Aortic Calcification-mild RIGHT PELVIS: Right Common Iliac Artery - Minimal Diameter-8.7 mm Tortuosity-mild Calcification-mild Right External Iliac Artery - Minimal Diameter-7.3 mm Tortuosity-mild Calcification-none Right Common Femoral Artery - Minimal Diameter-7.2 mm Tortuosity-none Calcification-none LEFT PELVIS: Left Common Iliac Artery - Minimal Diameter-9.2 mm Tortuosity-mild Calcification-mild Left External Iliac Artery - Minimal Diameter-6.9 mm Tortuosity-moderate Calcification-none Left Common Femoral Artery - Minimal Diameter-7.7 mm Tortuosity-none Calcification-none Review of the MIP images confirms the above findings. IMPRESSION: 1. Vascular findings and measurements pertinent to potential TAVR procedure, as detailed above. 2. Thickening and calcification of the aortic valve, compatible with reported clinical history of aortic stenosis. 3. Mild aortoiliac atherosclerosis. Three vessel coronary artery disease.  Electronically Signed   By: LYetta GlassmanM.D.   On: 05/07/2022 13:00   CT CORONARY MORPH W/CTA COR W/SCORE W/CA W/CM &/OR WO/CM  Addendum Date: 05/07/2022   ADDENDUM REPORT: 05/07/2022 12:33 EXAM: OVER-READ INTERPRETATION  CT CHEST The following report is an over-read performed by radiologist Dr. LMaudry [facility], [facility] on 05/07/2022. This over-read does not include interpretation of cardiac or coronary anatomy or pathology. The cardiac TAVR interpretation by the cardiologist is attached. COMPARISON:  None. FINDINGS: Extracardiac findings will be described separately under dictation for contemporaneously obtained CTA chest, abdomen and pelvis. IMPRESSION: Please see separate dictation for contemporaneously obtained CTA chest, abdomen and pelvis dated May 06, 2022 for full description of relevant extracardiac findings. Electronically Signed   By: LYetta GlassmanM.D.   On: 05/07/2022 12:33   Result Date: 05/07/2022 CLINICAL DATA:  Aortic Stenosis EXAM: Cardiac TAVR CT TECHNIQUE: The patient was scanned on a Siemens Force 1496slice scanner. A 120 kV retrospective scan was triggered in the ascending thoracic aorta at 140 HU's. Gantry rotation speed was 250 msecs and collimation was .6 mm. No beta blockade or nitro were given. The 3D data set was reconstructed in 5% intervals of the R-R cycle. Systolic and diastolic phases were analyzed on a dedicated work station using MPR, MIP and VRT modes. The patient received 80 cc of contrast. FINDINGS: Aortic Valve: Tri leaflet calcified with restricted leaflet motion Aorta: No aneurysm Bovine arch Mild calcific atherosclerosis Sino-tubular  Junction: 24 mm Ascending Thoracic Aorta: 32 mm Aortic Arch: 26 mm Descending Thoracic Aorta: 25 mm Sinus of Valsalva Measurements: Non-coronary: 25.1 mm Right - coronary: 25.2 mm Left -   coronary: 25.5 mm Coronary Artery Height above Annulus: Left Main: 13.1 mm above annulus Right Coronary: 17 mm above  annulus Virtual Basal Annulus Measurements: Maximum / Minimum Diameter: 22.5 mm x 18.7 mm Perimeter: 69.5 mm Area: 360 mm2 Coronary Arteries: Sufficient height above annulus for deployment Optimum Fluoroscopic Angle for Delivery: LAO 29 Cranial 7 degrees IMPRESSION: 1.  Poor quality study with bolus hang up in SVC 2. Tri leaflet AV with annular area of 360 mm2 suitable for a 23 mm Sapien valve 3.  Coronary arteries sufficient height above annulus for deployment 4. Optimum angiographic angle for deployment LAO 29 Cranial 7 degrees 5.  Aortic valve calcium score 1355 6.  Membranous septal lenght 6.5 mm Jenkins Rouge Electronically Signed: By: Jenkins Rouge M.D. On: 05/06/2022 12:20       I have independently reviewed the above radiology studies  and reviewed the findings with the patient.   Recent Lab Findings: Lab Results  Component Value Date   WBC 5.2 05/23/2022   HGB 11.2 (L) 05/23/2022   HCT 34.9 (L) 05/23/2022   PLT 229 05/23/2022   GLUCOSE 85 05/23/2022   CHOL 154 04/08/2022   TRIG 63 04/08/2022   HDL 51 04/08/2022   LDLCALC 90 04/08/2022   ALT 14 04/14/2022   AST 21 04/14/2022   NA 139 05/23/2022   K 3.7 05/23/2022   CL 97 (L) 05/23/2022   CREATININE 1.29 (H) 05/23/2022   BUN 22 05/23/2022   CO2 30 05/23/2022   TSH 0.932 04/14/2022   INR 1.28 12/08/2013   HGBA1C 6.3 (H) 06/11/2021     Assessment / Plan:   Kimberly [name] 67 y.o. female referred in consultation for transcatheter aortic valve implantation  + Severe, symptomatic aortic stenosis.  Meets criteria for AV replacement. By echo there is a mean gradient of 40, however by cath the mean gradient is 576mHg.   STS risk is 3.2%. This is significantly underestimated.   Risks/benefits/alternatives of TAVR were discussed at length (90% standard recovery, 6-9% morbidity [any organ, typically access site vascular complication needing surgery, stroke, or pacemaker] and <1% mortality. Options are TAVR, SAVR or medical  treatment. Patient understands SAVR is typically higher risk and takes longer recovery than TAVR in elderly patients. As well, discussed that medical Tx yields around 50% mortality for severe aortic stenosis in 2 years if left untreated, and this is an option.       Valve: 23 Sapien or 26 Evolut (Area 3626m, SOV height 21, average SOV diameter 27.5) EOA by TVT data is 0.59 which is in the red for Sapien 23  Bailout: yes  Access:  Transfemoral is adequate in size - minimum 6.76m23milaterally. However, there is copious tissue due to body habitus. Consideration should be given toward open access to femoral vessels if we purse TAVR. Of note, her catheter gradient across AV is only 56m27m  NYHA:II    Misc/procedural:  Finalize access Dental extractions planned for 05/29/22 Hold eliquis    DaniNeomia Glass0/2024 2:02 PM

## 2022-06-02 ENCOUNTER — Ambulatory Visit: Payer: Medicare HMO | Attending: Internal Medicine | Admitting: Internal Medicine

## 2022-06-02 ENCOUNTER — Encounter: Payer: Self-pay | Admitting: Internal Medicine

## 2022-06-02 VITALS — BP 111/72 | HR 63 | Ht 66.0 in | Wt 350.2 lb

## 2022-06-02 DIAGNOSIS — Z6841 Body Mass Index (BMI) 40.0 and over, adult: Secondary | ICD-10-CM

## 2022-06-02 DIAGNOSIS — I152 Hypertension secondary to endocrine disorders: Secondary | ICD-10-CM

## 2022-06-02 DIAGNOSIS — I43 Cardiomyopathy in diseases classified elsewhere: Secondary | ICD-10-CM

## 2022-06-02 DIAGNOSIS — I350 Nonrheumatic aortic (valve) stenosis: Secondary | ICD-10-CM | POA: Diagnosis not present

## 2022-06-02 DIAGNOSIS — I4891 Unspecified atrial fibrillation: Secondary | ICD-10-CM

## 2022-06-02 DIAGNOSIS — E1159 Type 2 diabetes mellitus with other circulatory complications: Secondary | ICD-10-CM

## 2022-06-02 DIAGNOSIS — I5022 Chronic systolic (congestive) heart failure: Secondary | ICD-10-CM | POA: Diagnosis not present

## 2022-06-02 DIAGNOSIS — E119 Type 2 diabetes mellitus without complications: Secondary | ICD-10-CM

## 2022-06-02 DIAGNOSIS — E854 Organ-limited amyloidosis: Secondary | ICD-10-CM

## 2022-06-02 DIAGNOSIS — E1169 Type 2 diabetes mellitus with other specified complication: Secondary | ICD-10-CM

## 2022-06-02 DIAGNOSIS — E785 Hyperlipidemia, unspecified: Secondary | ICD-10-CM

## 2022-06-02 NOTE — Patient Instructions (Signed)
Medication Instructions:  No changes *If you need a refill on your cardiac medications before your next appointment, please call your pharmacy*   Lab Work: none   Testing/Procedures: Your physician has requested that you have an echocardiogram. Echocardiography is a painless test that uses sound waves to create images of your heart. It provides your doctor with information about the size and shape of your heart and how well your heart's chambers and valves are working. This procedure takes approximately one hour. There are no restrictions for this procedure. Please do NOT wear cologne, perfume, aftershave, or lotions (deodorant is allowed). Please arrive 15 minutes prior to your appointment time.   Follow-Up: At [facility], you and your health needs are our priority.  As part of our continuing mission to provide you with exceptional heart care, we have created designated Provider Care Teams.  These Care Teams include your primary Cardiologist (physician) and Advanced Practice Providers (APPs -  Physician Assistants and Nurse Practitioners) who all work together to provide you with the care you need, when you need it.  We recommend signing up for the patient portal called "MyChart".  Sign up information is provided on this After Visit Summary.  MyChart is used to connect with patients for Virtual Visits (Telemedicine).  Patients are able to view lab/test results, encounter notes, upcoming appointments, etc.  Non-urgent messages can be sent to your provider as well.   To learn more about what you can do with MyChart, go to [email or web address].    Your next appointment:   3 month(s)  Provider:   Lenna Sciara, MD    Other Instructions You have been referred to our Clinical Pharmacy Team for elevated BMI/weight management

## 2022-06-02 NOTE — Progress Notes (Signed)
Patient ID: Kimberly [name] MRN: [id number] DOB/AGE: 07-02-1955 67 y.o.  Primary Care Physician:Taylor, Estill Bamberg, [place] Primary Cardiologist: [name], MD   FOCUSED CARDIOVASCULAR PROBLEM LIST:   1.  Severe aortic stenosis and moderate aortic regurgitation with an aortic valve area of 0.71 cm2, mean gradient of 49 mmHg, and ejection fraction of 55 to 60%; EKG demonstrates atrial fibrillation without conduction abnormalities 2.  Mild obstructive coronary artery disease 3.  Paroxysmal atrial fibrillation on apixaban 4.  Nonischemic cardiomyopathy with ejection fraction of 35 to 40% in 2015 now normalized 5.  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  BMI >50 7.  Left vestibular schwannoma 8.  Type 2 diabetes not on insulin   HISTORY OF PRESENT ILLNESS: December 2023 consultation: The patient is a 67 y.o. female with the indicated medical history here for recommendations regarding her severe aortic valvular disease.  The patient recently underwent an elective TEE to evaluate her aortic valve stenosis.  She was found to be volume overloaded at time and admitted for diuresis.  She lost 16 pounds with IV Lasix and was discharged home with a increased dose of torsemide.  The patient endorses NYHA class II symptoms of shortness of breath.  She feels however better than she did prior to the hospital.  She endorses stable two-pillow orthopnea.  She denies any peripheral edema.  She denies any exertional angina.  She denies any presyncope or syncope.  In terms of her dental health she has not seen a dentist in some time and fears that she might have teeth that need to be extracted.  She has problems with knee pain and so she is not very active.  She tells me when she walks upstairs she will get short of breath.  She has not had any shortness of breath at rest since discharge.  She occasionally does develop vertigo likely related to her  schwannoma.  Today: In the interim the patient was seen by cardiothoracic surgery and thought to be best served by a nonsurgical approach to treat her aortic stenosis.  We reviewed her imaging and she has a very effaced right sinus with a dominant right coronary artery which could be jeopardized.  She seems to have femoral access by CT scan with no high-grade obstructions.  The patient is here today and is doing relatively well.  She is able to do her activities of daily living without any significant shortness of breath.  Thing that limits her is knee pain.  She is able to walk up and down the [place] at a grocery store but has to stop because of knee pain.  She denies any paroxysmal nocturnal dyspnea, orthopnea, presyncope, or syncope.  She fortunately has not required any emergency room visits or hospitalizations.  Past Medical History:  Diagnosis Date   Aortic stenosis    Arthritis    KNEES   Atrial fibrillation (HCC)    Complication of anesthesia    Diabetes (HCC)    HLD (hyperlipidemia)    Left atrial thrombus    Morbid obesity (HCC)    NICM (nonischemic cardiomyopathy) (HCC)    EF 30%   PONV (postoperative nausea and vomiting)    Unilateral vestibular schwannoma ([place]) 08/29/2019   left    Past Surgical History:  Procedure Laterality Date   CARDIOVERSION N/A 07/29/2013   Procedure: CARDIOVERSION;  Surgeon: [name], MD;  Location: [place];  Service: Cardiovascular;  Laterality: N/A;   CARDIOVERSION N/A 11/11/2013  Procedure: CARDIOVERSION;  Surgeon: [name], MD;  Location: [facility] ENDOSCOPY;  Service: Cardiovascular;  Laterality: N/A;   CARDIOVERSION N/A 12/08/2013   Procedure: CARDIOVERSION;  Surgeon: [name], MD;  Location: [facility] ENDOSCOPY;  Service: Cardiovascular;  Laterality: N/A;   CARDIOVERSION N/A 06/30/2018   Procedure: CARDIOVERSION;  Surgeon: [name], MD;  Location: [facility] ENDOSCOPY;  Service: Cardiovascular;  Laterality: N/A;   JOINT REPLACEMENT      LEFT HEART CATHETERIZATION WITH CORONARY ANGIOGRAM N/A 08/01/2013   Procedure: LEFT HEART CATHETERIZATION WITH CORONARY ANGIOGRAM;  Surgeon: Blane Ohara, MD;  Location: [facility] CATH LAB;  Service: Cardiovascular;  Laterality: N/A;   RIGHT/LEFT HEART CATH AND CORONARY ANGIOGRAPHY N/A 04/14/2022   Procedure: RIGHT/LEFT HEART CATH AND CORONARY ANGIOGRAPHY;  Surgeon: [name], MD;  Location: [place] CV LAB;  Service: Cardiovascular;  Laterality: N/A;   TEE WITHOUT CARDIOVERSION N/A 07/29/2013   Procedure: TRANSESOPHAGEAL ECHOCARDIOGRAM (TEE);  Surgeon: [name], MD;  Location: [place];  Service: Cardiovascular;  Laterality: N/A;   TEE WITHOUT CARDIOVERSION N/A 11/11/2013   Procedure: TRANSESOPHAGEAL ECHOCARDIOGRAM (TEE);  Surgeon: [name], MD;  Location: [place];  Service: Cardiovascular;  Laterality: N/A;   TEE WITHOUT CARDIOVERSION N/A 04/14/2022   Procedure: TRANSESOPHAGEAL ECHOCARDIOGRAM (TEE);  Surgeon: [name], MD;  Location: [facility] ENDOSCOPY;  Service: Cardiovascular;  Laterality: N/A;    Family History  Problem Relation Age of Onset   CVA Mother     Social History   Socioeconomic History   Marital status: Single    Spouse name: Not on file   Number of children: Not on file   Years of education: Not on file   Highest education level: Not on file  Occupational History   Not on file  Tobacco Use   Smoking status: Never   Smokeless tobacco: Never  Vaping Use   Vaping Use: Never used  Substance and Sexual Activity   Alcohol use: No    Alcohol/week: 0.0 standard drinks of alcohol   Drug use: No   Sexual activity: Not Currently  Other Topics Concern   Not on file  Social History Narrative   Right handed    One story home alone   Caffeine 2 cups a day   Social Determinants of Health   Financial Resource Strain: Not on file  Food Insecurity: No Food Insecurity (04/14/2022)   Hunger Vital Sign    Worried About Running Out of Food  in the Last Year: Never true    Ran Out of Food in the Last Year: Never true  Transportation Needs: No Transportation Needs (04/14/2022)   PRAPARE - Hydrologist (Medical): No    Lack of Transportation (Non-Medical): No  Physical Activity: Not on file  Stress: Not on file  Social Connections: Not on file  Intimate Partner Violence: Not At Risk (04/14/2022)   Humiliation, Afraid, Rape, and Kick questionnaire    Fear of Current or Ex-Partner: No    Emotionally Abused: No    Physically Abused: No    Sexually Abused: No     Prior to Admission medications   Medication Sig Start Date End Date Taking? Authorizing Provider  acetaminophen (TYLENOL) 650 MG CR tablet Take 1,300 mg by mouth every 8 (eight) hours as needed for pain.     [provider]  benzonatate (TESSALON) 100 MG capsule Take 100 mg by mouth 3 (three) times daily as needed. 04/01/21   [provider]  Cholecalciferol (VITAMIN D-3) 5000 UNITS TABS Take 5,000 Units by mouth daily.    [provider]  Docusate Sodium (DSS) 100 MG CAPS Take by mouth as needed.    [provider]  ELIQUIS 5 MG TABS tablet TAKE 1 TABLET (5 MG TOTAL) BY MOUTH 2 (TWO) TIMES DAILY. Patient taking differently: Take 5 mg by mouth 2 (two) times daily. 06/07/21   [name], MD  ezetimibe (ZETIA) 10 MG tablet Take 0.5 tablets (5 mg total) by mouth daily. Patient not taking: Reported on 04/14/2022 11/28/21 11/28/22  Consuelo Pandy, PA-C  Ferrous Sulfate 27 MG TABS Take 27 mg by mouth 2 (two) times daily.     [provider]  fluticasone furoate-vilanterol (BREO ELLIPTA) 100-25 MCG/INH AEPB Inhale 1 puff into the lungs every morning. As needed 06/01/20   [provider]  JARDIANCE 10 MG TABS tablet TAKE 1 TABLET BY MOUTH DAILY BEFORE BREAKFAST. Patient taking differently: Take 10 mg by mouth daily. 07/22/21   Milford, Maricela Bo, FNP  ketoconazole (NIZORAL) 2 % cream Apply 1  application topically as needed. 12/06/19   [provider]  metoprolol succinate (TOPROL-XL) 100 MG 24 hr tablet Take 1 tablet (100 mg total) by mouth 2 (two) times daily. 12/09/21   [name], MD  nitroGLYCERIN (NITROSTAT) 0.4 MG SL tablet Place 1 tablet (0.4 mg total) under the tongue every 5 (five) minutes as needed for chest pain. 04/21/19   [name], MD  Omega-3 1000 MG CAPS Take 1,000 mg by mouth every morning.    [provider]  potassium chloride (KLOR-CON) 10 MEQ tablet Take 5 tablets (50 mEq total) by mouth daily. 04/08/22   [name], MD  RESTASIS 0.05 % ophthalmic emulsion Place 1 drop into both eyes daily as needed. 06/13/19   [provider]  rosuvastatin (CRESTOR) 40 MG tablet Take 1 tablet (40 mg total) by mouth at bedtime. 04/02/20   [name], MD  Semaglutide (RYBELSUS) 3 MG TABS Take 1 tablet by mouth daily. 04/17/22   [name], MD  Tafamidis Meglumine, Cardiac, (VYNDAQEL) 20 MG CAPS Take 4 capsules (80 mg)  by mouth daily. Patient taking differently: Take 40 mg by mouth in the morning and at bedtime. 10/02/21   [name], MD  torsemide (DEMADEX) 20 MG tablet Take 4 tablets (80 mg total) by mouth daily. 04/16/22   Clegg, Amy D, NP    Allergies  Allergen Reactions   Tramadol Nausea And Vomiting   Spironolactone Itching    REVIEW OF SYSTEMS:  General: no fevers/chills/night sweats Eyes: no blurry vision, diplopia, or amaurosis ENT: no sore throat or hearing loss Resp: no cough, wheezing, or hemoptysis CV: no edema or palpitations GI: no abdominal pain, nausea, vomiting, diarrhea, or constipation GU: no dysuria, frequency, or hematuria Skin: no rash Neuro: no headache, numbness, tingling, or weakness of extremities Musculoskeletal: no joint pain or swelling Heme: no bleeding, DVT, or easy bruising Endo: no polydipsia or polyuria  BP 111/72   Pulse 63   Ht '5\' 6"'$  (1.676 m)   Wt (!) 350 lb 3.2 oz  (158.8 kg)   SpO2 98%   BMI 56.52 kg/m   PHYSICAL EXAM: GEN:  AO x 3 in no acute distress HEENT: normal Dentition: Normal Neck: JVP normal. +2carotid upstrokes without bruits. No thyromegaly. Lungs: equal expansion, clear bilaterally CV: Apex is discrete and nondisplaced, RRR with very distant heart sounds Abd: soft, non-tender, non-distended; no  bruit; positive bowel sounds Ext: no edema, ecchymoses, or cyanosis Vascular: 2+ femoral pulses, 2+ radial pulses       Skin: warm and dry without rash Neuro: CN II-XII grossly intact; motor and sensory grossly intact    DATA AND STUDIES:  EKG: Atrial fibrillation without conduction disease  TEE:  November 2023 1. Left ventricular ejection fraction, by estimation, is 55 to 60%. The  left ventricle has normal function. There is mild concentric left  ventricular hypertrophy. Left ventricular diastolic parameters are  indeterminate.   2. Right ventricular systolic function is normal. The right ventricular  size is normal.   3. Left atrial size was moderately dilated. No left atrial/left atrial  appendage thrombus was detected.   4. Right atrial size was mildly dilated.   5. The mitral valve is normal in structure. Mild mitral valve  regurgitation. No evidence of mitral stenosis.   6. Peak RV-RA gradient 48 mmHg. Tricuspid valve regurgitation is mild to  moderate.   7. Doming of the right coronary cusp. The aortic valve is tricuspid.  There is severe calcifcation of the aortic valve. Aortic valve  regurgitation is moderate. Severe aortic valve stenosis. Aortic valve  area, by VTI measures 0.86 cm, 0.71 cm2 by  planimetry. Aortic valve mean gradient measures 40.0 mmHg.   8. PFO noted by color doppler with left to right shunting.   CARDIAC CATH: November 2023 1. Elevated right and left heart filling pressures.  2. Pulmonary venous hypertension. 3. Peak to peak aortic valve gradient 18 mmHg 4. Nonobstructive mild CAD.   STS RISK  CALCULATOR: Pending  NHYA CLASS: 2    ASSESSMENT AND PLAN:   Aortic valve stenosis, etiology of cardiac valve disease unspecified - Plan: AMB Referral to [facility], ECHOCARDIOGRAM COMPLETE  Chronic systolic CHF (congestive heart failure) (HCC)  Cardiac amyloidosis (HCC)  Atrial fibrillation, unspecified type (HCC)  Type 2 diabetes mellitus without complication, without long-term current use of insulin ([place])  Hypertension associated with diabetes ([place])  Hyperlipidemia associated with type 2 diabetes mellitus ([place])  BMI 50.0-59.9, adult ([place])  I reviewed all of the patient's relevant imaging.  I did examine the patient.  She seems to have femoral access.  If a transcatheter approach were to be pursued she would need her pannus taped up to facilitate the procedure.  We would also use her left radial for the pigtail.  I am concerned however about the long-term durability of a transcatheter aortic valve replacement in this particular individual given the need for coronary protection.  We discussed this in our structural heart meeting it was brought up that it was unclear whether the patient would survive for 10 or 15 years.  However other than her weight and she was 370 pounds in 2015, she is without significant or serious comorbidities such as insulin-dependent diabetes or chronic kidney disease.  While a TAVR with a self-expanding valve could be done with coronary protection this juxtaposition would likely lead to less long-term durability and if she were to require reintervention in the future, a valve in valve procedure would not be feasible and she would need an open surgical procedure.  In a perfect world the patient would get an open surgical procedure given her small aortic root and jeopardy of the right coronary artery which is dominant.  Additionally if a transcatheter approach were to be pursued I think the patient would require a BASILICA procedure to modify the right coronary  leaflet prior to a self-expanding valve.  I reviewed the CTA with the vendors again.  I will review the case with our structural heart team as well.  I was able to convince the patient to meet with pharmacy again to think about Ozempic or Mounjaro.  She had previously met with them and was dead set against using the medication due to a fear of needles.  In regards to her dental evaluation we will hold off on this for now until a plan regarding her treatment has been clarified.  She is not having any high risk symptoms currently.  I will obtain an echocardiogram now and will see the patient back in 2 months.  Total time spent with patient today 60 minutes. This includes reviewing records, evaluating the patient and coordinating care.   Early Osmond, MD  06/02/2022 11:58 AM    [address], [place], [place]  [address] Phone: [phone]; Fax: [phone]

## 2022-06-03 ENCOUNTER — Other Ambulatory Visit (HOSPITAL_COMMUNITY): Payer: Self-pay

## 2022-06-04 ENCOUNTER — Ambulatory Visit: Payer: Medicare HMO | Admitting: Internal Medicine

## 2022-06-10 ENCOUNTER — Other Ambulatory Visit (HOSPITAL_COMMUNITY): Payer: Self-pay

## 2022-06-12 ENCOUNTER — Other Ambulatory Visit (HOSPITAL_COMMUNITY): Payer: Self-pay

## 2022-06-12 ENCOUNTER — Other Ambulatory Visit: Payer: Self-pay

## 2022-06-13 ENCOUNTER — Telehealth: Payer: Self-pay | Admitting: Pharmacist

## 2022-06-13 MED ORDER — RYBELSUS 7 MG PO TABS: 1.0000 | ORAL_TABLET | Freq: Every day | ORAL | 0 refills | Status: DC

## 2022-06-13 NOTE — Telephone Encounter (Signed)
Pt returned call to clinic. Reports tolerating meds well. Will increase Rybelsus to '7mg'$  daily dosing and call pt in 1 more month for final dose titration.

## 2022-06-13 NOTE — Telephone Encounter (Signed)
Called pt to follow up with Rybelsus '3mg'$  tolerability and dose titration to '7mg'$  if tolerating well. Was prescribed med 04/17/22, hadn't started as of 12/29 because she forgot what the med was for and then said pharmacy didn't fill it.  Left message for pt, will await her return call.

## 2022-06-13 NOTE — Addendum Note (Signed)
Addended by: Kendrell Lottman E on: 06/13/2022 09:40 AM   Modules accepted: Orders

## 2022-06-30 ENCOUNTER — Ambulatory Visit (HOSPITAL_COMMUNITY): Payer: Medicare HMO | Attending: Internal Medicine

## 2022-06-30 DIAGNOSIS — I350 Nonrheumatic aortic (valve) stenosis: Secondary | ICD-10-CM

## 2022-06-30 LAB — ECHOCARDIOGRAM COMPLETE
AR max vel: 1.01 cm2
AV Area VTI: 0.8 cm2
AV Area mean vel: 0.87 cm2
AV Mean grad: 33 mmHg
AV Peak grad: 56.3 mmHg
Ao pk vel: 3.75 m/s
S' Lateral: 3.3 cm

## 2022-07-08 ENCOUNTER — Other Ambulatory Visit (HOSPITAL_COMMUNITY): Payer: Self-pay

## 2022-07-11 ENCOUNTER — Telehealth: Payer: Self-pay | Admitting: Pharmacist

## 2022-07-11 MED ORDER — RYBELSUS 14 MG PO TABS: 1.0000 | ORAL_TABLET | Freq: Every day | ORAL | 3 refills | Status: DC

## 2022-07-11 NOTE — Telephone Encounter (Signed)
Called pt to follow up with Rybelsus '7mg'$  daily tolerability. Doesn't check her glucose at home. No side effects. Hasn't noticed any change in her appetite or with weight loss. Weight around 347 lbs at home. Will increase to '14mg'$  maintenance dose.  Looks like pt was referred to PharmD by Dr Ali Lowe for weight loss, had appt for this coming Monday. Appt has been canceled as pt previously referred by Dr Aundra Dubin for weight loss as well. She refuses injections so was started on Rybelsus, did discuss with her that oral GLP is less effective for weight loss than injectables. She has now been titrated to maintenance dose.

## 2022-07-14 ENCOUNTER — Ambulatory Visit: Payer: Medicare HMO

## 2022-07-14 ENCOUNTER — Other Ambulatory Visit: Payer: Self-pay | Admitting: Cardiology

## 2022-07-15 ENCOUNTER — Telehealth: Payer: Self-pay | Admitting: Internal Medicine

## 2022-07-15 NOTE — Telephone Encounter (Signed)
Called patient back and reviewed echo results.  Will review more at appointment with Dr. Ali Lowe 07/25/22.

## 2022-07-15 NOTE — Telephone Encounter (Signed)
Pt returning call for echo results

## 2022-07-16 ENCOUNTER — Other Ambulatory Visit (HOSPITAL_COMMUNITY): Payer: Self-pay | Admitting: Cardiology

## 2022-07-21 NOTE — Progress Notes (Signed)
Patient ID: Kimberly [name] MRN: [id number] DOB/AGE: [birth year] 66 y.o.  Primary Care Physician:Taylor, Estill Bamberg, [place] Primary Cardiologist: [name], MD   FOCUSED CARDIOVASCULAR PROBLEM LIST:   1.  Severe aortic stenosis and moderate aortic regurgitation with an aortic valve area of 0.71 cm2, mean gradient of 49 mmHg, and ejection fraction of 55 to 60%; EKG demonstrates atrial fibrillation without conduction abnormalities -Review of coronary CTA demonstrates a highly effaced right sinus associated with a dominant right coronary artery that would likely be in jeopardy 2.  Mild obstructive coronary artery disease 3.  Paroxysmal atrial fibrillation on apixaban 4.  Nonischemic cardiomyopathy with ejection fraction of 35 to 40% in 2015 now normalized 5.  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  BMI >50 7.  Left vestibular schwannoma 8.  Type 2 diabetes not on insulin   HISTORY OF PRESENT ILLNESS: December 2023 consultation: The patient is a 67 y.o. female with the indicated medical history here for recommendations regarding her severe aortic valvular disease.  The patient recently underwent an elective TEE to evaluate her aortic valve stenosis.  She was found to be volume overloaded at time and admitted for diuresis.  She lost 16 pounds with IV Lasix and was discharged home with a increased dose of torsemide.  The patient endorses NYHA class II symptoms of shortness of breath.  She feels however better than she did prior to the hospital.  She endorses stable two-pillow orthopnea.  She denies any peripheral edema.  She denies any exertional angina.  She denies any presyncope or syncope.  In terms of her dental health she has not seen a dentist in some time and fears that she might have teeth that need to be extracted.  She has problems with knee pain and so she is not very active.  She tells me when she walks upstairs she will get short of  breath.  She has not had any shortness of breath at rest since discharge.  She occasionally does develop vertigo likely related to her schwannoma.  January 2024: In the interim the patient was seen by cardiothoracic surgery and thought to be best served by a nonsurgical approach to treat her aortic stenosis.  We reviewed her imaging and she has a very effaced right sinus with a dominant right coronary artery which could be jeopardized.  She seems to have femoral access by CT scan with no high-grade obstructions.  The patient is here today and is doing relatively well.  She is able to do her activities of daily living without any significant shortness of breath.  Thing that limits her is knee pain.  She is able to walk up and down the [place] at a grocery store but has to stop because of knee pain.  She denies any paroxysmal nocturnal dyspnea, orthopnea, presyncope, or syncope.  She fortunately has not required any emergency room visits or hospitalizations.  Plan: Refer to pharmacy regarding Ozempic or Mounjaro, obtain echocardiogram, follow-up in 2 months.  Today: In the interim the patient had her semaglutide dose increased.  It does not look like she had a discussion with pharmacy regarding Mounjaro or [id number].  Since I saw her.  She denies any significantly increasing shortness of breath.  She does get lightheaded when she goes from laying to sitting in the morning.  She does not have any other episodes of presyncope or syncope.  She denies any chest pain and her chronic shortness of breath is relatively  unchanged.  She is otherwise without significant cardiovascular complaints today.  Past Medical History:  Diagnosis Date   Aortic stenosis    Arthritis    KNEES   Atrial fibrillation (HCC)    Complication of anesthesia    Diabetes ([place])    HLD (hyperlipidemia)    Left atrial thrombus    Morbid obesity (HCC)    NICM (nonischemic cardiomyopathy) ([place])    EF 30%   PONV (postoperative nausea and  vomiting)    Unilateral vestibular schwannoma ([place]) 08/29/2019   left    Past Surgical History:  Procedure Laterality Date   CARDIOVERSION N/A 07/29/2013   Procedure: CARDIOVERSION;  Surgeon: [name], MD;  Location: [facility];  Service: Cardiovascular;  Laterality: N/A;   CARDIOVERSION N/A 11/11/2013   Procedure: CARDIOVERSION;  Surgeon: [name], MD;  Location: [facility] ENDOSCOPY;  Service: Cardiovascular;  Laterality: N/A;   CARDIOVERSION N/A 12/08/2013   Procedure: CARDIOVERSION;  Surgeon: [name], MD;  Location: [facility] ENDOSCOPY;  Service: Cardiovascular;  Laterality: N/A;   CARDIOVERSION N/A 06/30/2018   Procedure: CARDIOVERSION;  Surgeon: [name], MD;  Location[facility] ENDOSCOPY;  Service: Cardiovascular;  Laterality: N/A;   JOINT REPLACEMENT     LEFT HEART CATHETERIZATION WITH CORONARY ANGIOGRAM N/A 08/01/2013   Procedure: LEFT HEART CATHETERIZATION WITH CORONARY ANGIOGRAM;  Surgeon: Blane Ohara, MD;  Location: [facility] CATH LAB;  Service: Cardiovascular;  Laterality: N/A;   RIGHT/LEFT HEART CATH AND CORONARY ANGIOGRAPHY N/A 04/14/2022   Procedure: RIGHT/LEFT HEART CATH AND CORONARY ANGIOGRAPHY;  Surgeon: [name], MD;  Location: [place] CV LAB;  Service: Cardiovascular;  Laterality: N/A;   TEE WITHOUT CARDIOVERSION N/A 07/29/2013   Procedure: TRANSESOPHAGEAL ECHOCARDIOGRAM (TEE);  Surgeon: [name], MD;  Location: [facility];  Service: Cardiovascular;  Laterality: N/A;   TEE WITHOUT CARDIOVERSION N/A 11/11/2013   Procedure: TRANSESOPHAGEAL ECHOCARDIOGRAM (TEE);  Surgeon: [name], MD;  Location: [facility];  Service: Cardiovascular;  Laterality: N/A;   TEE WITHOUT CARDIOVERSION N/A 04/14/2022   Procedure: TRANSESOPHAGEAL ECHOCARDIOGRAM (TEE);  Surgeon: [name], MD;  Location: [facility] ENDOSCOPY;  Service: Cardiovascular;  Laterality: N/A;    Family History  Problem Relation Age of Onset   CVA Mother     Social History    Socioeconomic History   Marital status: Single    Spouse name: Not on file   Number of children: Not on file   Years of education: Not on file   Highest education level: Not on file  Occupational History   Not on file  Tobacco Use   Smoking status: Never   Smokeless tobacco: Never  Vaping Use   Vaping Use: Never used  Substance and Sexual Activity   Alcohol use: No    Alcohol/week: 0.0 standard drinks of alcohol   Drug use: No   Sexual activity: Not Currently  Other Topics Concern   Not on file  Social History Narrative   Right handed    One story home alone   Caffeine 2 cups a day   Social Determinants of Health   Financial Resource Strain: Not on file  Food Insecurity: No Food Insecurity (04/14/2022)   Hunger Vital Sign    Worried About Running Out of Food in the Last Year: Never true    Ran Out of Food in the Last Year: Never true  Transportation Needs: No Transportation Needs (04/14/2022)   PRAPARE - Hydrologist (Medical): No  Lack of Transportation (Non-Medical): No  Physical Activity: Not on file  Stress: Not on file  Social Connections: Not on file  Intimate Partner Violence: Not At Risk (04/14/2022)   Humiliation, Afraid, Rape, and Kick questionnaire    Fear of Current or Ex-Partner: No    Emotionally Abused: No    Physically Abused: No    Sexually Abused: No     Prior to Admission medications   Medication Sig Start Date End Date Taking? Authorizing Provider  acetaminophen (TYLENOL) 650 MG CR tablet Take 1,300 mg by mouth every 8 (eight) hours as needed for pain.     [provider]  benzonatate (TESSALON) 100 MG capsule Take 100 mg by mouth 3 (three) times daily as needed. 04/01/21   [provider]  Cholecalciferol (VITAMIN D-3) 5000 UNITS TABS Take 5,000 Units by mouth daily.    [provider]  Docusate Sodium (DSS) 100 MG CAPS Take by mouth as needed.    [provider]  ELIQUIS 5  MG TABS tablet TAKE 1 TABLET (5 MG TOTAL) BY MOUTH 2 (TWO) TIMES DAILY. Patient taking differently: Take 5 mg by mouth 2 (two) times daily. 06/07/21   [name], MD  ezetimibe (ZETIA) 10 MG tablet Take 0.5 tablets (5 mg total) by mouth daily. Patient not taking: Reported on 04/14/2022 11/28/21 11/28/22  Consuelo Pandy, PA-C  Ferrous Sulfate 27 MG TABS Take 27 mg by mouth 2 (two) times daily.     [provider]  fluticasone furoate-vilanterol (BREO ELLIPTA) 100-25 MCG/INH AEPB Inhale 1 puff into the lungs every morning. As needed 06/01/20   [provider]  JARDIANCE 10 MG TABS tablet TAKE 1 TABLET BY MOUTH DAILY BEFORE BREAKFAST. Patient taking differently: Take 10 mg by mouth daily. 07/22/21   [place], Maricela Bo, FNP  ketoconazole (NIZORAL) 2 % cream Apply 1 application topically as needed. 12/06/19   [provider]  metoprolol succinate (TOPROL-XL) 100 MG 24 hr tablet Take 1 tablet (100 mg total) by mouth 2 (two) times daily. 12/09/21   [name], MD  nitroGLYCERIN (NITROSTAT) 0.4 MG SL tablet Place 1 tablet (0.4 mg total) under the tongue every 5 (five) minutes as needed for chest pain. 04/21/19   [name], MD  Omega-3 1000 MG CAPS Take 1,000 mg by mouth every morning.    [provider]  potassium chloride (KLOR-CON) 10 MEQ tablet Take 5 tablets (50 mEq total) by mouth daily. 04/08/22   [name], MD  RESTASIS 0.05 % ophthalmic emulsion Place 1 drop into both eyes daily as needed. 06/13/19   [provider]  rosuvastatin (CRESTOR) 40 MG tablet Take 1 tablet (40 mg total) by mouth at bedtime. 04/02/20   [name], MD  Semaglutide (RYBELSUS) 3 MG TABS Take 1 tablet by mouth daily. 04/17/22   [name], MD  Tafamidis Meglumine, Cardiac, (VYNDAQEL) 20 MG CAPS Take 4 capsules (80 mg)  by mouth daily. Patient taking differently: Take 40 mg by mouth in the morning and at bedtime. 10/02/21   [name], MD   torsemide (DEMADEX) 20 MG tablet Take 4 tablets (80 mg total) by mouth daily. 04/16/22   Clegg, Amy D, NP    Allergies  Allergen Reactions   Tramadol Nausea And Vomiting   Spironolactone Itching    REVIEW OF SYSTEMS:  General: no fevers/chills/night sweats Eyes: no blurry vision, diplopia, or amaurosis ENT: no sore throat or hearing loss Resp: no  cough, wheezing, or hemoptysis CV: no edema or palpitations GI: no abdominal pain, nausea, vomiting, diarrhea, or constipation GU: no dysuria, frequency, or hematuria Skin: no rash Neuro: no headache, numbness, tingling, or weakness of extremities Musculoskeletal: no joint pain or swelling Heme: no bleeding, DVT, or easy bruising Endo: no polydipsia or polyuria  BP 110/62   Pulse 80   Ht '5\' 6"'$  (1.676 m)   Wt (!) 347 lb 9.6 oz (157.7 kg)   SpO2 94%   BMI 56.10 kg/m   PHYSICAL EXAM: GEN:  AO x 3 in no acute distress HEENT: normal Dentition: Normal Neck: JVP normal. +2carotid upstrokes without bruits. No thyromegaly. Lungs: equal expansion, clear bilaterally CV: Apex is discrete and nondisplaced, RRR with very distant heart sounds Abd: soft, non-tender, non-distended; no bruit; positive bowel sounds Ext: no edema, ecchymoses, or cyanosis Vascular: 2+ femoral pulses, 2+ radial pulses       Skin: warm and dry without rash Neuro: CN II-XII grossly intact; motor and sensory grossly intact    DATA AND STUDIES:  EKG: Atrial fibrillation without conduction disease  TTE:  February 2024  1. Left ventricular ejection fraction, by estimation, is 55 to 60%. The  left ventricle has normal function. The left ventricle demonstrates  regional wall motion abnormalities with possible basal inferior  hypokinesis. There is mild concentric left  ventricular hypertrophy. Left ventricular diastolic parameters are  indeterminate.   2. Right ventricular systolic function is mildly reduced. The right  ventricular size is mildly enlarged.  Tricuspid regurgitation signal is  inadequate for assessing PA pressure.   3. Left atrial size was mildly dilated.   4. Right atrial size was mildly dilated.   5. The mitral valve is normal in structure. Mild mitral valve  regurgitation. No evidence of mitral stenosis.   6. The aortic valve is tricuspid. There is severe calcifcation of the  aortic valve. Aortic valve regurgitation is mild to moderate. Paradoxical  low flow/low gradient severe aortic valve stenosis. Aortic valve area, by  VTI measures 0.80 cm. Aortic valve   mean gradient measures 33.0 mmHg.   7. The inferior vena cava is dilated in size with >50% respiratory  variability, suggesting right atrial pressure of 8 mmHg.   8. The patient was in atrial fibrillation.   CARDIAC CATH: November 2023 1. Elevated right and left heart filling pressures.  2. Pulmonary venous hypertension. 3. Peak to peak aortic valve gradient 18 mmHg 4. Nonobstructive mild CAD.   STS RISK CALCULATOR: Pending  NHYA CLASS: 2    ASSESSMENT AND PLAN:   Aortic valve stenosis, etiology of cardiac valve disease unspecified - Plan: ECHOCARDIOGRAM COMPLETE  Chronic systolic CHF (congestive heart failure) ([place]) - Plan: ECHOCARDIOGRAM COMPLETE  Cardiac amyloidosis ([place]) - Plan: ECHOCARDIOGRAM COMPLETE  Atrial fibrillation, unspecified type ([place]) - Plan: ECHOCARDIOGRAM COMPLETE  Type 2 diabetes mellitus without complication, without long-term current use of insulin ([place]) - Plan: ECHOCARDIOGRAM COMPLETE  Hypertension associated with diabetes ([place]) - Plan: ECHOCARDIOGRAM COMPLETE  Hyperlipidemia associated with type 2 diabetes mellitus ([place]) - Plan: ECHOCARDIOGRAM COMPLETE  BMI 50.0-59.9, adult ([place]) - Plan: ECHOCARDIOGRAM COMPLETE  Coronary artery disease involving native heart without angina pectoris, unspecified vessel or lesion type - Plan: ECHOCARDIOGRAM COMPLETE  I had a long conversation with the patient about her issues.  She has moderate  to severe aortic stenosis and is not yet too symptomatic.  We reviewed her CT scan and she has a very effaced right coronary sinus.  I do not think  a transcatheter attic valve replacement will be optimal in terms of a durability standpoint and from a coronary protection standpoint.  Even if we did protect her coronary with a stent placed in a snorkel position, I think this configuration would degenerate quite quickly and she would need surgery rather than anything through a transcatheter approach to resolve this.  I think her best pathway forward would be to lose as much weight as possible in order to optimize her as a surgical candidate.  She is willing to consider an injectable medication for weight loss.  She was unwilling to do this previously.  I will have her see pharmacy to discuss this further.  I am unsure whether Rybelsus will lead to the significant weight loss that we would need to make her more optimal candidate for surgery.  Otherwise I will see her back in 6 months with an echocardiogram.  I have asked her to contact us if she develops any worrisome symptoms in the interim.   Early Osmond, MD  07/25/2022 1:50 PM    [facility] [place], [place], [place]  [address] Phone: [phone]; Fax: [phone]

## 2022-07-25 ENCOUNTER — Ambulatory Visit: Payer: Medicare HMO | Attending: Internal Medicine | Admitting: Internal Medicine

## 2022-07-25 ENCOUNTER — Telehealth: Payer: Self-pay | Admitting: Pharmacist

## 2022-07-25 ENCOUNTER — Encounter: Payer: Self-pay | Admitting: Internal Medicine

## 2022-07-25 VITALS — BP 110/62 | HR 80 | Ht 66.0 in | Wt 347.6 lb

## 2022-07-25 DIAGNOSIS — I43 Cardiomyopathy in diseases classified elsewhere: Secondary | ICD-10-CM

## 2022-07-25 DIAGNOSIS — E1169 Type 2 diabetes mellitus with other specified complication: Secondary | ICD-10-CM

## 2022-07-25 DIAGNOSIS — E785 Hyperlipidemia, unspecified: Secondary | ICD-10-CM

## 2022-07-25 DIAGNOSIS — E119 Type 2 diabetes mellitus without complications: Secondary | ICD-10-CM

## 2022-07-25 DIAGNOSIS — E854 Organ-limited amyloidosis: Secondary | ICD-10-CM

## 2022-07-25 DIAGNOSIS — I4891 Unspecified atrial fibrillation: Secondary | ICD-10-CM | POA: Diagnosis not present

## 2022-07-25 DIAGNOSIS — I5022 Chronic systolic (congestive) heart failure: Secondary | ICD-10-CM | POA: Diagnosis not present

## 2022-07-25 DIAGNOSIS — E1159 Type 2 diabetes mellitus with other circulatory complications: Secondary | ICD-10-CM

## 2022-07-25 DIAGNOSIS — I2510 Atherosclerotic heart disease of native coronary artery without angina pectoris: Secondary | ICD-10-CM

## 2022-07-25 DIAGNOSIS — I152 Hypertension secondary to endocrine disorders: Secondary | ICD-10-CM

## 2022-07-25 DIAGNOSIS — I350 Nonrheumatic aortic (valve) stenosis: Secondary | ICD-10-CM

## 2022-07-25 DIAGNOSIS — Z6841 Body Mass Index (BMI) 40.0 and over, adult: Secondary | ICD-10-CM

## 2022-07-25 MED ORDER — MOUNJARO 2.5 MG/0.5ML ~~LOC~~ SOAJ: 2.5000 mg | SUBCUTANEOUS | 0 refills | Status: DC

## 2022-07-25 NOTE — Telephone Encounter (Addendum)
Saw Dr Ali Lowe today, needs significant weight loss to have surgery for aortic stenosis. Previously referred by Dr Aundra Dubin as well as Dr Ali Lowe for GLP however pt refused injectable GLP and was started on Rybelsus after discussion that Rybelsus is much less effective for weight loss. Weight essentially unchanged at visit today despite being titrated to maintenance dose of Rybelsus '14mg'$  daily. She is now willing to try injectable GLP. Will submit PA request for [facility] and call pt once insurance coverage is known. Key: [id number]

## 2022-07-25 NOTE — Patient Instructions (Addendum)
Medication Instructions:  Your physician recommends that you continue on your current medications as directed. Please refer to the Current Medication list given to you today.  *If you need a refill on your cardiac medications before your next appointment, please call your pharmacy*  Lab Work: If you have labs (blood work) drawn today and your tests are completely normal, you will receive your results only by: [place] (if you have MyChart) OR A paper copy in the mail If you have any lab test that is abnormal or we need to change your treatment, we will call you to review the results.  Testing/Procedures: Your physician has requested that you have an echocardiogram in 6 months same day as office visit. Echocardiography is a painless test that uses sound waves to create images of your heart. It provides your doctor with information about the size and shape of your heart and how well your heart's chambers and valves are working. This procedure takes approximately one hour. There are no restrictions for this procedure. Please do NOT wear cologne, perfume, aftershave, or lotions (deodorant is allowed). Please arrive 15 minutes prior to your appointment time.  Follow-Up: At [facility], you and your health needs are our priority.  As part of our continuing mission to provide you with exceptional heart care, we have created designated Provider Care Teams.  These Care Teams include your primary Cardiologist (physician) and Advanced Practice Providers (APPs -  Physician Assistants and Nurse Practitioners) who all work together to provide you with the care you need, when you need it.  We recommend signing up for the patient portal called "MyChart".  Sign up information is provided on this After Visit Summary.  MyChart is used to connect with patients for Virtual Visits (Telemedicine).  Patients are able to view lab/test results, encounter notes, upcoming appointments, etc.  Non-urgent  messages can be sent to your provider as well.   To learn more about what you can do with MyChart, go to [email or web address].    Your next appointment:   6 month(s)  Provider:   Early Osmond, MD     Other Instructions Your physician recommends that you schedule a follow-up appointment with pharmacist as soon as possible.

## 2022-07-25 NOTE — Telephone Encounter (Addendum)
PA approved through 05/18/23. Called pt to discuss. She is still hesitant about giving herself an injection. I have sent in rx for Mounjaro starting dose to her pharmacy and advised her to store in the [place] and bring with her to PharmD appt next Tuesday. We will help her with her first injection. Will need lifestyle counseling as well. Pt will need reminding that her that Darcel Bayley will replace her Rybelsus. I have called her pharmacy and asked them to remove Rybelsus from her med list.

## 2022-07-29 ENCOUNTER — Telehealth: Payer: Self-pay | Admitting: Internal Medicine

## 2022-07-29 ENCOUNTER — Other Ambulatory Visit (HOSPITAL_COMMUNITY): Payer: Self-pay

## 2022-07-29 ENCOUNTER — Ambulatory Visit: Payer: Medicare HMO

## 2022-07-29 NOTE — Telephone Encounter (Signed)
Called pt, she states the pharmacy doesn't have Mounjaro in stock yet. She wants to r/s her PharmD appt to when she has med on hand so we can help with her first injection since she is needle-phobic. I have provided her with my direct # to call once she's able to pick up [facility]. This will be replacing her Rybelsus.

## 2022-07-29 NOTE — Telephone Encounter (Signed)
Pt is scheduled for an appt this afternoon to show the pt how to take the medication, but pt stated that [facility] doesn't have the medication in stock. Pt is wanting to know if they should r/s their appt for when they pick [facility] the [facility] from [facility].

## 2022-07-29 NOTE — Telephone Encounter (Signed)
Appt canceled today per pt request, pharmacy doesn't have Mounjaro in stock yet. I gave pt my direct # to call when she's able to pick up [facility] and will r/s PharmD appt then. She is needle phobic and prefers to give first injection in clinic.

## 2022-07-29 NOTE — Progress Notes (Deleted)
HPI: Kimberly [name] is a 67 y.o. female patient referred to pharmacy clinic by Dr. [name] to initiate weight loss therapy with GLP1-RA.  Most recent BMI ***.  Stop medication x 7 days for surgical procedures requiring anesthesia  Significant medical history:                  *** If diabetic and on insulin/sulfonylurea, can consider reducing dose to reduce risk of hypoglycemia  *** Follow-up visit  Assess % weight loss Assess adverse effects Missed doses  Current weight management medications:   Previously tried meds:   Current meds that may affect weight:   Baseline weight/BMI:   Insurance payor:   Diet:  -Breakfast: -Lunch: -Dinner: -Snacks: -Drinks:  {diet history for weight loss:28257}  Exercise:  {types:28256}  Family History:   Confirmed patient not ***pregnant and no personal or family history of medullary thyroid carcinoma (MTC) or Multiple Endocrine Neoplasia syndrome type 2 (MEN 2).   Social History:   Labs: Lab Results  Component Value Date   HGBA1C 6.3 (H) 06/11/2021    Wt Readings from Last 1 Encounters:  07/25/22 (!) 347 lb 9.6 oz (157.7 kg)    BP Readings from Last 1 Encounters:  07/25/22 110/62   Pulse Readings from Last 1 Encounters:  07/25/22 80       Component Value Date/Time   CHOL 154 04/08/2022 1221   TRIG 63 04/08/2022 1221   HDL 51 04/08/2022 1221   CHOLHDL 3.0 04/08/2022 1221   VLDL 13 04/08/2022 1221   [place] 90 04/08/2022 1221    Past Medical History:  Diagnosis Date   Aortic stenosis    Arthritis    KNEES   Atrial fibrillation (HCC)    Complication of anesthesia    Diabetes (HCC)    HLD (hyperlipidemia)    Left atrial thrombus    Morbid obesity (HCC)    NICM (nonischemic cardiomyopathy) (HCC)    EF 30%   PONV (postoperative nausea and vomiting)    Unilateral vestibular schwannoma ([place]) 08/29/2019   left    Current Outpatient Medications on File Prior to Visit  Medication Sig Dispense  Refill   acetaminophen (TYLENOL) 650 MG CR tablet Take 1,300 mg by mouth every 8 (eight) hours as needed for pain.      benzonatate (TESSALON) 100 MG capsule Take 100 mg by mouth 3 (three) times daily as needed.     Cholecalciferol (VITAMIN D-3) 5000 UNITS TABS Take 5,000 Units by mouth daily.     Docusate Sodium (DSS) 100 MG CAPS Take by mouth as needed.     ELIQUIS 5 MG TABS tablet TAKE 1 TABLET BY MOUTH TWICE A DAY 60 tablet 5   ezetimibe (ZETIA) 10 MG tablet Take 0.5 tablets (5 mg total) by mouth daily. 15 tablet 11   Ferrous Sulfate 27 MG TABS Take 27 mg by mouth 2 (two) times daily.      fluticasone furoate-vilanterol (BREO ELLIPTA) 100-25 MCG/INH AEPB Inhale 1 puff into the lungs every morning. As needed     JARDIANCE 10 MG TABS tablet TAKE 1 TABLET BY MOUTH DAILY BEFORE BREAKFAST. (Patient taking differently: Take 10 mg by mouth daily.) 90 tablet 3   ketoconazole (NIZORAL) 2 % cream Apply 1 application topically as needed.     metoprolol succinate (TOPROL-XL) 100 MG 24 hr tablet Take 1 tablet (100 mg total) by mouth 2 (two) times daily. 180 tablet 3   nitroGLYCERIN (NITROSTAT) 0.4 MG SL tablet Place 1 tablet (  0.4 mg total) under the tongue every 5 (five) minutes as needed for chest pain. 30 tablet 1   Omega-3 1000 MG CAPS Take 1,000 mg by mouth every morning.     potassium chloride (KLOR-CON) 10 MEQ tablet TAKE 5 TABLETS (50 MEQ TOTAL) BY MOUTH DAILY. 120 tablet 3   RESTASIS 0.05 % ophthalmic emulsion Place 1 drop into both eyes daily as needed.     rosuvastatin (CRESTOR) 40 MG tablet Take 1 tablet (40 mg total) by mouth at bedtime. 90 tablet 3   Tafamidis Meglumine, Cardiac, (VYNDAQEL) 20 MG CAPS Take 4 capsules (80 mg)  by mouth daily. (Patient taking differently: Take 40 mg by mouth in the morning and at bedtime.) 120 capsule 11   tirzepatide (MOUNJARO) 2.5 MG/0.5ML Pen Inject 2.5 mg into the skin once a week. 2 mL 0   torsemide (DEMADEX) 20 MG tablet Take 4 tablets (80 mg total) by  mouth daily. 180 tablet 3   No current facility-administered medications on file prior to visit.    Allergies  Allergen Reactions   Tramadol Nausea And Vomiting   Spironolactone Itching    Assessment/Plan   No problem-specific Assessment & Plan notes found for this encounter.  '@MTPCOMPLETEDLIST'$ @  {f/u with PharmD:28259}  Tommy Medal PharmD CPP [facility] [address] [place], [place] [address]

## 2022-07-30 ENCOUNTER — Telehealth: Payer: Self-pay | Admitting: Internal Medicine

## 2022-07-30 NOTE — Telephone Encounter (Signed)
Her Rybelsus is being stopped and she's being started on Mounjaro. Med unlikely to be causing her symptoms, agree she should discuss with PCP on Friday. Pt aware of recs. R/s PharmD appt with pt for tomorrow for [facility] initiation, she is going to pick up med later today.

## 2022-07-30 NOTE — Telephone Encounter (Signed)
Patient called stating her left hand is numb and burning, it only happens a night during the day she is fine.  She is wondering if there is anything that the doctor might recommend.  She states she just started medication about two weeks ago, she doesn't know the name of it she is not home right now.  She states that is when the numbness in her hand started to get really bad.

## 2022-07-30 NOTE — Telephone Encounter (Signed)
Called and spoke w patient.  She reports her left hand is tingling and burning every night.  It starts right when she goes to bed and wakes her up during the night.  She has had it before but feels it is much worse since starting Rebylsis.  She says this was started by the pharmacy at Dr. Claris Gladden office.   I adv her to speak w PCP about her symptoms.  She sees her PCP this Friday.    She received a call that the Darcel Bayley is in.  Her call today was to talk to [facility].  I told her I'd pass on the information.

## 2022-07-31 ENCOUNTER — Ambulatory Visit: Payer: Medicare HMO | Attending: Internal Medicine | Admitting: Student

## 2022-07-31 ENCOUNTER — Encounter: Payer: Self-pay | Admitting: Student

## 2022-07-31 DIAGNOSIS — E669 Obesity, unspecified: Secondary | ICD-10-CM

## 2022-07-31 DIAGNOSIS — E1169 Type 2 diabetes mellitus with other specified complication: Secondary | ICD-10-CM

## 2022-07-31 NOTE — Patient Instructions (Signed)
GLP1 Agonist Titration Plan:  Will plan to follow the titration plan as below, pending patient is tolerating each dose before increasing to the next. Can slow titration if needed for tolerability.     Mounjaro  -Month 1: Inject Mounjaro  2.5 mg once weekly x 4 weeks -Month 2: Inject Mounjaro 5 mg once weekly x 4 weeks -Month 3: Inject Mounjaro 7.5 mg once weekly x 4 weeks -Month 4: Inject Mounjaro 10 mg SQ once weekly x 4 weeks -Month 5: Inject Mounjaro 12.5 mg SQ once weekly  x 4 weeks -Month 6+: Inject Mounjaro 15 mg SQ once weekly

## 2022-07-31 NOTE — Assessment & Plan Note (Signed)
Assessment/plan  Last A1c 6.3% current Wt at home 339 lbs BMI 56  Was on Rybelsus - ineffective for weight loss  On Jardiance 10 mg for HF  Switching oral semaglutide to [facility] once week  Patient self injected 1st dose in the office after getting trained on demo pen  Will titrate dose as per patient's tolerability in next 4 weeks  Patient to start eating healthy balanced small meals  Discussed potential s/e of Mounjaro and its management, importance of dose titration to achieve weight loss

## 2022-07-31 NOTE — Progress Notes (Signed)
HPI: Kimberly [name] is a 67 y.o. female patient referred to pharmacy clinic by Dr. Ali Lowe to initiate weight loss therapy with GLP1-RA.  PMH significant for CHF, HTN, CHF atrial fibrillation, hx of ACS , T2DM,HDL.  Patient is here to get training on [facility] administration. Patient is unable to loose any weight on Rybelsus.  Reports she does not eat 3 regular meals per day and she does not have big appetite. She eats only home cooked meals.  She is unable to do regular exercise due to bad knee. However she try to walk around her house frequently. Confirmed patient has no personal or family history of medullary thyroid carcinoma (MTC) or Multiple Endocrine Neoplasia syndrome type 2 (MEN 2).     Diet:  -Breakfast: none  -Lunch: sandwiches with chips  -Dinner: meat and vegetables  -Snacks: fruit or chips or cookie  -Drinks: stopped drinking soda and sweet tea now drinks mainly plain water and sometime lemonade and one cup of coffee per day   Exercise: none, just try to stay active around the house    Social History:  Alcohol: none Smoking: never   Labs: Lab Results  Component Value Date   HGBA1C 6.3 (H) 06/11/2021    Wt Readings from Last 1 Encounters:  07/25/22 (!) 347 lb 9.6 oz (157.7 kg)    BP Readings from Last 1 Encounters:  07/25/22 110/62   Pulse Readings from Last 1 Encounters:  07/25/22 80       Component Value Date/Time   CHOL 154 04/08/2022 1221   TRIG 63 04/08/2022 1221   HDL 51 04/08/2022 1221   CHOLHDL 3.0 04/08/2022 1221   VLDL 13 04/08/2022 1221   [place] 90 04/08/2022 1221    Past Medical History:  Diagnosis Date   Aortic stenosis    Arthritis    KNEES   Atrial fibrillation (HCC)    Complication of anesthesia    Diabetes ([place])    HLD (hyperlipidemia)    Left atrial thrombus    Morbid obesity (HCC)    NICM (nonischemic cardiomyopathy) (HCC)    EF 30%   PONV (postoperative nausea and vomiting)    Unilateral vestibular schwannoma  ([place]) 08/29/2019   left    Current Outpatient Medications on File Prior to Visit  Medication Sig Dispense Refill   acetaminophen (TYLENOL) 650 MG CR tablet Take 1,300 mg by mouth every 8 (eight) hours as needed for pain.      benzonatate (TESSALON) 100 MG capsule Take 100 mg by mouth 3 (three) times daily as needed.     Cholecalciferol (VITAMIN D-3) 5000 UNITS TABS Take 5,000 Units by mouth daily.     Docusate Sodium (DSS) 100 MG CAPS Take by mouth as needed.     ELIQUIS 5 MG TABS tablet TAKE 1 TABLET BY MOUTH TWICE A DAY 60 tablet 5   ezetimibe (ZETIA) 10 MG tablet Take 0.5 tablets (5 mg total) by mouth daily. 15 tablet 11   Ferrous Sulfate 27 MG TABS Take 27 mg by mouth 2 (two) times daily.      fluticasone furoate-vilanterol (BREO ELLIPTA) 100-25 MCG/INH AEPB Inhale 1 puff into the lungs every morning. As needed     JARDIANCE 10 MG TABS tablet TAKE 1 TABLET BY MOUTH DAILY BEFORE BREAKFAST. (Patient taking differently: Take 10 mg by mouth daily.) 90 tablet 3   ketoconazole (NIZORAL) 2 % cream Apply 1 application topically as needed.     metoprolol succinate (TOPROL-XL) 100 MG 24 hr tablet  Take 1 tablet (100 mg total) by mouth 2 (two) times daily. 180 tablet 3   nitroGLYCERIN (NITROSTAT) 0.4 MG SL tablet Place 1 tablet (0.4 mg total) under the tongue every 5 (five) minutes as needed for chest pain. 30 tablet 1   Omega-3 1000 MG CAPS Take 1,000 mg by mouth every morning.     potassium chloride (KLOR-CON) 10 MEQ tablet TAKE 5 TABLETS (50 MEQ TOTAL) BY MOUTH DAILY. 120 tablet 3   RESTASIS 0.05 % ophthalmic emulsion Place 1 drop into both eyes daily as needed.     rosuvastatin (CRESTOR) 40 MG tablet Take 1 tablet (40 mg total) by mouth at bedtime. 90 tablet 3   Tafamidis Meglumine, Cardiac, (VYNDAQEL) 20 MG CAPS Take 4 capsules (80 mg)  by mouth daily. (Patient taking differently: Take 40 mg by mouth in the morning and at bedtime.) 120 capsule 11   tirzepatide (MOUNJARO) 2.5 MG/0.5ML Pen Inject  2.5 mg into the skin once a week. 2 mL 0   torsemide (DEMADEX) 20 MG tablet Take 4 tablets (80 mg total) by mouth daily. 180 tablet 3   No current facility-administered medications on file prior to visit.    Allergies  Allergen Reactions   Tramadol Nausea And Vomiting   Spironolactone Itching      Diabetes mellitus type 2 in obese Assessment/plan  Last A1c 6.3% current Wt at home 339 lbs BMI 56  Was on Rybelsus - ineffective for weight loss  On Jardiance 10 mg for HF  Switching oral semaglutide to [facility] once week  Patient self injected 1st dose in the office after getting trained on demo pen  Will titrate dose as per patient's tolerability in next 4 weeks  Patient to start eating healthy balanced small meals  Discussed potential s/e of Mounjaro and its management, importance of dose titration to achieve weight loss  Cammy Copa, Pharm.D [facility] [address], [place], [place] [address]  Phone: [phone]; Fax: [phone]

## 2022-08-04 ENCOUNTER — Telehealth (HOSPITAL_COMMUNITY): Payer: Self-pay | Admitting: Pharmacist

## 2022-08-04 ENCOUNTER — Ambulatory Visit (HOSPITAL_COMMUNITY)
Admission: RE | Admit: 2022-08-04 | Discharge: 2022-08-04 | Disposition: A | Discharge status: Home / Self Care | Payer: Medicare HMO | Source: Ambulatory Visit | Attending: Cardiology | Admitting: Cardiology

## 2022-08-04 ENCOUNTER — Encounter (HOSPITAL_COMMUNITY): Payer: Self-pay | Admitting: Cardiology

## 2022-08-04 VITALS — BP 120/70 | HR 75 | Wt 349.0 lb

## 2022-08-04 DIAGNOSIS — H9319 Tinnitus, unspecified ear: Secondary | ICD-10-CM | POA: Insufficient documentation

## 2022-08-04 DIAGNOSIS — R42 Dizziness and giddiness: Secondary | ICD-10-CM | POA: Diagnosis not present

## 2022-08-04 DIAGNOSIS — I2510 Atherosclerotic heart disease of native coronary artery without angina pectoris: Secondary | ICD-10-CM | POA: Insufficient documentation

## 2022-08-04 DIAGNOSIS — G5603 Carpal tunnel syndrome, bilateral upper limbs: Secondary | ICD-10-CM | POA: Diagnosis not present

## 2022-08-04 DIAGNOSIS — Z6841 Body Mass Index (BMI) 40.0 and over, adult: Secondary | ICD-10-CM | POA: Insufficient documentation

## 2022-08-04 DIAGNOSIS — Z86018 Personal history of other benign neoplasm: Secondary | ICD-10-CM | POA: Insufficient documentation

## 2022-08-04 DIAGNOSIS — E854 Organ-limited amyloidosis: Secondary | ICD-10-CM | POA: Diagnosis not present

## 2022-08-04 DIAGNOSIS — I480 Paroxysmal atrial fibrillation: Secondary | ICD-10-CM | POA: Insufficient documentation

## 2022-08-04 DIAGNOSIS — I5022 Chronic systolic (congestive) heart failure: Secondary | ICD-10-CM | POA: Diagnosis not present

## 2022-08-04 DIAGNOSIS — I43 Cardiomyopathy in diseases classified elsewhere: Secondary | ICD-10-CM | POA: Insufficient documentation

## 2022-08-04 DIAGNOSIS — I110 Hypertensive heart disease with heart failure: Secondary | ICD-10-CM | POA: Insufficient documentation

## 2022-08-04 DIAGNOSIS — G4733 Obstructive sleep apnea (adult) (pediatric): Secondary | ICD-10-CM | POA: Insufficient documentation

## 2022-08-04 DIAGNOSIS — I272 Pulmonary hypertension, unspecified: Secondary | ICD-10-CM | POA: Diagnosis not present

## 2022-08-04 DIAGNOSIS — Z7951 Long term (current) use of inhaled steroids: Secondary | ICD-10-CM | POA: Diagnosis not present

## 2022-08-04 DIAGNOSIS — I350 Nonrheumatic aortic (valve) stenosis: Secondary | ICD-10-CM | POA: Diagnosis not present

## 2022-08-04 DIAGNOSIS — Z79899 Other long term (current) drug therapy: Secondary | ICD-10-CM | POA: Diagnosis not present

## 2022-08-04 DIAGNOSIS — Z7984 Long term (current) use of oral hypoglycemic drugs: Secondary | ICD-10-CM | POA: Insufficient documentation

## 2022-08-04 DIAGNOSIS — I5042 Chronic combined systolic (congestive) and diastolic (congestive) heart failure: Secondary | ICD-10-CM | POA: Diagnosis present

## 2022-08-04 DIAGNOSIS — Z8719 Personal history of other diseases of the digestive system: Secondary | ICD-10-CM | POA: Diagnosis not present

## 2022-08-04 DIAGNOSIS — Z7901 Long term (current) use of anticoagulants: Secondary | ICD-10-CM | POA: Insufficient documentation

## 2022-08-04 DIAGNOSIS — I428 Other cardiomyopathies: Secondary | ICD-10-CM | POA: Diagnosis not present

## 2022-08-04 DIAGNOSIS — I082 Rheumatic disorders of both aortic and tricuspid valves: Secondary | ICD-10-CM | POA: Diagnosis not present

## 2022-08-04 LAB — BRAIN NATRIURETIC PEPTIDE: B Natriuretic Peptide: 199 pg/mL — ABNORMAL HIGH (ref 0.0–100.0)

## 2022-08-04 MED ORDER — NITROGLYCERIN 0.4 MG SL SUBL: 0.4000 mg | SUBLINGUAL_TABLET | SUBLINGUAL | 1 refills | Status: AC | PRN

## 2022-08-04 NOTE — Telephone Encounter (Signed)
Patient Advocate Encounter   Received notification from [facility] that prior authorization for Amvuttra is required.   PA submitted on CoverMyMeds Key [id number] Status is pending   Will continue to follow.   Audry Riles, PharmD, BCPS, BCCP, CPP Heart Failure Clinic Pharmacist [phone]

## 2022-08-04 NOTE — Progress Notes (Incomplete)
Date:  08/04/2022   ID:  Kimberly [name], DOB 17-Dec-1955, MRN [id number]   Provider location: [facility] Type of Visit: Established patient   PCP:  Kimberly Hummingbird, PA-C  HF Cardiologist:  Dr. Aundra [name]   History of Present Illness: Kimberly [name] is a 67 y.o. female who has a history of paroxysmal atrial fibrillation, obesity, and nonischemic cardiomyopathy.  She was admitted in 3/15 at [facility] with atrial fibrillation and CHF.  TEE was done in preparation for DCCV, showing EF 25-30% but there was LA thrombus so DCCV was not done.  Plan was for anticoagulation x 3 months followed by TEE-guided DCCV in the OR (due to patient's body habitus).  LHC was also done, showing mild nonobstructive CAD.  Patient was diuresed with IV Lasix in the hospital and started on apixaban.    Patient had TEE again in 6/15, showing EF 35-40% with diffuse hypokinesis and no LAA thrombus.  She was cardioverted to NSR (with difficulty).  I started her at that time on amiodarone.  She then went back into atrial fibrillation.  After she had been loaded on amiodarone, cardioverted her again in 7/15 to NSR.     She had gone back into atrial fibrillation in 1/18 and amiodarone was increased to bid in preparation for DCCV. However, she spontaneously converted to NSR.   PYP scan in 4/19 was grade 3, strongly suggestive of TTR amyloidosis.  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  Echo 8/19 showed EF 60%, moderate LVH, mild aortic stenosis.  Of note, she has carpal tunnel syndrome.  She was started on tafamidis.    She went back into atrial fibrillation and had DCCV in 2/20 but was back in atrial fibrillation a few weeks later when she went to atrial fibrillation clinic.  Dr. Rayann [name] thought that morbid obesity precluded atrial fibrillation ablation and concerns about compliance led them not to start her on Tikosyn.  Instead, it was recommended that she start on CPAP for her  OSA, lose weight, and re-attempt DCCV while on amiodarone.   She developed vertigo and tinnitus and was found to have a vestibular schwannoma, she had gamma knife surgery at [facility].  In 7/21, she developed hematochezia and had EGD/colonoscopy which were unrevealing except for internal hemorrhoids.   Echo in 10/22 showed EF 60-65%, normal RV, moderate AS mean gradient 20 mmHg with AVA 1.56 cm^2, mild AI.   Echo 04/08/22  EF 55-60%, mild LVH, mildly decreased RV systolic function with normal RV size, PASP 47 mmHg, paradoxical low flow/low gradient severe AS (mean 25 mmHg, AVA 0.6 cm^2).   Admitted after TEE/LHC/RHC in 11/23 for aortic stenosis workup. TEE showed an abnormal trileaflet aortic valve with doming of the right coronary cusp. There was severe aortic stenosis with moderate aortic insufficiency. LHC showed nonobstructive CAD. RHC showed elevated right and left heart filling pressures with pulmonary venous hypertension. Admitted with acute HFpEF. Diuresed well with IV lasix, weight down 16 lbs during admission. Home diuretics regimen adjusted with torsemide increased to 80 mg daily. She was referred to structural heart team for possible TAVR.   She saw [facility] who thought that she would be best-suited for TAVR.  She saw Dr Kimberly [name] who was concerned about the position of her RCA and worried about disruption of the ostium of the vessel with TAVR.  He recommended weight loss and SAVR.  She agreed to start [facility]. Echo was repeated in 2/24,  showing EF 55-60%, mild LVH, mild RV dysfunction, severe MR with AVA 0.8 cm^2 and mean gradient 33 mmHg.  Today she returns for HF follow up. Weight down 2 lbs.  She started [facility] last week.  She has occasional vertigo episodes.  She is not short of breath walking on flat ground. She gets short of breath with stairs or inclines.  No lightheadedness.  No chest pain.   ECG (personally reviewed): atrial fibrillation rate 72  Labs (4/19): LFTs normal, K 4,  creatinine 1.24, TSH normal, immunofixation normal Labs (8/19): TSH normal, K 3.8, creatinine 1.27, LFTs normal, TSH normal Labs (11/19): K 3.8, creatinine 1.21 Labs (2/20): LDL 114, hgb 10.5 Labs (3/20): K 4.4, creatinine 1.42 Labs (4/20): K 4.2, creatinine 1.23 Labs (7/21): K 3.8, creatinine 1.23 Labs (8/21): hgb 11.2 Labs (10/21): creatinine 2.1 Labs (5/22): K 3.5, creatinine 1.46 Labs (8/22): K 3.4, creatinine 1.26 Labs (1/23): BNP 127, K 4, creatinine 1.4, LDL 112 Labs (4/23): Scr 1.3, K 3.7, LDL 96  Labs (7/23): BNP 110 Labs (11/23): K 3.7, creatinine 1.3  Labs (1/24): K 3.7, creatinine 1.29, hgb 11.2, BNP 154   PMH: 1. Obese 2. Atrial fibrillation: Paroxysmal => chronic.  Unable to cardiovert in 3/15 due to LAA thrombus noted on TEE. TEE in 6/15 without LAA thrombus.  Patient had cardioversion to NSR with difficulty but back in atrial fibrillation by 7/15 appt.  She was started on amiodarone and cardioverted in 7/15.   - DCCV in 2/20 but back in atrial fibrillation by 3/20, has been in atrial fibrillation since that time.  3. H/o TKR 4. Nonischemic cardiomyopathy: TEE (3/15) with EF 25-30%, moderate MR, PA systolic pressure 51 mmHg, moderate to severe TR, LA thrombus was noted.   LHC (3/15) with 50% stenosis small PLV.  TEE (6/15) with EF 35-40%, diffuse hypokinesis, mild MR, moderate TR, +PFO, no LA appendage thrombus.  - Echo (10/15) with EF 60-65%.   - ECHO 06/10/2016 EF 60-65% Mild AS - Echo (8/19): EF 60%, moderate LVH, mild aortic stenosis.  - PYP scan in 4/19 was grade 3, strongly suggestive of transthyretin amyloidosis.  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  - Echo (11/21): EF 55-60%, mild LV dilation, RV normal, mild-moderate AS with mean gradient 17 mmHg and AVA 1.07 cm^2.  - Echo (10/22): EF 60-65%, normal RV, moderate AS mean gradient 20 mmHg with AVA 1.56 cm^2, mild AI.  - Echo (11/23): EF 55-60%, mild LVH, mildly decreased RV systolic function with normal RV size,  PASP 47 mmHg, paradoxical low flow/low gradient severe AS (mean 25 mmHg, AVA 0.6 cm^2). - TEE 04/14/22: EF 55-60%, RV okay, moderate LAE, moderate AI, severe AS with mean gradient of 40 mmHg, AVA by VTI 0.86 cm2 and 0.71 cm2 by planimetry - RHC (11/23): mean RA 16, PA 49/31 mean 37, mean PCWP 26, CI 2.47, PVR 1.7 WU.  - Echo (2/24): EF 55-60%, mild LVH, mild RV dysfunction, severe MR with AVA 0.8 cm^2 and mean gradient 33 mmHg. 5. CAD: Nonobstructive.  LHC (3/15) with 50% stenosis small PLV. - LHC 04/14/22: 40% stenosis distal LAD. 6. PFO 7. Left Breast mass: Was taken for lumpectomy in 1/18 but mass had resolved.  8. Aortic stenosis: Moderate on 10/22 echo.  - Paradoxical low flow/low gradient severe AS on 11/23 echo.  9. Carpal tunnel syndrome.  10. Vestibular schwannoma s/p gamma knife surgery at [facility].  11. OSA: Not currently using CPAP.  12. Hematochezia: C-scope/EGD in 7/21 showed only internal  hemorrhoids.  13. 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   14. Aortic stenosis: Severe AS by TEE in 11/23 and echo in 2/24.   Current Outpatient Medications  Medication Sig Dispense Refill  . acetaminophen (TYLENOL) 650 MG CR tablet Take 1,300 mg by mouth every 8 (eight) hours as needed for pain.     . benzonatate (TESSALON) 100 MG capsule Take 100 mg by mouth 3 (three) times daily as needed.    . Cholecalciferol (VITAMIN D-3) 5000 UNITS TABS Take 5,000 Units by mouth daily.    Mariane Baumgarten Sodium (DSS) 100 MG CAPS Take by mouth as needed.    [name] ELIQUIS 5 MG TABS tablet TAKE 1 TABLET BY MOUTH TWICE A DAY 60 tablet 5  . Ferrous Sulfate 27 MG TABS Take 27 mg by mouth 2 (two) times daily.     . fluticasone furoate-vilanterol (BREO ELLIPTA) 100-25 MCG/INH AEPB Inhale 1 puff into the lungs every morning. As needed    . JARDIANCE 10 MG TABS tablet TAKE 1 TABLET BY MOUTH DAILY BEFORE BREAKFAST. 90 tablet 3  . ketoconazole (NIZORAL) 2 % cream Apply 1 application topically as needed.    .  metoprolol succinate (TOPROL-XL) 100 MG 24 hr tablet Take 1 tablet (100 mg total) by mouth 2 (two) times daily. 180 tablet 3  . Omega-3 1000 MG CAPS Take 1,000 mg by mouth every morning.    . potassium chloride (KLOR-CON) 10 MEQ tablet TAKE 5 TABLETS (50 MEQ TOTAL) BY MOUTH DAILY. 120 tablet 3  . RESTASIS 0.05 % ophthalmic emulsion Place 1 drop into both eyes daily as needed.    . rosuvastatin (CRESTOR) 40 MG tablet Take 1 tablet (40 mg total) by mouth at bedtime. 90 tablet 3  . Tafamidis Meglumine, Cardiac, (VYNDAQEL) 20 MG CAPS Take 4 capsules (80 mg)  by mouth daily. 120 capsule 11  . tirzepatide [facility]) 2.5 MG/0.5ML Pen Inject 2.5 mg into the skin once a week. 2 mL 0  . torsemide (DEMADEX) 20 MG tablet Take 4 tablets (80 mg total) by mouth daily. 180 tablet 3  . ezetimibe (ZETIA) 10 MG tablet Take 0.5 tablets (5 mg total) by mouth daily. (Patient not taking: Reported on 08/04/2022) 15 tablet 11  . nitroGLYCERIN (NITROSTAT) 0.4 MG SL tablet Place 1 tablet (0.4 mg total) under the tongue every 5 (five) minutes as needed for chest pain. 30 tablet 1   No current facility-administered medications for this encounter.    Allergies:   Tramadol and Spironolactone   Social History:  The patient  reports that she has never smoked. She has never used smokeless tobacco. She reports that she does not drink alcohol and does not use drugs.   Family History:  The patient's family history includes CVA in her mother.   ROS:  Please see the history of present illness.   All other systems are personally reviewed and negative.   Vitals:   BP 120/70   Pulse 75   Wt (!) 158.3 kg (349 lb)   SpO2 96%   BMI 56.33 kg/m   Wt Readings from Last 3 Encounters:  08/04/22 (!) 158.3 kg (349 lb)  07/25/22 (!) 157.7 kg (347 lb 9.6 oz)  06/02/22 (!) 158.8 kg (350 lb 3.2 oz)   PHYSICAL EXAM: General: NAD, obese Neck: JVP 8 cm, no thyromegaly or thyroid nodule.  Lungs: Clear to auscultation bilaterally with  normal respiratory effort. CV: Nondisplaced PMI.  Heart irregular S1/S2, no S3/S4, 2/6 SEM RUSB.  No  peripheral edema.  No carotid bruit.  Normal pedal pulses.  Abdomen: Soft, nontender, no hepatosplenomegaly, no distention.  Skin: Intact without lesions or rashes.  Neurologic: Alert and oriented x 3.  Psych: Normal affect. Extremities: No clubbing or cyanosis.  HEENT: Normal.   ASSESSMENT AND PLAN: 1. 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  EF 35-40% on TEE in 6/15 but then EF back to 60-65% by 10/15 and remained normal on echo in 8/19 and in 10/22.  LHC without significant coronary disease in 3/15.  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  Stable NYHA class II-III. She is not significantly volume overloaded and weight is down.  - Continue torsemide 80 mg daily + KCl 50 mEq daily. BMET and BNP today. - Continue Toprol XL 100 mg bid. - Continue empagliflozin 10 mg daily.  2. 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  Atrial fibrillation and carpal tunnel syndrome, both of which she has, go along with amyloidosis.  Her history of orthostatic symptoms as well as symptoms in toes and feet consistent with peripheral neuropathy are also likely related to amyloidosis.  - Continue tafamidis.   - Pt declined vutrisiran.  - She was seen by  Dr. 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 Given autosomal dominant condition, Krystalle's daughter is at a 50% risk of inheriting the pathogenic variant in TTR (V142I). Genetic testing for the familial pathogenic variant was strongly recommended but pt's daughter declined.  3. Atrial fibrillation: Last DCCV in 2/20 but  back in atrial fibrillation since then.  Dr. Rayann [name] saw, decided against atrial fibrillation ablation due to morbid obesity.  EP decided against Tikosyn due to compliance concerns. She failed amiodarone.  She remains in atrial fibrillation, I think that the atrial fibrillation is likely permanent at this point.  Rate controlled w/  blocker. - Continue Toprol XL 100 mg bid.  - Continue Eliquis.  With recent BRBpR, check CBC 4. CAD: Mild, nonobstructive by cath 07/2013. No chest pain.  - No ASA given Eliquis use.   - Continue Crestor 40 mg daily and Zetia.  5. Bilateral carpal tunnel symptoms: Likely related to amyloidosis.  6. OSA: She says that she cannot tolerate CPAP due to tinnitus.  7.  HTN: BP controlled.  8.  Aortic stenosis: TEE 04/14/22 showed severe AS with mean gradient 40 mmHg, AVA 0.86 by VTI and 0.71 by planimetry with moderate aortic regurgitation. The valve appeared trileaflet but the right cusp appeared to dome.  Interestingly, peak-to-peak aortic gradient was only 18 mmHg.   - Do not think she would be a good SAVR candidate with obesity and comorbidities. She is being followed by the structural heart team and CVTS physicians and will soon decide on next steps, TAVR vs SAVR. 9. Obesity: Body mass index is 56.33 kg/m. She would benefit from semaglutide but does not want injection.  She is now on Rybelsus.  Follow up in 8 weeks with Dr. Aundra [name]. I discussed this appointment may need to change based on Structural Heart Team's recommendations for proceeding with her valve replacement.  Signed, [name], MD  08/04/2022  [place] [address] [facility] [place] [address] [phone] (office) [phone] (fax)

## 2022-08-04 NOTE — Progress Notes (Signed)
Date:  08/04/2022   ID:  Kimberly [name], DOB 1955/07/18, MRN [id number]   Provider location: [place] [facility] Type of Visit: Established patient   PCP:  Windell Hummingbird, PA-C  HF Cardiologist:  Dr. Aundra Dubin   History of Present Illness: Kimberly [name] is a 67 y.o. female who has a history of paroxysmal atrial fibrillation, obesity, and nonischemic cardiomyopathy.  She was admitted in 3/15 at [facility] with atrial fibrillation and CHF.  TEE was done in preparation for DCCV, showing EF 25-30% but there was LA thrombus so DCCV was not done.  Plan was for anticoagulation x 3 months followed by TEE-guided DCCV in the OR (due to patient's body habitus).  LHC was also done, showing mild nonobstructive CAD.  Patient was diuresed with IV Lasix in the hospital and started on apixaban.    Patient had TEE again in 6/15, showing EF 35-40% with diffuse hypokinesis and no LAA thrombus.  She was cardioverted to NSR (with difficulty).  I started her at that time on amiodarone.  She then went back into atrial fibrillation.  After she had been loaded on amiodarone, cardioverted her again in 7/15 to NSR.     She had gone back into atrial fibrillation in 1/18 and amiodarone was increased to bid in preparation for DCCV. However, she spontaneously converted to NSR.   PYP scan in 4/19 was grade 3, strongly suggestive of TTR amyloidosis.  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  Echo 8/19 showed EF 60%, moderate LVH, mild aortic stenosis.  Of note, she has carpal tunnel syndrome.  She was started on tafamidis.    She went back into atrial fibrillation and had DCCV in 2/20 but was back in atrial fibrillation a few weeks later when she went to atrial fibrillation clinic.  Dr. Rayann Heman thought that morbid obesity precluded atrial fibrillation ablation and concerns about compliance led them not to start her on Tikosyn.  Instead, it was recommended that she start on CPAP for her  OSA, lose weight, and re-attempt DCCV while on amiodarone.   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  In 7/21, she developed hematochezia and had EGD/colonoscopy which were unrevealing except for internal hemorrhoids.   Echo in 10/22 showed EF 60-65%, normal RV, moderate AS mean gradient 20 mmHg with AVA 1.56 cm^2, mild AI.   Echo 04/08/22  EF 55-60%, mild LVH, mildly decreased RV systolic function with normal RV size, PASP 47 mmHg, paradoxical low flow/low gradient severe AS (mean 25 mmHg, AVA 0.6 cm^2).   Admitted after TEE/LHC/RHC in 11/23 for aortic stenosis workup. TEE showed an abnormal trileaflet aortic valve with doming of the right coronary cusp. There was severe aortic stenosis with moderate aortic insufficiency. LHC showed nonobstructive CAD. RHC showed elevated right and left heart filling pressures with pulmonary venous hypertension. Admitted with acute HFpEF. Diuresed well with IV lasix, weight down 16 lbs during admission. Home diuretics regimen adjusted with torsemide increased to 80 mg daily. She was referred to structural heart team for possible TAVR.   She saw [facility] who thought that she would be best-suited for TAVR.  She saw Dr Ali Lowe who was concerned about the position of her RCA and worried about disruption of the ostium of the vessel with TAVR.  He recommended weight loss and SAVR.  She agreed to start [facility]. Echo was repeated in 2/24,  showing EF 55-60%, mild LVH, mild RV dysfunction, severe MR with AVA 0.8 cm^2 and mean gradient 33 mmHg.  Today she returns for HF follow up. Weight down 2 lbs.  She started [facility] last week.  She has occasional vertigo episodes.  She is not short of breath walking on flat ground. She gets short of breath with stairs or inclines.  No lightheadedness.  No chest pain.   ECG (personally reviewed): atrial fibrillation rate 72  Labs (4/19): LFTs normal, K 4,  creatinine 1.24, TSH normal, immunofixation normal Labs (8/19): TSH normal, K 3.8, creatinine 1.27, LFTs normal, TSH normal Labs (11/19): K 3.8, creatinine 1.21 Labs (2/20): LDL 114, hgb 10.5 Labs (3/20): K 4.4, creatinine 1.42 Labs (4/20): K 4.2, creatinine 1.23 Labs (7/21): K 3.8, creatinine 1.23 Labs (8/21): hgb 11.2 Labs (10/21): creatinine 2.1 Labs (5/22): K 3.5, creatinine 1.46 Labs (8/22): K 3.4, creatinine 1.26 Labs (1/23): BNP 127, K 4, creatinine 1.4, LDL 112 Labs (4/23): Scr 1.3, K 3.7, LDL 96  Labs (7/23): BNP 110 Labs (11/23): K 3.7, creatinine 1.3  Labs (1/24): K 3.7, creatinine 1.29, hgb 11.2, BNP 154   PMH: 1. Obese 2. Atrial fibrillation: Paroxysmal => chronic.  Unable to cardiovert in 3/15 due to LAA thrombus noted on TEE. TEE in 6/15 without LAA thrombus.  Patient had cardioversion to NSR with difficulty but back in atrial fibrillation by 7/15 appt.  She was started on amiodarone and cardioverted in 7/15.   - DCCV in 2/20 but back in atrial fibrillation by 3/20, has been in atrial fibrillation since that time.  3. H/o TKR 4. Nonischemic cardiomyopathy: TEE (3/15) with EF 25-30%, moderate MR, PA systolic pressure 51 mmHg, moderate to severe TR, LA thrombus was noted.   LHC (3/15) with 50% stenosis small PLV.  TEE (6/15) with EF 35-40%, diffuse hypokinesis, mild MR, moderate TR, +PFO, no LA appendage thrombus.  - Echo (10/15) with EF 60-65%.   - ECHO 06/10/2016 EF 60-65% Mild AS - Echo (8/19): EF 60%, moderate LVH, mild aortic stenosis.  - PYP scan in 4/19 was grade 3, strongly suggestive of transthyretin amyloidosis.  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  - Echo (11/21): EF 55-60%, mild LV dilation, RV normal, mild-moderate AS with mean gradient 17 mmHg and AVA 1.07 cm^2.  - Echo (10/22): EF 60-65%, normal RV, moderate AS mean gradient 20 mmHg with AVA 1.56 cm^2, mild AI.  - Echo (11/23): EF 55-60%, mild LVH, mildly decreased RV systolic function with normal RV size,  PASP 47 mmHg, paradoxical low flow/low gradient severe AS (mean 25 mmHg, AVA 0.6 cm^2). - TEE 04/14/22: EF 55-60%, RV okay, moderate LAE, moderate AI, severe AS with mean gradient of 40 mmHg, AVA by VTI 0.86 cm2 and 0.71 cm2 by planimetry - RHC (11/23): mean RA 16, PA 49/31 mean 37, mean PCWP 26, CI 2.47, PVR 1.7 WU.  - Echo (2/24): EF 55-60%, mild LVH, mild RV dysfunction, severe MR with AVA 0.8 cm^2 and mean gradient 33 mmHg. 5. CAD: Nonobstructive.  LHC (3/15) with 50% stenosis small PLV. - LHC 04/14/22: 40% stenosis distal LAD. 6. PFO 7. Left Breast mass: Was taken for lumpectomy in 1/18 but mass had resolved.  8. Aortic stenosis: Moderate on 10/22 echo.  - Paradoxical low flow/low gradient severe AS on 11/23 echo.  9. Carpal tunnel syndrome.  10. Vestibular schwannoma s/p gamma knife surgery at [facility].  11. OSA: Not currently using CPAP.  12. Hematochezia: C-scope/EGD in 7/21 showed only internal  hemorrhoids.  13. 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   14. Aortic stenosis: Severe AS by TEE in 11/23 and echo in 2/24.   Current Outpatient Medications  Medication Sig Dispense Refill   acetaminophen (TYLENOL) 650 MG CR tablet Take 1,300 mg by mouth every 8 (eight) hours as needed for pain.      benzonatate (TESSALON) 100 MG capsule Take 100 mg by mouth 3 (three) times daily as needed.     Cholecalciferol (VITAMIN D-3) 5000 UNITS TABS Take 5,000 Units by mouth daily.     Docusate Sodium (DSS) 100 MG CAPS Take by mouth as needed.     ELIQUIS 5 MG TABS tablet TAKE 1 TABLET BY MOUTH TWICE A DAY 60 tablet 5   Ferrous Sulfate 27 MG TABS Take 27 mg by mouth 2 (two) times daily.      fluticasone furoate-vilanterol (BREO ELLIPTA) 100-25 MCG/INH AEPB Inhale 1 puff into the lungs every morning. As needed     JARDIANCE 10 MG TABS tablet TAKE 1 TABLET BY MOUTH DAILY BEFORE BREAKFAST. 90 tablet 3   ketoconazole (NIZORAL) 2 % cream Apply 1 application topically as needed.     metoprolol  succinate (TOPROL-XL) 100 MG 24 hr tablet Take 1 tablet (100 mg total) by mouth 2 (two) times daily. 180 tablet 3   Omega-3 1000 MG CAPS Take 1,000 mg by mouth every morning.     potassium chloride (KLOR-CON) 10 MEQ tablet TAKE 5 TABLETS (50 MEQ TOTAL) BY MOUTH DAILY. 120 tablet 3   RESTASIS 0.05 % ophthalmic emulsion Place 1 drop into both eyes daily as needed.     rosuvastatin (CRESTOR) 40 MG tablet Take 1 tablet (40 mg total) by mouth at bedtime. 90 tablet 3   Tafamidis Meglumine, Cardiac, (VYNDAQEL) 20 MG CAPS Take 4 capsules (80 mg)  by mouth daily. 120 capsule 11   tirzepatide (MOUNJARO) 2.5 MG/0.5ML Pen Inject 2.5 mg into the skin once a week. 2 mL 0   torsemide (DEMADEX) 20 MG tablet Take 4 tablets (80 mg total) by mouth daily. 180 tablet 3   ezetimibe (ZETIA) 10 MG tablet Take 0.5 tablets (5 mg total) by mouth daily. (Patient not taking: Reported on 08/04/2022) 15 tablet 11   nitroGLYCERIN (NITROSTAT) 0.4 MG SL tablet Place 1 tablet (0.4 mg total) under the tongue every 5 (five) minutes as needed for chest pain. 30 tablet 1   No current facility-administered medications for this encounter.    Allergies:   Tramadol and Spironolactone   Social History:  The patient  reports that she has never smoked. She has never used smokeless tobacco. She reports that she does not drink alcohol and does not use drugs.   Family History:  The patient's family history includes CVA in her mother.   ROS:  Please see the history of present illness.   All other systems are personally reviewed and negative.   Vitals:   BP 120/70   Pulse 75   Wt (!) 158.3 kg (349 lb)   SpO2 96%   BMI 56.33 kg/m   Wt Readings from Last 3 Encounters:  08/04/22 (!) 158.3 kg (349 lb)  07/25/22 (!) 157.7 kg (347 lb 9.6 oz)  06/02/22 (!) 158.8 kg (350 lb 3.2 oz)   PHYSICAL EXAM: General: NAD, obese Neck: JVP 8 cm, no thyromegaly or thyroid nodule.  Lungs: Clear to auscultation bilaterally with normal respiratory  effort. CV: Nondisplaced PMI.  Heart irregular S1/S2, no S3/S4, 2/6 SEM RUSB.  No  peripheral edema.  No carotid bruit.  Normal pedal pulses.  Abdomen: Soft, nontender, no hepatosplenomegaly, no distention.  Skin: Intact without lesions or rashes.  Neurologic: Alert and oriented x 3.  Psych: Normal affect. Extremities: No clubbing or cyanosis.  HEENT: Normal.   ASSESSMENT AND PLAN: 1. 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  EF 35-40% on TEE in 6/15 but then EF back to 60-65% by 10/15 and remained normal on echo in 8/19 and in 10/22.  LHC without significant coronary disease in 3/15.  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  Stable NYHA class II-III. She is not significantly volume overloaded and weight is down.  - Continue torsemide 80 mg daily + KCl 50 mEq daily. BMET/BNP today. - Continue Toprol XL 100 mg bid. - Continue empagliflozin 10 mg daily.  2. 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  Atrial fibrillation and carpal tunnel syndrome, both of which she has, go along with amyloidosis.  Her history of orthostatic symptoms as well as symptoms in toes and feet consistent with peripheral neuropathy are also likely related to amyloidosis.  - Continue tafamidis.   - She is willing to start [facility] or Amvuttra.  Will ask our HF pharmacist to work on one of these meds.  - She was seen by  Dr. 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 Given autosomal dominant condition, Aolanis's daughter is at a 50% risk of inheriting the pathogenic variant in TTR (V142I). Genetic testing for the familial pathogenic variant was recommended but pt's daughter declined.   3. Atrial fibrillation: Last DCCV in 2/20 but back in atrial fibrillation since then.  Dr. Rayann Heman saw, decided against atrial fibrillation ablation due to morbid obesity.  EP decided against Tikosyn due to compliance concerns. She failed amiodarone.  She remains in atrial fibrillation, I think that the atrial fibrillation is likely permanent at this point.  Rate controlled w/ ? blocker. - Continue Toprol XL 100 mg bid.  - Continue Eliquis.   4. CAD: Mild, nonobstructive by cath 07/2013. No chest pain.  - No ASA given Eliquis use.   - Continue Crestor 40 mg daily and Zetia.  5. Bilateral carpal tunnel symptoms: Likely related to amyloidosis.  6. OSA: She says that she cannot tolerate CPAP due to tinnitus.  7.  HTN: BP controlled.  8.  Aortic stenosis: TEE 04/14/22 showed severe AS with mean gradient 40 mmHg, AVA 0.86 by VTI and 0.71 by planimetry with moderate aortic regurgitation. Echo in 2/24 with severe AS.  The valve appeared trileaflet but the right cusp appeared to dome.  Not good TAVR candidate due to RCA position.  Seen in structural heart clinic and recommended to lose weight and proceed with SAVR after weight loss.   - Will send back to Dr. Tenny Craw for SAVR when her weight is down.  9. Obesity: Body mass index is 56.33 kg/m.  - She has just started [facility].   Signed, [name], MD  08/04/2022  [place] [address] [facility] [place] [address] [phone] (office) [phone] (fax)

## 2022-08-04 NOTE — Patient Instructions (Signed)
  Labs done today, your results will be available in MyChart, we will contact you for abnormal readings.  Your physician recommends that you schedule a follow-up appointment in: 3 months  If you have any questions or concerns before your next appointment please send [place] a message through [place] or call our office at [phone].    TO LEAVE A MESSAGE FOR THE NURSE SELECT OPTION 2, PLEASE LEAVE A MESSAGE INCLUDING: YOUR NAME DATE OF BIRTH CALL BACK NUMBER REASON FOR CALL**this is important as we prioritize the call backs  YOU WILL RECEIVE A CALL BACK THE SAME DAY AS LONG AS YOU CALL BEFORE 4:00 PM  At the [facility], you and your health needs are our priority. As part of our continuing mission to provide you with exceptional heart care, we have created designated Provider Care Teams. These Care Teams include your primary Cardiologist (physician) and Advanced Practice Providers (APPs- Physician Assistants and Nurse Practitioners) who all work together to provide you with the care you need, when you need it.   You may see any of the following providers on your designated Care Team at your next follow up: Dr Glori Bickers Dr [name] Dr. Roxana Hires, NP Lyda Jester, [place] [facility] [place], [place] Forestine Na, NP Audry Riles, PharmD   Please be sure to bring in all your medications bottles to every appointment.    Thank you for choosing [facility]

## 2022-08-05 ENCOUNTER — Other Ambulatory Visit (HOSPITAL_COMMUNITY): Payer: Self-pay

## 2022-08-05 ENCOUNTER — Other Ambulatory Visit: Payer: Self-pay

## 2022-08-05 NOTE — Telephone Encounter (Signed)
Patient has been approved to receive Amvuttra. Called patient to schedule appointment for injection. Left VM.   Audry Riles, PharmD, BCPS, BCCP, CPP Heart Failure Clinic Pharmacist [phone]

## 2022-08-05 NOTE — Telephone Encounter (Signed)
Advanced Heart Failure Patient Advocate Encounter  Prior Authorization for Amvuttra has been approved.    Effective dates: 08/05/22 through 05/19/23  Patients co-pay is $0.00  Audry Riles, PharmD, BCPS, BCCP, CPP Heart Failure Clinic Pharmacist [phone]

## 2022-08-05 NOTE — Telephone Encounter (Signed)
Start form for Amvuttra faxed to [place], PharmD, BCPS, BCCP, CPP Heart Failure Clinic Pharmacist [phone]

## 2022-08-06 ENCOUNTER — Other Ambulatory Visit (HOSPITAL_COMMUNITY): Payer: Self-pay

## 2022-08-06 ENCOUNTER — Other Ambulatory Visit (HOSPITAL_COMMUNITY): Payer: Self-pay | Admitting: Pharmacist

## 2022-08-06 ENCOUNTER — Other Ambulatory Visit: Payer: Self-pay

## 2022-08-06 MED ORDER — AMVUTTRA 25 MG/0.5ML ~~LOC~~ SOSY
25.0000 mg | PREFILLED_SYRINGE | SUBCUTANEOUS | 3 refills | Status: DC
  Filled 2022-08-06: qty 0.5, 90d supply, fill #0
  Filled 2022-10-24: qty 0.5, 90d supply, fill #1
  Filled 2023-01-23: qty 0.5, 90d supply, fill #2
  Filled 2023-05-05: qty 0.5, 90d supply, fill #3

## 2022-08-07 ENCOUNTER — Other Ambulatory Visit (HOSPITAL_COMMUNITY): Payer: Self-pay

## 2022-08-07 ENCOUNTER — Other Ambulatory Visit: Payer: Self-pay

## 2022-08-08 ENCOUNTER — Other Ambulatory Visit (HOSPITAL_COMMUNITY): Payer: Self-pay

## 2022-08-08 ENCOUNTER — Other Ambulatory Visit: Payer: Self-pay

## 2022-08-12 ENCOUNTER — Other Ambulatory Visit (HOSPITAL_COMMUNITY): Payer: Self-pay

## 2022-08-12 ENCOUNTER — Ambulatory Visit (HOSPITAL_COMMUNITY)
Admission: RE | Admit: 2022-08-12 | Discharge: 2022-08-12 | Disposition: A | Discharge status: Home / Self Care | Payer: Medicare HMO | Source: Ambulatory Visit | Attending: Internal Medicine | Admitting: Internal Medicine

## 2022-08-12 ENCOUNTER — Other Ambulatory Visit: Payer: Self-pay

## 2022-08-12 DIAGNOSIS — I428 Other cardiomyopathies: Secondary | ICD-10-CM | POA: Insufficient documentation

## 2022-08-12 DIAGNOSIS — I5022 Chronic systolic (congestive) heart failure: Secondary | ICD-10-CM | POA: Diagnosis not present

## 2022-08-12 DIAGNOSIS — G63 Polyneuropathy in diseases classified elsewhere: Secondary | ICD-10-CM | POA: Diagnosis not present

## 2022-08-12 DIAGNOSIS — I480 Paroxysmal atrial fibrillation: Secondary | ICD-10-CM | POA: Diagnosis not present

## 2022-08-12 DIAGNOSIS — E851 Neuropathic heredofamilial amyloidosis: Secondary | ICD-10-CM

## 2022-08-12 DIAGNOSIS — G4733 Obstructive sleep apnea (adult) (pediatric): Secondary | ICD-10-CM | POA: Insufficient documentation

## 2022-08-12 DIAGNOSIS — Z79899 Other long term (current) drug therapy: Secondary | ICD-10-CM | POA: Insufficient documentation

## 2022-08-12 DIAGNOSIS — I5032 Chronic diastolic (congestive) heart failure: Secondary | ICD-10-CM | POA: Insufficient documentation

## 2022-08-12 DIAGNOSIS — E854 Organ-limited amyloidosis: Secondary | ICD-10-CM | POA: Insufficient documentation

## 2022-08-12 MED ORDER — VUTRISIRAN SODIUM 25 MG/0.5ML ~~LOC~~ SOSY
25.0000 mg | PREFILLED_SYRINGE | Freq: Once | SUBCUTANEOUS | Status: AC
  Administered 2022-08-12: 25 mg via SUBCUTANEOUS

## 2022-08-12 NOTE — Progress Notes (Signed)
Advanced Heart Failure Clinic Note   PCP:  Windell Hummingbird, PA-C  HF Cardiologist:  Dr. Aundra Dubin  HPI:  Kimberly [name] is a 67 y.o. female who has a history of paroxysmal atrial fibrillation, obesity, and nonischemic cardiomyopathy.  She was admitted in 07/2013 at [facility] with atrial fibrillation and CHF.  TEE was done in preparation for DCCV, showing EF 25-30% but there was LA thrombus so DCCV was not done.  Plan was for anticoagulation x 3 months followed by TEE-guided DCCV in the OR (due to patient's body habitus).  LHC was also done, showing mild nonobstructive CAD.  Patient was diuresed with IV Lasix in the hospital and started on apixaban.    Patient had TEE again in 10/2013, showing EF 35-40% with diffuse hypokinesis and no LAA thrombus.  She was cardioverted to NSR (with difficulty).  Was started on amiodarone.  She then went back into atrial fibrillation.  After she had been loaded on amiodarone, cardioverted her again in 11/2013 to NSR.     She had gone back into atrial fibrillation in 05/2016 and amiodarone was increased to BID in preparation for DCCV. However, she spontaneously converted to NSR.   PYP scan in 08/2017 was grade 3, strongly suggestive of TTR amyloidosis.  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  Echo 12/2017 showed EF 60%, moderate LVH, mild aortic stenosis.  Of note, she has carpal tunnel syndrome.  She was started on tafamidis.    She went back into atrial fibrillation and had DCCV in 06/2018 but was back in atrial fibrillation a few weeks later when she went to atrial fibrillation clinic.  Dr. Rayann Heman thought that morbid obesity precluded atrial fibrillation ablation and concerns about compliance led them not to start her on Tikosyn.  Instead, it was recommended that she start on CPAP for her OSA, lose weight, and re-attempt DCCV while on amiodarone.    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  In 7/21, she developed hematochezia and had EGD/colonoscopy which were unrevealing except for internal hemorrhoids.    Echo in 10/22 showed EF 60-65%, normal RV, moderate AS mean gradient 20 mmHg with AVA 1.56 cm^2, mild AI.    Echo 04/08/22  EF 55-60%, mild LVH, mildly decreased RV systolic function with normal RV size, PASP 47 mmHg, paradoxical low flow/low gradient severe AS (mean 25 mmHg, AVA 0.6 cm^2).    Admitted after TEE/LHC/RHC in 11/23 for aortic stenosis workup. TEE showed an abnormal trileaflet aortic valve with doming of the right coronary cusp. There was severe aortic stenosis with moderate aortic insufficiency. LHC showed nonobstructive CAD. RHC showed elevated right and left heart filling pressures with pulmonary venous hypertension. Admitted with acute HFpEF. Diuresed well with IV Lasix, weight down 16 lbs during admission. Home diuretics regimen adjusted with torsemide increased to 80 mg daily. She was referred to structural heart team for possible TAVR.    She saw [facility] who thought that she would be best-suited for TAVR.  She saw Dr Ali Lowe who was concerned about the position of her RCA and worried about disruption of the ostium of the vessel with TAVR.  He recommended weight loss and SAVR.  She agreed to start [facility]. Echo was repeated in 2/24, showing EF 55-60%, mild LVH, mild RV dysfunction, severe MR with AVA 0.8 cm^2 and mean gradient 33 mmHg.  Today he returns to HF clinic for  pharmacist medication titration. At last visit with MD she was referred to Pharmacy Clinic for [place] injection. Overall feeling ok today. Main complaint is dizziness/vertigo. She has made appointment with her ENT, but not able to be seen until May. She is currently on the cancellation list.  No contraindications to injection today.   Assessment/Plan: 1. 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  EF 35-40% on TEE in 10/2013 but then EF back to  60-65% by 02/2014 and remained normal on echo in 12/2017 and in 02/2021.  LHC without significant coronary disease in 07/2013.  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   - Stable NYHA class II-III.  - Continue torsemide 80 mg daily + KCl 50 mEq daily. - Continue Toprol XL 100 mg BID. - Continue empagliflozin 10 mg daily.  2. 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  Atrial fibrillation and carpal tunnel syndrome, both of which she has, go along with amyloidosis.  Her history of orthostatic symptoms as well as symptoms in toes and feet consistent with peripheral neuropathy are also likely related to amyloidosis.  - Continue tafamidis.   - Amvuttra (vutrisiran) injection administered in clinic today. Patient tolerated injection well. Provided patient counseling on Amvuttra. Most common side effects are injection site reactions, arthralgias and dyspnea. Patient is aware to return to clinic every 3 months for repeat injection. Amvuttra will be obtained from [facility] and couriered to clinic for injection.  - Start vitamin A supplement 8000 IU daily. Amvuttra decreases serum vitamin A levels. - She was seen by  Dr. 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 Given autosomal dominant condition, Jaanai's daughter is at a 50% risk of inheriting the pathogenic variant in TTR (V142I). Genetic testing for the familial pathogenic variant was recommended but pt's daughter declined.   Follow up 3 months for repeat Amvuttra injection.   Audry Riles, PharmD, BCPS, BCCP, CPP Heart Failure Clinic Pharmacist [phone]

## 2022-08-12 NOTE — Patient Instructions (Signed)
It was a pleasure seeing you today!  MEDICATIONS: -We are changing your medications today -Start vitamin A supplement (available over the counter). Take 8000 IU (2400 mcg) daily. -Call if you have questions about your medications.    NEXT APPOINTMENT: Return to clinic in 3 months for repeat Amvuttra injection.  In general, to take care of your heart failure: -Limit your fluid intake to 2 Liters (half-gallon) per day.   -Limit your salt intake to ideally 2-3 grams (2000-3000 mg) per day. -Weigh yourself daily and record, and bring that "weight diary" to your next appointment.  (Weight gain of 2-3 pounds in 1 day typically means fluid weight.) -The medications for your heart are to help your heart and help you live longer.   -Please contact us before stopping any of your heart medications.  Call the clinic at [phone] with questions or to reschedule future appointments.

## 2022-08-23 ENCOUNTER — Other Ambulatory Visit: Payer: Self-pay | Admitting: Internal Medicine

## 2022-08-25 ENCOUNTER — Other Ambulatory Visit: Payer: Self-pay | Admitting: Pharmacist Clinician (PhC)/ Clinical Pharmacy Specialist

## 2022-08-25 MED ORDER — MOUNJARO 5 MG/0.5ML ~~LOC~~ SOAJ: 5.0000 mg | SUBCUTANEOUS | 0 refills | Status: DC

## 2022-08-25 MED ORDER — MOUNJARO 7.5 MG/0.5ML ~~LOC~~ SOAJ: 7.5000 mg | SUBCUTANEOUS | 0 refills | Status: DC

## 2022-08-25 NOTE — Telephone Encounter (Signed)
Spoke with patient, she has done well with the 2.5 mg dose and is ready to increase to 5.  Explained that I would send rx to pharmacy for 5 and 7.5 mg doses and she can call if would like to stay at 5 mg longer than just 4 weeks.   Patient voiced understanding

## 2022-08-27 ENCOUNTER — Other Ambulatory Visit: Payer: Self-pay

## 2022-08-27 ENCOUNTER — Other Ambulatory Visit (HOSPITAL_COMMUNITY): Payer: Self-pay | Admitting: Cardiology

## 2022-08-27 ENCOUNTER — Other Ambulatory Visit (HOSPITAL_COMMUNITY): Payer: Self-pay

## 2022-08-27 MED ORDER — VYNDAQEL 20 MG PO CAPS
80.0000 mg | ORAL_CAPSULE | Freq: Every day | ORAL | 11 refills | Status: DC
  Filled 2022-08-27: qty 120, 30d supply, fill #0
  Filled 2022-09-22: qty 120, 30d supply, fill #1
  Filled 2022-10-21: qty 120, 30d supply, fill #2
  Filled 2022-11-18: qty 120, 30d supply, fill #3
  Filled 2022-12-10: qty 120, 30d supply, fill #4
  Filled 2023-01-13: qty 120, 30d supply, fill #5
  Filled 2023-02-03: qty 120, 30d supply, fill #6
  Filled 2023-03-13: qty 120, 30d supply, fill #7
  Filled 2023-04-02: qty 120, 30d supply, fill #8
  Filled 2023-05-01: qty 120, 30d supply, fill #9
  Filled 2023-06-02: qty 120, 30d supply, fill #10
  Filled 2023-07-03: qty 120, 30d supply, fill #11

## 2022-08-28 ENCOUNTER — Other Ambulatory Visit: Payer: Self-pay

## 2022-09-01 ENCOUNTER — Other Ambulatory Visit: Payer: Self-pay

## 2022-09-19 ENCOUNTER — Other Ambulatory Visit: Payer: Self-pay | Admitting: Internal Medicine

## 2022-09-22 ENCOUNTER — Other Ambulatory Visit (HOSPITAL_COMMUNITY): Payer: Self-pay

## 2022-09-26 ENCOUNTER — Other Ambulatory Visit: Payer: Self-pay

## 2022-09-29 ENCOUNTER — Telehealth: Payer: Self-pay | Admitting: Internal Medicine

## 2022-09-29 NOTE — Telephone Encounter (Signed)
Confirmed with pharmacy patient had picked up the next step dose Mounjaro 7.5 mg weekly

## 2022-09-29 NOTE — Telephone Encounter (Signed)
Patient reports she has not noticed any change in her appetite. Her BG has been improving can not recall numbers.  Will be starting Mounjaro 7.5 mg weekly dose this week.

## 2022-09-29 NOTE — Telephone Encounter (Signed)
Patient returned Pharmacist's call.

## 2022-10-07 ENCOUNTER — Other Ambulatory Visit (HOSPITAL_COMMUNITY): Payer: Self-pay | Admitting: Cardiology

## 2022-10-11 ENCOUNTER — Other Ambulatory Visit (HOSPITAL_COMMUNITY): Payer: Self-pay | Admitting: Family Medicine

## 2022-10-16 NOTE — Progress Notes (Signed)
Date:  10/20/2022   ID:  Kimberly [name], DOB 1955/05/22, MRN [id number]   Provider location: [place] [facility] Type of Visit: Established patient   PCP:  Darryl Lent, PA-C  HF Cardiologist:  Dr. Shirlee Latch   History of Present Illness: Kimberly [name] is a 67 y.o. female who has a history of paroxysmal atrial fibrillation, obesity, and nonischemic cardiomyopathy.  She was admitted in 3/15 at [facility] with atrial fibrillation and CHF.  TEE was done in preparation for DCCV, showing EF 25-30% but there was LA thrombus so DCCV was not done.  Plan was for anticoagulation x 3 months followed by TEE-guided DCCV in the OR (due to patient's body habitus).  LHC was also done, showing mild nonobstructive CAD.  Patient was diuresed with IV Lasix in the hospital and started on apixaban.    Patient had TEE again in 6/15, showing EF 35-40% with diffuse hypokinesis and no LAA thrombus.  She was cardioverted to NSR (with difficulty).  I started her at that time on amiodarone.  She then went back into atrial fibrillation.  After she had been loaded on amiodarone, cardioverted her again in 7/15 to NSR.     She had gone back into atrial fibrillation in 1/18 and amiodarone was increased to bid in preparation for DCCV. However, she spontaneously converted to NSR.   PYP scan in 4/19 was grade 3, strongly suggestive of TTR amyloidosis.  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  Echo 8/19 showed EF 60%, moderate LVH, mild aortic stenosis.  Of note, she has carpal tunnel syndrome.  She was started on tafamidis.    She went back into atrial fibrillation and had DCCV in 2/20 but was back in atrial fibrillation a few weeks later when she went to atrial fibrillation clinic.  Dr. Johney Frame thought that morbid obesity precluded atrial fibrillation ablation and concerns about compliance led them not to start her on Tikosyn.  Instead, it was recommended that she start on CPAP for her  OSA, lose weight, and re-attempt DCCV while on amiodarone.   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  In 7/21, she developed hematochezia and had EGD/colonoscopy which were unrevealing except for internal hemorrhoids.   Echo in 10/22 showed EF 60-65%, normal RV, moderate AS mean gradient 20 mmHg with AVA 1.56 cm^2, mild AI.   Echo 04/08/22  EF 55-60%, mild LVH, mildly decreased RV systolic function with normal RV size, PASP 47 mmHg, paradoxical low flow/low gradient severe AS (mean 25 mmHg, AVA 0.6 cm^2).   Admitted after TEE/LHC/RHC in 11/23 for aortic stenosis workup. TEE showed an abnormal trileaflet aortic valve with doming of the right coronary cusp. There was severe aortic stenosis with moderate aortic insufficiency. LHC showed nonobstructive CAD. RHC showed elevated right and left heart filling pressures with pulmonary venous hypertension. Admitted with acute HFpEF. Diuresed well with IV lasix, weight down 16 lbs during admission. Home diuretics regimen adjusted with torsemide increased to 80 mg daily. She was referred to structural heart team for possible TAVR.   She saw [facility] who thought that she would be best-suited for TAVR.  She saw Dr Lynnette Caffey who was concerned about the position of her RCA and worried about disruption of the ostium of the vessel with TAVR.  He recommended weight loss and SAVR.  She agreed to start [facility]. Echo was repeated in 2/24,  showing EF 55-60%, mild LVH, mild RV dysfunction, severe MR with AVA 0.8 cm^2 and mean gradient 33 mmHg.  Today she returns for HF follow up. Overall feeling fine. She is not SOB walking on flat ground with her cane. No SOB with ADLs. She continues with occasional dizziness, worse with standing. Denies  CP, palpitations, edema, or PND/Orthopnea. She has mild hemorrhoidal bleeding. Appetite ok. No fever or chills. Weight at home 234 pounds. Taking all  medications. .  ECG (personally reviewed): none ordered today.  Labs (4/19): LFTs normal, K 4, creatinine 1.24, TSH normal, immunofixation normal Labs (8/19): TSH normal, K 3.8, creatinine 1.27, LFTs normal, TSH normal Labs (11/19): K 3.8, creatinine 1.21 Labs (2/20): LDL 114, hgb 10.5 Labs (3/20): K 4.4, creatinine 1.42 Labs (4/20): K 4.2, creatinine 1.23 Labs (7/21): K 3.8, creatinine 1.23 Labs (8/21): hgb 11.2 Labs (10/21): creatinine 2.1 Labs (5/22): K 3.5, creatinine 1.46 Labs (8/22): K 3.4, creatinine 1.26 Labs (1/23): BNP 127, K 4, creatinine 1.4, LDL 112 Labs (4/23): Scr 1.3, K 3.7, LDL 96  Labs (7/23): BNP 110 Labs (11/23): K 3.7, creatinine 1.3  Labs (1/24): K 3.7, creatinine 1.29, hgb 11.2, BNP 154   PMH: 1. Obese 2. Atrial fibrillation: Paroxysmal => chronic.  Unable to cardiovert in 3/15 due to LAA thrombus noted on TEE. TEE in 6/15 without LAA thrombus.  Patient had cardioversion to NSR with difficulty but back in atrial fibrillation by 7/15 appt.  She was started on amiodarone and cardioverted in 7/15.   - DCCV in 2/20 but back in atrial fibrillation by 3/20, has been in atrial fibrillation since that time.  3. H/o TKR 4. Nonischemic cardiomyopathy: TEE (3/15) with EF 25-30%, moderate MR, PA systolic pressure 51 mmHg, moderate to severe TR, LA thrombus was noted.   LHC (3/15) with 50% stenosis small PLV.  TEE (6/15) with EF 35-40%, diffuse hypokinesis, mild MR, moderate TR, +PFO, no LA appendage thrombus.  - Echo (10/15): with EF 60-65%.   - Echo (1/18): EF 60-65% Mild AS - Echo (8/19): EF 60%, moderate LVH, mild aortic stenosis.  - PYP scan in 4/19 was grade 3, strongly suggestive of transthyretin amyloidosis.  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  - Echo (11/21): EF 55-60%, mild LV dilation, RV normal, mild-moderate AS with mean gradient 17 mmHg and AVA 1.07 cm^2.  - Echo (10/22): EF 60-65%, normal RV, moderate AS mean gradient 20 mmHg with AVA 1.56 cm^2, mild AI.   - Echo (11/23): EF 55-60%, mild LVH, mildly decreased RV systolic function with normal RV size, PASP 47 mmHg, paradoxical low flow/low gradient severe AS (mean 25 mmHg, AVA 0.6 cm^2). - TEE 04/14/22: EF 55-60%, RV okay, moderate LAE, moderate AI, severe AS with mean gradient of 40 mmHg, AVA by VTI 0.86 cm2 and 0.71 cm2 by planimetry - RHC (11/23): mean RA 16, PA 49/31 mean 37, mean PCWP 26, CI 2.47, PVR 1.7 WU.  - Echo (2/24): EF 55-60%, mild LVH, mild RV dysfunction, severe MR with AVA 0.8 cm^2 and mean gradient 33 mmHg. 5. CAD: Nonobstructive.  LHC (3/15) with 50% stenosis small PLV. - LHC 04/14/22: 40% stenosis distal LAD. 6. PFO 7. Left Breast mass: Was taken for lumpectomy in 1/18 but mass had resolved.  8. Aortic stenosis: Moderate on 10/22 echo.  - Paradoxical low flow/low gradient severe AS on 11/23 echo.  9. Carpal tunnel syndrome.  10. Vestibular schwannoma s/p gamma knife surgery at [facility].  11. OSA: Not currently using CPAP.  12. Hematochezia: C-scope/EGD in 7/21 showed only internal hemorrhoids.  13. 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   14. Aortic stenosis: Severe AS by TEE in 11/23 and echo in 2/24.   Current Outpatient Medications  Medication Sig Dispense Refill   acetaminophen (TYLENOL) 650 MG CR tablet Take 1,300 mg by mouth every 8 (eight) hours as needed for pain.      apixaban (ELIQUIS) 5 MG TABS tablet TAKE 1 TABLET BY MOUTH TWICE A DAY 60 tablet 11   benzonatate (TESSALON) 100 MG capsule Take 100 mg by mouth 3 (three) times daily as needed.     Cholecalciferol (VITAMIN D-3) 5000 UNITS TABS Take 5,000 Units by mouth daily.     Docusate Sodium (DSS) 100 MG CAPS Take by mouth as needed.     Ferrous Sulfate 27 MG TABS Take 27 mg by mouth 2 (two) times daily.      fluticasone furoate-vilanterol (BREO ELLIPTA) 100-25 MCG/INH AEPB Inhale 1 puff into the lungs every morning. As needed     JARDIANCE 10 MG TABS tablet TAKE 1 TABLET BY MOUTH EVERY DAY BEFORE  BREAKFAST 90 tablet 3   ketoconazole (NIZORAL) 2 % cream Apply 1 application topically as needed.     metoprolol succinate (TOPROL-XL) 100 MG 24 hr tablet TAKE 1 TABLET BY MOUTH TWICE A DAY 180 tablet 3   nitroGLYCERIN (NITROSTAT) 0.4 MG SL tablet Place 1 tablet (0.4 mg total) under the tongue every 5 (five) minutes as needed for chest pain. 30 tablet 1   Omega-3 1000 MG CAPS Take 1,000 mg by mouth every morning.     potassium chloride (KLOR-CON) 10 MEQ tablet TAKE 5 TABLETS (50 MEQ TOTAL) BY MOUTH DAILY. 120 tablet 3   RESTASIS 0.05 % ophthalmic emulsion Place 1 drop into both eyes daily as needed.     rosuvastatin (CRESTOR) 40 MG tablet Take 1 tablet (40 mg total) by mouth at bedtime. 90 tablet 3   Tafamidis Meglumine, Cardiac, (VYNDAQEL) 20 MG CAPS Take 4 capsules (80 mg)  by mouth daily. 120 capsule 11   tirzepatide (MOUNJARO) 2.5 MG/0.5ML Pen Inject 2.5 mg into the skin once a week. 2 mL 0   tirzepatide (MOUNJARO) 7.5 MG/0.5ML Pen Inject 7.5 mg into the skin once a week. 2 mL 0   torsemide (DEMADEX) 20 MG tablet Take 4 tablets (80 mg total) by mouth daily. 180 tablet 3   Vitamin A 2400 MCG (8000 UT) CAPS Take 1 tablet by mouth daily.     vutrisiran sodium (AMVUTTRA) 25 MG/0.5ML syringe Inject 0.5 mLs (25 mg total) into the skin every 3 (three) months. 0.5 mL 3   No current facility-administered medications for this encounter.    Allergies:   Tramadol and Spironolactone   Social History:  The patient  reports that she has never smoked. She has never used smokeless tobacco. She reports that she does not drink alcohol and does not use drugs.   Family History:  The patient's family history includes CVA in her mother.   ROS:  Please see the history of present illness.   All other systems are personally reviewed and negative.   Vitals:   BP 116/70   Pulse 77   Wt (!) 154.9 kg (341 lb 9.6 oz)   SpO2 99%   BMI 55.14 kg/m   Wt Readings from Last 3 Encounters:  10/20/22 (!) 154.9 kg  (341 lb 9.6 oz)  08/04/22 (!) 158.3 kg (349 lb)  07/25/22 (!) 157.7 kg (347  lb 9.6 oz)   PHYSICAL EXAM: General:  NAD. No resp difficulty, walked into clinic with cane. HEENT: Normal Neck: Supple. No JVD. Carotids 2+ bilat; no bruits. No lymphadenopathy or thryomegaly appreciated. Cor: PMI nondisplaced. Irregular rate & rhythm. No rubs, gallops, 2/6 SEM RUSB Lungs: Clear Abdomen: Soft, obese, nontender, nondistended. No hepatosplenomegaly. No bruits or masses. Good bowel sounds. Extremities: No cyanosis, clubbing, rash, edema Neuro: Alert & oriented x 3, cranial nerves grossly intact. Moves all 4 extremities w/o difficulty. Affect pleasant.  ASSESSMENT AND PLAN: 1. 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  EF 35-40% on TEE in 6/15 but then EF back to 60-65% by 10/15 and remained normal on echo in 8/19 and in 10/22.  LHC without significant coronary disease in 3/15.  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  Stable NYHA class II-III. She is not significantly volume overloaded and weight is down 6 lbs.  - Continue torsemide 80 mg daily + KCl 50 mEq daily. BMET/BNP today. - Continue Toprol XL 100 mg bid. - Continue empagliflozin 10 mg daily. No GU symptoms. 2. 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  Atrial fibrillation and carpal tunnel syndrome, both of which she has, go along with amyloidosis.  Her history of orthostatic symptoms as well as symptoms in toes and feet consistent with peripheral neuropathy are also likely related to amyloidosis.  - Continue tafamidis.   - Continue Amvuttra.   - She was seen by  Dr. 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 Highly likely that she inherited her condition from her  mother. Given autosomal dominant condition, Shakeita's daughter is at a 50% risk of inheriting the pathogenic variant in TTR (V142I). Genetic testing for the familial pathogenic variant was recommended but pt's daughter declined.  3. Atrial fibrillation: Last DCCV in 2/20 but back in atrial fibrillation since then.  Dr. Johney Frame saw, decided against atrial fibrillation ablation due to morbid obesity.  EP decided against Tikosyn due to compliance concerns. She failed amiodarone.  She remains in atrial fibrillation, I think that the atrial fibrillation is likely permanent at this point.  Rate controlled w/ ? blocker. - Continue Toprol XL 100 mg bid.  - Continue Eliquis.   4. CAD: Mild, nonobstructive by cath 07/2013. No chest pain.  - No ASA given Eliquis use.   - Continue Crestor 40 mg daily, restart Zetia 10 mg daily.  5. Bilateral carpal tunnel symptoms: Likely related to amyloidosis.  6. OSA: She says that she cannot tolerate CPAP due to tinnitus.  7.  HTN: BP controlled.  8.  Aortic stenosis: TEE 04/14/22 showed severe AS with mean gradient 40 mmHg, AVA 0.86 by VTI and 0.71 by planimetry with moderate aortic regurgitation. Echo in 2/24 with severe AS.  The valve appeared trileaflet but the right cusp appeared to dome.  Not good TAVR candidate due to RCA position.  Seen in structural heart clinic and recommended to lose weight and proceed with SAVR after weight loss.   - Will send back to CVTS for SAVR when her weight is down.  9. Obesity: Body mass index is 55.14 kg/m.  - She is on Mounjaro.   Follow up in 3-4 months with Dr. Shirlee Latch  Signed, Jacklynn Ganong, FNP  10/20/2022  [facility] [address] [facility] [place] [address] [phone] (office) [phone] (fax)

## 2022-10-19 ENCOUNTER — Other Ambulatory Visit: Payer: Self-pay | Admitting: Internal Medicine

## 2022-10-19 DIAGNOSIS — E119 Type 2 diabetes mellitus without complications: Secondary | ICD-10-CM

## 2022-10-20 ENCOUNTER — Other Ambulatory Visit (HOSPITAL_COMMUNITY): Payer: Self-pay | Admitting: Cardiology

## 2022-10-20 ENCOUNTER — Encounter (HOSPITAL_COMMUNITY): Payer: Self-pay

## 2022-10-20 ENCOUNTER — Ambulatory Visit (HOSPITAL_COMMUNITY)
Admission: RE | Admit: 2022-10-20 | Discharge: 2022-10-20 | Disposition: A | Discharge status: Home / Self Care | Payer: Medicare HMO | Source: Ambulatory Visit | Attending: Family Medicine | Admitting: Family Medicine

## 2022-10-20 VITALS — BP 116/70 | HR 77 | Wt 341.6 lb

## 2022-10-20 DIAGNOSIS — Z8719 Personal history of other diseases of the digestive system: Secondary | ICD-10-CM | POA: Insufficient documentation

## 2022-10-20 DIAGNOSIS — I428 Other cardiomyopathies: Secondary | ICD-10-CM | POA: Diagnosis not present

## 2022-10-20 DIAGNOSIS — I43 Cardiomyopathy in diseases classified elsewhere: Secondary | ICD-10-CM | POA: Insufficient documentation

## 2022-10-20 DIAGNOSIS — I350 Nonrheumatic aortic (valve) stenosis: Secondary | ICD-10-CM

## 2022-10-20 DIAGNOSIS — I5042 Chronic combined systolic (congestive) and diastolic (congestive) heart failure: Secondary | ICD-10-CM | POA: Insufficient documentation

## 2022-10-20 DIAGNOSIS — Z86018 Personal history of other benign neoplasm: Secondary | ICD-10-CM | POA: Diagnosis not present

## 2022-10-20 DIAGNOSIS — R42 Dizziness and giddiness: Secondary | ICD-10-CM | POA: Diagnosis not present

## 2022-10-20 DIAGNOSIS — I272 Pulmonary hypertension, unspecified: Secondary | ICD-10-CM | POA: Insufficient documentation

## 2022-10-20 DIAGNOSIS — Z79899 Other long term (current) drug therapy: Secondary | ICD-10-CM | POA: Diagnosis not present

## 2022-10-20 DIAGNOSIS — K921 Melena: Secondary | ICD-10-CM | POA: Insufficient documentation

## 2022-10-20 DIAGNOSIS — G5603 Carpal tunnel syndrome, bilateral upper limbs: Secondary | ICD-10-CM | POA: Diagnosis not present

## 2022-10-20 DIAGNOSIS — I2510 Atherosclerotic heart disease of native coronary artery without angina pectoris: Secondary | ICD-10-CM

## 2022-10-20 DIAGNOSIS — I4891 Unspecified atrial fibrillation: Secondary | ICD-10-CM

## 2022-10-20 DIAGNOSIS — I480 Paroxysmal atrial fibrillation: Secondary | ICD-10-CM | POA: Insufficient documentation

## 2022-10-20 DIAGNOSIS — I082 Rheumatic disorders of both aortic and tricuspid valves: Secondary | ICD-10-CM | POA: Insufficient documentation

## 2022-10-20 DIAGNOSIS — Z6841 Body Mass Index (BMI) 40.0 and over, adult: Secondary | ICD-10-CM | POA: Insufficient documentation

## 2022-10-20 DIAGNOSIS — I10 Essential (primary) hypertension: Secondary | ICD-10-CM

## 2022-10-20 DIAGNOSIS — I5032 Chronic diastolic (congestive) heart failure: Secondary | ICD-10-CM | POA: Diagnosis not present

## 2022-10-20 DIAGNOSIS — E854 Organ-limited amyloidosis: Secondary | ICD-10-CM | POA: Diagnosis not present

## 2022-10-20 DIAGNOSIS — Z7901 Long term (current) use of anticoagulants: Secondary | ICD-10-CM | POA: Insufficient documentation

## 2022-10-20 DIAGNOSIS — I110 Hypertensive heart disease with heart failure: Secondary | ICD-10-CM | POA: Diagnosis present

## 2022-10-20 DIAGNOSIS — G4733 Obstructive sleep apnea (adult) (pediatric): Secondary | ICD-10-CM

## 2022-10-20 LAB — BASIC METABOLIC PANEL
Anion gap: 8 (ref 5–15)
BUN: 25 mg/dL — ABNORMAL HIGH (ref 8–23)
CO2: 29 mmol/L (ref 22–32)
Calcium: 9.3 mg/dL (ref 8.9–10.3)
Chloride: 103 mmol/L (ref 98–111)
Creatinine, Ser: 1.28 mg/dL — ABNORMAL HIGH (ref 0.44–1.00)
GFR, Estimated: 46 mL/min — ABNORMAL LOW (ref >60)
Glucose, Bld: 93 mg/dL (ref 70–99)
Potassium: 3.6 mmol/L (ref 3.5–5.1)
Sodium: 140 mmol/L (ref 135–145)

## 2022-10-20 LAB — CBC
HCT: 29.8 % — ABNORMAL LOW (ref 36.0–46.0)
Hemoglobin: 9.4 g/dL — ABNORMAL LOW (ref 12.0–15.0)
MCH: 28.4 pg (ref 26.0–34.0)
MCHC: 31.5 g/dL (ref 30.0–36.0)
MCV: 90 fL (ref 80.0–100.0)
Platelets: 230 10*3/uL (ref 150–400)
RBC: 3.31 MIL/uL — ABNORMAL LOW (ref 3.87–5.11)
RDW: 15.7 % — ABNORMAL HIGH (ref 11.5–15.5)
WBC: 4.9 10*3/uL (ref 4.0–10.5)
nRBC: 0 % (ref 0.0–0.2)

## 2022-10-20 MED ORDER — EZETIMIBE 10 MG PO TABS: 10.0000 mg | ORAL_TABLET | Freq: Every day | ORAL | 3 refills | Status: DC

## 2022-10-20 NOTE — Patient Instructions (Addendum)
Thank you for coming in today  If you had labs drawn today, any labs that are abnormal the clinic will call you No news is good news  Medications: RESTART Zetia 10 mg daily   Follow up appointments:  Your physician recommends that you schedule a follow-up appointment in:  3-4 months With Dr. Shirlee Latch    Do the following things EVERYDAY: Weigh yourself in the morning before breakfast. Write it down and keep it in a log. Take your medicines as prescribed Eat low salt foods--Limit salt (sodium) to 2000 mg per day.  Stay as active as you can everyday Limit all fluids for the day to less than 2 liters   At the [facility], you and your health needs are our priority. As part of our continuing mission to provide you with exceptional heart care, we have created designated Provider Care Teams. These Care Teams include your primary Cardiologist (physician) and Advanced Practice Providers (APPs- Physician Assistants and Nurse Practitioners) who all work together to provide you with the care you need, when you need it.   You may see any of the following providers on your designated Care Team at your next follow up: Dr Arvilla Meres Dr Marca Ancona Dr. Marcos Eke, NP Robbie Lis, [place] [facility], [place] Brynda Peon, NP Karle Plumber, PharmD   Please be sure to bring in all your medications bottles to every appointment.    Thank you for choosing [facility]  If you have any questions or concerns before your next appointment please send [place] a message through [place] or call our office at [phone].    TO LEAVE A MESSAGE FOR THE NURSE SELECT OPTION 2, PLEASE LEAVE A MESSAGE INCLUDING: YOUR NAME DATE OF BIRTH CALL BACK NUMBER REASON FOR CALL**this is important as we prioritize the call backs  YOU WILL RECEIVE A CALL BACK THE SAME DAY AS LONG AS YOU CALL BEFORE 4:00 PM

## 2022-10-21 ENCOUNTER — Other Ambulatory Visit (HOSPITAL_COMMUNITY): Payer: Self-pay

## 2022-10-22 ENCOUNTER — Other Ambulatory Visit (HOSPITAL_COMMUNITY): Payer: Self-pay

## 2022-10-24 ENCOUNTER — Other Ambulatory Visit (HOSPITAL_COMMUNITY): Payer: Self-pay

## 2022-10-27 ENCOUNTER — Other Ambulatory Visit (HOSPITAL_COMMUNITY): Payer: Self-pay | Admitting: Cardiology

## 2022-10-31 ENCOUNTER — Telehealth: Payer: Self-pay | Admitting: Pharmacist

## 2022-10-31 MED ORDER — MOUNJARO 10 MG/0.5ML ~~LOC~~ SOAJ: 10.0000 mg | SUBCUTANEOUS | 0 refills | Status: DC

## 2022-10-31 NOTE — Telephone Encounter (Signed)
Called for Mounjaro dose titration from 7.5 mg weekly to 10 mg weekly. N/A LVM.

## 2022-10-31 NOTE — Addendum Note (Signed)
Addended by: Tylene Fantasia on: 10/31/2022 01:36 PM   Modules accepted: Orders

## 2022-10-31 NOTE — Telephone Encounter (Signed)
Patient is returning call.  °

## 2022-10-31 NOTE — Telephone Encounter (Signed)
Called patient, reports she does not check her BG. lost almost 12 lbs. Tolerates 7.5 mg weekly Mounjaro dose well. She accidentally lost the dose from the last injection did follow steps right. Patient is comfortable to move on to next dose of Mounjaro - 10 mg weekly.   Will send prescription and will follow up in 4 weeks

## 2022-11-04 ENCOUNTER — Telehealth (HOSPITAL_COMMUNITY): Payer: Self-pay | Admitting: Pharmacist

## 2022-11-04 ENCOUNTER — Other Ambulatory Visit (HOSPITAL_COMMUNITY): Payer: Self-pay

## 2022-11-04 NOTE — Telephone Encounter (Signed)
Pt returned call to confirm appt.

## 2022-11-04 NOTE — Telephone Encounter (Signed)
Patient is due for repeat injection of Amvuttra. Appointment is scheduled for 11/11/22 at 1pm. Called patient and left VM to confirm appointment date and time.   Karle Plumber, PharmD, BCPS, BCCP, CPP Heart Failure Clinic Pharmacist [phone]

## 2022-11-05 ENCOUNTER — Other Ambulatory Visit (HOSPITAL_COMMUNITY): Payer: Self-pay

## 2022-11-11 ENCOUNTER — Ambulatory Visit (HOSPITAL_COMMUNITY)
Admission: RE | Admit: 2022-11-11 | Discharge: 2022-11-11 | Disposition: A | Discharge status: Home / Self Care | Payer: Medicare HMO | Source: Ambulatory Visit | Attending: Cardiology | Admitting: Cardiology

## 2022-11-11 ENCOUNTER — Other Ambulatory Visit (HOSPITAL_COMMUNITY): Payer: Self-pay | Admitting: Pharmacist

## 2022-11-11 DIAGNOSIS — I43 Cardiomyopathy in diseases classified elsewhere: Secondary | ICD-10-CM | POA: Insufficient documentation

## 2022-11-11 DIAGNOSIS — I428 Other cardiomyopathies: Secondary | ICD-10-CM | POA: Insufficient documentation

## 2022-11-11 DIAGNOSIS — G63 Polyneuropathy in diseases classified elsewhere: Secondary | ICD-10-CM

## 2022-11-11 DIAGNOSIS — I5042 Chronic combined systolic (congestive) and diastolic (congestive) heart failure: Secondary | ICD-10-CM | POA: Insufficient documentation

## 2022-11-11 DIAGNOSIS — I480 Paroxysmal atrial fibrillation: Secondary | ICD-10-CM | POA: Insufficient documentation

## 2022-11-11 DIAGNOSIS — I5032 Chronic diastolic (congestive) heart failure: Secondary | ICD-10-CM

## 2022-11-11 DIAGNOSIS — E669 Obesity, unspecified: Secondary | ICD-10-CM | POA: Insufficient documentation

## 2022-11-11 DIAGNOSIS — E854 Organ-limited amyloidosis: Secondary | ICD-10-CM | POA: Diagnosis not present

## 2022-11-11 DIAGNOSIS — E851 Neuropathic heredofamilial amyloidosis: Secondary | ICD-10-CM

## 2022-11-11 LAB — CBC
HCT: 33.1 % — ABNORMAL LOW (ref 36.0–46.0)
Hemoglobin: 10.4 g/dL — ABNORMAL LOW (ref 12.0–15.0)
MCH: 28.7 pg (ref 26.0–34.0)
MCHC: 31.4 g/dL (ref 30.0–36.0)
MCV: 91.2 fL (ref 80.0–100.0)
Platelets: 235 10*3/uL (ref 150–400)
RBC: 3.63 MIL/uL — ABNORMAL LOW (ref 3.87–5.11)
RDW: 15.1 % (ref 11.5–15.5)
WBC: 5.1 10*3/uL (ref 4.0–10.5)
nRBC: 0 % (ref 0.0–0.2)

## 2022-11-11 MED ORDER — VUTRISIRAN SODIUM 25 MG/0.5ML ~~LOC~~ SOSY
25.0000 mg | PREFILLED_SYRINGE | Freq: Once | SUBCUTANEOUS | Status: AC
  Administered 2022-11-11: 25 mg via SUBCUTANEOUS

## 2022-11-11 NOTE — Progress Notes (Signed)
[facility] Note   PCP:  Darryl Lent, PA-C  HF Cardiologist:  Dr. Shirlee Latch  HPI:  Kimberly [name] is a 67 y.o. female who has a history of paroxysmal atrial fibrillation, obesity, and nonischemic cardiomyopathy.  She was admitted in 07/2013 at [facility] with atrial fibrillation and CHF.  TEE was done in preparation for DCCV, showing EF 25-30% but there was LA thrombus so DCCV was not done.  Plan was for anticoagulation x 3 months followed by TEE-guided DCCV in the OR (due to patient's body habitus).  LHC was also done, showing mild nonobstructive CAD.  Patient was diuresed with IV Lasix in the hospital and started on apixaban.    Patient had TEE again in 10/2013, showing EF 35-40% with diffuse hypokinesis and no LAA thrombus.  She was cardioverted to NSR (with difficulty).  Was started on amiodarone.  She then went back into atrial fibrillation.  After she had been loaded on amiodarone, cardioverted her again in 11/2013 to NSR.     She had gone back into atrial fibrillation in 05/2016 and amiodarone was increased to BID in preparation for DCCV. However, she spontaneously converted to NSR.   PYP scan in 08/2017 was grade 3, strongly suggestive of TTR amyloidosis.  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  Echo 12/2017 showed EF 60%, moderate LVH, mild aortic stenosis.  Of note, she has carpal tunnel syndrome.  She was started on tafamidis.    She went back into atrial fibrillation and had DCCV in 06/2018 but was back in atrial fibrillation a few weeks later when she went to atrial fibrillation clinic.  Dr. Johney Frame thought that morbid obesity precluded atrial fibrillation ablation and concerns about compliance led them not to start her on Tikosyn.  Instead, it was recommended that she start on CPAP for her OSA, lose weight, and re-attempt DCCV while on amiodarone.    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  In 7/21, she developed hematochezia and had EGD/colonoscopy which were unrevealing except for internal hemorrhoids.    Echo in 10/22 showed EF 60-65%, normal RV, moderate AS mean gradient 20 mmHg with AVA 1.56 cm^2, mild AI.    Echo 04/08/22  EF 55-60%, mild LVH, mildly decreased RV systolic function with normal RV size, PASP 47 mmHg, paradoxical low flow/low gradient severe AS (mean 25 mmHg, AVA 0.6 cm^2).    Admitted after TEE/LHC/RHC in 11/23 for aortic stenosis workup. TEE showed an abnormal trileaflet aortic valve with doming of the right coronary cusp. There was severe aortic stenosis with moderate aortic insufficiency. LHC showed nonobstructive CAD. RHC showed elevated right and left heart filling pressures with pulmonary venous hypertension. Admitted with acute HFpEF. Diuresed well with IV Lasix, weight down 16 lbs during admission. Home diuretics regimen adjusted with torsemide increased to 80 mg daily. She was referred to structural heart team for possible TAVR.    She saw [facility] who thought that she would be best-suited for TAVR.  She saw Dr Lynnette Caffey who was concerned about the position of her RCA and worried about disruption of the ostium of the vessel with TAVR.  He recommended weight loss and SAVR.  She agreed to start [facility]. Echo was repeated in 2/24, showing EF 55-60%, mild LVH, mild RV dysfunction, severe MR with AVA 0.8 cm^2 and mean gradient 33 mmHg.  Today she returns to HF clinic for  repeat injection of Amvuttra. Overall feeling ok today. Says she had "nerve" test recently and has follow up with the physician mid-July. No contraindications to injection today. Injection administered in R arm.    Assessment/Plan: 1. 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  EF 35-40% on TEE in 10/2013 but then EF back to 60-65% by 02/2014 and remained normal on echo in 12/2017 and in 02/2021.  LHC without significant coronary disease in  07/2013.  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   - Stable NYHA class II-III.  - Continue torsemide 80 mg daily + KCl 50 mEq daily. - Continue Toprol XL 100 mg BID. - Continue empagliflozin 10 mg daily.  2. 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  Atrial fibrillation and carpal tunnel syndrome, both of which she has, go along with amyloidosis.  Her history of orthostatic symptoms as well as symptoms in toes and feet consistent with peripheral neuropathy are also likely related to amyloidosis.  - Continue tafamidis.   - Amvuttra (vutrisiran) injection administered in clinic today. Patient tolerated injection well. Provided patient counseling on Amvuttra. Most common side effects are injection site reactions, arthralgias and dyspnea. Patient is aware to return to clinic every 3 months for repeat injection. Amvuttra will be obtained from [facility] and couriered to clinic for injection.  - Continue vitamin A supplement 8000 IU daily. Amvuttra decreases serum vitamin A levels. - She was seen by  Dr. 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 Given autosomal dominant condition, Neosha's daughter is at a 50% risk of inheriting the pathogenic variant in TTR (V142I). Genetic testing for the familial pathogenic variant was recommended but pt's daughter declined.   Follow up 3 months for repeat Amvuttra injection.   Karle Plumber, PharmD, BCPS, BCCP, CPP Heart Failure Clinic Pharmacist [phone]

## 2022-11-11 NOTE — Patient Instructions (Signed)
It was a pleasure seeing you today!  MEDICATIONS: -No medication changes today -Call if you have questions about your medications.  LABS: -We will call you if your labs need attention.  NEXT APPOINTMENT: Return to clinic in 3 months for repeat Amvuttra injection.  In general, to take care of your heart failure: -Limit your fluid intake to 2 Liters (half-gallon) per day.   -Limit your salt intake to ideally 2-3 grams (2000-3000 mg) per day. -Weigh yourself daily and record, and bring that "weight diary" to your next appointment.  (Weight gain of 2-3 pounds in 1 day typically means fluid weight.) -The medications for your heart are to help your heart and help you live longer.   -Please contact us before stopping any of your heart medications.  Call the clinic at [phone] with questions or to reschedule future appointments.

## 2022-11-17 ENCOUNTER — Other Ambulatory Visit (HOSPITAL_COMMUNITY): Payer: Self-pay | Admitting: Family Medicine

## 2022-11-18 ENCOUNTER — Other Ambulatory Visit (HOSPITAL_COMMUNITY): Payer: Self-pay

## 2022-11-21 ENCOUNTER — Other Ambulatory Visit: Payer: Self-pay

## 2022-11-25 ENCOUNTER — Other Ambulatory Visit (HOSPITAL_COMMUNITY): Payer: Self-pay

## 2022-11-30 ENCOUNTER — Other Ambulatory Visit: Payer: Self-pay | Admitting: Internal Medicine

## 2022-12-03 ENCOUNTER — Telehealth: Payer: Self-pay | Admitting: Pharmacist

## 2022-12-03 NOTE — Telephone Encounter (Signed)
Due for Mounjaro dose titration from 10 mg once week to 12.5 mg once week. Called N/A LVM.

## 2022-12-05 MED ORDER — MOUNJARO 12.5 MG/0.5ML ~~LOC~~ SOAJ: 12.5000 mg | SUBCUTANEOUS | 0 refills | Status: DC

## 2022-12-05 NOTE — Telephone Encounter (Signed)
Patient called back, reported she is down to 327 lbs her baseline weight was 341 lbs. She does not check her BG at home. No episode of hypoglycemia.  No S/E from Mounjaro 10 mg weekly dose will titrate to 12.5 mg weekly.

## 2022-12-05 NOTE — Telephone Encounter (Signed)
Pt returning Pharmacist call from 7/17. Call tranferred

## 2022-12-10 ENCOUNTER — Other Ambulatory Visit (HOSPITAL_COMMUNITY): Payer: Self-pay

## 2022-12-16 ENCOUNTER — Other Ambulatory Visit: Payer: Self-pay

## 2023-01-01 ENCOUNTER — Other Ambulatory Visit (HOSPITAL_COMMUNITY): Payer: Self-pay

## 2023-01-01 ENCOUNTER — Telehealth: Payer: Self-pay | Admitting: Pharmacist

## 2023-01-01 ENCOUNTER — Other Ambulatory Visit: Payer: Self-pay

## 2023-01-01 MED ORDER — MOUNJARO 12.5 MG/0.5ML ~~LOC~~ SOAJ
12.5000 mg | SUBCUTANEOUS | 1 refills | Status: DC
  Filled 2023-01-01: qty 2, 28d supply, fill #0

## 2023-01-01 NOTE — Telephone Encounter (Signed)
-----   Message from Nurse Leotis Shames B sent at 12/31/2022  1:45 PM EDT ----- Regarding: Kimberly [name],  This patient is having issues getting her medication from her pharmacy.  She said it has been 2 weeks since she had her medicine due to the pharmacy not having it in stock.  Can you assist the patient with this issue?  Thank you,  Leotis Shames

## 2023-01-01 NOTE — Telephone Encounter (Signed)
LVM to call back.  [facility] has Mounjaro 12.5 mg in stock will send prescription to [facility] location.

## 2023-01-08 ENCOUNTER — Other Ambulatory Visit (HOSPITAL_COMMUNITY): Payer: Self-pay | Admitting: Cardiology

## 2023-01-08 NOTE — Telephone Encounter (Signed)
This is a CHF pt 

## 2023-01-13 ENCOUNTER — Other Ambulatory Visit (HOSPITAL_COMMUNITY): Payer: Self-pay

## 2023-01-14 ENCOUNTER — Other Ambulatory Visit (HOSPITAL_COMMUNITY): Payer: Self-pay

## 2023-01-20 ENCOUNTER — Encounter (HOSPITAL_COMMUNITY): Payer: Self-pay | Admitting: Cardiology

## 2023-01-20 ENCOUNTER — Ambulatory Visit (HOSPITAL_COMMUNITY)
Admission: RE | Admit: 2023-01-20 | Discharge: 2023-01-20 | Disposition: A | Discharge status: Home / Self Care | Payer: Medicare HMO | Source: Ambulatory Visit | Attending: Cardiology | Admitting: Cardiology

## 2023-01-20 VITALS — BP 110/70 | HR 90 | Wt 329.0 lb

## 2023-01-20 DIAGNOSIS — I428 Other cardiomyopathies: Secondary | ICD-10-CM | POA: Insufficient documentation

## 2023-01-20 DIAGNOSIS — I2510 Atherosclerotic heart disease of native coronary artery without angina pectoris: Secondary | ICD-10-CM | POA: Insufficient documentation

## 2023-01-20 DIAGNOSIS — Z7901 Long term (current) use of anticoagulants: Secondary | ICD-10-CM | POA: Insufficient documentation

## 2023-01-20 DIAGNOSIS — G5603 Carpal tunnel syndrome, bilateral upper limbs: Secondary | ICD-10-CM | POA: Insufficient documentation

## 2023-01-20 DIAGNOSIS — Z79899 Other long term (current) drug therapy: Secondary | ICD-10-CM | POA: Diagnosis not present

## 2023-01-20 DIAGNOSIS — G629 Polyneuropathy, unspecified: Secondary | ICD-10-CM | POA: Insufficient documentation

## 2023-01-20 DIAGNOSIS — I5032 Chronic diastolic (congestive) heart failure: Secondary | ICD-10-CM

## 2023-01-20 DIAGNOSIS — I350 Nonrheumatic aortic (valve) stenosis: Secondary | ICD-10-CM | POA: Diagnosis not present

## 2023-01-20 DIAGNOSIS — I110 Hypertensive heart disease with heart failure: Secondary | ICD-10-CM | POA: Insufficient documentation

## 2023-01-20 DIAGNOSIS — Z6841 Body Mass Index (BMI) 40.0 and over, adult: Secondary | ICD-10-CM | POA: Insufficient documentation

## 2023-01-20 DIAGNOSIS — I480 Paroxysmal atrial fibrillation: Secondary | ICD-10-CM | POA: Insufficient documentation

## 2023-01-20 DIAGNOSIS — I5022 Chronic systolic (congestive) heart failure: Secondary | ICD-10-CM | POA: Insufficient documentation

## 2023-01-20 DIAGNOSIS — E854 Organ-limited amyloidosis: Secondary | ICD-10-CM | POA: Diagnosis not present

## 2023-01-20 DIAGNOSIS — G4733 Obstructive sleep apnea (adult) (pediatric): Secondary | ICD-10-CM | POA: Diagnosis not present

## 2023-01-20 DIAGNOSIS — I43 Cardiomyopathy in diseases classified elsewhere: Secondary | ICD-10-CM | POA: Insufficient documentation

## 2023-01-20 LAB — BASIC METABOLIC PANEL
Anion gap: 16 — ABNORMAL HIGH (ref 5–15)
BUN: 19 mg/dL (ref 8–23)
CO2: 29 mmol/L (ref 22–32)
Calcium: 9.6 mg/dL (ref 8.9–10.3)
Chloride: 97 mmol/L — ABNORMAL LOW (ref 98–111)
Creatinine, Ser: 1.15 mg/dL — ABNORMAL HIGH (ref 0.44–1.00)
GFR, Estimated: 53 mL/min — ABNORMAL LOW (ref >60)
Glucose, Bld: 80 mg/dL (ref 70–99)
Potassium: 3.4 mmol/L — ABNORMAL LOW (ref 3.5–5.1)
Sodium: 142 mmol/L (ref 135–145)

## 2023-01-20 LAB — BRAIN NATRIURETIC PEPTIDE: B Natriuretic Peptide: 116.9 pg/mL — ABNORMAL HIGH (ref 0.0–100.0)

## 2023-01-20 NOTE — Patient Instructions (Signed)
No changes to medications continue as before  Labs done today, your results will be available in MyChart, we will contact you for abnormal readings.  Your physician recommends that you schedule a follow-up appointment in: 3 months as scheduled  If you have any questions or concerns before your next appointment please send [place] a message through [place] or call our office at [phone].    TO LEAVE A MESSAGE FOR THE NURSE SELECT OPTION 2, PLEASE LEAVE A MESSAGE INCLUDING: YOUR NAME DATE OF BIRTH CALL BACK NUMBER REASON FOR CALL**this is important as we prioritize the call backs  YOU WILL RECEIVE A CALL BACK THE SAME DAY AS LONG AS YOU CALL BEFORE 4:00 PM  At the [facility], you and your health needs are our priority. As part of our continuing mission to provide you with exceptional heart care, we have created designated Provider Care Teams. These Care Teams include your primary Cardiologist (physician) and Advanced Practice Providers (APPs- Physician Assistants and Nurse Practitioners) who all work together to provide you with the care you need, when you need it.   You may see any of the following providers on your designated Care Team at your next follow up: Dr Arvilla Meres Dr Marca Ancona Dr. Marcos Eke, NP Robbie Lis, [place] [facility] [place], [place] Brynda Peon, NP Karle Plumber, PharmD   Please be sure to bring in all your medications bottles to every appointment.    Thank you for choosing [facility]

## 2023-01-21 ENCOUNTER — Telehealth (HOSPITAL_COMMUNITY): Payer: Self-pay

## 2023-01-21 DIAGNOSIS — I5032 Chronic diastolic (congestive) heart failure: Secondary | ICD-10-CM

## 2023-01-21 MED ORDER — POTASSIUM CHLORIDE ER 10 MEQ PO TBCR: 70.0000 meq | EXTENDED_RELEASE_TABLET | Freq: Every day | ORAL | 2 refills | Status: DC

## 2023-01-21 NOTE — Progress Notes (Signed)
Date:  01/21/2023   ID:  Kimberly [name], DOB March 18, 1956, MRN [id number]   Provider location:  [facility] Type of Visit: Established patient   PCP:  Darryl Lent, PA-C  HF Cardiologist:  Dr. Shirlee Latch   History of Present Illness: Kimberly [name] is a 67 y.o. female who has a history of paroxysmal atrial fibrillation, obesity, and nonischemic cardiomyopathy.  She was admitted in 3/15 at [facility] with atrial fibrillation and CHF.  TEE was done in preparation for DCCV, showing EF 25-30% but there was LA thrombus so DCCV was not done.  Plan was for anticoagulation x 3 months followed by TEE-guided DCCV in the OR (due to patient's body habitus).  LHC was also done, showing mild nonobstructive CAD.  Patient was diuresed with IV Lasix in the hospital and started on apixaban.    Patient had TEE again in 6/15, showing EF 35-40% with diffuse hypokinesis and no LAA thrombus.  She was cardioverted to NSR (with difficulty).  I started her at that time on amiodarone.  She then went back into atrial fibrillation.  After she had been loaded on amiodarone, cardioverted her again in 7/15 to NSR.     She had gone back into atrial fibrillation in 1/18 and amiodarone was increased to bid in preparation for DCCV. However, she spontaneously converted to NSR.   PYP scan in 4/19 was grade 3, strongly suggestive of TTR amyloidosis.  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  Echo 8/19 showed EF 60%, moderate LVH, mild aortic stenosis.  Of note, she has carpal tunnel syndrome.  She was started on tafamidis.    She went back into atrial fibrillation and had DCCV in 2/20 but was back in atrial fibrillation a few weeks later when she went to atrial fibrillation clinic.  Dr. Johney Frame thought that morbid obesity precluded atrial fibrillation ablation and concerns about compliance led them not to start her on Tikosyn.  Instead, it was recommended that she start on CPAP for her  OSA, lose weight, and re-attempt DCCV while on amiodarone.   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  In 7/21, she developed hematochezia and had EGD/colonoscopy which were unrevealing except for internal hemorrhoids.   Echo in 10/22 showed EF 60-65%, normal RV, moderate AS mean gradient 20 mmHg with AVA 1.56 cm^2, mild AI.   Echo 04/08/22  EF 55-60%, mild LVH, mildly decreased RV systolic function with normal RV size, PASP 47 mmHg, paradoxical low flow/low gradient severe AS (mean 25 mmHg, AVA 0.6 cm^2).   Admitted after TEE/LHC/RHC in 11/23 for aortic stenosis workup. TEE showed an abnormal trileaflet aortic valve with doming of the right coronary cusp. There was severe aortic stenosis with moderate aortic insufficiency. LHC showed nonobstructive CAD. RHC showed elevated right and left heart filling pressures with pulmonary venous hypertension. Admitted with acute HFpEF. Diuresed well with IV lasix, weight down 16 lbs during admission. Home diuretics regimen adjusted with torsemide increased to 80 mg daily. She was referred to structural heart team for possible TAVR.   She saw Dr. Delia Chimes who thought that she would be best-suited for TAVR.  She saw Dr Lynnette Caffey who was concerned about the position of her RCA and worried about disruption of the ostium of the vessel with TAVR.  He recommended weight loss and SAVR.  She agreed to start [facility]. Echo was repeated in  2/24, showing EF 55-60%, mild LVH, mild RV dysfunction, severe AS with AVA 0.8 cm^2 and mean gradient 33 mmHg, mild-moderate AI.  Today she returns for HF follow up. Weight is trending down, she has lost another 12 lbs.  She has occasional episodes of positional vertigo and is going to go back to PT for this (h/o vestibular schwannoma).  Breathing is better with weight loss.  Walking with cane, no dyspnea on flat ground.  She is short of breath walking up  hills/stairs.  Still with tingling in hands and feet from peripheral neuropathy. She continues on vutrisiran.    Labs (4/19): LFTs normal, K 4, creatinine 1.24, TSH normal, immunofixation normal Labs (8/19): TSH normal, K 3.8, creatinine 1.27, LFTs normal, TSH normal Labs (11/19): K 3.8, creatinine 1.21 Labs (2/20): LDL 114, hgb 10.5 Labs (3/20): K 4.4, creatinine 1.42 Labs (4/20): K 4.2, creatinine 1.23 Labs (7/21): K 3.8, creatinine 1.23 Labs (8/21): hgb 11.2 Labs (10/21): creatinine 2.1 Labs (5/22): K 3.5, creatinine 1.46 Labs (8/22): K 3.4, creatinine 1.26 Labs (1/23): BNP 127, K 4, creatinine 1.4, LDL 112 Labs (4/23): Scr 1.3, K 3.7, LDL 96  Labs (7/23): BNP 110 Labs (11/23): K 3.7, creatinine 1.3  Labs (1/24): K 3.7, creatinine 1.29, hgb 11.2, BNP 154 Labs (6/24): K 3.6, creatinine 1.28, BNP 199, hgb 10.4   PMH: 1. Obese 2. Atrial fibrillation: Paroxysmal => chronic.  Unable to cardiovert in 3/15 due to LAA thrombus noted on TEE. TEE in 6/15 without LAA thrombus.  Patient had cardioversion to NSR with difficulty but back in atrial fibrillation by 7/15 appt.  She was started on amiodarone and cardioverted in 7/15.   - DCCV in 2/20 but back in atrial fibrillation by 3/20, has been in atrial fibrillation since that time.  3. H/o TKR 4. Nonischemic cardiomyopathy: TEE (3/15) with EF 25-30%, moderate MR, PA systolic pressure 51 mmHg, moderate to severe TR, LA thrombus was noted.   LHC (3/15) with 50% stenosis small PLV.  TEE (6/15) with EF 35-40%, diffuse hypokinesis, mild MR, moderate TR, +PFO, no LA appendage thrombus.  - Echo (10/15): with EF 60-65%.   - Echo (1/18): EF 60-65% Mild AS - Echo (8/19): EF 60%, moderate LVH, mild aortic stenosis.  - PYP scan in 4/19 was grade 3, strongly suggestive of transthyretin amyloidosis.  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  - Echo (11/21): EF 55-60%, mild LV dilation, RV normal, mild-moderate AS with mean gradient 17 mmHg and AVA 1.07 cm^2.   - Echo (10/22): EF 60-65%, normal RV, moderate AS mean gradient 20 mmHg with AVA 1.56 cm^2, mild AI.  - Echo (11/23): EF 55-60%, mild LVH, mildly decreased RV systolic function with normal RV size, PASP 47 mmHg, paradoxical low flow/low gradient severe AS (mean 25 mmHg, AVA 0.6 cm^2). - TEE 04/14/22: EF 55-60%, RV okay, moderate LAE, moderate AI, severe AS with mean gradient of 40 mmHg, AVA by VTI 0.86 cm2 and 0.71 cm2 by planimetry - RHC (11/23): mean RA 16, PA 49/31 mean 37, mean PCWP 26, CI 2.47, PVR 1.7 WU.  - Echo (2/24): EF 55-60%, mild LVH, mild RV dysfunction, severe AS with AVA 0.8 cm^2 and mean gradient 33 mmHg, mild-moderate AI. 5. CAD: Nonobstructive.  LHC (3/15) with 50% stenosis small PLV. - LHC 04/14/22: 40% stenosis distal LAD. 6. PFO 7. Left Breast mass: Was taken for lumpectomy in 1/18 but mass had resolved.  8. Aortic stenosis: Moderate on 10/22 echo.  - Paradoxical low flow/low gradient severe  AS on 11/23 echo.  9. Carpal tunnel syndrome.  10. Vestibular schwannoma s/p gamma knife surgery at [facility].  11. OSA: Not currently using CPAP.  12. Hematochezia: C-scope/EGD in 7/21 showed only internal hemorrhoids.  13. 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   14. Aortic stenosis: Severe AS by TEE in 11/23 and echo in 2/24.   Current Outpatient Medications  Medication Sig Dispense Refill   acetaminophen (TYLENOL) 650 MG CR tablet Take 1,300 mg by mouth every 8 (eight) hours as needed for pain.      apixaban (ELIQUIS) 5 MG TABS tablet TAKE 1 TABLET BY MOUTH TWICE A DAY 60 tablet 11   benzonatate (TESSALON) 100 MG capsule Take 100 mg by mouth 3 (three) times daily as needed.     Cholecalciferol (VITAMIN D-3) 5000 UNITS TABS Take 5,000 Units by mouth daily.     Docusate Sodium (DSS) 100 MG CAPS Take by mouth as needed.     ezetimibe (ZETIA) 10 MG tablet TAKE 1/2 TABLET BY MOUTH DAILY 45 tablet 3   Ferrous Sulfate 27 MG TABS Take 27 mg by mouth 2 (two) times daily.       fluticasone furoate-vilanterol (BREO ELLIPTA) 100-25 MCG/INH AEPB Inhale 1 puff into the lungs every morning. As needed     JARDIANCE 10 MG TABS tablet TAKE 1 TABLET BY MOUTH EVERY DAY BEFORE BREAKFAST 90 tablet 3   ketoconazole (NIZORAL) 2 % cream Apply 1 application topically as needed.     metoprolol succinate (TOPROL-XL) 100 MG 24 hr tablet TAKE 1 TABLET BY MOUTH TWICE A DAY 180 tablet 3   nitroGLYCERIN (NITROSTAT) 0.4 MG SL tablet Place 1 tablet (0.4 mg total) under the tongue every 5 (five) minutes as needed for chest pain. 30 tablet 1   Omega-3 1000 MG CAPS Take 1,000 mg by mouth every morning.     potassium chloride (KLOR-CON) 10 MEQ tablet TAKE 5 TABLETS (50 MEQ TOTAL) BY MOUTH DAILY. 120 tablet 3   RESTASIS 0.05 % ophthalmic emulsion Place 1 drop into both eyes daily as needed.     rosuvastatin (CRESTOR) 40 MG tablet TAKE 1 TABLET BY MOUTH EVERYDAY AT BEDTIME 90 tablet 2   Tafamidis Meglumine, Cardiac, (VYNDAQEL) 20 MG CAPS Take 4 capsules (80 mg)  by mouth daily. 120 capsule 11   tirzepatide (MOUNJARO) 12.5 MG/0.5ML Pen Inject 12.5 mg into the skin once a week. 2 mL 1   torsemide (DEMADEX) 20 MG tablet Take 4 tablets (80 mg total) by mouth daily. 180 tablet 3   Vitamin A 2400 MCG (8000 UT) CAPS Take 1 tablet by mouth daily.     vutrisiran sodium (AMVUTTRA) 25 MG/0.5ML syringe Inject 0.5 mLs (25 mg total) into the skin every 3 (three) months. 0.5 mL 3   No current facility-administered medications for this encounter.    Allergies:   Tramadol and Spironolactone   Social History:  The patient  reports that she has never smoked. She has never used smokeless tobacco. She reports that she does not drink alcohol and does not use drugs.   Family History:  The patient's family history includes CVA in her mother.   ROS:  Please see the history of present illness.   All other systems are personally reviewed and negative.   Vitals:   BP 110/70   Pulse 90   Wt (!) 149.2 kg (329 lb)    SpO2 96%   BMI 53.10 kg/m   Wt Readings from Last 3 Encounters:  01/20/23 (!) 149.2 kg (329 lb)  10/20/22 (!) 154.9 kg (341 lb 9.6 oz)  08/04/22 (!) 158.3 kg (349 lb)   PHYSICAL EXAM: General: NAD Neck: JVP 7-8 cm, no thyromegaly or thyroid nodule.  Lungs: Clear to auscultation bilaterally with normal respiratory effort. CV: Nondisplaced PMI.  Heart distant sounds with regular S1/S2, no S3/S4, 2/6 SEM RUSB.  No peripheral edema.  No carotid bruit.  Normal pedal pulses.  Abdomen: Soft, nontender, no hepatosplenomegaly, no distention.  Skin: Intact without lesions or rashes.  Neurologic: Alert and oriented x 3.  Psych: Normal affect. Extremities: No clubbing or cyanosis.  HEENT: Normal.   ASSESSMENT AND PLAN: 1. 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  EF 35-40% on TEE in 6/15 but then EF back to 60-65% by 10/15 and remained normal on followup echoes up to 2/24.  LHC without obstructive coronary disease in 3/15 and again in 11/23.  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  Stable/improved NYHA class II. She is not significantly volume overloaded and weight is down 12 lbs.  - Continue torsemide 80 mg daily + KCl 50 mEq daily. BMET/BNP today. - Continue Toprol XL 100 mg bid. - Continue empagliflozin 10 mg daily.  2. 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  Atrial fibrillation and carpal tunnel syndrome, both of which she has, go along with amyloidosis.  Her history of orthostatic symptoms as well as symptoms in toes and feet consistent with peripheral neuropathy are also likely related to amyloidosis.  - Continue tafamidis.   - Continue vutrisiran.   - She was seen by  Dr. 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 Given autosomal dominant condition, Marleigh's daughter is at a 50% risk of inheriting the pathogenic variant in TTR (V142I). Genetic testing for the familial pathogenic variant was recommended but pt's daughter declined.  3. Atrial fibrillation: Last DCCV in 2/20 but back in atrial fibrillation since then.  Dr. Johney Frame saw, decided against atrial fibrillation ablation due to morbid obesity.  EP decided against Tikosyn due to compliance concerns. She failed amiodarone.  She remains in atrial fibrillation, I think that the atrial fibrillation is likely permanent at this point.  Rate controlled w/ ? blocker. - Continue Toprol XL 100 mg bid.  - Continue Eliquis.   4. CAD: Mild, nonobstructive by cath 3/15 and again in 11/23. No chest pain.  - No ASA given Eliquis use.   - Continue Crestor 40 mg daily and Zetia.  5. Bilateral carpal tunnel symptoms: Likely related to amyloidosis.  6. OSA: She says that she cannot tolerate CPAP due to tinnitus.  7.  HTN: BP controlled.  8.  Aortic stenosis: TEE 04/14/22 showed severe AS with mean gradient 40 mmHg, AVA 0.86 by VTI and 0.71 by planimetry with moderate aortic regurgitation. Echo in 2/24 with severe AS.  The valve appeared trileaflet but the right cusp appeared to dome.  Not good TAVR candidate due to RCA position.  Seen in structural heart clinic and recommended to lose weight and proceed with SAVR after weight loss.  She has lost around 60 lbs at this point.  - She has echo scheduled in 9/24 and followup with Dr. Lynnette Caffey in structural heart clinic.  If he remains concerned about her risk  with TAVR, will send back to [facility] for SAVR evaluation.  9. Obesity: Body mass index is 53.1 kg/m. She has lost around 60 lbs so far.  - Continue Mounjaro.   Follow up in 3 months with APP.   Signed, Marca Ancona, MD  01/21/2023  [facility] [place] [address] [facility] [place] [address] [phone] (office) [phone] (fax)

## 2023-01-21 NOTE — Telephone Encounter (Signed)
Patient advised and verbalized understanding,lab appointment scheduled,lab orders entered. Med list updated to reflect changes.   Meds ordered this encounter  Medications   potassium chloride (KLOR-CON) 10 MEQ tablet    Sig: Take 7 tablets (70 mEq total) by mouth daily.    Dispense:  210 tablet    Refill:  2    Please cancel all previous orders for current medication. Change in dosage or pill size.   Orders Placed This Encounter  Procedures   Basic metabolic panel    Standing Status:   Future    Standing Expiration Date:   01/21/2024    Order Specific Question:   Release to patient    Answer:   Immediate    Order Specific Question:   Release to patient    Answer:   Immediate [1]

## 2023-01-21 NOTE — Telephone Encounter (Signed)
-----   Message from Marca Ancona sent at 01/20/2023  9:55 PM EDT ----- Increase total daily KCl by 20 mEq.  BMET in 10 days.

## 2023-01-22 ENCOUNTER — Other Ambulatory Visit (HOSPITAL_COMMUNITY): Payer: Self-pay

## 2023-01-23 ENCOUNTER — Other Ambulatory Visit (HOSPITAL_COMMUNITY): Payer: Self-pay

## 2023-01-23 ENCOUNTER — Encounter (HOSPITAL_COMMUNITY): Payer: Medicare HMO | Admitting: Cardiology

## 2023-01-26 ENCOUNTER — Ambulatory Visit (HOSPITAL_COMMUNITY): Payer: Medicare HMO

## 2023-01-27 ENCOUNTER — Other Ambulatory Visit (HOSPITAL_COMMUNITY): Payer: Self-pay | Admitting: Cardiology

## 2023-01-29 ENCOUNTER — Ambulatory Visit (HOSPITAL_COMMUNITY)
Admission: RE | Admit: 2023-01-29 | Discharge: 2023-01-29 | Disposition: A | Discharge status: Home / Self Care | Payer: Medicare HMO | Source: Ambulatory Visit | Attending: Cardiology | Admitting: Cardiology

## 2023-01-29 DIAGNOSIS — I5032 Chronic diastolic (congestive) heart failure: Secondary | ICD-10-CM | POA: Diagnosis present

## 2023-01-29 LAB — BASIC METABOLIC PANEL
Anion gap: 16 — ABNORMAL HIGH (ref 5–15)
BUN: 22 mg/dL (ref 8–23)
CO2: 28 mmol/L (ref 22–32)
Calcium: 9.4 mg/dL (ref 8.9–10.3)
Chloride: 97 mmol/L — ABNORMAL LOW (ref 98–111)
Creatinine, Ser: 1.3 mg/dL — ABNORMAL HIGH (ref 0.44–1.00)
GFR, Estimated: 45 mL/min — ABNORMAL LOW (ref >60)
Glucose, Bld: 94 mg/dL (ref 70–99)
Potassium: 3.6 mmol/L (ref 3.5–5.1)
Sodium: 141 mmol/L (ref 135–145)

## 2023-02-02 ENCOUNTER — Ambulatory Visit: Payer: Medicare HMO | Admitting: Internal Medicine

## 2023-02-02 ENCOUNTER — Telehealth: Payer: Self-pay | Admitting: Pharmacist

## 2023-02-02 ENCOUNTER — Other Ambulatory Visit (HOSPITAL_COMMUNITY): Payer: Self-pay

## 2023-02-02 MED ORDER — MOUNJARO 15 MG/0.5ML ~~LOC~~ SOAJ: 15.0000 mg | SUBCUTANEOUS | 0 refills | Status: DC

## 2023-02-02 MED ORDER — MOUNJARO 15 MG/0.5ML ~~LOC~~ SOAJ
15.0000 mg | SUBCUTANEOUS | 5 refills | Status: DC
  Filled 2023-02-02 – 2023-02-11 (×5): qty 2, 28d supply, fill #0
  Filled 2023-04-09: qty 2, 28d supply, fill #1
  Filled 2023-05-18: qty 2, 28d supply, fill #2
  Filled 2023-06-15: qty 2, 28d supply, fill #3
  Filled 2023-07-14: qty 2, 28d supply, fill #4
  Filled 2023-08-14: qty 2, 28d supply, fill #5

## 2023-02-02 NOTE — Telephone Encounter (Signed)
Tolerates 12.5 mg Mounjaro dose well without any side effect. Has lost 27 lbs, does not check BG at home but at PCP visit it was good - does not recall numbers.    Willing to try 15 mg once a week dose and wants [place] to follow up on tolerability in 4 weeks. If tolerated well we'll add refills.

## 2023-02-03 ENCOUNTER — Other Ambulatory Visit (HOSPITAL_COMMUNITY): Payer: Self-pay

## 2023-02-03 ENCOUNTER — Other Ambulatory Visit: Payer: Self-pay

## 2023-02-05 ENCOUNTER — Other Ambulatory Visit: Payer: Self-pay | Admitting: Internal Medicine

## 2023-02-05 ENCOUNTER — Other Ambulatory Visit (HOSPITAL_COMMUNITY): Payer: Self-pay | Admitting: Cardiology

## 2023-02-05 ENCOUNTER — Other Ambulatory Visit (HOSPITAL_COMMUNITY): Payer: Self-pay

## 2023-02-06 ENCOUNTER — Other Ambulatory Visit (HOSPITAL_COMMUNITY): Payer: Self-pay

## 2023-02-07 ENCOUNTER — Encounter (HOSPITAL_COMMUNITY): Payer: Self-pay

## 2023-02-09 ENCOUNTER — Ambulatory Visit (HOSPITAL_COMMUNITY): Payer: Medicare HMO | Attending: Internal Medicine

## 2023-02-09 DIAGNOSIS — I152 Hypertension secondary to endocrine disorders: Secondary | ICD-10-CM | POA: Insufficient documentation

## 2023-02-09 DIAGNOSIS — I5022 Chronic systolic (congestive) heart failure: Secondary | ICD-10-CM | POA: Diagnosis present

## 2023-02-09 DIAGNOSIS — E854 Organ-limited amyloidosis: Secondary | ICD-10-CM | POA: Diagnosis present

## 2023-02-09 DIAGNOSIS — Z6841 Body Mass Index (BMI) 40.0 and over, adult: Secondary | ICD-10-CM | POA: Insufficient documentation

## 2023-02-09 DIAGNOSIS — E785 Hyperlipidemia, unspecified: Secondary | ICD-10-CM | POA: Insufficient documentation

## 2023-02-09 DIAGNOSIS — I4891 Unspecified atrial fibrillation: Secondary | ICD-10-CM | POA: Diagnosis present

## 2023-02-09 DIAGNOSIS — I350 Nonrheumatic aortic (valve) stenosis: Secondary | ICD-10-CM | POA: Diagnosis present

## 2023-02-09 DIAGNOSIS — E119 Type 2 diabetes mellitus without complications: Secondary | ICD-10-CM | POA: Insufficient documentation

## 2023-02-09 DIAGNOSIS — E1159 Type 2 diabetes mellitus with other circulatory complications: Secondary | ICD-10-CM | POA: Diagnosis present

## 2023-02-09 DIAGNOSIS — E1169 Type 2 diabetes mellitus with other specified complication: Secondary | ICD-10-CM | POA: Insufficient documentation

## 2023-02-09 DIAGNOSIS — I2510 Atherosclerotic heart disease of native coronary artery without angina pectoris: Secondary | ICD-10-CM | POA: Diagnosis present

## 2023-02-09 DIAGNOSIS — I43 Cardiomyopathy in diseases classified elsewhere: Secondary | ICD-10-CM | POA: Insufficient documentation

## 2023-02-09 LAB — ECHOCARDIOGRAM COMPLETE
AR max vel: 0.82 cm2
AV Area VTI: 0.75 cm2
AV Area mean vel: 0.74 cm2
AV Mean grad: 38 mmHg
AV Peak grad: 57.5 mmHg
Ao pk vel: 3.79 m/s
Area-P 1/2: 3.78 cm2
P 1/2 time: 894 msec
S' Lateral: 3.4 cm

## 2023-02-10 ENCOUNTER — Other Ambulatory Visit (HOSPITAL_COMMUNITY): Payer: Self-pay

## 2023-02-10 ENCOUNTER — Ambulatory Visit (HOSPITAL_COMMUNITY)
Admission: RE | Admit: 2023-02-10 | Discharge: 2023-02-10 | Disposition: A | Discharge status: Home / Self Care | Payer: Medicare HMO | Source: Ambulatory Visit | Attending: Cardiology

## 2023-02-10 DIAGNOSIS — G63 Polyneuropathy in diseases classified elsewhere: Secondary | ICD-10-CM | POA: Insufficient documentation

## 2023-02-10 DIAGNOSIS — E851 Neuropathic heredofamilial amyloidosis: Secondary | ICD-10-CM | POA: Insufficient documentation

## 2023-02-10 MED ORDER — VUTRISIRAN SODIUM 25 MG/0.5ML ~~LOC~~ SOSY
25.0000 mg | PREFILLED_SYRINGE | Freq: Once | SUBCUTANEOUS | Status: AC
  Administered 2023-02-10: 25 mg via SUBCUTANEOUS

## 2023-02-10 NOTE — Progress Notes (Signed)
Advanced [facility] Clinic Note   PCP:  Darryl Lent, PA-C  HF Cardiologist:  Dr. Shirlee Latch  HPI:  Kimberly [name] is a 67 y.o. female who has a history of paroxysmal atrial fibrillation, obesity, and nonischemic cardiomyopathy.  She was admitted in 07/2013 at [facility] with atrial fibrillation and CHF.  TEE was done in preparation for DCCV, showing EF 25-30% but there was LA thrombus so DCCV was not done.  Plan was for anticoagulation x 3 months followed by TEE-guided DCCV in the OR (due to patient's body habitus).  LHC was also done, showing mild nonobstructive CAD.  Patient was diuresed with IV Lasix in the hospital and started on apixaban.    Patient had TEE again in 10/2013, showing EF 35-40% with diffuse hypokinesis and no LAA thrombus.  She was cardioverted to NSR (with difficulty).  Was started on amiodarone.  She then went back into atrial fibrillation.  After she had been loaded on amiodarone, cardioverted her again in 11/2013 to NSR.     She had gone back into atrial fibrillation in 05/2016 and amiodarone was increased to BID in preparation for DCCV. However, she spontaneously converted to NSR.   PYP scan in 08/2017 was grade 3, strongly suggestive of TTR amyloidosis.  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  Echo 12/2017 showed EF 60%, moderate LVH, mild aortic stenosis.  Of note, she has carpal tunnel syndrome.  She was started on tafamidis.    She went back into atrial fibrillation and had DCCV in 06/2018 but was back in atrial fibrillation a few weeks later when she went to atrial fibrillation clinic.  Dr. Johney Frame thought that morbid obesity precluded atrial fibrillation ablation and concerns about compliance led them not to start her on Tikosyn.  Instead, it was recommended that she start on CPAP for her OSA, lose weight, and re-attempt DCCV while on amiodarone.    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  In 7/21, she developed hematochezia and had EGD/colonoscopy which were unrevealing except for internal hemorrhoids.    Echo in 02/2021 showed EF 60-65%, normal RV, moderate AS mean gradient 20 mmHg with AVA 1.56 cm^2, mild AI.    Echo 04/08/22  EF 55-60%, mild LVH, mildly decreased RV systolic function with normal RV size, PASP 47 mmHg, paradoxical low flow/low gradient severe AS (mean 25 mmHg, AVA 0.6 cm^2).    Admitted after TEE/LHC/RHC in 11/23 for aortic stenosis workup. TEE showed an abnormal trileaflet aortic valve with doming of the right coronary cusp. There was severe aortic stenosis with moderate aortic insufficiency. LHC showed nonobstructive CAD. RHC showed elevated right and left heart filling pressures with pulmonary venous hypertension. Admitted with acute HFpEF. Diuresed well with IV Lasix, weight down 16 lbs during admission. Home diuretics regimen adjusted with torsemide increased to 80 mg daily. She was referred to structural heart team for possible TAVR.    She saw [facility] who thought that she would be best-suited for TAVR.  She saw Dr Lynnette Caffey who was concerned about the position of her RCA and worried about disruption of the ostium of the vessel with TAVR.  He recommended weight loss and SAVR.  She agreed to start [facility]. Echo was repeated in 2/24, showing EF 55-60%, mild LVH, mild RV dysfunction, severe MR with AVA 0.8 cm^2 and mean gradient 33 mmHg.  Today she returns to HF clinic for  repeat injection of Amvuttra. Overall feeling ok today. No contraindications to injection today. Injection administered in R arm as she stated her left arm had been "giving her trouble".     Assessment/Plan: 1. 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  EF 35-40% on TEE in 10/2013 but then EF back to 60-65% by 02/2014 and remained normal on echo in 12/2017 and in 02/2021.  LHC without significant coronary disease in 07/2013.  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   - Stable NYHA class II-III.  - Continue torsemide 80 mg daily + KCl 50 mEq daily. - Continue Toprol XL 100 mg BID. - Continue empagliflozin 10 mg daily.  2. 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  Atrial fibrillation and carpal tunnel syndrome, both of which she has, go along with amyloidosis.  Her history of orthostatic symptoms as well as symptoms in toes and feet consistent with peripheral neuropathy are also likely related to amyloidosis.  - Continue tafamidis.   - Amvuttra (vutrisiran) injection administered in clinic today. Patient tolerated injection well. Provided patient counseling on Amvuttra. Most common side effects are injection site reactions, arthralgias and dyspnea. Patient is aware to return to clinic every 3 months for repeat injection. Amvuttra will be obtained from [facility] and couriered to clinic for injection.  - Continue vitamin A supplement 8000 IU daily. Amvuttra decreases serum vitamin A levels. - She was seen by  Dr. 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 Given autosomal dominant condition, Katelynn's daughter is at a 50% risk of inheriting the pathogenic variant in TTR (V142I). Genetic testing for the familial pathogenic variant was recommended but pt's daughter declined.   Follow up 3 months for repeat Amvuttra injection.   Karle Plumber, PharmD, BCPS, BCCP, CPP Heart Failure Clinic Pharmacist [phone]

## 2023-02-10 NOTE — Patient Instructions (Signed)
It was a pleasure seeing you today!  MEDICATIONS: -No medication changes today --Call if you have questions about your medications.   NEXT APPOINTMENT: Return to clinic in 3 months to repeat Amvuttra.  In general, to take care of your heart failure: -Limit your fluid intake to 2 Liters (half-gallon) per day.   -Limit your salt intake to ideally 2-3 grams (2000-3000 mg) per day. -Weigh yourself daily and record, and bring that "weight diary" to your next appointment.  (Weight gain of 2-3 pounds in 1 day typically means fluid weight.) -The medications for your heart are to help your heart and help you live longer.   -Please contact us before stopping any of your heart medications.  Call the clinic at [phone] with questions or to reschedule future appointments.

## 2023-02-11 ENCOUNTER — Other Ambulatory Visit (HOSPITAL_COMMUNITY): Payer: Self-pay

## 2023-02-11 ENCOUNTER — Other Ambulatory Visit: Payer: Self-pay

## 2023-02-11 NOTE — Progress Notes (Signed)
Patient ID: Kimberly [name] MRN: [id number] DOB/AGE: 1956-02-26 67 y.o.  Primary Care Physician:Taylor, Marchelle Folks, [place] Primary Cardiologist: Marca Ancona, MD   FOCUSED CARDIOVASCULAR PROBLEM LIST:   1.  Severe aortic stenosis and moderate aortic regurgitation with an aortic valve area of 0.71 cm2, mean gradient of 49 mmHg, and ejection fraction of 55 to 60%; EKG demonstrates atrial fibrillation without conduction abnormalities -Review of coronary CTA demonstrates a highly effaced right sinus associated with a dominant right coronary artery that would likely be in jeopardy 2.  Mild obstructive coronary artery disease 3.  Paroxysmal atrial fibrillation on apixaban 4.  Nonischemic cardiomyopathy with ejection fraction of 35 to 40% in 2015 now normalized 5.  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  BMI >50 7.  Left vestibular schwannoma 8.  Type 2 diabetes not on insulin   HISTORY OF PRESENT ILLNESS: December 2023 consultation: The patient is a 67 y.o. female with the indicated medical history here for recommendations regarding her severe aortic valvular disease.  The patient recently underwent an elective TEE to evaluate her aortic valve stenosis.  She was found to be volume overloaded at time and admitted for diuresis.  She lost 16 pounds with IV Lasix and was discharged home with a increased dose of torsemide.  The patient endorses NYHA class II symptoms of shortness of breath.  She feels however better than she did prior to the hospital.  She endorses stable two-pillow orthopnea.  She denies any peripheral edema.  She denies any exertional angina.  She denies any presyncope or syncope.  In terms of her dental health she has not seen a dentist in some time and fears that she might have teeth that need to be extracted.  She has problems with knee pain and so she is not very active.  She tells me when she walks upstairs she will get  short of breath.  She has not had any shortness of breath at rest since discharge.  She occasionally does develop vertigo likely related to her schwannoma.  January 2024: In the interim the patient was seen by cardiothoracic surgery and thought to be best served by a nonsurgical approach to treat her aortic stenosis.  We reviewed her imaging and she has a very effaced right sinus with a dominant right coronary artery which could be jeopardized.  She seems to have femoral access by CT scan with no high-grade obstructions.  The patient is here today and is doing relatively well.  She is able to do her activities of daily living without any significant shortness of breath.  Thing that limits her is knee pain.  She is able to walk up and down the [place] at a grocery store but has to stop because of knee pain.  She denies any paroxysmal nocturnal dyspnea, orthopnea, presyncope, or syncope.  She fortunately has not required any emergency room visits or hospitalizations.  Plan: Refer to pharmacy regarding Ozempic or Mounjaro, obtain echocardiogram, follow-up in 2 months.  March 2024: In the interim the patient had her semaglutide dose increased.  It does not look like she had a discussion with pharmacy regarding Mounjaro or [id number].  Since I saw her.  She denies any significantly increasing shortness of breath.  She does get lightheaded when she goes from laying to sitting in the morning.  She does not have any other episodes of presyncope or syncope.  She denies any chest pain and her chronic shortness of  breath is relatively unchanged.  She is otherwise without significant cardiovascular complaints today.  Plan: Stressed that patient anatomy was suboptimal for transcatheter attic valve replacement given jeopardy of right coronary artery; stressed need to lose weight to become a more optimal surgical candidate.  October 2024: There are interim the patient started [facility] for weight loss.  She has lost about 27  pounds.  Her Mounjaro dose was recently increased.  There was time when she was not able to get the medication may be over 1 or 2 weeks but this has been rectified.  She feels less short of breath.   She is able to walk without having to stop as frequently as before.  She denies any exertional angina.  She has had some lightheadedness when going from sitting to standing.  She does have chronic lightheadedness with her schwannoma but this typically happens when she is lying down.  She fortunately has not required any emergency room visits or hospitalizations.  She generally feels well.  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   left    Past Surgical History:  Procedure Laterality Date   CARDIOVERSION N/A 07/29/2013   Procedure: CARDIOVERSION;  Surgeon: Lars Masson, MD;  Location: [facility] ENDOSCOPY;  Service: Cardiovascular;  Laterality: N/A;   CARDIOVERSION N/A 11/11/2013   Procedure: CARDIOVERSION;  Surgeon: Laurey Morale, MD;  Location: [facility] ENDOSCOPY;  Service: Cardiovascular;  Laterality: N/A;   CARDIOVERSION N/A 12/08/2013   Procedure: CARDIOVERSION;  Surgeon: Laurey Morale, MD;  Location: [facility] ENDOSCOPY;  Service: Cardiovascular;  Laterality: N/A;   CARDIOVERSION N/A 06/30/2018   Procedure: CARDIOVERSION;  Surgeon: Laurey Morale, MD;  Location: [facility] ENDOSCOPY;  Service: Cardiovascular;  Laterality: N/A;   JOINT REPLACEMENT     LEFT HEART CATHETERIZATION WITH CORONARY ANGIOGRAM N/A 08/01/2013   Procedure: LEFT HEART CATHETERIZATION WITH CORONARY ANGIOGRAM;  Surgeon: Micheline Chapman, MD;  Location: [facility] CATH LAB;  Service: Cardiovascular;  Laterality: N/A;    RIGHT/LEFT HEART CATH AND CORONARY ANGIOGRAPHY N/A 04/14/2022   Procedure: RIGHT/LEFT HEART CATH AND CORONARY ANGIOGRAPHY;  Surgeon: Laurey Morale, MD;  Location: [facility] INVASIVE CV LAB;  Service: Cardiovascular;  Laterality: N/A;   TEE WITHOUT CARDIOVERSION N/A 07/29/2013   Procedure: TRANSESOPHAGEAL ECHOCARDIOGRAM (TEE);  Surgeon: Lars Masson, MD;  Location: [facility] ENDOSCOPY;  Service: Cardiovascular;  Laterality: N/A;   TEE WITHOUT CARDIOVERSION N/A 11/11/2013   Procedure: TRANSESOPHAGEAL ECHOCARDIOGRAM (TEE);  Surgeon: Laurey Morale, MD;  Location: [facility] ENDOSCOPY;  Service: Cardiovascular;  Laterality: N/A;   TEE WITHOUT CARDIOVERSION N/A 04/14/2022   Procedure: TRANSESOPHAGEAL ECHOCARDIOGRAM (TEE);  Surgeon: Laurey Morale, MD;  Location: [facility] ENDOSCOPY;  Service: Cardiovascular;  Laterality: N/A;    Family History  Problem Relation Age of Onset   CVA Mother     Social History   Socioeconomic History   Marital status: Single    Spouse name: Not on file   Number of children: Not on file   Years of education: Not on file   Highest education level: Not on file  Occupational History   Not on  file  Tobacco Use   Smoking status: Never   Smokeless tobacco: Never  Vaping Use   Vaping status: Never Used  Substance and Sexual Activity   Alcohol use: No    Alcohol/week: 0.0 standard drinks of alcohol   Drug use: No   Sexual activity: Not Currently  Other Topics Concern   Not on file  Social History Narrative   Right handed    One story home alone   Caffeine 2 cups a day   Social Determinants of Health   Financial Resource Strain: Not on file  Food Insecurity: No Food Insecurity (04/14/2022)   Hunger Vital Sign    Worried About Running Out of Food in the Last Year: Never true    Ran Out of Food in the Last Year: Never true  Transportation Needs: No Transportation Needs (04/14/2022)   PRAPARE - Administrator, Civil Service (Medical): No    Lack of Transportation  (Non-Medical): No  Physical Activity: Not on file  Stress: Not on file  Social Connections: Not on file  Intimate Partner Violence: Not At Risk (04/14/2022)   Humiliation, Afraid, Rape, and Kick questionnaire    Fear of Current or Ex-Partner: No    Emotionally Abused: No    Physically Abused: No    Sexually Abused: No     Prior to Admission medications   Medication Sig Start Date End Date Taking? Authorizing Provider  acetaminophen (TYLENOL) 650 MG CR tablet Take 1,300 mg by mouth every 8 (eight) hours as needed for pain.     [provider]  benzonatate (TESSALON) 100 MG capsule Take 100 mg by mouth 3 (three) times daily as needed. 04/01/21   [provider]  Cholecalciferol (VITAMIN D-3) 5000 UNITS TABS Take 5,000 Units by mouth daily.    [provider]  Docusate Sodium (DSS) 100 MG CAPS Take by mouth as needed.    [provider]  ELIQUIS 5 MG TABS tablet TAKE 1 TABLET (5 MG TOTAL) BY MOUTH 2 (TWO) TIMES DAILY. Patient taking differently: Take 5 mg by mouth 2 (two) times daily. 06/07/21   Laurey Morale, MD  ezetimibe (ZETIA) 10 MG tablet Take 0.5 tablets (5 mg total) by mouth daily. Patient not taking: Reported on 04/14/2022 11/28/21 11/28/22  Allayne Butcher, PA-C  Ferrous Sulfate 27 MG TABS Take 27 mg by mouth 2 (two) times daily.     [provider]  fluticasone furoate-vilanterol (BREO ELLIPTA) 100-25 MCG/INH AEPB Inhale 1 puff into the lungs every morning. As needed 06/01/20   [provider]  JARDIANCE 10 MG TABS tablet TAKE 1 TABLET BY MOUTH DAILY BEFORE BREAKFAST. Patient taking differently: Take 10 mg by mouth daily. 07/22/21   Milford, Anderson Malta, FNP  ketoconazole (NIZORAL) 2 % cream Apply 1 application topically as needed. 12/06/19   [provider]  metoprolol succinate (TOPROL-XL) 100 MG 24 hr tablet Take 1 tablet (100 mg total) by mouth 2 (two) times daily. 12/09/21   Laurey Morale, MD  nitroGLYCERIN  (NITROSTAT) 0.4 MG SL tablet Place 1 tablet (0.4 mg total) under the tongue every 5 (five) minutes as needed for chest pain. 04/21/19   Laurey Morale, MD  Omega-3 1000 MG CAPS Take 1,000 mg by mouth every morning.    [provider]  potassium chloride (KLOR-CON) 10 MEQ tablet Take 5 tablets (50 mEq total) by mouth daily. 04/08/22   Laurey Morale, MD  RESTASIS 0.05 % ophthalmic emulsion  Place 1 drop into both eyes daily as needed. 06/13/19   [provider]  rosuvastatin (CRESTOR) 40 MG tablet Take 1 tablet (40 mg total) by mouth at bedtime. 04/02/20   Laurey Morale, MD  Semaglutide (RYBELSUS) 3 MG TABS Take 1 tablet by mouth daily. 04/17/22   Laurey Morale, MD  Tafamidis Meglumine, Cardiac, (VYNDAQEL) 20 MG CAPS Take 4 capsules (80 mg)  by mouth daily. Patient taking differently: Take 40 mg by mouth in the morning and at bedtime. 10/02/21   Laurey Morale, MD  torsemide (DEMADEX) 20 MG tablet Take 4 tablets (80 mg total) by mouth daily. 04/16/22   Clegg, Amy D, NP    Allergies  Allergen Reactions   Tramadol Nausea And Vomiting   Spironolactone Itching    REVIEW OF SYSTEMS:  General: no fevers/chills/night sweats Eyes: no blurry vision, diplopia, or amaurosis ENT: no sore throat or hearing loss Resp: no cough, wheezing, or hemoptysis CV: no edema or palpitations GI: no abdominal pain, nausea, vomiting, diarrhea, or constipation GU: no dysuria, frequency, or hematuria Skin: no rash Neuro: no headache, numbness, tingling, or weakness of extremities Musculoskeletal: no joint pain or swelling Heme: no bleeding, DVT, or easy bruising Endo: no polydipsia or polyuria  BP 110/76   Pulse 86   Ht 5\' 6"  (1.676 m)   Wt (!) 335 lb 3.2 oz (152 kg)   SpO2 97%   BMI 54.10 kg/m   PHYSICAL EXAM: GEN:  AO x 3 in no acute distress HEENT: normal Dentition: Normal Neck: JVP normal. +2carotid upstrokes without bruits. No thyromegaly. Lungs: equal expansion, clear  bilaterally CV: Apex is discrete and nondisplaced, RRR with very distant heart sounds: I cannot hear a discrete murmur Abd: soft, non-tender, non-distended; no bruit; positive bowel sounds Ext: no edema, ecchymoses, or cyanosis Vascular: 2+ femoral pulses, 2+ radial pulses       Skin: warm and dry without rash Neuro: CN II-XII grossly intact; motor and sensory grossly intact    DATA AND STUDIES:  EKG: Atrial fibrillation without conduction disease  TTE:  February 2024  1. Left ventricular ejection fraction, by estimation, is 55 to 60%. The  left ventricle has normal function. The left ventricle demonstrates  regional wall motion abnormalities with possible basal inferior  hypokinesis. There is mild concentric left  ventricular hypertrophy. Left ventricular diastolic parameters are  indeterminate.   2. Right ventricular systolic function is mildly reduced. The right  ventricular size is mildly enlarged. Tricuspid regurgitation signal is  inadequate for assessing PA pressure.   3. Left atrial size was mildly dilated.   4. Right atrial size was mildly dilated.   5. The mitral valve is normal in structure. Mild mitral valve  regurgitation. No evidence of mitral stenosis.   6. The aortic valve is tricuspid. There is severe calcifcation of the  aortic valve. Aortic valve regurgitation is mild to moderate. Paradoxical  low flow/low gradient severe aortic valve stenosis. Aortic valve area, by  VTI measures 0.80 cm. Aortic valve   mean gradient measures 33.0 mmHg.   7. The inferior vena cava is dilated in size with >50% respiratory  variability, suggesting right atrial pressure of 8 mmHg.   8. The patient was in atrial fibrillation.   CARDIAC CATH: November 2023 1. Elevated right and left heart filling pressures.  2. Pulmonary venous hypertension. 3. Peak to peak aortic valve gradient 18 mmHg 4. Nonobstructive mild CAD.   STS RISK CALCULATOR: Pending  NHYA CLASS:  2    ASSESSMENT AND PLAN:   Aortic valve stenosis, etiology of cardiac valve disease unspecified - Plan: ECHOCARDIOGRAM COMPLETE  Chronic systolic CHF (congestive heart failure) (HCC)  Cardiac amyloidosis (HCC)  Type 2 diabetes mellitus without complication, without long-term current use of insulin (HCC)  Hypertension associated with diabetes (HCC)  Hyperlipidemia associated with type 2 diabetes mellitus (HCC)  BMI 50.0-59.9, adult (HCC)  The patient has lost close to 30 pounds.  She is somewhat improved from a dyspnea standpoint but she has had some lightheadedness.  Recent echocardiogram demonstrated severe aortic stenosis with a preserved ejection fraction.  I am hopeful that with the increased dose of Mounjaro the patient can lose even more weight.  I will discuss with Dr. Lucien Mons and I think the best thing would be to hold off on aortic valve intervention at this point in time to allow her to lose more weight and make her more optimal surgical candidate.  I will see her back in 3 months with another echocardiogram.   Orbie Pyo, MD  02/17/2023 9:09 AM    [facility] [address] [place], [place], [place]  [address] Phone: [phone]; Fax: [phone]

## 2023-02-12 ENCOUNTER — Other Ambulatory Visit (HOSPITAL_COMMUNITY): Payer: Self-pay

## 2023-02-12 ENCOUNTER — Other Ambulatory Visit (HOSPITAL_COMMUNITY): Payer: Self-pay | Admitting: Cardiology

## 2023-02-12 NOTE — Telephone Encounter (Signed)
This is a CHF pt

## 2023-02-16 ENCOUNTER — Telehealth: Payer: Self-pay | Admitting: Internal Medicine

## 2023-02-16 NOTE — Telephone Encounter (Signed)
Patient was returning phone call 

## 2023-02-16 NOTE — Telephone Encounter (Signed)
Returned call. Pt states the call was a reminder for her appointment tomorrow am with Dr. Lynnette Caffey.  Reviewed echo results w her.  She will discuss further tomorrow.

## 2023-02-17 ENCOUNTER — Ambulatory Visit: Payer: Medicare HMO | Attending: Internal Medicine | Admitting: Internal Medicine

## 2023-02-17 ENCOUNTER — Encounter: Payer: Self-pay | Admitting: Internal Medicine

## 2023-02-17 VITALS — BP 110/76 | HR 86 | Ht 66.0 in | Wt 335.2 lb

## 2023-02-17 DIAGNOSIS — E1169 Type 2 diabetes mellitus with other specified complication: Secondary | ICD-10-CM

## 2023-02-17 DIAGNOSIS — I152 Hypertension secondary to endocrine disorders: Secondary | ICD-10-CM

## 2023-02-17 DIAGNOSIS — E854 Organ-limited amyloidosis: Secondary | ICD-10-CM

## 2023-02-17 DIAGNOSIS — Z6841 Body Mass Index (BMI) 40.0 and over, adult: Secondary | ICD-10-CM

## 2023-02-17 DIAGNOSIS — E785 Hyperlipidemia, unspecified: Secondary | ICD-10-CM

## 2023-02-17 DIAGNOSIS — E1159 Type 2 diabetes mellitus with other circulatory complications: Secondary | ICD-10-CM

## 2023-02-17 DIAGNOSIS — E119 Type 2 diabetes mellitus without complications: Secondary | ICD-10-CM

## 2023-02-17 DIAGNOSIS — I350 Nonrheumatic aortic (valve) stenosis: Secondary | ICD-10-CM | POA: Diagnosis not present

## 2023-02-17 DIAGNOSIS — I5022 Chronic systolic (congestive) heart failure: Secondary | ICD-10-CM | POA: Diagnosis not present

## 2023-02-17 DIAGNOSIS — I43 Cardiomyopathy in diseases classified elsewhere: Secondary | ICD-10-CM

## 2023-02-17 NOTE — Patient Instructions (Addendum)
Medication Instructions:  No changes *If you need a refill on your cardiac medications before your next appointment, please call your pharmacy*   Lab Work: none If you have labs (blood work) drawn today and your tests are completely normal, you will receive your results only by: MyChart Message (if you have MyChart) OR A paper copy in the mail If you have any lab test that is abnormal or we need to change your treatment, we will call you to review the results.   Testing/Procedures: Your physician has requested that you have an echocardiogram. Echocardiography is a painless test that uses sound waves to create images of your heart. It provides your doctor with information about the size and shape of your heart and how well your heart's chambers and valves are working. This procedure takes approximately one hour. There are no restrictions for this procedure. Please do NOT wear cologne, perfume, aftershave, or lotions (deodorant is allowed). Please arrive 15 minutes prior to your appointment time.   Follow-Up: At [facility], you and your health needs are our priority.  As part of our continuing mission to provide you with exceptional heart care, we have created designated Provider Care Teams.  These Care Teams include your primary Cardiologist (physician) and Advanced Practice Providers (APPs -  Physician Assistants and Nurse Practitioners) who all work together to provide you with the care you need, when you need it.  We recommend signing up for the patient portal called "MyChart".  Sign up information is provided on this After Visit Summary.  MyChart is used to connect with patients for Virtual Visits (Telemedicine).  Patients are able to view lab/test results, encounter notes, upcoming appointments, etc.  Non-urgent messages can be sent to your provider as well.   To learn more about what you can do with MyChart, go to [email or web address].    Your next appointment:   3  month(s)  Provider:   Alverda Skeans, MD

## 2023-02-26 ENCOUNTER — Telehealth: Payer: Self-pay | Admitting: Pharmacist

## 2023-02-26 NOTE — Telephone Encounter (Signed)
Call to follow up on Mounjaro. Patient reports she has been tolerating the 15 mg once week dose well without side effects. Last prescription was ordered with 5 refills.

## 2023-03-02 ENCOUNTER — Ambulatory Visit: Payer: Medicare HMO | Admitting: Nurse Practitioner

## 2023-03-13 ENCOUNTER — Other Ambulatory Visit: Payer: Self-pay

## 2023-03-13 NOTE — Progress Notes (Signed)
Specialty Pharmacy Refill Coordination Note  Chrystal Burgard is a 67 y.o. female contacted today regarding refills of specialty medication(s) [name]   Patient requested Delivery   Delivery date: 03/17/23   Verified address: [address]   [place] [address]   Medication will be filled on 03/16/23.

## 2023-03-25 ENCOUNTER — Other Ambulatory Visit: Payer: Self-pay

## 2023-03-30 DIAGNOSIS — N189 Chronic kidney disease, unspecified: Secondary | ICD-10-CM | POA: Diagnosis not present

## 2023-03-30 DIAGNOSIS — D333 Benign neoplasm of cranial nerves: Secondary | ICD-10-CM | POA: Diagnosis not present

## 2023-03-30 DIAGNOSIS — R928 Other abnormal and inconclusive findings on diagnostic imaging of breast: Secondary | ICD-10-CM | POA: Diagnosis not present

## 2023-03-30 DIAGNOSIS — H8112 Benign paroxysmal vertigo, left ear: Secondary | ICD-10-CM | POA: Diagnosis not present

## 2023-03-30 DIAGNOSIS — R42 Dizziness and giddiness: Secondary | ICD-10-CM | POA: Diagnosis not present

## 2023-03-30 DIAGNOSIS — I129 Hypertensive chronic kidney disease with stage 1 through stage 4 chronic kidney disease, or unspecified chronic kidney disease: Secondary | ICD-10-CM | POA: Diagnosis not present

## 2023-04-01 ENCOUNTER — Other Ambulatory Visit: Payer: Self-pay

## 2023-04-02 ENCOUNTER — Other Ambulatory Visit (HOSPITAL_COMMUNITY): Payer: Self-pay

## 2023-04-02 ENCOUNTER — Other Ambulatory Visit: Payer: Self-pay

## 2023-04-02 ENCOUNTER — Encounter (HOSPITAL_COMMUNITY): Payer: Self-pay

## 2023-04-02 NOTE — Progress Notes (Signed)
Specialty Pharmacy Refill Coordination Note  Kimberly [name] is a 67 y.o. female contacted today regarding refills of specialty medication(s) [name]   Patient requested Delivery   Delivery date: 04/13/23   Verified address: [address]   [place] [address]   Medication will be filled on 04/10/23.

## 2023-04-02 NOTE — Progress Notes (Signed)
Specialty Pharmacy Ongoing Clinical Assessment Note  Kimberly [name] is a 67 y.o. female who is being followed by the specialty pharmacy service for RxSp Cardiology   Patient's specialty medication(s) reviewed today: [name]   Missed doses in the last 4 weeks: 0   Patient/Caregiver did not have any additional questions or concerns.   Therapeutic benefit summary: Patient is achieving benefit   Adverse events/side effects summary: No adverse events/side effects   Patient's therapy is appropriate to: Continue    Goals Addressed             This Visit's Progress    Stabilization of disease       Patient is on track. Patient will maintain adherence         Follow up:  6 months  Otto Herb Specialty Pharmacist

## 2023-04-08 ENCOUNTER — Other Ambulatory Visit: Payer: Self-pay | Admitting: Internal Medicine

## 2023-04-09 ENCOUNTER — Other Ambulatory Visit (HOSPITAL_COMMUNITY): Payer: Self-pay

## 2023-04-10 ENCOUNTER — Other Ambulatory Visit (HOSPITAL_COMMUNITY): Payer: Self-pay

## 2023-04-10 ENCOUNTER — Other Ambulatory Visit (HOSPITAL_BASED_OUTPATIENT_CLINIC_OR_DEPARTMENT_OTHER): Payer: Self-pay

## 2023-04-10 ENCOUNTER — Other Ambulatory Visit: Payer: Self-pay

## 2023-04-13 ENCOUNTER — Other Ambulatory Visit: Payer: Self-pay

## 2023-04-13 NOTE — Progress Notes (Signed)
Date:  04/20/2023   ID:  Kimberly [name], DOB [birth year], MRN [id number]   Provider location: [place] [facility] Type of Visit: Established patient   PCP:  Darryl Lent, PA-C  HF Cardiologist:  Dr. Shirlee Latch   History of Present Illness: Kimberly [name] is a 67 y.o. female who has a history of paroxysmal atrial fibrillation, obesity, and nonischemic cardiomyopathy.  She was admitted in 3/15 at [facility] with atrial fibrillation and CHF.  TEE was done in preparation for DCCV, showing EF 25-30% but there was LA thrombus so DCCV was not done.  Plan was for anticoagulation x 3 months followed by TEE-guided DCCV in the OR (due to patient's body habitus).  LHC was also done, showing mild nonobstructive CAD.  Patient was diuresed with IV Lasix in the hospital and started on apixaban.    Patient had TEE again in 6/15, showing EF 35-40% with diffuse hypokinesis and no LAA thrombus.  She was cardioverted to NSR (with difficulty).  I started her at that time on amiodarone.  She then went back into atrial fibrillation.  After she had been loaded on amiodarone, cardioverted her again in 7/15 to NSR.     She had gone back into atrial fibrillation in 1/18 and amiodarone was increased to bid in preparation for DCCV. However, she spontaneously converted to NSR.   PYP scan in 4/19 was grade 3, strongly suggestive of TTR amyloidosis.  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  Echo 8/19 showed EF 60%, moderate LVH, mild aortic stenosis.  Of note, she has carpal tunnel syndrome.  She was started on tafamidis.    She went back into atrial fibrillation and had DCCV in 2/20 but was back in atrial fibrillation a few weeks later when she went to atrial fibrillation clinic.  Dr. Johney Frame thought that morbid obesity precluded atrial fibrillation ablation and concerns about compliance led them not to start her on Tikosyn.  Instead, it was recommended that she start on CPAP for her  OSA, lose weight, and re-attempt DCCV while on amiodarone.   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  In 7/21, she developed hematochezia and had EGD/colonoscopy which were unrevealing except for internal hemorrhoids.   Echo in 10/22 showed EF 60-65%, normal RV, moderate AS mean gradient 20 mmHg with AVA 1.56 cm^2, mild AI.   Echo 04/08/22  EF 55-60%, mild LVH, mildly decreased RV systolic function with normal RV size, PASP 47 mmHg, paradoxical low flow/low gradient severe AS (mean 25 mmHg, AVA 0.6 cm^2).   Admitted after TEE/LHC/RHC in 11/23 for aortic stenosis workup. TEE showed an abnormal trileaflet aortic valve with doming of the right coronary cusp. There was severe aortic stenosis with moderate aortic insufficiency. LHC showed nonobstructive CAD. RHC showed elevated right and left heart filling pressures with pulmonary venous hypertension. Admitted with acute HFpEF. Diuresed well with IV lasix, weight down 16 lbs during admission. Home diuretics regimen adjusted with torsemide increased to 80 mg daily. She was referred to structural heart team for possible TAVR.   She saw Dr. Delia Chimes who thought that she would be best-suited for TAVR.  She saw Dr Lynnette Caffey who was concerned about the position of her RCA and worried about disruption of the ostium of the vessel with TAVR.  He recommended weight loss and SAVR.  She agreed to start [facility]. Echo was repeated in  2/24, showing EF 55-60%, mild LVH, mild RV dysfunction, severe AS with AVA 0.8 cm^2 and mean gradient 33 mmHg, mild-moderate AI.  Echo 9/24 showed EF 60-65%, normal RV, severe aortic stenosis, calcified aortic valve with severe AS (mean gradient 38 mmHg, AVA 0.75 cm2)  Today she returns for HF follow up. Overall feeling fine. She is not SOB walking on flat ground, she is limited by bilateral knee OA and uses a cane with ambulation. She continues with bilateral  hand neuropathy. She is chronically dizzy, no falls. Denies palpitations, CP,  edema, or PND/Orthopnea. She has a adjustable bed. Appetite ok. No fever or chills. Weight at home 322 pounds. Taking all medications. Weight is down from 380-->322 lbs on tirzepatide.  Labs (4/19): LFTs normal, K 4, creatinine 1.24, TSH normal, immunofixation normal Labs (8/19): TSH normal, K 3.8, creatinine 1.27, LFTs normal, TSH normal Labs (11/19): K 3.8, creatinine 1.21 Labs (2/20): LDL 114, hgb 10.5 Labs (3/20): K 4.4, creatinine 1.42 Labs (4/20): K 4.2, creatinine 1.23 Labs (7/21): K 3.8, creatinine 1.23 Labs (8/21): hgb 11.2 Labs (10/21): creatinine 2.1 Labs (5/22): K 3.5, creatinine 1.46 Labs (8/22): K 3.4, creatinine 1.26 Labs (1/23): BNP 127, K 4, creatinine 1.4, LDL 112 Labs (4/23): Scr 1.3, K 3.7, LDL 96  Labs (7/23): BNP 110 Labs (11/23): K 3.7, creatinine 1.3  Labs (1/24): K 3.7, creatinine 1.29, hgb 11.2, BNP 154 Labs (6/24): K 3.6, creatinine 1.28, BNP 199, hgb 10.4 Labs (9/24): K 3.6, creatinine 1.30   PMH: 1. Obese 2. Atrial fibrillation: Paroxysmal => chronic.  Unable to cardiovert in 3/15 due to LAA thrombus noted on TEE. TEE in 6/15 without LAA thrombus.  Patient had cardioversion to NSR with difficulty but back in atrial fibrillation by 7/15 appt.  She was started on amiodarone and cardioverted in 7/15.   - DCCV in 2/20 but back in atrial fibrillation by 3/20, has been in atrial fibrillation since that time.  3. H/o TKR 4. Nonischemic cardiomyopathy: TEE (3/15) with EF 25-30%, moderate MR, PA systolic pressure 51 mmHg, moderate to severe TR, LA thrombus was noted.   LHC (3/15) with 50% stenosis small PLV.  TEE (6/15) with EF 35-40%, diffuse hypokinesis, mild MR, moderate TR, +PFO, no LA appendage thrombus.  - Echo (10/15): with EF 60-65%.   - Echo (1/18): EF 60-65% Mild AS - Echo (8/19): EF 60%, moderate LVH, mild aortic stenosis.  - PYP scan in 4/19 was grade 3, strongly suggestive of  transthyretin amyloidosis.  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  - Echo (11/21): EF 55-60%, mild LV dilation, RV normal, mild-moderate AS with mean gradient 17 mmHg and AVA 1.07 cm^2.  - Echo (10/22): EF 60-65%, normal RV, moderate AS mean gradient 20 mmHg with AVA 1.56 cm^2, mild AI.  - Echo (11/23): EF 55-60%, mild LVH, mildly decreased RV systolic function with normal RV size, PASP 47 mmHg, paradoxical low flow/low gradient severe AS (mean 25 mmHg, AVA 0.6 cm^2). - TEE 04/14/22: EF 55-60%, RV okay, moderate LAE, moderate AI, severe AS with mean gradient of 40 mmHg, AVA by VTI 0.86 cm2 and 0.71 cm2 by planimetry - RHC (11/23): mean RA 16, PA 49/31 mean 37, mean PCWP 26, CI 2.47, PVR 1.7 WU.  - Echo (2/24): EF 55-60%, mild LVH, mild RV dysfunction, severe AS with AVA 0.8 cm^2 and mean gradient 33 mmHg, mild-moderate AI. - Echo (9/24): EF 60-65%, normal RV, severe aortic stenosis, calcified aortic valve with severe AS (mean gradient 38 mmHg, AVA 0.75  cm2) 5. CAD: Nonobstructive.  LHC (3/15) with 50% stenosis small PLV. - LHC 04/14/22: 40% stenosis distal LAD. 6. PFO 7. Left Breast mass: Was taken for lumpectomy in 1/18 but mass had resolved.  8. Aortic stenosis: Moderate on 10/22 echo.  - Paradoxical low flow/low gradient severe AS on 11/23 echo.  9. Carpal tunnel syndrome.  10. Vestibular schwannoma s/p gamma knife surgery at [facility].  11. OSA: Not currently using CPAP.  12. Hematochezia: C-scope/EGD in 7/21 showed only internal hemorrhoids.  13. 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   14. Aortic stenosis: Severe AS by TEE in 11/23 and echo in 2/24.   Current Outpatient Medications  Medication Sig Dispense Refill   acetaminophen (TYLENOL) 650 MG CR tablet Take 1,300 mg by mouth every 8 (eight) hours as needed for pain.      apixaban (ELIQUIS) 5 MG TABS tablet TAKE 1 TABLET BY MOUTH TWICE A DAY 60 tablet 11   benzonatate (TESSALON) 100 MG capsule Take 100 mg by mouth 3  (three) times daily as needed.     Cholecalciferol (VITAMIN D-3) 5000 UNITS TABS Take 5,000 Units by mouth daily.     Docusate Sodium (DSS) 100 MG CAPS Take by mouth as needed.     ezetimibe (ZETIA) 10 MG tablet TAKE 1/2 TABLET BY MOUTH DAILY 45 tablet 3   Ferrous Sulfate 27 MG TABS Take 27 mg by mouth 2 (two) times daily.      fluticasone furoate-vilanterol (BREO ELLIPTA) 100-25 MCG/INH AEPB Inhale 1 puff into the lungs every morning. As needed     JARDIANCE 10 MG TABS tablet TAKE 1 TABLET BY MOUTH EVERY DAY BEFORE BREAKFAST 90 tablet 3   ketoconazole (NIZORAL) 2 % cream Apply 1 application topically as needed.     metoprolol succinate (TOPROL-XL) 100 MG 24 hr tablet TAKE 1 TABLET BY MOUTH TWICE A DAY 180 tablet 3   nitroGLYCERIN (NITROSTAT) 0.4 MG SL tablet Place 1 tablet (0.4 mg total) under the tongue every 5 (five) minutes as needed for chest pain. 30 tablet 1   Omega-3 1000 MG CAPS Take 1,000 mg by mouth every morning.     potassium chloride (KLOR-CON) 10 MEQ tablet Take 7 tablets (70 mEq total) by mouth daily. 210 tablet 2   RESTASIS 0.05 % ophthalmic emulsion Place 1 drop into both eyes daily as needed.     rosuvastatin (CRESTOR) 40 MG tablet TAKE 1 TABLET BY MOUTH EVERYDAY AT BEDTIME 90 tablet 2   Tafamidis Meglumine, Cardiac, (VYNDAQEL) 20 MG CAPS Take 4 capsules (80 mg)  by mouth daily. 120 capsule 11   tirzepatide (MOUNJARO) 15 MG/0.5ML Pen Inject 15 mg into the skin once a week. 2 mL 5   torsemide 40 MG TABS Take 80 mg by mouth daily. 60 tablet 5   Vitamin A 2400 MCG (8000 UT) CAPS Take 1 tablet by mouth daily.     vutrisiran sodium (AMVUTTRA) 25 MG/0.5ML syringe Inject 0.5 mLs (25 mg total) into the skin every 3 (three) months. 0.5 mL 3   No current facility-administered medications for this encounter.    Allergies:   Tramadol and Spironolactone   Social History:  The patient  reports that she has never smoked. She has never used smokeless tobacco. She reports that she does not  drink alcohol and does not use drugs.   Family History:  The patient's family history includes CVA in her mother.   ROS:  Please see the history of present illness.  All other systems are personally reviewed and negative.   VS   BP 110/64   Pulse 96   Wt (!) 151.5 kg (334 lb)   SpO2 99%   BMI 53.91 kg/m   Wt Readings from Last 3 Encounters:  04/20/23 (!) 151.5 kg (334 lb)  02/17/23 (!) 152 kg (335 lb 3.2 oz)  01/20/23 (!) 149.2 kg (329 lb)   PHYSICAL EXAM: General:  NAD. No resp difficulty, walked into clinic with cane. HEENT: Normal Neck: Supple. No JVD. Carotids 2+ bilat; no bruits. No lymphadenopathy or thryomegaly appreciated. Cor: PMI nondisplaced. Irregular rate & rhythm. No rubs, gallops, 2/6 SEM RUSB. Lungs: Clear Abdomen: Soft, obese, nontender, nondistended. No hepatosplenomegaly. No bruits or masses. Good bowel sounds. Extremities: No cyanosis, clubbing, rash, edema Neuro: Alert & oriented x 3, cranial nerves grossly intact. Moves all 4 extremities w/o difficulty. Affect pleasant.  ASSESSMENT AND PLAN: 1. 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  EF 35-40% on TEE in 6/15 but then EF back to 60-65% by 10/15 and remained normal on followup echoes up to 2/24.  LHC without obstructive coronary disease in 3/15 and again in 11/23.  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  Stable/improved NYHA class II. She is not significantly volume overloaded. - Continue torsemide 80 mg daily + KCl 50 mEq daily. BMET/BNP today. - Continue Toprol XL 100 mg bid. - Continue empagliflozin 10 mg daily.  2. 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  Atrial fibrillation and carpal tunnel syndrome, both of which  she has, go along with amyloidosis.  Her history of orthostatic symptoms as well as symptoms in toes and feet consistent with peripheral neuropathy are also likely related to amyloidosis.  - Continue tafamidis.   - Continue vutrisiran.   - She was seen by  Dr. 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 Given autosomal dominant condition, Kimberly [name]'s daughter is at a 50% risk of inheriting the pathogenic variant in TTR (V142I). Genetic testing for the familial pathogenic variant was recommended but pt's daughter declined.  3. Atrial fibrillation: Last DCCV in 2/20 but back in atrial fibrillation since then.  Dr. Johney Frame saw, decided against atrial fibrillation ablation due to morbid obesity.  EP decided against Tikosyn due to compliance concerns. She failed amiodarone.  She remains in atrial fibrillation, I think that the atrial fibrillation is likely permanent at this point.  Rate controlled w/ ? blocker. - Continue Toprol XL 100 mg bid.  - Continue Eliquis.   4. CAD: Mild, nonobstructive by cath 3/15 and again in 11/23. No chest pain.  - No ASA given Eliquis use.   - Continue Crestor 40 mg daily and Zetia. Check lipids today. 5. Bilateral carpal tunnel symptoms: Likely related to amyloidosis.  6. OSA: She says that she cannot tolerate CPAP due to tinnitus.  7.  HTN: BP controlled.  8.  Aortic stenosis: TEE 04/14/22 showed severe AS with mean gradient 40 mmHg, AVA 0.86 by VTI and 0.71 by planimetry with moderate aortic regurgitation. Echo in 2/24 with severe AS.  The valve appeared trileaflet but the right cusp appeared to dome.  Not good TAVR candidate due to RCA position.  Seen in structural heart clinic and recommended to lose weight and proceed with SAVR after  weight loss.  She has lost around 60 lbs at this point.  - Echo 9/24 showed worsening of aortic stenosis. She is followed by Dr. Lynnette Caffey in structural heart clinic.  Tentative plan is to allow her more time to lose weight then re-evaluate aortic valve intervention. 9. Obesity: Body mass index is 53.91 kg/m. She has lost around 60 lbs so far.  - Continue Mounjaro.  10. Constipation: Can try OTC Miralax  Follow up in 3 months with Dr. Shirlee Latch  Signed, Jacklynn Ganong, FNP  04/20/2023  [facility] [address] [facility] [place] [address] [phone] (office) [phone] (fax)

## 2023-04-14 ENCOUNTER — Other Ambulatory Visit: Payer: Self-pay

## 2023-04-20 ENCOUNTER — Ambulatory Visit (HOSPITAL_COMMUNITY)
Admission: RE | Admit: 2023-04-20 | Discharge: 2023-04-20 | Disposition: A | Discharge status: Home / Self Care | Payer: Medicare HMO | Source: Ambulatory Visit | Attending: Family Medicine

## 2023-04-20 ENCOUNTER — Encounter (HOSPITAL_COMMUNITY): Payer: Self-pay

## 2023-04-20 VITALS — BP 110/64 | HR 96 | Wt 334.0 lb

## 2023-04-20 DIAGNOSIS — R42 Dizziness and giddiness: Secondary | ICD-10-CM | POA: Diagnosis not present

## 2023-04-20 DIAGNOSIS — E854 Organ-limited amyloidosis: Secondary | ICD-10-CM | POA: Diagnosis not present

## 2023-04-20 DIAGNOSIS — I272 Pulmonary hypertension, unspecified: Secondary | ICD-10-CM | POA: Insufficient documentation

## 2023-04-20 DIAGNOSIS — G629 Polyneuropathy, unspecified: Secondary | ICD-10-CM | POA: Insufficient documentation

## 2023-04-20 DIAGNOSIS — I480 Paroxysmal atrial fibrillation: Secondary | ICD-10-CM | POA: Diagnosis not present

## 2023-04-20 DIAGNOSIS — I43 Cardiomyopathy in diseases classified elsewhere: Secondary | ICD-10-CM | POA: Diagnosis not present

## 2023-04-20 DIAGNOSIS — Z7984 Long term (current) use of oral hypoglycemic drugs: Secondary | ICD-10-CM | POA: Diagnosis not present

## 2023-04-20 DIAGNOSIS — I350 Nonrheumatic aortic (valve) stenosis: Secondary | ICD-10-CM | POA: Diagnosis not present

## 2023-04-20 DIAGNOSIS — M170 Bilateral primary osteoarthritis of knee: Secondary | ICD-10-CM | POA: Insufficient documentation

## 2023-04-20 DIAGNOSIS — H9319 Tinnitus, unspecified ear: Secondary | ICD-10-CM | POA: Insufficient documentation

## 2023-04-20 DIAGNOSIS — K5900 Constipation, unspecified: Secondary | ICD-10-CM | POA: Diagnosis not present

## 2023-04-20 DIAGNOSIS — Z7901 Long term (current) use of anticoagulants: Secondary | ICD-10-CM | POA: Insufficient documentation

## 2023-04-20 DIAGNOSIS — Z79899 Other long term (current) drug therapy: Secondary | ICD-10-CM | POA: Insufficient documentation

## 2023-04-20 DIAGNOSIS — G4733 Obstructive sleep apnea (adult) (pediatric): Secondary | ICD-10-CM | POA: Diagnosis not present

## 2023-04-20 DIAGNOSIS — I428 Other cardiomyopathies: Secondary | ICD-10-CM | POA: Diagnosis not present

## 2023-04-20 DIAGNOSIS — I110 Hypertensive heart disease with heart failure: Secondary | ICD-10-CM | POA: Diagnosis not present

## 2023-04-20 DIAGNOSIS — I4891 Unspecified atrial fibrillation: Secondary | ICD-10-CM

## 2023-04-20 DIAGNOSIS — I10 Essential (primary) hypertension: Secondary | ICD-10-CM | POA: Diagnosis not present

## 2023-04-20 DIAGNOSIS — I5022 Chronic systolic (congestive) heart failure: Secondary | ICD-10-CM | POA: Insufficient documentation

## 2023-04-20 DIAGNOSIS — I082 Rheumatic disorders of both aortic and tricuspid valves: Secondary | ICD-10-CM | POA: Diagnosis not present

## 2023-04-20 DIAGNOSIS — I2510 Atherosclerotic heart disease of native coronary artery without angina pectoris: Secondary | ICD-10-CM | POA: Insufficient documentation

## 2023-04-20 DIAGNOSIS — G5603 Carpal tunnel syndrome, bilateral upper limbs: Secondary | ICD-10-CM | POA: Insufficient documentation

## 2023-04-20 DIAGNOSIS — Z6841 Body Mass Index (BMI) 40.0 and over, adult: Secondary | ICD-10-CM | POA: Diagnosis not present

## 2023-04-20 LAB — BASIC METABOLIC PANEL
Anion gap: 8 (ref 5–15)
BUN: 17 mg/dL (ref 8–23)
CO2: 28 mmol/L (ref 22–32)
Calcium: 9.7 mg/dL (ref 8.9–10.3)
Chloride: 106 mmol/L (ref 98–111)
Creatinine, Ser: 1.08 mg/dL — ABNORMAL HIGH (ref 0.44–1.00)
GFR, Estimated: 57 mL/min — ABNORMAL LOW (ref >60)
Glucose, Bld: 92 mg/dL (ref 70–99)
Potassium: 4.2 mmol/L (ref 3.5–5.1)
Sodium: 142 mmol/L (ref 135–145)

## 2023-04-20 LAB — BRAIN NATRIURETIC PEPTIDE: B Natriuretic Peptide: 139.5 pg/mL — ABNORMAL HIGH (ref 0.0–100.0)

## 2023-04-20 NOTE — Patient Instructions (Addendum)
Thank you for coming in today  If you had labs drawn today, any labs that are abnormal the clinic will call you No news is good news  Medications: Can try over the counter Miralax   Follow up appointments:  Your physician recommends that you schedule a follow-up appointment in:  3 months With Dr. Earlean Shawl will receive a reminder letter in the mail a few months in advance. If you don't receive a letter, please call our office to schedule the follow-up appointment.    Do the following things EVERYDAY: Weigh yourself in the morning before breakfast. Write it down and keep it in a log. Take your medicines as prescribed Eat low salt foods--Limit salt (sodium) to 2000 mg per day.  Stay as active as you can everyday Limit all fluids for the day to less than 2 liters   At the [facility], you and your health needs are our priority. As part of our continuing mission to provide you with exceptional heart care, we have created designated Provider Care Teams. These Care Teams include your primary Cardiologist (physician) and Advanced Practice Providers (APPs- Physician Assistants and Nurse Practitioners) who all work together to provide you with the care you need, when you need it.   You may see any of the following providers on your designated Care Team at your next follow up: Dr Arvilla Meres Dr Marca Ancona Dr. Marcos Eke, NP Robbie Lis, [place] [facility] [place], [place] Brynda Peon, NP Karle Plumber, PharmD   Please be sure to bring in all your medications bottles to every appointment.    Thank you for choosing [facility]  If you have any questions or concerns before your next appointment please send [place] a message through [place] or call our office at [phone].    TO LEAVE A MESSAGE FOR THE NURSE SELECT OPTION 2, PLEASE LEAVE A MESSAGE INCLUDING: YOUR NAME DATE OF BIRTH CALL BACK  NUMBER REASON FOR CALL**this is important as we prioritize the call backs  YOU WILL RECEIVE A CALL BACK THE SAME DAY AS LONG AS YOU CALL BEFORE 4:00 PM

## 2023-04-21 ENCOUNTER — Other Ambulatory Visit (HOSPITAL_COMMUNITY): Payer: Self-pay

## 2023-04-21 LAB — LIPID PANEL
Cholesterol: 147 mg/dL (ref 0–200)
HDL: 56 mg/dL (ref >40)
LDL Cholesterol: 75 mg/dL (ref 0–99)
Total CHOL/HDL Ratio: 2.6 {ratio}
Triglycerides: 79 mg/dL (ref <150)
VLDL: 16 mg/dL (ref 0–40)

## 2023-05-01 ENCOUNTER — Other Ambulatory Visit (HOSPITAL_COMMUNITY): Payer: Self-pay

## 2023-05-01 ENCOUNTER — Other Ambulatory Visit (HOSPITAL_COMMUNITY): Payer: Self-pay | Admitting: Pharmacy Technician

## 2023-05-01 NOTE — Progress Notes (Signed)
Specialty Pharmacy Refill Coordination Note  Kimberly [name] is a 67 y.o. female contacted today regarding refills of specialty medication(s) [name] [name]   Patient requested Delivery   Delivery date: 05/08/23   Verified address: [address] [place]   Medication will be filled on 05/07/23.

## 2023-05-04 ENCOUNTER — Other Ambulatory Visit: Payer: Self-pay

## 2023-05-04 DIAGNOSIS — E559 Vitamin D deficiency, unspecified: Secondary | ICD-10-CM | POA: Diagnosis not present

## 2023-05-04 DIAGNOSIS — E7800 Pure hypercholesterolemia, unspecified: Secondary | ICD-10-CM | POA: Diagnosis not present

## 2023-05-04 DIAGNOSIS — E538 Deficiency of other specified B group vitamins: Secondary | ICD-10-CM | POA: Diagnosis not present

## 2023-05-04 DIAGNOSIS — Z23 Encounter for immunization: Secondary | ICD-10-CM | POA: Diagnosis not present

## 2023-05-04 DIAGNOSIS — D539 Nutritional anemia, unspecified: Secondary | ICD-10-CM | POA: Diagnosis not present

## 2023-05-04 DIAGNOSIS — R5383 Other fatigue: Secondary | ICD-10-CM | POA: Diagnosis not present

## 2023-05-04 DIAGNOSIS — Z79899 Other long term (current) drug therapy: Secondary | ICD-10-CM | POA: Diagnosis not present

## 2023-05-04 DIAGNOSIS — Z780 Asymptomatic menopausal state: Secondary | ICD-10-CM | POA: Diagnosis not present

## 2023-05-05 ENCOUNTER — Other Ambulatory Visit (HOSPITAL_COMMUNITY): Payer: Self-pay

## 2023-05-05 ENCOUNTER — Other Ambulatory Visit: Payer: Self-pay

## 2023-05-05 NOTE — Progress Notes (Signed)
Specialty Pharmacy Refill Coordination Note  Kimberly [name] is a 67 y.o. female contacted today regarding refills of specialty medication(s) Vutrisiran Sodium [name])   Patient requested Courier to Provider Office   Delivery date: 05/11/23   Verified address: [facility] [address] [place] [address]   Medication will be filled on 05/08/23.

## 2023-05-08 ENCOUNTER — Other Ambulatory Visit (HOSPITAL_COMMUNITY): Payer: Self-pay

## 2023-05-08 ENCOUNTER — Other Ambulatory Visit: Payer: Self-pay

## 2023-05-11 ENCOUNTER — Other Ambulatory Visit (HOSPITAL_COMMUNITY): Payer: Self-pay

## 2023-05-12 ENCOUNTER — Other Ambulatory Visit: Payer: Self-pay

## 2023-05-12 ENCOUNTER — Other Ambulatory Visit (HOSPITAL_COMMUNITY): Payer: Self-pay

## 2023-05-18 ENCOUNTER — Ambulatory Visit (HOSPITAL_COMMUNITY)
Admission: RE | Admit: 2023-05-18 | Discharge: 2023-05-18 | Disposition: A | Discharge status: Home / Self Care | Payer: Medicare HMO | Source: Ambulatory Visit | Attending: Cardiology

## 2023-05-18 ENCOUNTER — Telehealth (HOSPITAL_COMMUNITY): Payer: Self-pay | Admitting: Pharmacist

## 2023-05-18 ENCOUNTER — Other Ambulatory Visit (HOSPITAL_COMMUNITY): Payer: Self-pay

## 2023-05-18 DIAGNOSIS — I5022 Chronic systolic (congestive) heart failure: Secondary | ICD-10-CM | POA: Insufficient documentation

## 2023-05-18 DIAGNOSIS — I43 Cardiomyopathy in diseases classified elsewhere: Secondary | ICD-10-CM | POA: Diagnosis not present

## 2023-05-18 DIAGNOSIS — E851 Neuropathic heredofamilial amyloidosis: Secondary | ICD-10-CM

## 2023-05-18 DIAGNOSIS — E854 Organ-limited amyloidosis: Secondary | ICD-10-CM | POA: Diagnosis not present

## 2023-05-18 DIAGNOSIS — I480 Paroxysmal atrial fibrillation: Secondary | ICD-10-CM | POA: Insufficient documentation

## 2023-05-18 DIAGNOSIS — G63 Polyneuropathy in diseases classified elsewhere: Secondary | ICD-10-CM | POA: Diagnosis not present

## 2023-05-18 DIAGNOSIS — Z79899 Other long term (current) drug therapy: Secondary | ICD-10-CM | POA: Diagnosis not present

## 2023-05-18 DIAGNOSIS — I428 Other cardiomyopathies: Secondary | ICD-10-CM | POA: Diagnosis present

## 2023-05-18 DIAGNOSIS — E669 Obesity, unspecified: Secondary | ICD-10-CM | POA: Insufficient documentation

## 2023-05-18 MED ORDER — VUTRISIRAN SODIUM 25 MG/0.5ML ~~LOC~~ SOSY
25.0000 mg | PREFILLED_SYRINGE | Freq: Once | SUBCUTANEOUS | Status: AC
  Administered 2023-05-18: 25 mg via SUBCUTANEOUS

## 2023-05-18 NOTE — Progress Notes (Signed)
[facility] Note   PCP:  Darryl Lent, PA-C  HF Cardiologist:  Dr. Shirlee Latch  HPI:  Kimberly [name] is a 67 y.o. female who has a history of paroxysmal atrial fibrillation, obesity, and nonischemic cardiomyopathy.  She was admitted in 07/2013 at [facility] with atrial fibrillation and CHF.  TEE was done in preparation for DCCV, showing EF 25-30% but there was LA thrombus so DCCV was not done.  Plan was for anticoagulation x 3 months followed by TEE-guided DCCV in the OR (due to patient's body habitus).  LHC was also done, showing mild nonobstructive CAD.  Patient was diuresed with IV Lasix in the hospital and started on apixaban.    Patient had TEE again in 10/2013, showing EF 35-40% with diffuse hypokinesis and no LAA thrombus.  She was cardioverted to NSR (with difficulty).  Was started on amiodarone.  She then went back into atrial fibrillation.  After she had been loaded on amiodarone, cardioverted her again in 11/2013 to NSR.     She had gone back into atrial fibrillation in 05/2016 and amiodarone was increased to BID in preparation for DCCV. However, she spontaneously converted to NSR.   PYP scan in 08/2017 was grade 3, strongly suggestive of TTR amyloidosis.  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  Echo 12/2017 showed EF 60%, moderate LVH, mild aortic stenosis.  Of note, she has carpal tunnel syndrome.  She was started on tafamidis.    She went back into atrial fibrillation and had DCCV in 06/2018 but was back in atrial fibrillation a few weeks later when she went to atrial fibrillation clinic.  Dr. Johney Frame thought that morbid obesity precluded atrial fibrillation ablation and concerns about compliance led them not to start her on Tikosyn.  Instead, it was recommended that she start on CPAP for her OSA, lose weight, and re-attempt DCCV while on amiodarone.    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  In 7/21, she developed hematochezia and had EGD/colonoscopy which were unrevealing except for internal hemorrhoids.    Echo in 02/2021 showed EF 60-65%, normal RV, moderate AS mean gradient 20 mmHg with AVA 1.56 cm^2, mild AI.    Echo 04/08/22  EF 55-60%, mild LVH, mildly decreased RV systolic function with normal RV size, PASP 47 mmHg, paradoxical low flow/low gradient severe AS (mean 25 mmHg, AVA 0.6 cm^2).    Admitted after TEE/LHC/RHC in 11/23 for aortic stenosis workup. TEE showed an abnormal trileaflet aortic valve with doming of the right coronary cusp. There was severe aortic stenosis with moderate aortic insufficiency. LHC showed nonobstructive CAD. RHC showed elevated right and left heart filling pressures with pulmonary venous hypertension. Admitted with acute HFpEF. Diuresed well with IV Lasix, weight down 16 lbs during admission. Home diuretics regimen adjusted with torsemide increased to 80 mg daily. She was referred to structural heart team for possible TAVR.    She saw [facility] who thought that she would be best-suited for TAVR.  She saw Dr Lynnette Caffey who was concerned about the position of her RCA and worried about disruption of the ostium of the vessel with TAVR.  He recommended weight loss and SAVR.  She agreed to start [facility]. Echo was repeated in 2/24, showing EF 55-60%, mild LVH, mild RV dysfunction, severe MR with AVA 0.8 cm^2 and mean gradient 33 mmHg.  Today she returns to HF clinic for  repeat injection of Amvuttra. Overall feeling ok today. No contraindications to injection. Injection administered in R arm as she still has pain in her left arm.    Assessment/Plan: 1. 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  EF 35-40% on TEE in 10/2013 but then EF back to 60-65% by 02/2014 and remained normal on echo in 12/2017 and in 02/2021.  LHC without significant coronary disease in 07/2013.  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   - Stable NYHA class II-III.  - Continue torsemide 80 mg daily + KCl 70 mEq daily. - Continue Toprol XL 100 mg BID. - Continue empagliflozin 10 mg daily.  2. 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  Atrial fibrillation and carpal tunnel syndrome, both of which she has, go along with amyloidosis.  Her history of orthostatic symptoms as well as symptoms in toes and feet consistent with peripheral neuropathy are also likely related to amyloidosis.  - Continue tafamidis.   - Amvuttra (vutrisiran) injection administered in clinic today. Patient tolerated injection well. Provided patient counseling on Amvuttra. Most common side effects are injection site reactions, arthralgias and dyspnea. Patient is aware to return to clinic every 3 months for repeat injection. Amvuttra will be obtained from [facility] and couriered to clinic for injection.  - Continue vitamin A supplement 8000 IU daily. Amvuttra decreases serum vitamin A levels. - She was seen by  Dr. 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 Given autosomal dominant condition, [name]'s daughter is at a 50% risk of inheriting the pathogenic variant in TTR (V142I). Genetic testing for the familial pathogenic variant was recommended but pt's daughter declined.   Follow up 3 months for repeat Amvuttra injection.   Karle Plumber, PharmD, BCPS, BCCP, CPP Heart Failure Clinic Pharmacist [phone]

## 2023-05-18 NOTE — Telephone Encounter (Signed)
[facility] Patient Advocate Encounter  Prior Authorization for Amvuttra has been approved.    PA# [id number] Effective dates: 05/19/22 through 05/18/24  Karle Plumber, PharmD, BCPS, BCCP, CPP Heart Failure Clinic Pharmacist [phone]

## 2023-05-18 NOTE — Patient Instructions (Signed)
It was a pleasure seeing you today!  MEDICATIONS: -No medication changes today -Call if you have questions about your medications.  NEXT APPOINTMENT: Return to clinic in March with Pharmacy Clinic. -Call [facility] at [phone] for refills on Mounjaro  In general, to take care of your heart failure: -Limit your fluid intake to 2 Liters (half-gallon) per day.   -Limit your salt intake to ideally 2-3 grams (2000-3000 mg) per day. -Weigh yourself daily and record, and bring that "weight diary" to your next appointment.  (Weight gain of 2-3 pounds in 1 day typically means fluid weight.) -The medications for your heart are to help your heart and help you live longer.   -Please contact us before stopping any of your heart medications.  Call the clinic at [phone] with questions or to reschedule future appointments.

## 2023-05-18 NOTE — Telephone Encounter (Signed)
Patient Advocate Encounter   Received notification from [facility] that prior authorization for Amvuttra is required.   PA submitted on CoverMyMeds Key [id number] Status is pending   Will continue to follow.   Karle Plumber, PharmD, BCPS, BCCP, CPP Heart Failure Clinic Pharmacist [phone]

## 2023-05-19 ENCOUNTER — Other Ambulatory Visit: Payer: Self-pay | Admitting: Internal Medicine

## 2023-05-19 ENCOUNTER — Other Ambulatory Visit: Payer: Self-pay | Admitting: Cardiology

## 2023-05-19 ENCOUNTER — Other Ambulatory Visit (HOSPITAL_COMMUNITY): Payer: Self-pay

## 2023-05-21 ENCOUNTER — Telehealth (HOSPITAL_COMMUNITY): Payer: Self-pay | Admitting: Pharmacy Technician

## 2023-05-21 ENCOUNTER — Other Ambulatory Visit (HOSPITAL_COMMUNITY): Payer: Self-pay

## 2023-05-21 NOTE — Telephone Encounter (Signed)
 Advanced Heart Failure Patient Advocate Encounter  Prior Authorization for Vyndaqel has been approved.    PA# [id number] Effective dates: 05/19/22 through 05/18/24  Archer Asa, CPhT

## 2023-06-02 ENCOUNTER — Other Ambulatory Visit: Payer: Self-pay

## 2023-06-02 NOTE — Progress Notes (Signed)
 Specialty Pharmacy Refill Coordination Note  Kimberly [name] is a 68 y.o. female contacted today regarding refills of specialty medication(s) Tafamidis  Meglumine  (Cardiac) (Vyndaqel )   Patient requested Delivery   Delivery date: 06/08/23   Verified address: Patient address [address]  [place] [address]   Medication will be filled on 01.19.25.

## 2023-06-04 ENCOUNTER — Ambulatory Visit (HOSPITAL_COMMUNITY): Payer: Medicare HMO | Attending: Internal Medicine

## 2023-06-04 DIAGNOSIS — I350 Nonrheumatic aortic (valve) stenosis: Secondary | ICD-10-CM | POA: Diagnosis not present

## 2023-06-04 LAB — ECHOCARDIOGRAM COMPLETE
AR max vel: 0.69 cm2
AV Area VTI: 0.76 cm2
AV Area mean vel: 0.71 cm2
AV Mean grad: 29 mm[Hg]
AV Peak grad: 50.4 mm[Hg]
Ao pk vel: 3.55 m/s
P 1/2 time: 856 ms
S' Lateral: 3.6 cm

## 2023-06-05 ENCOUNTER — Other Ambulatory Visit (HOSPITAL_COMMUNITY): Payer: Self-pay

## 2023-06-06 NOTE — Progress Notes (Unsigned)
Patient ID: Kimberly [name] MRN: [id number] DOB/AGE: [birth year] 68 y.o.  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 female with the indicated medical history here for recommendations regarding her severe aortic valvular disease.  The patient recently underwent an elective TEE to evaluate her aortic valve stenosis.  She was found to be volume overloaded at time and admitted for diuresis.  She lost 16 pounds with IV Lasix and was discharged home with a increased dose of torsemide.  The patient endorses NYHA class II symptoms of shortness of breath.  She feels however better than she did prior to the hospital.  She endorses stable two-pillow orthopnea.  She denies any peripheral edema.  She denies any exertional angina.  She denies any presyncope or syncope.  In terms of her dental health she has not seen a dentist in some time and fears that she might have teeth that need to be extracted.  She has problems with knee pain and so she is not very active.  She tells me when she walks upstairs she will get short of breath.  She has not had any shortness of breath at rest since discharge.  She occasionally does develop vertigo likely related to her schwannoma.  January 2024: In the  interim the patient was seen by cardiothoracic surgery and thought to be best served by a nonsurgical approach to treat her aortic stenosis.  We reviewed her imaging and she has a very effaced right sinus with a dominant right coronary artery which could be jeopardized.  She seems to have femoral access by CT scan with no high-grade obstructions.  The patient is here today and is doing relatively well.  She is able to do her activities of daily living without any significant shortness of breath.  Thing that limits her is knee pain.  She is able to walk up and down the [place] at a grocery store but has to stop because of knee pain.  She denies any paroxysmal nocturnal dyspnea, orthopnea, presyncope, or syncope.  She fortunately has not required any emergency room visits or hospitalizations.  Plan: Refer to pharmacy regarding Ozempic or Mounjaro, obtain echocardiogram, follow-up in 2 months.  March 2024: In the interim the patient had her semaglutide dose increased.  It does not look like she had a discussion with pharmacy regarding Mounjaro or [id number].  Since I saw her.  She denies any significantly increasing shortness of breath.  She does get lightheaded when she goes from laying to sitting in the morning.  She does not have any other episodes of presyncope or syncope.  She denies any chest pain and her chronic shortness of breath is relatively unchanged.  She is otherwise without significant cardiovascular complaints today.  Plan: Stressed that patient anatomy was suboptimal for transcatheter attic valve replacement given jeopardy of right coronary artery; stressed need to lose  weight to become a more optimal surgical candidate.  October 2024: There are interim the patient started [facility] for weight loss.  She has lost about 27 pounds.  Her Mounjaro dose was recently increased.  There was time when she was not able to get the medication may be over 1 or 2 weeks but this has been rectified.  She feels less  short of breath.   She is able to walk without having to stop as frequently as before.  She denies any exertional angina.  She has had some lightheadedness when going from sitting to standing.  She does have chronic lightheadedness with her schwannoma but this typically happens when she is lying down.  She fortunately has not required any emergency room visits or hospitalizations.  She generally feels well.  Plan: Continue current therapy and return for follow-up in 3 months with another echocardiogram.  1/25: In the interim the patient was seen by cardiology and had lost a total of 60 pounds since starting Mounjaro.  She is without cardiovascular complaints and her ambulation was limited by bilateral knee osteoarthritis.  An echocardiogram demonstrated paradoxical low-flow low gradient aortic stenosis.  The patient is doing very well.  She is lost close to 60 pounds since starting [place].  She denies any dyspnea.  She is able to walk much farther than she could before I met her.  She denies any chest pain.  She has had no presyncope or syncope.  Her biggest issue seems to be stemming from her known schwannoma.  She sometimes gets dizzy when she moves her head to the left when she is laying in bed.  This does not not occur when she is up and moving.  It also is associated with bothersome tinnitus.  She will be following up in [place] about this issue.  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   left    Past Surgical History:  Procedure Laterality Date   CARDIOVERSION N/A 07/29/2013   Procedure: CARDIOVERSION;  Surgeon: Lars Masson, MD;  Location: [facility]  ENDOSCOPY;  Service: Cardiovascular;  Laterality: N/A;   CARDIOVERSION N/A 11/11/2013   Procedure: CARDIOVERSION;  Surgeon: Laurey Morale, MD;  Location: [facility] ENDOSCOPY;  Service: Cardiovascular;  Laterality: N/A;   CARDIOVERSION N/A 12/08/2013   Procedure: CARDIOVERSION;  Surgeon: Laurey Morale, MD;  Location: [facility] ENDOSCOPY;  Service: Cardiovascular;  Laterality: N/A;   CARDIOVERSION N/A 06/30/2018   Procedure: CARDIOVERSION;  Surgeon: Laurey Morale, MD;  Location: [facility] ENDOSCOPY;  Service: Cardiovascular;  Laterality: N/A;   JOINT REPLACEMENT     LEFT HEART CATHETERIZATION WITH CORONARY ANGIOGRAM N/A 08/01/2013   Procedure: LEFT HEART CATHETERIZATION WITH CORONARY ANGIOGRAM;  Surgeon: Micheline Chapman, MD;  Location: [facility] CATH LAB;  Service: Cardiovascular;  Laterality: N/A;   RIGHT/LEFT HEART CATH AND CORONARY ANGIOGRAPHY N/A 04/14/2022   Procedure: RIGHT/LEFT HEART CATH AND CORONARY ANGIOGRAPHY;  Surgeon: Laurey Morale, MD;  Location: [facility] INVASIVE CV LAB;  Service: Cardiovascular;  Laterality: N/A;   TEE WITHOUT CARDIOVERSION N/A 07/29/2013   Procedure: TRANSESOPHAGEAL ECHOCARDIOGRAM (TEE);  Surgeon: Aris Lot  San Morelle, MD;  Location: [facility] ENDOSCOPY;  Service: Cardiovascular;  Laterality: N/A;   TEE WITHOUT CARDIOVERSION N/A 11/11/2013   Procedure: TRANSESOPHAGEAL ECHOCARDIOGRAM (TEE);  Surgeon: Laurey Morale, MD;  Location: [facility] ENDOSCOPY;  Service: Cardiovascular;  Laterality: N/A;   TEE WITHOUT CARDIOVERSION N/A 04/14/2022   Procedure: TRANSESOPHAGEAL ECHOCARDIOGRAM (TEE);  Surgeon: Laurey Morale, MD;  Location: [facility] ENDOSCOPY;  Service: Cardiovascular;  Laterality: N/A;    Family History  Problem Relation Age of Onset   CVA Mother     Social History   Socioeconomic History   Marital status: Single    Spouse name: Not on file   Number of children: Not on file   Years of education: Not on file   Highest education level: Not on file  Occupational History   Not on file  Tobacco Use    Smoking status: Never   Smokeless tobacco: Never  Vaping Use   Vaping status: Never Used  Substance and Sexual Activity   Alcohol use: No    Alcohol/week: 0.0 standard drinks of alcohol   Drug use: No   Sexual activity: Not Currently  Other Topics Concern   Not on file  Social History Narrative   Right handed    One story home alone   Caffeine 2 cups a day   Social Drivers of Health   Financial Resource Strain: Not on file  Food Insecurity: No Food Insecurity (04/14/2022)   Hunger Vital Sign    Worried About Running Out of Food in the Last Year: Never true    Ran Out of Food in the Last Year: Never true  Transportation Needs: No Transportation Needs (04/14/2022)   PRAPARE - Administrator, Civil Service (Medical): No    Lack of Transportation (Non-Medical): No  Physical Activity: Not on file  Stress: Not on file  Social Connections: Not on file  Intimate Partner Violence: Not At Risk (04/14/2022)   Humiliation, Afraid, Rape, and Kick questionnaire    Fear of Current or Ex-Partner: No    Emotionally Abused: No    Physically Abused: No    Sexually Abused: No     Prior to Admission medications   Medication Sig Start Date End Date Taking? Authorizing Provider  acetaminophen (TYLENOL) 650 MG CR tablet Take 1,300 mg by mouth every 8 (eight) hours as needed for pain.     [provider]  benzonatate (TESSALON) 100 MG capsule Take 100 mg by mouth 3 (three) times daily as needed. 04/01/21   [provider]  Cholecalciferol (VITAMIN D-3) 5000 UNITS TABS Take 5,000 Units by mouth daily.    [provider]  Docusate Sodium (DSS) 100 MG CAPS Take by mouth as needed.    [provider]  ELIQUIS 5 MG TABS tablet TAKE 1 TABLET (5 MG TOTAL) BY MOUTH 2 (TWO) TIMES DAILY. Patient taking differently: Take 5 mg by mouth 2 (two) times daily. 06/07/21   Laurey Morale, MD  ezetimibe (ZETIA) 10 MG tablet Take 0.5 tablets (5 mg total) by mouth  daily. Patient not taking: Reported on 04/14/2022 11/28/21 11/28/22  Allayne Butcher, PA-C  Ferrous Sulfate 27 MG TABS Take 27 mg by mouth 2 (two) times daily.     [provider]  fluticasone furoate-vilanterol (BREO ELLIPTA) 100-25 MCG/INH AEPB Inhale 1 puff into the lungs every morning. As needed 06/01/20   [provider]  JARDIANCE 10 MG TABS tablet TAKE 1 TABLET BY MOUTH DAILY BEFORE BREAKFAST. Patient  taking differently: Take 10 mg by mouth daily. 07/22/21   Milford, Anderson Malta, FNP  ketoconazole (NIZORAL) 2 % cream Apply 1 application topically as needed. 12/06/19   [provider]  metoprolol succinate (TOPROL-XL) 100 MG 24 hr tablet Take 1 tablet (100 mg total) by mouth 2 (two) times daily. 12/09/21   Laurey Morale, MD  nitroGLYCERIN (NITROSTAT) 0.4 MG SL tablet Place 1 tablet (0.4 mg total) under the tongue every 5 (five) minutes as needed for chest pain. 04/21/19   Laurey Morale, MD  Omega-3 1000 MG CAPS Take 1,000 mg by mouth every morning.    [provider]  potassium chloride (KLOR-CON) 10 MEQ tablet Take 5 tablets (50 mEq total) by mouth daily. 04/08/22   Laurey Morale, MD  RESTASIS 0.05 % ophthalmic emulsion Place 1 drop into both eyes daily as needed. 06/13/19   [provider]  rosuvastatin (CRESTOR) 40 MG tablet Take 1 tablet (40 mg total) by mouth at bedtime. 04/02/20   Laurey Morale, MD  Semaglutide (RYBELSUS) 3 MG TABS Take 1 tablet by mouth daily. 04/17/22   Laurey Morale, MD  Tafamidis Meglumine, Cardiac, (VYNDAQEL) 20 MG CAPS Take 4 capsules (80 mg)  by mouth daily. Patient taking differently: Take 40 mg by mouth in the morning and at bedtime. 10/02/21   Laurey Morale, MD  torsemide (DEMADEX) 20 MG tablet Take 4 tablets (80 mg total) by mouth daily. 04/16/22   Clegg, Amy D, NP    Allergies  Allergen Reactions   Tramadol Nausea And Vomiting   Spironolactone Itching    REVIEW OF SYSTEMS:  General: no  fevers/chills/night sweats Eyes: no blurry vision, diplopia, or amaurosis ENT: no sore throat or hearing loss Resp: no cough, wheezing, or hemoptysis CV: no edema or palpitations GI: no abdominal pain, nausea, vomiting, diarrhea, or constipation GU: no dysuria, frequency, or hematuria Skin: no rash Neuro: no headache, numbness, tingling, or weakness of extremities Musculoskeletal: no joint pain or swelling Heme: no bleeding, DVT, or easy bruising Endo: no polydipsia or polyuria  BP 118/74   Pulse 82   Ht 5\' 6"  (1.676 m)   Wt (!) 328 lb 12.8 oz (149.1 kg)   SpO2 94%   BMI 53.07 kg/m   PHYSICAL EXAM: GEN:  AO x 3 in no acute distress HEENT: normal Dentition: Normal Neck: JVP normal. +2carotid upstrokes without bruits. No thyromegaly. Lungs: equal expansion, clear bilaterally CV: Apex is discrete and nondisplaced, RRR with very distant heart sounds: I cannot hear a discrete murmur Abd: soft, non-tender, non-distended; no bruit; positive bowel sounds Ext: no edema, ecchymoses, or cyanosis Vascular: 2+ femoral pulses, 2+ radial pulses       Skin: warm and dry without rash Neuro: CN II-XII grossly intact; motor and sensory grossly intact    DATA AND STUDIES:  EKG: Atrial fibrillation without conduction disease  TTE:  February 2024  1. Left ventricular ejection fraction, by estimation, is 55 to 60%. The  left ventricle has normal function. The left ventricle demonstrates  regional wall motion abnormalities with possible basal inferior  hypokinesis. There is mild concentric left  ventricular hypertrophy. Left ventricular diastolic parameters are  indeterminate.   2. Right ventricular systolic function is mildly reduced. The right  ventricular size is mildly enlarged. Tricuspid regurgitation signal is  inadequate for assessing PA pressure.   3. Left atrial size was mildly dilated.   4. Right atrial size was mildly dilated.   5. The mitral  valve is normal in structure. Mild  mitral valve  regurgitation. No evidence of mitral stenosis.   6. The aortic valve is tricuspid. There is severe calcifcation of the  aortic valve. Aortic valve regurgitation is mild to moderate. Paradoxical  low flow/low gradient severe aortic valve stenosis. Aortic valve area, by  VTI measures 0.80 cm. Aortic valve   mean gradient measures 33.0 mmHg.   7. The inferior vena cava is dilated in size with >50% respiratory  variability, suggesting right atrial pressure of 8 mmHg.   8. The patient was in atrial fibrillation.   CARDIAC CATH: November 2023 1. Elevated right and left heart filling pressures.  2. Pulmonary venous hypertension. 3. Peak to peak aortic valve gradient 18 mmHg 4. Nonobstructive mild CAD.   STS RISK CALCULATOR: Pending  NHYA CLASS: 1    ASSESSMENT AND PLAN:   Aortic valve stenosis, etiology of cardiac valve disease unspecified - Plan: EKG 12-Lead, ECHOCARDIOGRAM COMPLETE  Mild CAD - Plan: EKG 12-Lead  PAF (paroxysmal atrial fibrillation) (HCC) - Plan: EKG 12-Lead  Secondary hypercoagulable state (HCC) - Plan: EKG 12-Lead  Cardiac amyloidosis (HCC) - Plan: EKG 12-Lead  BMI 50.0-59.9, adult (HCC) - Plan: EKG 12-Lead  Type 2 diabetes mellitus without complication, without long-term current use of insulin (HCC) - Plan: EKG 12-Lead  Hypertension associated with diabetes (HCC) - Plan: EKG 12-Lead  Hyperlipidemia associated with type 2 diabetes mellitus (HCC) - Plan: EKG 12-Lead  I was very excited to see that Kimberly [name] has lost a substantial amount of weight.  She tells me that her weight loss has occurred mostly in her abdomen and not so much in her hips or buttocks.  I only bring this up because if she continues to lose weight mostly from her abdomen I am not sure if this will change her candidacy for surgery.  She really has no indication for aortic valve intervention at this time from a symptom standpoint as she has asymptomatic paradoxical low-flow low  gradient aortic stenosis.  I am hopeful that she can continue to lose a sizable amount of weight and become a surgical candidate.  If not then she would require leaflet modification and TAVR.  I am very pleased with how she is doing today.  She will see Dr. Sherlie Ban in March.  I will see her back in 6 months with another echocardiogram.  I spent 40 minutes reviewing all clinical data during and prior to this visit including all relevant imaging studies, laboratories, clinical information from other health systems and prior notes from both Cardiology and other specialties, interviewing the patient, conducting a complete physical examination, and coordinating care in order to formulate a comprehensive and personalized evaluation and treatment plan.    Orbie Pyo, MD  06/09/2023 9:35 AM    [facility] [address] [place], [place], [place]  [address] Phone: [phone]; Fax: [phone]

## 2023-06-08 ENCOUNTER — Other Ambulatory Visit: Payer: Self-pay

## 2023-06-09 ENCOUNTER — Other Ambulatory Visit (HOSPITAL_COMMUNITY): Payer: Self-pay

## 2023-06-09 ENCOUNTER — Encounter: Payer: Self-pay | Admitting: Internal Medicine

## 2023-06-09 ENCOUNTER — Ambulatory Visit: Payer: Medicare HMO | Attending: Internal Medicine | Admitting: Internal Medicine

## 2023-06-09 VITALS — BP 118/74 | HR 82 | Ht 66.0 in | Wt 328.8 lb

## 2023-06-09 DIAGNOSIS — E854 Organ-limited amyloidosis: Secondary | ICD-10-CM

## 2023-06-09 DIAGNOSIS — E119 Type 2 diabetes mellitus without complications: Secondary | ICD-10-CM

## 2023-06-09 DIAGNOSIS — E1159 Type 2 diabetes mellitus with other circulatory complications: Secondary | ICD-10-CM

## 2023-06-09 DIAGNOSIS — D6869 Other thrombophilia: Secondary | ICD-10-CM

## 2023-06-09 DIAGNOSIS — E1169 Type 2 diabetes mellitus with other specified complication: Secondary | ICD-10-CM | POA: Diagnosis not present

## 2023-06-09 DIAGNOSIS — I2510 Atherosclerotic heart disease of native coronary artery without angina pectoris: Secondary | ICD-10-CM | POA: Diagnosis not present

## 2023-06-09 DIAGNOSIS — I350 Nonrheumatic aortic (valve) stenosis: Secondary | ICD-10-CM

## 2023-06-09 DIAGNOSIS — Z6841 Body Mass Index (BMI) 40.0 and over, adult: Secondary | ICD-10-CM

## 2023-06-09 DIAGNOSIS — E785 Hyperlipidemia, unspecified: Secondary | ICD-10-CM

## 2023-06-09 DIAGNOSIS — I43 Cardiomyopathy in diseases classified elsewhere: Secondary | ICD-10-CM

## 2023-06-09 DIAGNOSIS — I480 Paroxysmal atrial fibrillation: Secondary | ICD-10-CM

## 2023-06-09 DIAGNOSIS — I152 Hypertension secondary to endocrine disorders: Secondary | ICD-10-CM

## 2023-06-09 NOTE — Patient Instructions (Signed)
Medication Instructions:  Your physician recommends that you continue on your current medications as directed. Please refer to the Current Medication list given to you today.  *If you need a refill on your cardiac medications before your next appointment, please call your pharmacy*  Lab Work: None ordered today.  Testing/Procedures: Your physician has requested that you have an echocardiogram in 6 months (1-2 weeks prior to office visit). Echocardiography is a painless test that uses sound waves to create images of your heart. It provides your doctor with information about the size and shape of your heart and how well your heart's chambers and valves are working. This procedure takes approximately one hour. There are no restrictions for this procedure. Please do NOT wear cologne, perfume, aftershave, or lotions (deodorant is allowed). Please arrive 15 minutes prior to your appointment time.  Please note: We ask at that you not bring children with you during ultrasound (echo/ vascular) testing. Due to room size and safety concerns, children are not allowed in the ultrasound rooms during exams. Our front office staff cannot provide observation of children in our lobby area while testing is being conducted. An adult accompanying a patient to their appointment will only be allowed in the ultrasound room at the discretion of the ultrasound technician under special circumstances. We apologize for any inconvenience.   Follow-Up: At [facility], you and your health needs are our priority.  As part of our continuing mission to provide you with exceptional heart care, we have created designated Provider Care Teams.  These Care Teams include your primary Cardiologist (physician) and Advanced Practice Providers (APPs -  Physician Assistants and Nurse Practitioners) who all work together to provide you with the care you need, when you need it.  We recommend signing up for the patient portal called  "MyChart".  Sign up information is provided on this After Visit Summary.  MyChart is used to connect with patients for Virtual Visits (Telemedicine).  Patients are able to view lab/test results, encounter notes, upcoming appointments, etc.  Non-urgent messages can be sent to your provider as well.   To learn more about what you can do with MyChart, go to [email or web address].    Your next appointment:   6 month(s)  The format for your next appointment:   In Person  Provider:   Orbie Pyo, MD {

## 2023-06-11 DIAGNOSIS — M170 Bilateral primary osteoarthritis of knee: Secondary | ICD-10-CM | POA: Diagnosis not present

## 2023-06-11 DIAGNOSIS — D333 Benign neoplasm of cranial nerves: Secondary | ICD-10-CM | POA: Diagnosis not present

## 2023-06-11 DIAGNOSIS — Z923 Personal history of irradiation: Secondary | ICD-10-CM | POA: Diagnosis not present

## 2023-06-11 DIAGNOSIS — H9042 Sensorineural hearing loss, unilateral, left ear, with unrestricted hearing on the contralateral side: Secondary | ICD-10-CM | POA: Diagnosis not present

## 2023-06-11 DIAGNOSIS — R42 Dizziness and giddiness: Secondary | ICD-10-CM | POA: Diagnosis not present

## 2023-06-11 DIAGNOSIS — H9192 Unspecified hearing loss, left ear: Secondary | ICD-10-CM | POA: Diagnosis not present

## 2023-06-12 ENCOUNTER — Other Ambulatory Visit: Payer: Self-pay

## 2023-06-15 ENCOUNTER — Other Ambulatory Visit (HOSPITAL_COMMUNITY): Payer: Self-pay

## 2023-06-15 ENCOUNTER — Other Ambulatory Visit: Payer: Self-pay

## 2023-06-15 ENCOUNTER — Telehealth (HOSPITAL_COMMUNITY): Payer: Self-pay | Admitting: Pharmacy Technician

## 2023-06-15 NOTE — Progress Notes (Signed)
Kimberly [name] was expired but renewed today. Shipping today 1/27 patient aware.

## 2023-06-15 NOTE — Telephone Encounter (Signed)
Advanced Heart Failure Patient Advocate Encounter  The patient was approved for a [employer] grant that will help cover the cost of Vyndamax. Total amount awarded, $10,000. Eligibility, 05/16/23 - 05/14/24.  ID [id number]  BIN [id number]  PCN [id number]  Group [id number]  Billing information added to WAM.  Archer Asa, CPhT

## 2023-07-03 ENCOUNTER — Other Ambulatory Visit: Payer: Self-pay

## 2023-07-03 NOTE — Progress Notes (Signed)
Specialty Pharmacy Refill Coordination Note  Kimberly [name] is a 68 y.o. female contacted today regarding refills of specialty medication(s) [name] [name]   Patient requested Delivery   Delivery date: 07/14/23   Verified address: Patient address [address]  [place] [facility] [address]   Medication will be filled on 02.24.25.

## 2023-07-13 ENCOUNTER — Other Ambulatory Visit (HOSPITAL_COMMUNITY): Payer: Self-pay

## 2023-07-14 ENCOUNTER — Other Ambulatory Visit: Payer: Self-pay

## 2023-07-14 ENCOUNTER — Telehealth: Payer: Self-pay | Admitting: Internal Medicine

## 2023-07-14 ENCOUNTER — Other Ambulatory Visit (HOSPITAL_COMMUNITY): Payer: Self-pay

## 2023-07-14 NOTE — Telephone Encounter (Signed)
 Placed on Dr. Trula Ore cart for completion.

## 2023-07-14 NOTE — Telephone Encounter (Signed)
 Paper Work Dropped Off:  request for med consultation  Date: 07/14/2023  Location of paper:  Dr. Lynnette Caffey box

## 2023-07-16 ENCOUNTER — Other Ambulatory Visit (HOSPITAL_COMMUNITY): Payer: Self-pay | Admitting: Cardiology

## 2023-07-25 ENCOUNTER — Other Ambulatory Visit: Payer: Self-pay | Admitting: Internal Medicine

## 2023-07-30 ENCOUNTER — Other Ambulatory Visit: Payer: Self-pay

## 2023-07-30 ENCOUNTER — Other Ambulatory Visit (HOSPITAL_COMMUNITY): Payer: Self-pay | Admitting: Cardiology

## 2023-07-30 ENCOUNTER — Other Ambulatory Visit (HOSPITAL_COMMUNITY): Payer: Self-pay

## 2023-07-30 MED ORDER — AMVUTTRA 25 MG/0.5ML ~~LOC~~ SOSY
25.0000 mg | PREFILLED_SYRINGE | SUBCUTANEOUS | 3 refills | Status: AC
  Filled 2023-07-30: qty 0.5, 90d supply, fill #0
  Filled 2023-11-11: qty 0.5, 90d supply, fill #1
  Filled 2024-02-05: qty 0.5, 90d supply, fill #2
  Filled 2024-05-09: qty 0.5, 90d supply, fill #3

## 2023-07-30 MED ORDER — VYNDAQEL 20 MG PO CAPS
80.0000 mg | ORAL_CAPSULE | Freq: Every day | ORAL | 11 refills | Status: AC
  Filled 2023-07-30: qty 120, 30d supply, fill #0
  Filled 2023-09-04: qty 120, 30d supply, fill #1
  Filled 2023-09-29: qty 120, 30d supply, fill #2
  Filled 2023-10-30: qty 120, 30d supply, fill #3
  Filled 2023-11-26: qty 120, 30d supply, fill #4
  Filled 2023-12-31: qty 120, 30d supply, fill #5
  Filled 2024-01-27: qty 120, 30d supply, fill #6
  Filled 2024-02-24: qty 120, 30d supply, fill #7
  Filled 2024-03-23: qty 120, 30d supply, fill #8
  Filled 2024-04-21: qty 120, 30d supply, fill #9
  Filled 2024-05-17 – 2024-05-23 (×3): qty 120, 30d supply, fill #10

## 2023-07-30 NOTE — Progress Notes (Signed)
 Specialty Pharmacy Refill Coordination Note  Kimberly [name] is a 68 y.o. female contacted today regarding refills of specialty medication(s) [name] [name]   Patient requested Delivery   Delivery date: 08/11/23   Verified address: [address]   [place] [address]   Medication will be filled on 08/10/23.   This fill date is pending response to refill request from provider. Patient is aware and if they have not received fill by intended date, they must follow up with pharmacy.

## 2023-07-30 NOTE — Progress Notes (Signed)
 Specialty Pharmacy Refill Coordination Note  Kimberly [name] is a 68 y.o. female contacted today regarding refills of specialty medication(s) Vutrisiran Sodium (Amvuttra)   Patient requested Delivery   Delivery date: 08/11/23   Verified address: [facility] [address]   [place] [address]   Medication will be filled on 08/10/23.

## 2023-07-30 NOTE — Telephone Encounter (Signed)
 Form completed and faxed on 07/15/2023.                    -------Fax Transmission Report-------  To:               Recipient at [phone] Subject:          Fw: Send data from [id number] 07/15/2023 18:13 Result:           The transmission was successful. Explanation:      All Pages Ok Pages Sent:       3 Connect Time:     2 minutes, 0 seconds Transmit Time:    07/15/2023 17:48 Transfer Rate:    [id number] Status Code:      0000 Retry Count:      0 Job Id:           [id number] Unique Id:        [id number] Fax Line:         6 Fax Server:       [employer]

## 2023-08-03 ENCOUNTER — Other Ambulatory Visit (HOSPITAL_COMMUNITY): Payer: Self-pay

## 2023-08-05 DIAGNOSIS — R5383 Other fatigue: Secondary | ICD-10-CM | POA: Diagnosis not present

## 2023-08-05 DIAGNOSIS — E559 Vitamin D deficiency, unspecified: Secondary | ICD-10-CM | POA: Diagnosis not present

## 2023-08-05 DIAGNOSIS — R300 Dysuria: Secondary | ICD-10-CM | POA: Diagnosis not present

## 2023-08-05 DIAGNOSIS — E7800 Pure hypercholesterolemia, unspecified: Secondary | ICD-10-CM | POA: Diagnosis not present

## 2023-08-05 DIAGNOSIS — R0602 Shortness of breath: Secondary | ICD-10-CM | POA: Diagnosis not present

## 2023-08-05 DIAGNOSIS — Z79899 Other long term (current) drug therapy: Secondary | ICD-10-CM | POA: Diagnosis not present

## 2023-08-05 DIAGNOSIS — E538 Deficiency of other specified B group vitamins: Secondary | ICD-10-CM | POA: Diagnosis not present

## 2023-08-05 DIAGNOSIS — D539 Nutritional anemia, unspecified: Secondary | ICD-10-CM | POA: Diagnosis not present

## 2023-08-10 ENCOUNTER — Other Ambulatory Visit: Payer: Self-pay

## 2023-08-10 ENCOUNTER — Other Ambulatory Visit (HOSPITAL_COMMUNITY): Payer: Self-pay

## 2023-08-14 ENCOUNTER — Other Ambulatory Visit (HOSPITAL_COMMUNITY): Payer: Self-pay

## 2023-08-17 ENCOUNTER — Other Ambulatory Visit (HOSPITAL_COMMUNITY): Payer: Self-pay

## 2023-08-17 ENCOUNTER — Ambulatory Visit (HOSPITAL_COMMUNITY)
Admission: RE | Admit: 2023-08-17 | Discharge: 2023-08-17 | Disposition: A | Discharge status: Home / Self Care | Payer: Medicare HMO | Source: Ambulatory Visit | Attending: Internal Medicine | Admitting: Internal Medicine

## 2023-08-17 ENCOUNTER — Other Ambulatory Visit: Payer: Self-pay

## 2023-08-17 ENCOUNTER — Telehealth: Payer: Self-pay | Admitting: Internal Medicine

## 2023-08-17 DIAGNOSIS — E669 Obesity, unspecified: Secondary | ICD-10-CM | POA: Diagnosis not present

## 2023-08-17 DIAGNOSIS — I2510 Atherosclerotic heart disease of native coronary artery without angina pectoris: Secondary | ICD-10-CM | POA: Insufficient documentation

## 2023-08-17 DIAGNOSIS — I43 Cardiomyopathy in diseases classified elsewhere: Secondary | ICD-10-CM | POA: Insufficient documentation

## 2023-08-17 DIAGNOSIS — I480 Paroxysmal atrial fibrillation: Secondary | ICD-10-CM | POA: Diagnosis not present

## 2023-08-17 DIAGNOSIS — I428 Other cardiomyopathies: Secondary | ICD-10-CM | POA: Diagnosis not present

## 2023-08-17 DIAGNOSIS — G63 Polyneuropathy in diseases classified elsewhere: Secondary | ICD-10-CM

## 2023-08-17 DIAGNOSIS — E851 Neuropathic heredofamilial amyloidosis: Secondary | ICD-10-CM | POA: Diagnosis not present

## 2023-08-17 DIAGNOSIS — Z79899 Other long term (current) drug therapy: Secondary | ICD-10-CM | POA: Diagnosis not present

## 2023-08-17 DIAGNOSIS — E854 Organ-limited amyloidosis: Secondary | ICD-10-CM | POA: Diagnosis not present

## 2023-08-17 DIAGNOSIS — I5042 Chronic combined systolic (congestive) and diastolic (congestive) heart failure: Secondary | ICD-10-CM | POA: Diagnosis not present

## 2023-08-17 MED ORDER — VUTRISIRAN SODIUM 25 MG/0.5ML ~~LOC~~ SOSY
25.0000 mg | PREFILLED_SYRINGE | Freq: Once | SUBCUTANEOUS | Status: AC
  Administered 2023-08-17: 25 mg via SUBCUTANEOUS

## 2023-08-17 NOTE — Telephone Encounter (Signed)
 Please assist with request! Dr. Lynnette Caffey appears to have signed that. Not sure if the routing button would work here since it's an attachment. Thank you!

## 2023-08-17 NOTE — Telephone Encounter (Signed)
 There is a dental clearance scanned under Media on 08/11/23. Per pt dentist office has not received. Please re-fax asap.

## 2023-08-17 NOTE — Progress Notes (Signed)
 Advanced Heart Failure Clinic Note   PCP:  Kimberly Lent, PA-C  HF Cardiologist:  Dr. Shirlee [name]  HPI:  [name] is a 68 y.o. female who has a history of paroxysmal atrial fibrillation, obesity, and nonischemic cardiomyopathy.  She was admitted in 07/2013 at [name] with atrial fibrillation and CHF.  TEE was done in preparation for DCCV, showing EF 25-30% but there was LA thrombus so DCCV was not done.  Plan was for anticoagulation x 3 months followed by TEE-guided DCCV in the OR (due to patient's body habitus).  LHC was also done, showing mild nonobstructive CAD.  Patient was diuresed with IV Lasix in the [name] and started on apixaban.    Patient had TEE again in 10/2013, showing EF 35-40% with diffuse hypokinesis and no LAA thrombus.  She was cardioverted to NSR (with difficulty).  Was started on amiodarone.  She then went back into atrial fibrillation.  After she had been loaded on amiodarone, cardioverted her again in 11/2013 to NSR.     She had gone back into atrial fibrillation in 05/2016 and amiodarone was increased to BID in preparation for DCCV. However, she spontaneously converted to NSR.   PYP scan in 08/2017 was grade 3, strongly suggestive of TTR amyloidosis.  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  Echo 12/2017 showed EF 60%, moderate LVH, mild aortic stenosis.  Of note, she has carpal tunnel syndrome.  She was started on tafamidis.    She went back into atrial fibrillation and had DCCV in 06/2018 but was back in atrial fibrillation a few weeks later when she went to atrial fibrillation clinic.  Dr. Johney [name] thought that morbid obesity precluded atrial fibrillation ablation and concerns about compliance led them not to start her on Tikosyn.  Instead, it was recommended that she start on CPAP for her OSA, lose weight, and re-attempt DCCV while on amiodarone.    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  In 7/21, she developed hematochezia and had EGD/colonoscopy which were unrevealing except for internal hemorrhoids.    Echo in 02/2021 showed EF 60-65%, normal RV, moderate AS mean gradient 20 mmHg with AVA 1.56 cm^2, mild AI.    Echo 04/08/22  EF 55-60%, mild LVH, mildly decreased RV systolic function with normal RV size, PASP 47 mmHg, paradoxical low flow/low gradient severe AS (mean 25 mmHg, AVA 0.6 cm^2).    Admitted after TEE/LHC/RHC in 11/23 for aortic stenosis workup. TEE showed an abnormal trileaflet aortic valve with doming of the right coronary cusp. There was severe aortic stenosis with moderate aortic insufficiency. LHC showed nonobstructive CAD. RHC showed elevated right and left heart filling pressures with pulmonary venous hypertension. Admitted with acute HFpEF. Diuresed well with IV Lasix, weight down 16 lbs during admission. Home diuretics regimen adjusted with torsemide increased to 80 mg daily. She was referred to structural heart team for possible TAVR.    She saw [name] who thought that she would be best-suited for TAVR.  She saw Dr [name] who was concerned about the position of her RCA and worried about disruption of the ostium of the vessel with TAVR.  He recommended weight loss and SAVR.  She agreed to start [name]. Echo was repeated in 2/24, showing EF 55-60%, mild LVH, mild RV dysfunction, severe MR with AVA 0.8 cm^2 and mean gradient 33 mmHg.  Today she returns to HF clinic for  repeat injection of Amvuttra. Overall feeling ok today. No contraindications to injection. Injection administered in R arm.    Assessment/Plan: 1. 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  EF 35-40% on TEE in 10/2013 but then EF back to 60-65% by 02/2014 and remained normal on echo in 12/2017 and in 02/2021.  LHC without significant coronary disease in 07/2013.  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   - Stable NYHA class II-III.  - Continue torsemide 80 mg daily + KCl 70 mEq daily. - Continue Toprol XL 100 mg BID. - Continue empagliflozin 10 mg daily.  2. 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  Atrial fibrillation and carpal tunnel syndrome, both of which she has, go along with amyloidosis.  Her history of orthostatic symptoms as well as symptoms in toes and feet consistent with peripheral neuropathy are also likely related to amyloidosis.  - Continue tafamidis.   - Amvuttra (vutrisiran) injection administered in clinic today. Patient tolerated injection well. Provided patient counseling on Amvuttra. Most common side effects are injection site reactions, arthralgias and dyspnea. Patient is aware to return to clinic every 3 months for repeat injection. Amvuttra will be obtained from [facility] and couriered to clinic for injection.  - Continue vitamin A supplement 8000 IU daily. Amvuttra decreases serum vitamin A levels. - She was seen by  Dr. 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 Given autosomal dominant condition, [name]'s daughter is at a 50% risk of inheriting the pathogenic variant in TTR (V142I). Genetic testing for the familial pathogenic variant was recommended but pt's daughter declined.   Follow up 3 months for repeat Amvuttra injection.   Karle Plumber, PharmD, BCPS, BCCP, CPP Heart Failure Clinic Pharmacist [phone]

## 2023-08-17 NOTE — Patient Instructions (Signed)
 It was a pleasure seeing you today!  MEDICATIONS: -No medication changes today -Call if you have questions about your medications.   NEXT APPOINTMENT: Return to clinic in 3 months for repeat Amvuttra injection.  In general, to take care of your heart failure: -Limit your fluid intake to 2 Liters (half-gallon) per day.   -Limit your salt intake to ideally 2-3 grams (2000-3000 mg) per day. -Weigh yourself daily and record, and bring that "weight diary" to your next appointment.  (Weight gain of 2-3 pounds in 1 day typically means fluid weight.) -The medications for your heart are to help your heart and help you live longer.   -Please contact us before stopping any of your heart medications.  Call the clinic at [phone] with questions or to reschedule future appointments.

## 2023-08-18 NOTE — Telephone Encounter (Signed)
 Faxed over document in Media (surgical clearance) to fax # on form.

## 2023-08-25 DIAGNOSIS — Z86718 Personal history of other venous thrombosis and embolism: Secondary | ICD-10-CM | POA: Diagnosis not present

## 2023-08-25 DIAGNOSIS — H6121 Impacted cerumen, right ear: Secondary | ICD-10-CM | POA: Diagnosis not present

## 2023-08-25 DIAGNOSIS — H90A22 Sensorineural hearing loss, unilateral, left ear, with restricted hearing on the contralateral side: Secondary | ICD-10-CM | POA: Diagnosis not present

## 2023-08-25 DIAGNOSIS — Z86018 Personal history of other benign neoplasm: Secondary | ICD-10-CM | POA: Diagnosis not present

## 2023-08-25 DIAGNOSIS — Z923 Personal history of irradiation: Secondary | ICD-10-CM | POA: Diagnosis not present

## 2023-08-25 DIAGNOSIS — H9042 Sensorineural hearing loss, unilateral, left ear, with unrestricted hearing on the contralateral side: Secondary | ICD-10-CM | POA: Diagnosis not present

## 2023-08-25 DIAGNOSIS — Z9889 Other specified postprocedural states: Secondary | ICD-10-CM | POA: Diagnosis not present

## 2023-08-25 DIAGNOSIS — H9313 Tinnitus, bilateral: Secondary | ICD-10-CM | POA: Diagnosis not present

## 2023-08-25 DIAGNOSIS — D333 Benign neoplasm of cranial nerves: Secondary | ICD-10-CM | POA: Diagnosis not present

## 2023-08-25 DIAGNOSIS — Z974 Presence of external hearing-aid: Secondary | ICD-10-CM | POA: Diagnosis not present

## 2023-08-25 DIAGNOSIS — H9312 Tinnitus, left ear: Secondary | ICD-10-CM | POA: Diagnosis not present

## 2023-08-25 DIAGNOSIS — Z0110 Encounter for examination of ears and hearing without abnormal findings: Secondary | ICD-10-CM | POA: Diagnosis not present

## 2023-08-25 DIAGNOSIS — R42 Dizziness and giddiness: Secondary | ICD-10-CM | POA: Diagnosis not present

## 2023-09-03 ENCOUNTER — Telehealth: Payer: Self-pay | Admitting: *Deleted

## 2023-09-03 NOTE — Telephone Encounter (Signed)
 Left message for the pt that we faxed again the clearance form [employer]. Dr. Lorie Rook signed the form the [facility] office faxed on 07/13/23.

## 2023-09-03 NOTE — Telephone Encounter (Signed)
 Patient was calling back about her medical clearance

## 2023-09-04 ENCOUNTER — Other Ambulatory Visit: Payer: Self-pay

## 2023-09-04 ENCOUNTER — Other Ambulatory Visit (HOSPITAL_COMMUNITY): Payer: Self-pay | Admitting: Cardiology

## 2023-09-04 NOTE — Progress Notes (Signed)
 Specialty Pharmacy Refill Coordination Note  Andreia Gandolfi is a 68 y.o. female contacted today regarding refills of specialty medication(s) Tafamidis  Meglumine  (Cardiac) (Vyndaqel )   Patient requested Delivery   Delivery date: 09/08/23   Verified address: [address]   [place] [address]   Medication will be filled on 09/07/23.

## 2023-09-07 ENCOUNTER — Other Ambulatory Visit: Payer: Self-pay

## 2023-09-08 NOTE — Telephone Encounter (Signed)
 Patient is returning call.

## 2023-09-08 NOTE — Telephone Encounter (Signed)
 Spoke with patient aware of Surgeon office receiving clearance ( letter in media)

## 2023-09-14 ENCOUNTER — Other Ambulatory Visit (HOSPITAL_COMMUNITY): Payer: Self-pay

## 2023-09-14 ENCOUNTER — Other Ambulatory Visit: Payer: Self-pay

## 2023-09-14 ENCOUNTER — Other Ambulatory Visit: Payer: Self-pay | Admitting: Internal Medicine

## 2023-09-14 DIAGNOSIS — E119 Type 2 diabetes mellitus without complications: Secondary | ICD-10-CM

## 2023-09-14 MED ORDER — MOUNJARO 15 MG/0.5ML ~~LOC~~ SOAJ
15.0000 mg | SUBCUTANEOUS | 11 refills | Status: AC
  Filled 2023-09-14: qty 2, 28d supply, fill #0
  Filled 2023-10-15: qty 2, 28d supply, fill #1
  Filled 2023-11-23: qty 2, 28d supply, fill #2
  Filled 2023-12-21: qty 2, 28d supply, fill #3
  Filled 2024-01-26: qty 2, 28d supply, fill #4
  Filled 2024-02-19: qty 2, 28d supply, fill #5
  Filled 2024-03-24: qty 2, 28d supply, fill #6
  Filled 2024-05-30: qty 2, 28d supply, fill #7

## 2023-09-16 ENCOUNTER — Other Ambulatory Visit (HOSPITAL_COMMUNITY): Payer: Self-pay

## 2023-09-29 ENCOUNTER — Other Ambulatory Visit (HOSPITAL_COMMUNITY): Payer: Self-pay

## 2023-09-29 NOTE — Progress Notes (Signed)
 Specialty Pharmacy Ongoing Clinical Assessment Note  [name] is a 68 y.o. female who is being followed by the specialty pharmacy service for RxSp Cardiology   Patient's specialty medication(s) reviewed today: Tafamidis  Meglumine  (Cardiac) (Vyndaqel ); Vutrisiran  Sodium (Amvuttra )   Missed doses in the last 4 weeks: 0   Patient/Caregiver did not have any additional questions or concerns.   Therapeutic benefit summary: Patient is achieving benefit   Adverse events/side effects summary: No adverse events/side effects   Patient's therapy is appropriate to: Continue    Goals Addressed             This Visit's Progress    Stabilization of disease   On track    Patient is on track. Patient will maintain adherence         Follow up: 6 months  Malachi Screws Specialty Pharmacist

## 2023-09-29 NOTE — Progress Notes (Signed)
 Specialty Pharmacy Refill Coordination Note  [name] is a 68 y.o. female contacted today regarding refills of specialty medication(s) Tafamidis  Meglumine  (Cardiac) (Vyndaqel ); Vutrisiran  Sodium (Amvuttra )   Patient requested Delivery   Delivery date: 10/06/23   Verified address: [address]   [place] [address]   Medication will be filled on 10/05/23.

## 2023-09-30 ENCOUNTER — Other Ambulatory Visit (HOSPITAL_COMMUNITY): Payer: Self-pay | Admitting: Family Medicine

## 2023-10-15 ENCOUNTER — Other Ambulatory Visit (HOSPITAL_COMMUNITY): Payer: Self-pay

## 2023-10-26 ENCOUNTER — Telehealth (HOSPITAL_COMMUNITY): Payer: Self-pay

## 2023-10-26 NOTE — Telephone Encounter (Signed)
 Called to confirm/remind patient of their appointment at the Advanced Heart Failure Clinic on 10/27/23.   Appointment:   [] Confirmed  [x] Left mess   [] No answer/No voice mail  [] VM Full/unable to leave message  [] Phone not in service  And to bring in all medications and/or complete list.

## 2023-10-27 ENCOUNTER — Encounter (HOSPITAL_COMMUNITY): Payer: Self-pay

## 2023-10-27 ENCOUNTER — Ambulatory Visit (HOSPITAL_COMMUNITY)
Admission: RE | Admit: 2023-10-27 | Discharge: 2023-10-27 | Disposition: A | Discharge status: Home / Self Care | Source: Ambulatory Visit | Attending: Adult Health | Admitting: Adult Health

## 2023-10-27 VITALS — BP 118/74 | HR 98 | Ht 66.5 in | Wt 321.0 lb

## 2023-10-27 DIAGNOSIS — I110 Hypertensive heart disease with heart failure: Secondary | ICD-10-CM | POA: Diagnosis not present

## 2023-10-27 DIAGNOSIS — G4733 Obstructive sleep apnea (adult) (pediatric): Secondary | ICD-10-CM | POA: Insufficient documentation

## 2023-10-27 DIAGNOSIS — I350 Nonrheumatic aortic (valve) stenosis: Secondary | ICD-10-CM | POA: Diagnosis not present

## 2023-10-27 DIAGNOSIS — I480 Paroxysmal atrial fibrillation: Secondary | ICD-10-CM | POA: Diagnosis present

## 2023-10-27 DIAGNOSIS — Z79899 Other long term (current) drug therapy: Secondary | ICD-10-CM | POA: Insufficient documentation

## 2023-10-27 DIAGNOSIS — I482 Chronic atrial fibrillation, unspecified: Secondary | ICD-10-CM | POA: Insufficient documentation

## 2023-10-27 DIAGNOSIS — E854 Organ-limited amyloidosis: Secondary | ICD-10-CM | POA: Diagnosis not present

## 2023-10-27 DIAGNOSIS — Z7901 Long term (current) use of anticoagulants: Secondary | ICD-10-CM | POA: Insufficient documentation

## 2023-10-27 DIAGNOSIS — Z6841 Body Mass Index (BMI) 40.0 and over, adult: Secondary | ICD-10-CM | POA: Insufficient documentation

## 2023-10-27 DIAGNOSIS — I5022 Chronic systolic (congestive) heart failure: Secondary | ICD-10-CM | POA: Insufficient documentation

## 2023-10-27 DIAGNOSIS — I2510 Atherosclerotic heart disease of native coronary artery without angina pectoris: Secondary | ICD-10-CM | POA: Diagnosis not present

## 2023-10-27 DIAGNOSIS — I5032 Chronic diastolic (congestive) heart failure: Secondary | ICD-10-CM | POA: Diagnosis present

## 2023-10-27 DIAGNOSIS — I428 Other cardiomyopathies: Secondary | ICD-10-CM | POA: Insufficient documentation

## 2023-10-27 DIAGNOSIS — G5603 Carpal tunnel syndrome, bilateral upper limbs: Secondary | ICD-10-CM | POA: Insufficient documentation

## 2023-10-27 DIAGNOSIS — I43 Cardiomyopathy in diseases classified elsewhere: Secondary | ICD-10-CM | POA: Insufficient documentation

## 2023-10-27 LAB — BASIC METABOLIC PANEL WITH GFR
Anion gap: 14 (ref 5–15)
BUN: 16 mg/dL (ref 8–23)
CO2: 27 mmol/L (ref 22–32)
Calcium: 9.6 mg/dL (ref 8.9–10.3)
Chloride: 100 mmol/L (ref 98–111)
Creatinine, Ser: 1.19 mg/dL — ABNORMAL HIGH (ref 0.44–1.00)
GFR, Estimated: 50 mL/min — ABNORMAL LOW (ref >60)
Glucose, Bld: 86 mg/dL (ref 70–99)
Potassium: 4.1 mmol/L (ref 3.5–5.1)
Sodium: 141 mmol/L (ref 135–145)

## 2023-10-27 NOTE — Progress Notes (Signed)
 Date:  10/27/2023   ID:  [name], DOB 09-08-1955, MRN [id number]   Provider location: [place] [facility] Failure Type of Visit: Established patient   PCP:  Kimberly Panda, PA-C  HF Cardiologist:  Dr. Mitzie [name] Structural Heart: Dr [name]   History of Present Illness: [name] is a 68 y.o. female who has a history of paroxysmal atrial fibrillation, obesity, and nonischemic cardiomyopathy.  She was admitted in 3/15 at [facility] with atrial fibrillation and CHF.  TEE was done in preparation for DCCV, showing EF 25-30% but there was LA thrombus so DCCV was not done.  Plan was for anticoagulation x 3 months followed by TEE-guided DCCV in the OR (due to patient's body habitus).  LHC was also done, showing mild nonobstructive CAD.  Patient was diuresed with IV Lasix  in the hospital and started on apixaban .    Patient had TEE again in 6/15, showing EF 35-40% with diffuse hypokinesis and no LAA thrombus.  She was cardioverted to NSR (with difficulty).  I started her at that time on amiodarone .  She then went back into atrial fibrillation.  After she had been loaded on amiodarone , cardioverted her again in 7/15 to NSR.     She had gone back into atrial fibrillation in 1/18 and amiodarone  was increased to bid in preparation for DCCV. However, she spontaneously converted to NSR.   PYP scan in 4/19 was grade 3, strongly suggestive of TTR amyloidosis.  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  Echo 8/19 showed EF 60%, moderate LVH, mild aortic stenosis.  Of note, she has carpal tunnel syndrome.  She was started on tafamidis .    She went back into atrial fibrillation and had DCCV in 2/20 but was back in atrial fibrillation a few weeks later when she went to atrial fibrillation clinic.  Dr. Nunzio [name] thought that morbid obesity precluded atrial fibrillation ablation and concerns about compliance led them not to start her on Tikosyn.  Instead, it was recommended  that she start on CPAP for her OSA, lose weight, and re-attempt DCCV while on amiodarone .   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  In 7/21, she developed hematochezia and had EGD/colonoscopy which were unrevealing except for internal hemorrhoids.   Echo in 10/22 showed EF 60-65%, normal RV, moderate AS mean gradient 20 mmHg with AVA 1.56 cm^2, mild AI.   Echo 04/08/22  EF 55-60%, mild LVH, mildly decreased RV systolic function with normal RV size, PASP 47 mmHg, paradoxical low flow/low gradient severe AS (mean 25 mmHg, AVA 0.6 cm^2).   Admitted after TEE/LHC/RHC in 11/23 for aortic stenosis workup. TEE showed an abnormal trileaflet aortic valve with doming of the right coronary cusp. There was severe aortic stenosis with moderate aortic insufficiency. LHC showed nonobstructive CAD. RHC showed elevated right and left heart filling pressures with pulmonary venous hypertension. Admitted with acute HFpEF. Diuresed well with IV lasix , weight down 16 lbs during admission. Home diuretics regimen adjusted with torsemide  increased to 80 mg daily. She was referred to structural heart team for possible TAVR.   She saw Dr. Narciso [name] who thought that she would be best-suited for TAVR.  She saw Dr [name] who was concerned about the position of her RCA and worried about disruption of the ostium of the vessel with TAVR.  He recommended weight loss and SAVR.  She agreed to start Mounjaro .  Echo was repeated in 2/24, showing EF 55-60%, mild LVH, mild RV dysfunction, severe AS with AVA 0.8 cm^2 and mean gradient 33 mmHg, mild-moderate AI.  Echo 9/24 showed EF 60-65%, normal RV, severe aortic stenosis, calcified aortic valve with severe AS (mean gradient 38 mmHg, AVA 0.75 cm2)  Echo 06/04/2023 EF 60-65% RV normal Severe AS  Today she returns for HF follow up.Overall feeling better. Every now and then gets dizzy. SOB with inclines.  Limited by knee pain. Denies PND/Orthopnea. Appetite ok. No fever or chills. Weight at home trending 317 pounds. Taking all medications.   PMH: 1. Obese 2. Atrial fibrillation: Paroxysmal => chronic.  Unable to cardiovert in 3/15 due to LAA thrombus noted on TEE. TEE in 6/15 without LAA thrombus.  Patient had cardioversion to NSR with difficulty but back in atrial fibrillation by 7/15 appt.  She was started on amiodarone  and cardioverted in 7/15.   - DCCV in 2/20 but back in atrial fibrillation by 3/20, has been in atrial fibrillation since that time.  3. H/o TKR 4. Nonischemic cardiomyopathy: TEE (3/15) with EF 25-30%, moderate MR, PA systolic pressure 51 mmHg, moderate to severe TR, LA thrombus was noted.   LHC (3/15) with 50% stenosis small PLV.  TEE (6/15) with EF 35-40%, diffuse hypokinesis, mild MR, moderate TR, +PFO, no LA appendage thrombus.  - Echo (10/15): with EF 60-65%.   - Echo (1/18): EF 60-65% Mild AS - Echo (8/19): EF 60%, moderate LVH, mild aortic stenosis.  - PYP scan in 4/19 was grade 3, strongly suggestive of transthyretin amyloidosis.  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  - Echo (11/21): EF 55-60%, mild LV dilation, RV normal, mild-moderate AS with mean gradient 17 mmHg and AVA 1.07 cm^2.  - Echo (10/22): EF 60-65%, normal RV, moderate AS mean gradient 20 mmHg with AVA 1.56 cm^2, mild AI.  - Echo (11/23): EF 55-60%, mild LVH, mildly decreased RV systolic function with normal RV size, PASP 47 mmHg, paradoxical low flow/low gradient severe AS (mean 25 mmHg, AVA 0.6 cm^2). - TEE 04/14/22: EF 55-60%, RV okay, moderate LAE, moderate AI, severe AS with mean gradient of 40 mmHg, AVA by VTI 0.86 cm2 and 0.71 cm2 by planimetry - RHC (11/23): mean RA 16, PA 49/31 mean 37, mean PCWP 26, CI 2.47, PVR 1.7 WU.  - Echo (2/24): EF 55-60%, mild LVH, mild RV dysfunction, severe AS with AVA 0.8 cm^2 and mean gradient 33 mmHg, mild-moderate AI. - Echo (9/24): EF 60-65%, normal RV, severe  aortic stenosis, calcified aortic valve with severe AS (mean gradient 38 mmHg, AVA 0.75 cm2) 5. CAD: Nonobstructive.  LHC (3/15) with 50% stenosis small PLV. - LHC 04/14/22: 40% stenosis distal LAD. 6. PFO 7. Left Breast mass: Was taken for lumpectomy in 1/18 but mass had resolved.  8. Aortic stenosis: Moderate on 10/22 echo.  - Paradoxical low flow/low gradient severe AS on 11/23 echo.  9. Carpal tunnel syndrome.  10. Vestibular schwannoma s/p gamma knife surgery at [facility]  11. OSA: Not currently using CPAP.  12. Hematochezia: C-scope/EGD in 7/21 showed only internal hemorrhoids.  13. 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   14. Aortic stenosis: Severe AS by TEE in 11/23 and echo in 2/24.   Current Outpatient Medications  Medication Sig Dispense Refill   acetaminophen  (TYLENOL ) 650 MG CR tablet Take 1,300 mg by mouth every 8 (eight) hours as needed for pain.      apixaban  (ELIQUIS ) 5 MG TABS tablet TAKE 1 TABLET BY  MOUTH TWICE A DAY 60 tablet 11   benzonatate (TESSALON) 100 MG capsule Take 100 mg by mouth 3 (three) times daily as needed.     Cholecalciferol  (VITAMIN D -3) 5000 UNITS TABS Take 5,000 Units by mouth daily.     Docusate Sodium (DSS) 100 MG CAPS Take by mouth as needed.     empagliflozin  (JARDIANCE ) 10 MG TABS tablet TAKE 1 TABLET BY MOUTH EVERY DAY BEFORE BREAKFAST 90 tablet 0   ezetimibe  (ZETIA ) 10 MG tablet TAKE 1/2 TABLET BY MOUTH DAILY 45 tablet 3   Ferrous Sulfate  27 MG TABS Take 27 mg by mouth 2 (two) times daily.      fluticasone furoate-vilanterol (BREO ELLIPTA) 100-25 MCG/INH AEPB Inhale 1 puff into the lungs every morning. As needed     ketoconazole (NIZORAL) 2 % cream Apply 1 application topically as needed.     metoprolol  succinate (TOPROL -XL) 100 MG 24 hr tablet TAKE 1 TABLET BY MOUTH TWICE A DAY 180 tablet 3   nitroGLYCERIN  (NITROSTAT ) 0.4 MG SL tablet Place 1 tablet (0.4 mg total) under the tongue every 5 (five) minutes as needed for chest pain. 30  tablet 1   potassium chloride  (KLOR-CON ) 10 MEQ tablet TAKE 7 TABLETS (70 MEQ TOTAL) BY MOUTH DAILY. 630 tablet 2   RESTASIS 0.05 % ophthalmic emulsion Place 1 drop into both eyes daily as needed.     rosuvastatin  (CRESTOR ) 40 MG tablet TAKE 1 TABLET BY MOUTH EVERYDAY AT BEDTIME 90 tablet 2   Tafamidis  Meglumine , Cardiac, (VYNDAQEL ) 20 MG CAPS Take 4 capsules (80 mg)  by mouth daily. 120 capsule 11   tirzepatide  (MOUNJARO ) 15 MG/0.5ML Pen Inject 15 mg into the skin once a week. 2 mL 11   torsemide  (DEMADEX ) 20 MG tablet TAKE 4 TABLETS BY MOUTH EVERY DAY 360 tablet 1   Vitamin A 2400 MCG (8000 UT) CAPS Take 1 tablet by mouth daily.     vutrisiran  sodium (AMVUTTRA ) 25 MG/0.5ML syringe Inject 0.5 mLs (25 mg total) into the skin every 3 (three) months. 0.5 mL 3   No current facility-administered medications for this encounter.    Allergies:   Tramadol and Spironolactone    Social History:  The patient  reports that she has never smoked. She has never used smokeless tobacco. She reports that she does not drink alcohol and does not use drugs.   Family History:  The patient's family history includes CVA in her mother.   ROS:  Please see the history of present illness.   All other systems are personally reviewed and negative.   VS   Ht 5' 6.5" (1.689 m)   Wt (!) 145.6 kg (321 lb)   BMI 51.03 kg/m   Wt Readings from Last 3 Encounters:  10/27/23 (!) 145.6 kg (321 lb)  06/09/23 (!) 149.1 kg (328 lb 12.8 oz)  04/20/23 (!) 151.5 kg (334 lb)   PHYSICAL EXAM: General:   No resp difficulty Neck: supple. no JVD.  Cor: PMI nondisplaced. Irregular rate & rhythm. No rubs, gallops . RUSB 2/6 murmurs. Lungs: clear Abdomen: obese, soft, nontender, nondistended.  Extremities: no cyanosis, clubbing, rash, edema Neuro: alert & oriented x3  ASSESSMENT AND PLAN: 1. 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  EF 35-40% on TEE in 6/15 but then EF back to 60-65% by 10/15 and remained  normal on followup echoes up to 2/24.  LHC without obstructive coronary disease in 3/15 and again in 11/23.  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 She  appears to have hereditary transthyretin amyloidosis based on PYP scan and genetic testing.  - Echo 05/2023 EF 60-65% RV normal Severe AS NYHA II. Appears euvolemic. Continue torsemide  80 mg daily + KCl 70 meq daily.  - Continue Toprol  XL 100 mg bid. - Continue empagliflozin  10 mg daily.  - Check BMET  2. 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  Atrial fibrillation and carpal tunnel syndrome, both of which she has, go along with amyloidosis.  Her history of orthostatic symptoms as well as symptoms in toes and feet consistent with peripheral neuropathy are also likely related to amyloidosis.  - Continue tafamidis .   - Continue vutrisiran .   - She was seen by  Dr. 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 Given autosomal dominant condition, [name]'s daughter is at a 50% risk of inheriting the pathogenic variant in TTR (V142I). Genetic testing for the familial pathogenic variant was recommended but pt's daughter declined.  3. Atrial fibrillation, Chronic : Last DCCV in 2/20 but back in atrial fibrillation since then.  Dr. Nunzio [name] saw, decided against atrial fibrillation ablation due to morbid obesity.  EP decided against Tikosyn due to compliance concerns. She failed amiodarone .   - Rate controlled.  - Continue Toprol  XL 100 mg bid.  - Continue Eliquis .   4. CAD: Mild, nonobstructive by cath 3/15 and again in 11/23. No chest pain.  - No ASA given Eliquis  use.   - Continue Crestor  40 mg daily and Zetia . Check lipids today. 5. Bilateral carpal tunnel symptoms: Likely related to amyloidosis.  6. OSA: She says  that she cannot tolerate CPAP due to tinnitus.  7.  HTN: BP controlled.  8.  Aortic stenosis: TEE 04/14/22 showed severe AS with mean gradient 40 mmHg, AVA 0.86 by VTI and 0.71 by planimetry with moderate aortic regurgitation. Echo in 2/24 with severe AS.  The valve appeared trileaflet but the right cusp appeared to dome.  Not good TAVR candidate due to RCA position.  Seen in structural heart clinic and recommended to lose weight and proceed with SAVR after weight loss.  She has lost around 60 lbs at this point.  - Echo 9/24 showed worsening of aortic stenosis.  -Echo 2025 severe AS. Followed by Dr. [name].  Plan for repeat Echo next month.  9. Obesity: Body mass index is 51.03 kg/m. Continue Mounjaro . Discussed portion control.   Check BMET   Follow up in 3 months with Dr [name].      Frazier Jacob, NP  10/27/2023  Advanced [facility] [address] Heart and Vascular [place] [address] [phone] (office) [phone] (fax)

## 2023-10-27 NOTE — Patient Instructions (Addendum)
 Good to see you today!  Labs done today, your results will be available in MyChart, we will contact you for abnormal readings.  Your physician recommends that you schedule a follow-up appointment  3 months(September) Call office in July to schedule an appointment   If you have any questions or concerns before your next appointment please send us  a message through [place] or call our office at [phone].    TO LEAVE A MESSAGE FOR THE NURSE SELECT OPTION 2, PLEASE LEAVE A MESSAGE INCLUDING: YOUR NAME DATE OF BIRTH CALL BACK NUMBER REASON FOR CALL**this is important as we prioritize the call backs  YOU WILL RECEIVE A CALL BACK THE SAME DAY AS LONG AS YOU CALL BEFORE 4:00 PM At the Advanced [facility] Clinic, you and your health needs are our priority. As part of our continuing mission to provide you with exceptional heart care, we have created designated Provider Care Teams. These Care Teams include your primary Cardiologist (physician) and Advanced Practice Providers (APPs- Physician Assistants and Nurse Practitioners) who all work together to provide you with the care you need, when you need it.   You may see any of the following providers on your designated Care Team at your next follow up: Dr Jules Oar Dr Peder Bourdon Dr. Alwin Baars Dr. Arta Lark Amy Marijane Shoulders, NP Ruddy Corral, [place] [facility] [place], [place] Dennise Fitz, NP [place] Lee, NP Shawnee Dellen, NP Luster Salters, PharmD Bevely Brush, PharmD   Please be sure to bring in all your medications bottles to every appointment.    Thank you for choosing [facility]

## 2023-10-28 ENCOUNTER — Other Ambulatory Visit: Payer: Self-pay

## 2023-10-28 ENCOUNTER — Ambulatory Visit (HOSPITAL_COMMUNITY): Payer: Self-pay | Admitting: Adult Health

## 2023-10-30 ENCOUNTER — Other Ambulatory Visit: Payer: Self-pay

## 2023-10-30 ENCOUNTER — Other Ambulatory Visit: Payer: Self-pay | Admitting: Pharmacy Technician

## 2023-10-30 NOTE — Progress Notes (Signed)
 Specialty Pharmacy Refill Coordination Note  [name] is a 68 y.o. female contacted today regarding refills of specialty medication(s) Tafamidis  Meglumine  (Cardiac) (Vyndaqel )   Patient requested Delivery   Delivery date: 11/04/23   Verified address: [address] [name]   Medication will be filled on 11/03/23.

## 2023-11-01 ENCOUNTER — Other Ambulatory Visit (HOSPITAL_COMMUNITY): Payer: Self-pay | Admitting: Cardiology

## 2023-11-02 ENCOUNTER — Other Ambulatory Visit: Payer: Self-pay

## 2023-11-02 ENCOUNTER — Other Ambulatory Visit (HOSPITAL_COMMUNITY): Payer: Self-pay

## 2023-11-03 ENCOUNTER — Other Ambulatory Visit (HOSPITAL_COMMUNITY): Payer: Self-pay

## 2023-11-10 ENCOUNTER — Other Ambulatory Visit (HOSPITAL_COMMUNITY): Payer: Self-pay

## 2023-11-11 ENCOUNTER — Other Ambulatory Visit (HOSPITAL_COMMUNITY): Payer: Self-pay

## 2023-11-11 ENCOUNTER — Other Ambulatory Visit: Payer: Self-pay

## 2023-11-11 ENCOUNTER — Telehealth (HOSPITAL_COMMUNITY): Payer: Self-pay

## 2023-11-11 NOTE — Progress Notes (Signed)
 Prior auth approved in separate encounter

## 2023-11-11 NOTE — Progress Notes (Signed)
 Specialty Pharmacy Refill Coordination Note  [name] is a 68 y.o. female contacted today regarding refills of specialty medication(s) Vutrisiran  Sodium (Amvuttra )   Patient requested Courier to Provider Office   Delivery date: 11/12/23   Verified address: [facility], [address], [place], [address]   Medication will be filled on 11/11/23.

## 2023-11-11 NOTE — Telephone Encounter (Signed)
 [facility] Patient Advocate Encounter  Prior authorization for Amvuttra  has been submitted and approved. Test billing returns $0 for 90 day supply.  Key: [id number] Effective: 05/20/2023 to 05/18/2024  Rachel DEL, CPhT Rx Patient Advocate Phone: [phone]

## 2023-11-12 ENCOUNTER — Other Ambulatory Visit (HOSPITAL_COMMUNITY): Payer: Self-pay

## 2023-11-16 ENCOUNTER — Ambulatory Visit (HOSPITAL_COMMUNITY)
Admission: RE | Admit: 2023-11-16 | Discharge: 2023-11-16 | Disposition: A | Discharge status: Home / Self Care | Source: Ambulatory Visit | Attending: Cardiology | Admitting: Cardiology

## 2023-11-16 DIAGNOSIS — I480 Paroxysmal atrial fibrillation: Secondary | ICD-10-CM | POA: Insufficient documentation

## 2023-11-16 DIAGNOSIS — E851 Neuropathic heredofamilial amyloidosis: Secondary | ICD-10-CM

## 2023-11-16 DIAGNOSIS — I428 Other cardiomyopathies: Secondary | ICD-10-CM | POA: Insufficient documentation

## 2023-11-16 DIAGNOSIS — I5032 Chronic diastolic (congestive) heart failure: Secondary | ICD-10-CM | POA: Diagnosis not present

## 2023-11-16 DIAGNOSIS — G63 Polyneuropathy in diseases classified elsewhere: Secondary | ICD-10-CM

## 2023-11-16 DIAGNOSIS — G5600 Carpal tunnel syndrome, unspecified upper limb: Secondary | ICD-10-CM | POA: Insufficient documentation

## 2023-11-16 MED ORDER — VUTRISIRAN SODIUM 25 MG/0.5ML ~~LOC~~ SOSY
25.0000 mg | PREFILLED_SYRINGE | Freq: Once | SUBCUTANEOUS | Status: AC
  Administered 2023-11-16: 25 mg via SUBCUTANEOUS

## 2023-11-16 NOTE — Progress Notes (Signed)
 [facility] Note   PCP:  [facility], PA-C  HF Cardiologist:  Dr. Rolan  HPI:  [name] is a 68 y.o. female who has a history of paroxysmal atrial fibrillation, obesity, and nonischemic cardiomyopathy.  She was admitted in 07/2013 at [name] with [facility] fibrillation and CHF.  TEE was done in preparation for DCCV, showing EF 25-30% but there was LA thrombus so DCCV was not done.  Plan was for anticoagulation x 3 months followed by TEE-guided DCCV in the OR (due to patient's body habitus).  LHC was also done, showing mild nonobstructive CAD.  Patient was diuresed with IV Lasix  in [name] and started on apixaban .    Patient had TEE again in 10/2013, showing EF 35-40% with diffuse hypokinesis and no LAA thrombus.  She was cardioverted to NSR (with difficulty).  Was started on amiodarone .  She then went back into atrial fibrillation.  After she had been loaded on amiodarone , cardioverted her again in 11/2013 to NSR.     She had gone back into atrial fibrillation in 05/2016 and amiodarone  was increased to BID in preparation for DCCV. However, she spontaneously converted to NSR.   PYP scan in 08/2017 was grade 3, strongly suggestive of TTR amyloidosis.  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  Echo 12/2017 showed EF 60%, moderate LVH, mild aortic stenosis.  Of note, she has carpal tunnel syndrome.  She was started on tafamidis .    She went back into atrial fibrillation and had DCCV in 06/2018 but was back in atrial fibrillation a few weeks later when she went to atrial fibrillation clinic.  Dr. Kelsie thought that morbid obesity precluded atrial fibrillation ablation and concerns about compliance led them not to start her on Tikosyn.  Instead, it was recommended that she start on CPAP for her OSA, lose weight, and re-attempt DCCV while on amiodarone .    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  In 7/21, she developed hematochezia and had EGD/colonoscopy which were unrevealing except for internal hemorrhoids.    Echo in 02/2021 showed EF 60-65%, normal RV, moderate AS mean gradient 20 mmHg with AVA 1.56 cm^2, mild AI.    Echo 04/08/22  EF 55-60%, mild LVH, mildly decreased RV systolic function with normal RV size, PASP 47 mmHg, paradoxical low flow/low gradient severe AS (mean 25 mmHg, AVA 0.6 cm^2).    Admitted after TEE/LHC/RHC in 11/23 for aortic stenosis workup. TEE showed an abnormal trileaflet aortic valve with doming of the right coronary cusp. There was severe aortic stenosis with moderate aortic insufficiency. LHC showed nonobstructive CAD. RHC showed elevated right and left heart filling pressures with pulmonary venous hypertension. Admitted with acute HFpEF. Diuresed well with IV Lasix , weight down 16 lbs during admission. Home diuretics regimen adjusted with torsemide  increased to 80 mg daily. She was referred to structural heart team for possible TAVR.    She saw [name] who thought that she would be best-suited for TAVR.  She saw Dr [name] who was concerned about the position of her RCA and worried about disruption of the ostium of the vessel with TAVR.  He recommended weight loss and SAVR.  She agreed to start Mounjaro . Echo was repeated in 06/2022, showing EF 55-60%, mild LVH, mild RV dysfunction, severe MR with AVA 0.8 cm^2 and mean gradient 33 mmHg.  Today she returns to HF clinic for  repeat injection of Amvuttra . Overall feeling ok today. No contraindications to injection. Injection administered in R arm as her left hand has been bothering her (carpal tunnel).   Assessment/Plan: 1. 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  EF 35-40% on TEE in 10/2013 but then EF back to 60-65% by 02/2014 and remained normal on echo in 12/2017 and in 02/2021.  LHC without significant coronary disease in 07/2013.  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   - Stable NYHA class II-III.  - Continue torsemide  80 mg daily + KCl 70 mEq daily. - Continue Toprol  XL 100 mg BID. - Continue empagliflozin  10 mg daily.  2. 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  Atrial fibrillation and carpal tunnel syndrome, both of which she has, go along with amyloidosis.  Her history of orthostatic symptoms as well as symptoms in toes and feet consistent with peripheral neuropathy are also likely related to amyloidosis.  - Continue tafamidis .   - Amvuttra  (vutrisiran ) injection administered in clinic today. Patient tolerated injection well. Provided patient counseling on Amvuttra . Most common side effects are injection site reactions, arthralgias and dyspnea. Patient is aware to return to clinic every 3 months for repeat injection. Amvuttra  will be obtained from [name] and couriered to clinic for injection.  - Continue vitamin A supplement 8000 IU daily. Amvuttra  decreases serum vitamin A levels. - She was seen by  Dr. 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 Given autosomal dominant condition, [name] daughter is at a 50% risk of inheriting the pathogenic variant in TTR (V142I). Genetic testing for the familial pathogenic variant was recommended but pt's daughter declined.   Follow up 3 months for repeat Amvuttra  injection.   [name], PharmD, BCPS, BCCP, CPP [facility] Clinic Pharmacist [phone]

## 2023-11-16 NOTE — Patient Instructions (Signed)
It was a pleasure seeing you today! ? ?MEDICATIONS: ?-No medication changes today ?-Call if you have questions about your medications. ? ? ?NEXT APPOINTMENT: ?Return to clinic in 3 months for repeat injection of Amvuttra. ? ?In general, to take care of your heart failure: ?-Limit your fluid intake to 2 Liters (half-gallon) per day.   ?-Limit your salt intake to ideally 2-3 grams (2000-3000 mg) per day. ?-Weigh yourself daily and record, and bring that "weight diary" to your next appointment.  (Weight gain of 2-3 pounds in 1 day typically means fluid weight.) ?-The medications for your heart are to help your heart and help you live longer.   ?-Please contact us before stopping any of your heart medications. ? ?Call the clinic at [phone] with questions or to reschedule future appointments.  ?

## 2023-11-23 ENCOUNTER — Other Ambulatory Visit (HOSPITAL_COMMUNITY): Payer: Self-pay

## 2023-11-26 ENCOUNTER — Other Ambulatory Visit: Payer: Self-pay

## 2023-11-26 NOTE — Progress Notes (Signed)
 Specialty Pharmacy Refill Coordination Note  [name] is a 68 y.o. female contacted today regarding refills of specialty medication(s) Tafamidis  Meglumine  (Cardiac) (Vyndaqel )   Patient requested Delivery   Delivery date: 11/30/23   Verified address: [address] [place] [address]   Medication will be filled on 11/27/23.

## 2023-12-01 ENCOUNTER — Ambulatory Visit (HOSPITAL_COMMUNITY)
Admission: RE | Admit: 2023-12-01 | Discharge: 2023-12-01 | Disposition: A | Discharge status: Home / Self Care | Payer: Medicare HMO | Source: Ambulatory Visit | Attending: Internal Medicine | Admitting: Internal Medicine

## 2023-12-01 DIAGNOSIS — I350 Nonrheumatic aortic (valve) stenosis: Secondary | ICD-10-CM | POA: Insufficient documentation

## 2023-12-02 ENCOUNTER — Ambulatory Visit: Payer: Self-pay | Admitting: Internal Medicine

## 2023-12-02 LAB — ECHOCARDIOGRAM COMPLETE
AR max vel: 0.69 cm2
AV Area VTI: 0.72 cm2
AV Area mean vel: 0.74 cm2
AV Mean grad: 34 mmHg
AV Peak grad: 58.4 mmHg
Ao pk vel: 3.82 m/s
P 1/2 time: 353 ms
S' Lateral: 3.1 cm

## 2023-12-11 ENCOUNTER — Other Ambulatory Visit (HOSPITAL_COMMUNITY): Payer: Self-pay | Admitting: Cardiology

## 2023-12-12 NOTE — Progress Notes (Signed)
 Patient ID: Kimberly [name] MRN: [id number] DOB/AGE: April 16, 1956 68 y.o.  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 likely in jeopardy with TAVR AVA 0.72, MG 34, SVI 25, DI 0.23, V-max 3.8, EF 60 to 65% c/w PLFLGAS TTE July 2025  Coronary artery disease 40% distal LAD cath 2023 Paroxysmal atrial fibrillation On apixaban  Nonischemic cardiomyopathy EF 35 to 40% 2015 now normalized Cardiac amyloidosis PYP scan 2019 On tafamidis  and vutrisiran  BMI over 50 On Mounjaro  Type 2 diabetes mellitus Not on insulin Hypertension Hyperlipidemia LP(a) 458   HISTORY OF PRESENT ILLNESS: December 2023 consultation: The patient is a 68 y.o. female with the indicated medical history here for recommendations regarding her severe aortic valvular disease.  The patient recently underwent an elective TEE to evaluate her aortic valve stenosis.  She was found to be volume overloaded at time and admitted for diuresis.  She lost 16 pounds with IV Lasix  and was discharged home with a increased dose of torsemide .  The patient endorses NYHA class II symptoms of shortness of breath.  She feels however better than she did prior to the hospital.  She endorses stable two-pillow orthopnea.  She denies any peripheral edema.  She denies any exertional angina.  She denies any presyncope or syncope.  In terms of her dental health she has not seen a dentist in some time and fears that she might have teeth that need to be extracted.  She has problems with knee pain and so she is not very active.  She tells me when she walks upstairs she will get short of breath.  She has not had any shortness of breath at rest since discharge.  She  occasionally does develop vertigo likely related to her schwannoma.  January 2024: In the interim the patient was seen by cardiothoracic surgery and thought to be best served by a nonsurgical approach to treat her aortic stenosis.  We reviewed her imaging and she has a very effaced right sinus with a dominant right coronary artery which could be jeopardized.  She seems to have femoral access by CT scan with no high-grade obstructions.  The patient is here today and is doing relatively well.  She is able to do her activities of daily living without any significant shortness of breath.  Thing that limits her is knee pain.  She is able to walk up and down the [place] at a grocery store but has to stop because of knee pain.  She denies any paroxysmal nocturnal dyspnea, orthopnea, presyncope, or syncope.  She fortunately has not required any emergency room visits or hospitalizations.  Plan: Refer to pharmacy regarding Ozempic  or Mounjaro , obtain echocardiogram, follow-up in 2 months.  March 2024: In the interim the patient had her semaglutide  dose increased.  It does not look like she had a discussion with pharmacy regarding Mounjaro  or Wegovy .  Since I saw her.  She denies any significantly increasing shortness of breath.  She does get lightheaded when she goes from laying to sitting in the morning.  She does not have any other episodes of presyncope or syncope.  She denies any chest pain and her chronic shortness of breath is relatively unchanged.  She is otherwise without significant cardiovascular complaints today.  Plan: Stressed  that patient anatomy was suboptimal for transcatheter attic valve replacement given jeopardy of right coronary artery; stressed need to lose weight to become a more optimal surgical candidate.  October 2024: There are interim the patient started Mounjaro  for weight loss.  She has lost about 27 pounds.  Her Mounjaro  dose was recently increased.  There was time when she was not able to  get the medication may be over 1 or 2 weeks but this has been rectified.  She feels less short of breath.   She is able to walk without having to stop as frequently as before.  She denies any exertional angina.  She has had some lightheadedness when going from sitting to standing.  She does have chronic lightheadedness with her schwannoma but this typically happens when she is lying down.  She fortunately has not required any emergency room visits or hospitalizations.  She generally feels well.  Plan: Continue current therapy and return for follow-up in 3 months with another echocardiogram.  1/25: In the interim the patient was seen by cardiology and had lost a total of 60 pounds since starting Mounjaro .  She is without cardiovascular complaints and her ambulation was limited by bilateral knee osteoarthritis.  An echocardiogram demonstrated paradoxical low-flow low gradient aortic stenosis.  The patient is doing very well.  She is lost close to 60 pounds since starting Mounjaro .  She denies any dyspnea.  She is able to walk much farther than she could before I met her.  She denies any chest pain.  She has had no presyncope or syncope.  Her biggest issue seems to be stemming from her known schwannoma.  She sometimes gets dizzy when she moves her head to the left when she is laying in bed.  This does not not occur when she is up and moving.  It also is associated with bothersome tinnitus.  She will be following up in [place] about this issue.  Plan: Continue current therapy.  August 2025:  Patient consents to use of AI scribe. The patient returns for routine follow-up.   She reports no severe shortness of breath or chest pain and states she can walk long distances without stopping. However, she experiences occasional dizziness, primarily when sitting, which she attributes to a tumor rather than her heart condition.  She is currently on Mounjaro  for weight management and has lost approximately 40 to 50  pounds since starting the medication. Her weight fluctuates between 320 and 316 pounds. She notes discrepancies between her home scale and the doctor's office scale and expresses uncertainty about the rate of her weight loss. She reports no issues with the Mounjaro  injections.  Her knees and feet are more bothersome than her heart, impacting her ability to walk. She lives alone, is currently disabled, and not working. No chest pain or blacking out spells. She does report occasional dizziness, primarily when sitting. Reports good sleep.  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   left    Past Surgical History:  Procedure Laterality Date   CARDIOVERSION N/A 07/29/2013   Procedure: CARDIOVERSION;  Surgeon:  Leim VEAR Moose, MD;  Location: [facility] ENDOSCOPY;  Service: Cardiovascular;  Laterality: N/A;   CARDIOVERSION N/A 11/11/2013   Procedure: CARDIOVERSION;  Surgeon: Ezra GORMAN Shuck, MD;  Location: [facility] ENDOSCOPY;  Service: Cardiovascular;  Laterality: N/A;   CARDIOVERSION N/A 12/08/2013   Procedure: CARDIOVERSION;  Surgeon: Ezra GORMAN Shuck, MD;  Location: [facility] ENDOSCOPY;  Service: Cardiovascular;  Laterality: N/A;   CARDIOVERSION N/A 06/30/2018   Procedure: CARDIOVERSION;  Surgeon: Shuck Ezra GORMAN, MD;  Location: [facility] ENDOSCOPY;  Service: Cardiovascular;  Laterality: N/A;   JOINT REPLACEMENT     LEFT HEART CATHETERIZATION WITH CORONARY ANGIOGRAM N/A 08/01/2013   Procedure: LEFT HEART CATHETERIZATION WITH CORONARY ANGIOGRAM;  Surgeon: Ozell JONETTA Fell, MD;  Location: [facility] CATH LAB;  Service: Cardiovascular;  Laterality: N/A;   RIGHT/LEFT HEART CATH AND CORONARY ANGIOGRAPHY  N/A 04/14/2022   Procedure: RIGHT/LEFT HEART CATH AND CORONARY ANGIOGRAPHY;  Surgeon: Shuck Ezra GORMAN, MD;  Location: [facility] INVASIVE CV LAB;  Service: Cardiovascular;  Laterality: N/A;   TEE WITHOUT CARDIOVERSION N/A 07/29/2013   Procedure: TRANSESOPHAGEAL ECHOCARDIOGRAM (TEE);  Surgeon: Leim VEAR Moose, MD;  Location: [facility] ENDOSCOPY;  Service: Cardiovascular;  Laterality: N/A;   TEE WITHOUT CARDIOVERSION N/A 11/11/2013   Procedure: TRANSESOPHAGEAL ECHOCARDIOGRAM (TEE);  Surgeon: Ezra GORMAN Shuck, MD;  Location: [facility] ENDOSCOPY;  Service: Cardiovascular;  Laterality: N/A;   TEE WITHOUT CARDIOVERSION N/A 04/14/2022   Procedure: TRANSESOPHAGEAL ECHOCARDIOGRAM (TEE);  Surgeon: Shuck Ezra GORMAN, MD;  Location: [facility] ENDOSCOPY;  Service: Cardiovascular;  Laterality: N/A;    Family History  Problem Relation Age of Onset   CVA Mother     Social History   Socioeconomic History   Marital status: Single    Spouse name: Not on file   Number of children: Not on file   Years of education: Not on file   Highest education level: Not on file  Occupational History   Not on file  Tobacco Use   Smoking status: Never   Smokeless tobacco: Never  Vaping Use   Vaping status: Never Used  Substance and Sexual Activity   Alcohol use: No    Alcohol/week: 0.0 standard drinks of alcohol   Drug use: No   Sexual activity: Not Currently  Other Topics Concern   Not on file  Social History Narrative   Right handed    One story home alone   Caffeine 2 cups a day   Social Drivers of Health   Financial Resource Strain: Not on file  Food Insecurity: No Food Insecurity (04/14/2022)   Hunger Vital Sign    Worried About Running Out of Food in the Last Year: Never true    Ran Out of Food in the Last Year: Never true  Transportation Needs: No Transportation Needs (04/14/2022)   PRAPARE - Administrator, Civil Service (Medical): No    Lack of Transportation (Non-Medical): No  Physical Activity: Not on file   Stress: Not on file  Social Connections: Not on file  Intimate Partner Violence: Not At Risk (04/14/2022)   Humiliation, Afraid, Rape, and Kick questionnaire    Fear of Current or Ex-Partner: No    Emotionally Abused: No    Physically Abused: No    Sexually Abused: No     Prior to Admission medications   Medication Sig Start Date End Date Taking? Authorizing Provider  acetaminophen  (TYLENOL ) 650 MG CR tablet Take 1,300 mg by mouth every 8 (eight) hours as needed for pain.     [provider]  benzonatate (  TESSALON) 100 MG capsule Take 100 mg by mouth 3 (three) times daily as needed. 04/01/21   [provider]  Cholecalciferol  (VITAMIN D -3) 5000 UNITS TABS Take 5,000 Units by mouth daily.    [provider]  Docusate Sodium (DSS) 100 MG CAPS Take by mouth as needed.    [provider]  ELIQUIS  5 MG TABS tablet TAKE 1 TABLET (5 MG TOTAL) BY MOUTH 2 (TWO) TIMES DAILY. Patient taking differently: Take 5 mg by mouth 2 (two) times daily. 06/07/21   Rolan Ezra RAMAN, MD  ezetimibe  (ZETIA ) 10 MG tablet Take 0.5 tablets (5 mg total) by mouth daily. Patient not taking: Reported on 04/14/2022 11/28/21 11/28/22  Marcine Catalan M, PA-C  Ferrous Sulfate  27 MG TABS Take 27 mg by mouth 2 (two) times daily.     [provider]  fluticasone furoate-vilanterol (BREO ELLIPTA) 100-25 MCG/INH AEPB Inhale 1 puff into the lungs every morning. As needed 06/01/20   [provider]  JARDIANCE  10 MG TABS tablet TAKE 1 TABLET BY MOUTH DAILY BEFORE BREAKFAST. Patient taking differently: Take 10 mg by mouth daily. 07/22/21   Milford, Harlene HERO, FNP  ketoconazole (NIZORAL) 2 % cream Apply 1 application topically as needed. 12/06/19   [provider]  metoprolol  succinate (TOPROL -XL) 100 MG 24 hr tablet Take 1 tablet (100 mg total) by mouth 2 (two) times daily. 12/09/21   Rolan Ezra RAMAN, MD  nitroGLYCERIN  (NITROSTAT ) 0.4 MG SL tablet Place 1 tablet (0.4 mg  total) under the tongue every 5 (five) minutes as needed for chest pain. 04/21/19   Rolan Ezra RAMAN, MD  Omega-3 1000 MG CAPS Take 1,000 mg by mouth every morning.    [provider]  potassium chloride  (KLOR-CON ) 10 MEQ tablet Take 5 tablets (50 mEq total) by mouth daily. 04/08/22   Rolan Ezra RAMAN, MD  RESTASIS 0.05 % ophthalmic emulsion Place 1 drop into both eyes daily as needed. 06/13/19   [provider]  rosuvastatin  (CRESTOR ) 40 MG tablet Take 1 tablet (40 mg total) by mouth at bedtime. 04/02/20   Rolan Ezra RAMAN, MD  Semaglutide  (RYBELSUS ) 3 MG TABS Take 1 tablet by mouth daily. 04/17/22   Rolan Ezra RAMAN, MD  Tafamidis  Meglumine , Cardiac, (VYNDAQEL ) 20 MG CAPS Take 4 capsules (80 mg)  by mouth daily. Patient taking differently: Take 40 mg by mouth in the morning and at bedtime. 10/02/21   Rolan Ezra RAMAN, MD  torsemide  (DEMADEX ) 20 MG tablet Take 4 tablets (80 mg total) by mouth daily. 04/16/22   Clegg, Amy D, NP    Allergies  Allergen Reactions   Tramadol Nausea And Vomiting   Spironolactone  Itching    REVIEW OF SYSTEMS:  General: no fevers/chills/night sweats Eyes: no blurry vision, diplopia, or amaurosis ENT: no sore throat or hearing loss Resp: no cough, wheezing, or hemoptysis CV: no edema or palpitations GI: no abdominal pain, nausea, vomiting, diarrhea, or constipation GU: no dysuria, frequency, or hematuria Skin: no rash Neuro: no headache, numbness, tingling, or weakness of extremities Musculoskeletal: no joint pain or swelling Heme: no bleeding, DVT, or easy bruising Endo: no polydipsia or polyuria  BP 108/68   Pulse 73   Ht 5' 6 (1.676 m)   Wt (!) 326 lb 9.6 oz (148.1 kg)   SpO2 97%   BMI 52.71 kg/m   PHYSICAL EXAM: GEN:  AO x 3 in no acute distress HEENT: normal Dentition: Normal Neck: JVP normal. +2carotid upstrokes without bruits. No thyromegaly. Lungs:  equal expansion, clear bilaterally CV: Apex is discrete and nondisplaced,  RRR with very distant heart sounds: I cannot hear a discrete murmur Abd: soft, non-tender, non-distended; no bruit; positive bowel sounds Ext: no edema, ecchymoses, or cyanosis Vascular: 2+ femoral pulses, 2+ radial pulses       Skin: warm and dry without rash Neuro: CN II-XII grossly intact; motor and sensory grossly intact    DATA AND STUDIES:  EKG: 2025 Atrial fibrillation without conduction disease  TTE:  February 2024  1. Left ventricular ejection fraction, by estimation, is 55 to 60%. The  left ventricle has normal function. The left ventricle demonstrates  regional wall motion abnormalities with possible basal inferior  hypokinesis. There is mild concentric left  ventricular hypertrophy. Left ventricular diastolic parameters are  indeterminate.   2. Right ventricular systolic function is mildly reduced. The right  ventricular size is mildly enlarged. Tricuspid regurgitation signal is  inadequate for assessing PA pressure.   3. Left atrial size was mildly dilated.   4. Right atrial size was mildly dilated.   5. The mitral valve is normal in structure. Mild mitral valve  regurgitation. No evidence of mitral stenosis.   6. The aortic valve is tricuspid. There is severe calcifcation of the  aortic valve. Aortic valve regurgitation is mild to moderate. Paradoxical  low flow/low gradient severe aortic valve stenosis. Aortic valve area, by  VTI measures 0.80 cm. Aortic valve   mean gradient measures 33.0 mmHg.   7. The inferior vena cava is dilated in size with >50% respiratory  variability, suggesting right atrial pressure of 8 mmHg.   8. The patient was in atrial fibrillation.   CARDIAC CATH: November 2023 1. Elevated right and left heart filling pressures.  2. Pulmonary venous hypertension. 3. Peak to peak aortic valve gradient 18 mmHg 4. Nonobstructive mild CAD.   STS RISK CALCULATOR: Pending  NHYA CLASS: 1    ASSESSMENT AND PLAN:   Nonrheumatic aortic valve  stenosis - Plan: ECHOCARDIOGRAM COMPLETE, Lipid panel, Hepatic function panel  Mild CAD - Plan: ECHOCARDIOGRAM COMPLETE, Lipid panel, Hepatic function panel  PAF (paroxysmal atrial fibrillation) (HCC) - Plan: ECHOCARDIOGRAM COMPLETE, Lipid panel, Hepatic function panel  Secondary hypercoagulable state (HCC) - Plan: ECHOCARDIOGRAM COMPLETE, Lipid panel, Hepatic function panel  Cardiac amyloidosis (HCC) - Plan: ECHOCARDIOGRAM COMPLETE, Lipid panel, Hepatic function panel  BMI 50.0-59.9, adult (HCC) - Plan: ECHOCARDIOGRAM COMPLETE, Lipid panel, Hepatic function panel  Type 2 diabetes mellitus without complication, without long-term current use of insulin (HCC) - Plan: ECHOCARDIOGRAM COMPLETE, Lipid panel, Hepatic function panel  Hypertension associated with diabetes (HCC) - Plan: ECHOCARDIOGRAM COMPLETE, Lipid panel, Hepatic function panel  Hyperlipidemia associated with type 2 diabetes mellitus (HCC) - Plan: ECHOCARDIOGRAM COMPLETE, Lipid panel, Hepatic function panel  Aortic stenosis: The patient remains well from a symptomatic point of view and is able to do all of her activities of daily living.  While she has lost about 50 to 60 pounds I do not think this will make her surgical candidate.  If not a candidate for SAVR, would pursue BASILICA of right leaflet and TAVR versus UNICORN and TAVR.  I told her that I think a complicated transcatheter approach would be needed that she has to have very lifestyle limiting symptoms before we would pursue intervention.  I am concerned about progression of aortic stenosis in this individual.  I will see her back in 3 months with another echocardiogram. Coronary artery disease:Continue Eliquis  5 mg twice daily, rosuvastatin  40 mg daily,  as needed nitroglycerin . PAF: Continue Eliquis  5 mg twice daily, Toprol  100 mg daily Secondary hypercoagulable state: Continue Eliquis  5 mg twice daily Cardiac amyloidosis: Continue tafamidis  80 mg daily and vutrisiran  25 mg  every 3 months Elevated BMI: Continue Mounjaro  15 mg q. Weekly T2DM: Continue Eliquis  5 mg twice daily, Jardiance  10 mg, rosuvastatin  40 mg.   Hypertension: Continue Toprol  100 mg twice daily, BP is well-controlled.  Could consider ARB in the future. Hyperlipidemia: Continue rosuvastatin  40 mg daily and Zetia  10 mg daily.  Check lipid panel and LFTs today; given elevated LP(a) goal LDL is less than 55.  If above goal will refer to pharmacy for further recommendations.  I spent 42 minutes reviewing all clinical data during and prior to this visit including all relevant imaging studies, laboratories, clinical information from other health systems and prior notes from both Cardiology and other specialties, interviewing the patient, conducting a complete physical examination, and coordinating care in order to formulate a comprehensive and personalized evaluation and treatment plan.    Armelia Penton K Jamarri Vuncannon, MD  12/21/2023 11:34 AM    [facility] [address] [place], [place], [place]  [address] Phone: [phone]; Fax: [phone]

## 2023-12-21 ENCOUNTER — Other Ambulatory Visit (HOSPITAL_COMMUNITY): Payer: Self-pay | Admitting: Cardiology

## 2023-12-21 ENCOUNTER — Encounter: Payer: Self-pay | Admitting: Internal Medicine

## 2023-12-21 ENCOUNTER — Other Ambulatory Visit (HOSPITAL_COMMUNITY): Payer: Self-pay

## 2023-12-21 ENCOUNTER — Ambulatory Visit: Attending: Internal Medicine | Admitting: Internal Medicine

## 2023-12-21 VITALS — BP 108/68 | HR 73 | Ht 66.0 in | Wt 326.6 lb

## 2023-12-21 DIAGNOSIS — I480 Paroxysmal atrial fibrillation: Secondary | ICD-10-CM | POA: Diagnosis not present

## 2023-12-21 DIAGNOSIS — E119 Type 2 diabetes mellitus without complications: Secondary | ICD-10-CM

## 2023-12-21 DIAGNOSIS — I43 Cardiomyopathy in diseases classified elsewhere: Secondary | ICD-10-CM

## 2023-12-21 DIAGNOSIS — E785 Hyperlipidemia, unspecified: Secondary | ICD-10-CM

## 2023-12-21 DIAGNOSIS — Z6841 Body Mass Index (BMI) 40.0 and over, adult: Secondary | ICD-10-CM

## 2023-12-21 DIAGNOSIS — D6869 Other thrombophilia: Secondary | ICD-10-CM | POA: Diagnosis not present

## 2023-12-21 DIAGNOSIS — I350 Nonrheumatic aortic (valve) stenosis: Secondary | ICD-10-CM | POA: Diagnosis not present

## 2023-12-21 DIAGNOSIS — I2510 Atherosclerotic heart disease of native coronary artery without angina pectoris: Secondary | ICD-10-CM

## 2023-12-21 DIAGNOSIS — I152 Hypertension secondary to endocrine disorders: Secondary | ICD-10-CM

## 2023-12-21 DIAGNOSIS — E1169 Type 2 diabetes mellitus with other specified complication: Secondary | ICD-10-CM

## 2023-12-21 DIAGNOSIS — E854 Organ-limited amyloidosis: Secondary | ICD-10-CM

## 2023-12-21 DIAGNOSIS — E1159 Type 2 diabetes mellitus with other circulatory complications: Secondary | ICD-10-CM

## 2023-12-21 NOTE — Patient Instructions (Addendum)
 Medication Instructions:  Your physician recommends that you continue on your current medications as directed. Please refer to the Current Medication list given to you today. *If you need a refill on your cardiac medications before your next appointment, please call your pharmacy*  Lab Work: TODAY-LIPIDS & LFTS If you have labs (blood work) drawn today and your tests are completely normal, you will receive your results only by: MyChart Message (if you have MyChart) OR A paper copy in the mail If you have any lab test that is abnormal or we need to change your treatment, we will call you to review the results.  Testing/Procedures: Your physician has requested that you have an echocardiogram. Echocardiography is a painless test that uses sound waves to create images of your heart. It provides your doctor with information about the size and shape of your heart and how well your heart's chambers and valves are working. This procedure takes approximately one hour. There are no restrictions for this procedure. Please do NOT wear cologne, perfume, aftershave, or lotions (deodorant is allowed). Please arrive 15 minutes prior to your appointment time.  Please note: We ask at that you not bring children with you during ultrasound (echo/ vascular) testing. Due to room size and safety concerns, children are not allowed in the ultrasound rooms during exams. Our front office staff cannot provide observation of children in our lobby area while testing is being conducted. An adult accompanying a patient to their appointment will only be allowed in the ultrasound room at the discretion of the ultrasound technician under special circumstances. We apologize for any inconvenience. PLEASE SCHEDULE ECHO IN 3 MONTHS   Follow-Up: At [facility], you and your health needs are our priority.  As part of our continuing mission to provide you with exceptional heart care, our providers are all part of one team.   This team includes your primary Cardiologist (physician) and Advanced Practice Providers or APPs (Physician Assistants and Nurse Practitioners) who all work together to provide you with the care you need, when you need it.  Your next appointment:   3 month(s)  Provider:   Arun K Thukkani, MD    We recommend signing up for the patient portal called MyChart.  Sign up information is provided on this After Visit Summary.  MyChart is used to connect with patients for Virtual Visits (Telemedicine).  Patients are able to view lab/test results, encounter notes, upcoming appointments, etc.  Non-urgent messages can be sent to your provider as well.   To learn more about what you can do with MyChart, go to [email or web address].   Other Instructions

## 2023-12-22 ENCOUNTER — Ambulatory Visit: Payer: Self-pay | Admitting: Internal Medicine

## 2023-12-22 DIAGNOSIS — E1169 Type 2 diabetes mellitus with other specified complication: Secondary | ICD-10-CM

## 2023-12-22 LAB — HEPATIC FUNCTION PANEL
ALT: 20 IU/L (ref 0–32)
AST: 25 IU/L (ref 0–40)
Albumin: 4.4 g/dL (ref 3.9–4.9)
Alkaline Phosphatase: 124 IU/L — ABNORMAL HIGH (ref 44–121)
Bilirubin Total: 0.8 mg/dL (ref 0.0–1.2)
Bilirubin, Direct: 0.25 mg/dL (ref 0.00–0.40)
Total Protein: 7.5 g/dL (ref 6.0–8.5)

## 2023-12-22 LAB — LIPID PANEL
Chol/HDL Ratio: 3.1 ratio (ref 0.0–4.4)
Cholesterol, Total: 171 mg/dL (ref 100–199)
HDL: 56 mg/dL (ref >39)
LDL Chol Calc (NIH): 97 mg/dL (ref 0–99)
Triglycerides: 98 mg/dL (ref 0–149)
VLDL Cholesterol Cal: 18 mg/dL (ref 5–40)

## 2023-12-31 ENCOUNTER — Other Ambulatory Visit: Payer: Self-pay

## 2023-12-31 ENCOUNTER — Other Ambulatory Visit: Payer: Self-pay | Admitting: Pharmacy Technician

## 2023-12-31 NOTE — Progress Notes (Signed)
 Specialty Pharmacy Refill Coordination Note  Kearra Calkin is a 68 y.o. female contacted today regarding refills of specialty medication(s) Tafamidis  Meglumine  (Cardiac) (Vyndaqel )   Patient requested Delivery   Delivery date: 01/05/24   Verified address: [address]  [place]   Medication will be filled on 01/04/24.

## 2024-01-07 ENCOUNTER — Other Ambulatory Visit (HOSPITAL_COMMUNITY): Payer: Self-pay | Admitting: Cardiology

## 2024-01-26 ENCOUNTER — Other Ambulatory Visit (HOSPITAL_COMMUNITY): Payer: Self-pay

## 2024-01-27 ENCOUNTER — Other Ambulatory Visit (HOSPITAL_COMMUNITY): Payer: Self-pay

## 2024-01-27 ENCOUNTER — Other Ambulatory Visit: Payer: Self-pay

## 2024-01-27 ENCOUNTER — Other Ambulatory Visit: Payer: Self-pay | Admitting: Pharmacy Technician

## 2024-01-27 NOTE — Progress Notes (Signed)
 Specialty Pharmacy Refill Coordination Note  Kimberly [name] is a 68 y.o. female contacted today regarding refills of specialty medication(s) Tafamidis  Meglumine  (Cardiac) (Vyndaqel )   Patient requested Delivery   Delivery date: 02/02/24   Verified address: [address]  [place] [address]   Medication will be filled on 02/01/24.

## 2024-02-01 ENCOUNTER — Other Ambulatory Visit: Payer: Self-pay

## 2024-02-05 ENCOUNTER — Other Ambulatory Visit: Payer: Self-pay

## 2024-02-05 NOTE — Progress Notes (Signed)
 Specialty Pharmacy Refill Coordination Note  Kimberly [name] is a 68 y.o. female assessed today regarding refills of clinic administered specialty medication(s) Vutrisiran  Sodium (Amvuttra )   Clinic requested Delivery   Delivery date: 02/11/24   Verified address: [facility] advanced heart failure clinic - [address] [place]   Medication will be filled on 09.24.25.     Appointment 09.29.25

## 2024-02-08 ENCOUNTER — Other Ambulatory Visit: Payer: Self-pay

## 2024-02-08 ENCOUNTER — Other Ambulatory Visit (HOSPITAL_COMMUNITY): Payer: Self-pay

## 2024-02-08 ENCOUNTER — Other Ambulatory Visit (HOSPITAL_COMMUNITY): Payer: Self-pay | Admitting: Family Medicine

## 2024-02-10 ENCOUNTER — Other Ambulatory Visit: Payer: Self-pay

## 2024-02-12 ENCOUNTER — Telehealth (HOSPITAL_COMMUNITY): Payer: Self-pay | Admitting: Cardiology

## 2024-02-12 NOTE — Telephone Encounter (Signed)
 Called to confirm/remind patient of their appointment at the [facility] on 02/12/2024.   Appointment:   [] Confirmed  [x] Left mess   [] No answer/No voice mail  [] VM Full/unable to leave message  [] Phone not in service  Patient reminded to bring all medications and/or complete list.  Confirmed patient has transportation. Gave directions, instructed to utilize valet parking.

## 2024-02-15 ENCOUNTER — Ambulatory Visit (HOSPITAL_COMMUNITY): Payer: Self-pay | Admitting: Cardiology

## 2024-02-15 ENCOUNTER — Telehealth (HOSPITAL_COMMUNITY): Payer: Self-pay | Admitting: Pharmacist

## 2024-02-15 ENCOUNTER — Ambulatory Visit (HOSPITAL_COMMUNITY)
Admission: RE | Admit: 2024-02-15 | Discharge: 2024-02-15 | Disposition: A | Source: Ambulatory Visit | Attending: Cardiology | Admitting: Cardiology

## 2024-02-15 VITALS — BP 120/82 | HR 76 | Wt 332.6 lb

## 2024-02-15 DIAGNOSIS — Z7984 Long term (current) use of oral hypoglycemic drugs: Secondary | ICD-10-CM | POA: Insufficient documentation

## 2024-02-15 DIAGNOSIS — I4821 Permanent atrial fibrillation: Secondary | ICD-10-CM | POA: Insufficient documentation

## 2024-02-15 DIAGNOSIS — I083 Combined rheumatic disorders of mitral, aortic and tricuspid valves: Secondary | ICD-10-CM | POA: Insufficient documentation

## 2024-02-15 DIAGNOSIS — E859 Amyloidosis, unspecified: Secondary | ICD-10-CM | POA: Insufficient documentation

## 2024-02-15 DIAGNOSIS — G4733 Obstructive sleep apnea (adult) (pediatric): Secondary | ICD-10-CM | POA: Insufficient documentation

## 2024-02-15 DIAGNOSIS — G629 Polyneuropathy, unspecified: Secondary | ICD-10-CM | POA: Diagnosis not present

## 2024-02-15 DIAGNOSIS — G63 Polyneuropathy in diseases classified elsewhere: Secondary | ICD-10-CM

## 2024-02-15 DIAGNOSIS — Z7901 Long term (current) use of anticoagulants: Secondary | ICD-10-CM | POA: Diagnosis not present

## 2024-02-15 DIAGNOSIS — I428 Other cardiomyopathies: Secondary | ICD-10-CM | POA: Insufficient documentation

## 2024-02-15 DIAGNOSIS — Z79899 Other long term (current) drug therapy: Secondary | ICD-10-CM | POA: Insufficient documentation

## 2024-02-15 DIAGNOSIS — I5022 Chronic systolic (congestive) heart failure: Secondary | ICD-10-CM | POA: Insufficient documentation

## 2024-02-15 DIAGNOSIS — Z6841 Body Mass Index (BMI) 40.0 and over, adult: Secondary | ICD-10-CM | POA: Diagnosis not present

## 2024-02-15 DIAGNOSIS — E851 Neuropathic heredofamilial amyloidosis: Secondary | ICD-10-CM

## 2024-02-15 DIAGNOSIS — I110 Hypertensive heart disease with heart failure: Secondary | ICD-10-CM | POA: Diagnosis not present

## 2024-02-15 DIAGNOSIS — I2510 Atherosclerotic heart disease of native coronary artery without angina pectoris: Secondary | ICD-10-CM | POA: Insufficient documentation

## 2024-02-15 DIAGNOSIS — G5603 Carpal tunnel syndrome, bilateral upper limbs: Secondary | ICD-10-CM | POA: Diagnosis not present

## 2024-02-15 LAB — BASIC METABOLIC PANEL WITH GFR
Anion gap: 10 (ref 5–15)
BUN: 15 mg/dL (ref 8–23)
CO2: 27 mmol/L (ref 22–32)
Calcium: 9.4 mg/dL (ref 8.9–10.3)
Chloride: 103 mmol/L (ref 98–111)
Creatinine, Ser: 1.02 mg/dL — ABNORMAL HIGH (ref 0.44–1.00)
GFR, Estimated: >60 mL/min (ref >60)
Glucose, Bld: 92 mg/dL (ref 70–99)
Potassium: 4.5 mmol/L (ref 3.5–5.1)
Sodium: 140 mmol/L (ref 135–145)

## 2024-02-15 LAB — BRAIN NATRIURETIC PEPTIDE: B Natriuretic Peptide: 129 pg/mL — ABNORMAL HIGH (ref 0.0–100.0)

## 2024-02-15 MED ORDER — VUTRISIRAN SODIUM 25 MG/0.5ML ~~LOC~~ SOSY
25.0000 mg | PREFILLED_SYRINGE | Freq: Once | SUBCUTANEOUS | Status: AC
  Administered 2024-02-15: 25 mg via SUBCUTANEOUS

## 2024-02-15 NOTE — Telephone Encounter (Signed)
 Amvuttra  (vutrisiran ) injection administered in clinic visit today with Dr. Rolan. Amvuttra  given in left arm. NDC [address]-8; LOT [id number]; exp date 12/2025.   Patient tolerated injection well. Repeat injection scheduled for 05/16/24.   Tinnie Redman, PharmD, BCPS, BCCP, CPP Heart Failure Clinic Pharmacist [phone]

## 2024-02-15 NOTE — Patient Instructions (Signed)
 There has been no changes to your medications.  Labs done today, your results will be available in MyChart, we will contact you for abnormal readings.  Your physician recommends that you schedule a follow-up appointment in: 4 months.  If you have any questions or concerns before your next appointment please send [place] a message through Aristes or call our office at [phone].    TO LEAVE A MESSAGE FOR THE NURSE SELECT OPTION 2, PLEASE LEAVE A MESSAGE INCLUDING: YOUR NAME DATE OF BIRTH CALL BACK NUMBER REASON FOR CALL**this is important as we prioritize the call backs  YOU WILL RECEIVE A CALL BACK THE SAME DAY AS LONG AS YOU CALL BEFORE 4:00 PM  At the [facility], you and your health needs are our priority. As part of our continuing mission to provide you with exceptional heart care, we have created designated Provider Care Teams. These Care Teams include your primary Cardiologist (physician) and Advanced Practice Providers (APPs- Physician Assistants and Nurse Practitioners) who all work together to provide you with the care you need, when you need it.   You may see any of the following providers on your designated Care Team at your next follow up: Dr Arvilla Meres Dr Marca Ancona Dr. Dorthula Nettles Dr. Clearnce Hasten Amy Filbert Schilder, NP Robbie Lis, [place] [facility] [place], [place] Brynda Peon, NP [place] Lee, NP Clarisa Kindred, NP Karle Plumber, PharmD Enos Fling, PharmD   Please be sure to bring in all your medications bottles to every appointment.    Thank you for choosing [facility]

## 2024-02-15 NOTE — Progress Notes (Signed)
 Date:  02/15/2024   ID:  Kimberly [name], DOB [birth year], MRN [id number]   Provider location: [place] [facility] Type of Visit: Established patient   PCP:  Waddell Palma, PA-C  HF Cardiologist:  Dr. Rolan Structural Heart: Dr Wendel  Chief complaint: CHF   History of Present Illness: Kimberly [name] is a 68 y.o. female who has a history of paroxysmal atrial fibrillation, obesity, and nonischemic cardiomyopathy.  She was admitted in 3/15 at [facility] with atrial fibrillation and CHF.  TEE was done in preparation for DCCV, showing EF 25-30% but there was LA thrombus so DCCV was not done.  Plan was for anticoagulation x 3 months followed by TEE-guided DCCV in the OR (due to patient's body habitus).  LHC was also done, showing mild nonobstructive CAD.  Patient was diuresed with IV Lasix  in the hospital and started on apixaban .    Patient had TEE again in 6/15, showing EF 35-40% with diffuse hypokinesis and no LAA thrombus.  She was cardioverted to NSR (with difficulty).  I started her at that time on amiodarone .  She then went back into atrial fibrillation.  After she had been loaded on amiodarone , cardioverted her again in 7/15 to NSR.     She had gone back into atrial fibrillation in 1/18 and amiodarone  was increased to bid in preparation for DCCV. However, she spontaneously converted to NSR.   PYP scan in 4/19 was grade 3, strongly suggestive of TTR amyloidosis.  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  Echo 8/19 showed EF 60%, moderate LVH, mild aortic stenosis.  Of note, she has carpal tunnel syndrome.  She was started on tafamidis .    She went back into atrial fibrillation and had DCCV in 2/20 but was back in atrial fibrillation a few weeks later when she went to atrial fibrillation clinic.  Dr. Kelsie thought that morbid obesity precluded atrial fibrillation ablation and concerns about compliance led them not to start her on Tikosyn.   Instead, it was recommended that she start on CPAP for her OSA, lose weight, and re-attempt DCCV while on amiodarone .   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  In 7/21, she developed hematochezia and had EGD/colonoscopy which were unrevealing except for internal hemorrhoids.   Echo in 10/22 showed EF 60-65%, normal RV, moderate AS mean gradient 20 mmHg with AVA 1.56 cm^2, mild AI.   Echo 04/08/22  EF 55-60%, mild LVH, mildly decreased RV systolic function with normal RV size, PASP 47 mmHg, paradoxical low flow/low gradient severe AS (mean 25 mmHg, AVA 0.6 cm^2).   Admitted after TEE/LHC/RHC in 11/23 for aortic stenosis workup. TEE showed an abnormal trileaflet aortic valve with doming of the right coronary cusp. There was severe aortic stenosis with moderate aortic insufficiency. LHC showed nonobstructive CAD. RHC showed elevated right and left heart filling pressures with pulmonary venous hypertension. Admitted with acute HFpEF. Diuresed well with IV lasix , weight down 16 lbs during admission. Home diuretics regimen adjusted with torsemide  increased to 80 mg daily. She was referred to structural heart team for possible TAVR.   She saw Dr. Murriel who thought that she would be best-suited for TAVR.  She saw Dr Wendel who was concerned about the position of her RCA and worried about disruption of the ostium of the vessel with TAVR.  He recommended weight loss and SAVR.  She  agreed to start Mounjaro . Echo was repeated in 2/24, showing EF 55-60%, mild LVH, mild RV dysfunction, severe AS with AVA 0.8 cm^2 and mean gradient 33 mmHg, mild-moderate AI.  Echo 9/24 showed EF 60-65%, normal RV, severe aortic stenosis, calcified aortic valve with severe AS (mean gradient 38 mmHg, AVA 0.75 cm2)  Echo in 7/25 showed EF 60-65%, moderate LVH, normal RV, PASP 30 mmHg, mild-moderate AI, severe paradoxical low flow/low gradient AS  with mean gradient 34 mmHg, DI 0.23, AVA 0.72 cm^2, IVC normal.   Today she returns for HF follow up.  Weight is up about 10 lbs. She remains in atrial fibrillation. She is limited by knee pain, uses a cane to get around.  She has numbness and burning in her feet bilaterally. Generally not short of breath walking on flat ground, limited more by knee pain and neuropathy.  No lightheadedness.  Not very active but feels like her exertional symptoms have not changed.  No chest pain.   ECG (personally reviewed): atrial fibrillation rate 80  Labs (6/25): K 4.1, creatinine 1.19 Labs (8/25): LDL 97   PMH: 1. Obese 2. Atrial fibrillation: Paroxysmal => permanent.  Unable to cardiovert in 3/15 due to LAA thrombus noted on TEE. TEE in 6/15 without LAA thrombus.  Patient had cardioversion to NSR with difficulty but back in atrial fibrillation by 7/15 appt.  She was started on amiodarone  and cardioverted in 7/15.   - DCCV in 2/20 but back in atrial fibrillation by 3/20, has been in atrial fibrillation since that time.  3. H/o TKR 4. Nonischemic cardiomyopathy: TEE (3/15) with EF 25-30%, moderate MR, PA systolic pressure 51 mmHg, moderate to severe TR, LA thrombus was noted.   LHC (3/15) with 50% stenosis small PLV.  TEE (6/15) with EF 35-40%, diffuse hypokinesis, mild MR, moderate TR, +PFO, no LA appendage thrombus.  - Echo (10/15): with EF 60-65%.   - Echo (1/18): EF 60-65% Mild AS - Echo (8/19): EF 60%, moderate LVH, mild aortic stenosis.  - PYP scan in 4/19 was grade 3, strongly suggestive of transthyretin amyloidosis.  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  - Echo (11/21): EF 55-60%, mild LV dilation, RV normal, mild-moderate AS with mean gradient 17 mmHg and AVA 1.07 cm^2.  - Echo (10/22): EF 60-65%, normal RV, moderate AS mean gradient 20 mmHg with AVA 1.56 cm^2, mild AI.  - Echo (11/23): EF 55-60%, mild LVH, mildly decreased RV systolic function with normal RV size, PASP 47 mmHg, paradoxical low  flow/low gradient severe AS (mean 25 mmHg, AVA 0.6 cm^2). - TEE 04/14/22: EF 55-60%, RV okay, moderate LAE, moderate AI, severe AS with mean gradient of 40 mmHg, AVA by VTI 0.86 cm2 and 0.71 cm2 by planimetry - RHC (11/23): mean RA 16, PA 49/31 mean 37, mean PCWP 26, CI 2.47, PVR 1.7 WU.  - Echo (2/24): EF 55-60%, mild LVH, mild RV dysfunction, severe AS with AVA 0.8 cm^2 and mean gradient 33 mmHg, mild-moderate AI. - Echo (9/24): EF 60-65%, normal RV, severe aortic stenosis, calcified aortic valve with severe AS (mean gradient 38 mmHg, AVA 0.75 cm2) - Echo (7/25): EF 60-65%, moderate LVH, normal RV, PASP 30 mmHg, mild-moderate AI, severe paradoxical low flow/low gradient AS with mean gradient 34 mmHg, DI 0.23, AVA 0.72 cm^2, IVC normal.  5. CAD: Nonobstructive.  LHC (3/15) with 50% stenosis small PLV. - LHC 04/14/22: 40% stenosis distal LAD. 6. PFO 7. Left Breast mass: Was taken for lumpectomy in 1/18 but mass had resolved.  8. Aortic stenosis: Moderate on 10/22 echo.  - Paradoxical low flow/low gradient severe AS on 11/23 echo.  9. Carpal tunnel syndrome.  10. Vestibular schwannoma s/p gamma knife surgery at [facility].  11. OSA: Not currently using CPAP.  12. Hematochezia: C-scope/EGD in 7/21 showed only internal hemorrhoids.  13. 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   14. Aortic stenosis: Severe AS by TEE in 11/23 and echo in 2/24 and 7/25.    Current Outpatient Medications  Medication Sig Dispense Refill   acetaminophen  (TYLENOL ) 650 MG CR tablet Take 1,300 mg by mouth every 8 (eight) hours as needed for pain.      benzonatate (TESSALON) 100 MG capsule Take 100 mg by mouth 3 (three) times daily as needed.     Cholecalciferol  (VITAMIN D -3) 5000 UNITS TABS Take 5,000 Units by mouth daily.     Docusate Sodium (DSS) 100 MG CAPS Take by mouth as needed.     ELIQUIS  5 MG TABS tablet TAKE 1 TABLET BY MOUTH TWICE A DAY 60 tablet 1   ezetimibe  (ZETIA ) 10 MG tablet TAKE 1/2 TABLET BY  MOUTH DAILY 45 tablet 3   Ferrous Sulfate  27 MG TABS Take 27 mg by mouth 2 (two) times daily.      fluticasone furoate-vilanterol (BREO ELLIPTA) 100-25 MCG/INH AEPB Inhale 1 puff into the lungs every morning. As needed     JARDIANCE  10 MG TABS tablet TAKE 1 TABLET BY MOUTH EVERY DAY BEFORE BREAKFAST 90 tablet 0   ketoconazole (NIZORAL) 2 % cream Apply 1 application topically as needed.     metoprolol  succinate (TOPROL -XL) 100 MG 24 hr tablet TAKE 1 TABLET BY MOUTH TWICE A DAY 180 tablet 3   nitroGLYCERIN  (NITROSTAT ) 0.4 MG SL tablet Place 1 tablet (0.4 mg total) under the tongue every 5 (five) minutes as needed for chest pain. 30 tablet 1   potassium chloride  (KLOR-CON ) 10 MEQ tablet TAKE 7 TABLETS (70 MEQ TOTAL) BY MOUTH DAILY. 630 tablet 2   RESTASIS 0.05 % ophthalmic emulsion Place 1 drop into both eyes daily as needed.     rosuvastatin  (CRESTOR ) 40 MG tablet TAKE 1 TABLET BY MOUTH EVERYDAY AT BEDTIME 90 tablet 2   Tafamidis  Meglumine , Cardiac, (VYNDAQEL ) 20 MG CAPS Take 4 capsules (80 mg)  by mouth daily. 120 capsule 11   torsemide  (DEMADEX ) 20 MG tablet TAKE 4 TABLETS BY MOUTH EVERY DAY 360 tablet 1   Vitamin A 2400 MCG (8000 UT) CAPS Take 1 tablet by mouth daily.     vutrisiran  sodium (AMVUTTRA ) 25 MG/0.5ML syringe Inject 0.5 mLs (25 mg total) into the skin every 3 (three) months. 0.5 mL 3   tirzepatide  (MOUNJARO ) 15 MG/0.5ML Pen Inject 15 mg into the skin once a week. 2 mL 11   No current facility-administered medications for this encounter.    Allergies:   Tramadol and Spironolactone    Social History:  The patient  reports that she has never smoked. She has never used smokeless tobacco. She reports that she does not drink alcohol and does not use drugs.   Family History:  The patient's family history includes CVA in her mother.   ROS:  Please see the history of present illness.   All other systems are personally reviewed and negative.   VS   BP 120/82   Pulse 76   Wt (!) 150.9  kg (332 lb 9.6 oz)   SpO2 99%   BMI 53.68 kg/m   Wt Readings from Last 3 Encounters:  02/15/24 (!) 150.9 kg (332 lb 9.6 oz)  12/21/23 (!) 148.1 kg (326 lb 9.6 oz)  10/27/23 (!) 145.6 kg (321 lb)   PHYSICAL EXAM: General: NAD Neck: JVP 7-8 cm, no thyromegaly or thyroid  nodule.  Lungs: Clear to auscultation bilaterally with normal respiratory effort. CV: Nondisplaced PMI.  Heart irregular S1/S2, no S3/S4, 2/6 SEM RUSB (able to hear S2).  Trace ankle edema.  No carotid bruit.  Difficult to palpate pedal pulses.  Abdomen: Soft, nontender, no hepatosplenomegaly, no distention.  Skin: Intact without lesions or rashes.  Neurologic: Alert and oriented x 3.  Psych: Normal affect. Extremities: No clubbing or cyanosis.  HEENT: Normal.   ASSESSMENT AND PLAN: 1. 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  EF 35-40% on TEE in 6/15 but then EF back to 60-65% by 10/15 and remained normal on followup echoes up to 7/25.  LHC without obstructive coronary disease in 3/15 and again in 11/23.  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  Echo in 7/25 showed EF 60-65%, moderate LVH, normal RV, PASP 30 mmHg, mild-moderate AI, severe paradoxical low flow/low gradient AS with mean gradient 34 mmHg, DI 0.23, AVA 0.72 cm^2, IVC normal.  NYHA class II-III, symptoms confounded by limitation from knee pain and neuropathy. She does not look volume overloaded.  - Continue torsemide  80 mg daily.  BMET/BNP today.  - Continue Toprol  XL 100 mg bid. - Continue empagliflozin  10 mg daily.  2. 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  Atrial fibrillation and carpal tunnel syndrome, both of which she has, go along with amyloidosis.  Her history of orthostatic  symptoms as well as symptoms in toes and feet consistent with peripheral neuropathy are also likely related to amyloidosis.  - Continue tafamidis .   - Continue vutrisiran .   - She was seen by  Dr. 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 Given autosomal dominant condition, Jonalyn's daughter is at a 50% risk of inheriting the pathogenic variant in TTR (V142I). Genetic testing for the familial pathogenic variant was recommended but pt's daughter declined.  3. Atrial fibrillation: Permanent. Last DCCV in 2/20 but back in atrial fibrillation since then.  Dr. Kelsie saw, decided against atrial fibrillation ablation due to morbid obesity.  EP decided against Tikosyn due to compliance concerns. She failed amiodarone .  Rate is controlled.  - Continue Toprol  XL 100 mg bid.  - Continue Eliquis .   4. CAD: Mild, nonobstructive by cath 3/15 and again in 11/23. No chest pain.  - No ASA given Eliquis  use.   - Continue Crestor  40 mg daily and Zetia .  5. Bilateral carpal tunnel symptoms: Likely related to amyloidosis.  6. OSA: She says that she cannot tolerate CPAP due to tinnitus.  7.  HTN: BP controlled.  8.  Aortic stenosis: TEE 04/14/22 showed severe AS with mean gradient 40 mmHg, AVA 0.86 by VTI and 0.71 by planimetry with moderate aortic regurgitation. Echo in 2/24 with severe AS.  Echo in 7/25 with severe low flow/low gradient AS.  The AoV is trileaflet but the right sinus is effaced and the RCA could be jeopardized by TAVR.  She has been followed closely by Dr. Wendel in structural heart clinic. Given risky TAVR, we are waiting for clearly limiting symptoms before proceeding with TAVR.  -  Followup in 11/25 with Dr. Wendel with repeat echo.  9. Obesity: Body mass index is 53.68 kg/m.  - Continue Mounjaro .  - Discussed portion control.   Follow up in 4 months with APP  I spent 31 minutes reviewing chart, interacting with  patient, reviewed LVAD parameters, and managing orders.   Signed, Ezra Shuck, MD  02/15/2024  [facility] [address] [facility] [place] [address] [phone] (office) [phone] (fax)

## 2024-02-19 ENCOUNTER — Other Ambulatory Visit (HOSPITAL_COMMUNITY): Payer: Self-pay

## 2024-02-24 ENCOUNTER — Other Ambulatory Visit: Payer: Self-pay

## 2024-02-25 ENCOUNTER — Other Ambulatory Visit: Payer: Self-pay | Admitting: Cardiology

## 2024-02-26 ENCOUNTER — Other Ambulatory Visit: Payer: Self-pay

## 2024-02-26 NOTE — Progress Notes (Signed)
 Patient Satisfaction Survey Complete.

## 2024-02-26 NOTE — Progress Notes (Signed)
 Specialty Pharmacy Refill Coordination Note  Kimberly [name] is a 68 y.o. female contacted today regarding refills of specialty medication(s) Tafamidis  Meglumine  (Cardiac) (Vyndaqel )   Patient requested Delivery   Delivery date: 03/01/24   Verified address: [address]  [place] [address]   Medication will be filled on 02/29/24.

## 2024-02-29 ENCOUNTER — Other Ambulatory Visit: Payer: Self-pay

## 2024-03-03 ENCOUNTER — Other Ambulatory Visit: Payer: Self-pay

## 2024-03-08 ENCOUNTER — Other Ambulatory Visit (HOSPITAL_COMMUNITY): Payer: Self-pay | Admitting: Cardiology

## 2024-03-17 ENCOUNTER — Other Ambulatory Visit (HOSPITAL_COMMUNITY): Payer: Self-pay | Admitting: Cardiology

## 2024-03-22 ENCOUNTER — Ambulatory Visit (HOSPITAL_COMMUNITY)
Admission: RE | Admit: 2024-03-22 | Discharge: 2024-03-22 | Disposition: A | Discharge status: Home / Self Care | Source: Ambulatory Visit | Attending: Cardiology | Admitting: Cardiology

## 2024-03-22 DIAGNOSIS — D6869 Other thrombophilia: Secondary | ICD-10-CM

## 2024-03-22 DIAGNOSIS — E1169 Type 2 diabetes mellitus with other specified complication: Secondary | ICD-10-CM | POA: Diagnosis present

## 2024-03-22 DIAGNOSIS — I350 Nonrheumatic aortic (valve) stenosis: Secondary | ICD-10-CM

## 2024-03-22 DIAGNOSIS — I43 Cardiomyopathy in diseases classified elsewhere: Secondary | ICD-10-CM | POA: Diagnosis present

## 2024-03-22 DIAGNOSIS — I480 Paroxysmal atrial fibrillation: Secondary | ICD-10-CM

## 2024-03-22 DIAGNOSIS — E785 Hyperlipidemia, unspecified: Secondary | ICD-10-CM | POA: Diagnosis present

## 2024-03-22 DIAGNOSIS — I2510 Atherosclerotic heart disease of native coronary artery without angina pectoris: Secondary | ICD-10-CM

## 2024-03-22 DIAGNOSIS — I152 Hypertension secondary to endocrine disorders: Secondary | ICD-10-CM | POA: Diagnosis present

## 2024-03-22 DIAGNOSIS — E119 Type 2 diabetes mellitus without complications: Secondary | ICD-10-CM | POA: Diagnosis present

## 2024-03-22 DIAGNOSIS — Z6841 Body Mass Index (BMI) 40.0 and over, adult: Secondary | ICD-10-CM | POA: Diagnosis present

## 2024-03-22 DIAGNOSIS — E1159 Type 2 diabetes mellitus with other circulatory complications: Secondary | ICD-10-CM

## 2024-03-22 DIAGNOSIS — E854 Organ-limited amyloidosis: Secondary | ICD-10-CM

## 2024-03-22 LAB — ECHOCARDIOGRAM COMPLETE
AR max vel: 1.07 cm2
AV Area VTI: 1.07 cm2
AV Area mean vel: 0.9 cm2
AV Mean grad: 41.5 mmHg
AV Peak grad: 72.4 mmHg
Ao pk vel: 4.26 m/s
P 1/2 time: 443 ms
S' Lateral: 3.2 cm

## 2024-03-23 ENCOUNTER — Other Ambulatory Visit: Payer: Self-pay

## 2024-03-23 ENCOUNTER — Other Ambulatory Visit (HOSPITAL_COMMUNITY): Payer: Self-pay

## 2024-03-24 ENCOUNTER — Other Ambulatory Visit: Payer: Self-pay

## 2024-03-25 ENCOUNTER — Other Ambulatory Visit: Payer: Self-pay

## 2024-03-25 NOTE — Progress Notes (Signed)
 Specialty Pharmacy Refill Coordination Note  Kimberly [name] is a 68 y.o. female contacted today regarding refills of specialty medication(s) Tafamidis  Meglumine  (Cardiac) (Vyndaqel )   Patient requested Delivery   Delivery date: 03/31/24   Verified address: [address]  [place] [address]   Medication will be filled on: 03/30/24

## 2024-03-28 ENCOUNTER — Ambulatory Visit: Admitting: Cardiology

## 2024-03-28 ENCOUNTER — Ambulatory Visit: Admitting: Internal Medicine

## 2024-03-29 ENCOUNTER — Other Ambulatory Visit: Payer: Self-pay

## 2024-04-04 ENCOUNTER — Ambulatory Visit: Admitting: Cardiology

## 2024-04-07 NOTE — Progress Notes (Deleted)
 Patient ID: Josselyne Onofrio MRN: [id number] DOB/AGE: October 17, 1955 68 y.o.  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 female with the indicated medical history here for recommendations regarding her severe aortic valvular disease.  The patient recently underwent an elective TEE to evaluate her aortic valve stenosis.  She was found to be volume overloaded at time and admitted for diuresis.  She lost 16 pounds with IV Lasix  and was discharged home with a increased dose of torsemide .  The patient endorses NYHA class II symptoms of shortness of breath.  She feels however better than she did prior to the hospital.  She endorses stable two-pillow orthopnea.  She denies any peripheral edema.  She denies any exertional angina.  She denies any presyncope or syncope.  In terms of her dental health she has not seen a dentist in some time and fears that she might have teeth that need to be extracted.  She has problems with knee pain and so she is not very active.  She tells me when she walks upstairs she will get short of breath.  She has not had any  shortness of breath at rest since discharge.  She occasionally does develop vertigo likely related to her schwannoma.  January 2024: In the interim the patient was seen by cardiothoracic surgery and thought to be best served by a nonsurgical approach to treat her aortic stenosis.  We reviewed her imaging and she has a very effaced right sinus with a dominant right coronary artery which could be jeopardized.  She seems to have femoral access by CT scan with no high-grade obstructions.  The patient is here today and is doing relatively well.  She is able to do her activities of daily living without any significant shortness of breath.  Thing that limits her is knee pain.  She is able to walk up and down the [place] at a grocery store but has to stop because of knee pain.  She denies any paroxysmal nocturnal dyspnea, orthopnea, presyncope, or syncope.  She fortunately has not required any emergency room visits or hospitalizations.  Plan: Refer to pharmacy regarding Ozempic  or Mounjaro , obtain echocardiogram, follow-up in 2 months.  March 2024: In the interim the patient had her semaglutide  dose increased.  It does not look like she had a discussion with pharmacy regarding Mounjaro  or Wegovy .  Since I saw her.  She denies any significantly increasing shortness of breath.  She does get lightheaded when she goes from laying to sitting in the morning.  She does not have any other episodes of presyncope or syncope.  She denies any chest pain and her chronic shortness of breath is relatively  unchanged.  She is otherwise without significant cardiovascular complaints today.  Plan: Stressed that patient anatomy was suboptimal for transcatheter attic valve replacement given jeopardy of right coronary artery; stressed need to lose weight to become a more optimal surgical candidate.  October 2024: There are interim the patient started Mounjaro  for weight loss.  She has lost about 27 pounds.  Her Mounjaro  dose was recently  increased.  There was time when she was not able to get the medication may be over 1 or 2 weeks but this has been rectified.  She feels less short of breath.   She is able to walk without having to stop as frequently as before.  She denies any exertional angina.  She has had some lightheadedness when going from sitting to standing.  She does have chronic lightheadedness with her schwannoma but this typically happens when she is lying down.  She fortunately has not required any emergency room visits or hospitalizations.  She generally feels well.  Plan: Continue current therapy and return for follow-up in 3 months with another echocardiogram.  1/25: In the interim the patient was seen by cardiology and had lost a total of 60 pounds since starting Mounjaro .  She is without cardiovascular complaints and her ambulation was limited by bilateral knee osteoarthritis.  An echocardiogram demonstrated paradoxical low-flow low gradient aortic stenosis.  The patient is doing very well.  She is lost close to 60 pounds since starting Mounjaro .  She denies any dyspnea.  She is able to walk much farther than she could before I met her.  She denies any chest pain.  She has had no presyncope or syncope.  Her biggest issue seems to be stemming from her known schwannoma.  She sometimes gets dizzy when she moves her head to the left when she is laying in bed.  This does not not occur when she is up and moving.  It also is associated with bothersome tinnitus.  She will be following up in [place] about this issue.  Plan: Continue current therapy.  August 2025:  Patient consents to use of AI scribe. The patient returns for routine follow-up.   She reports no severe shortness of breath or chest pain and states she can walk long distances without stopping. However, she experiences occasional dizziness, primarily when sitting, which she attributes to a tumor rather than her heart condition.  She is currently on Mounjaro  for  weight management and has lost approximately 40 to 50 pounds since starting the medication. Her weight fluctuates between 320 and 316 pounds. She notes discrepancies between her home scale and the doctor's office scale and expresses uncertainty about the rate of her weight loss. She reports no issues with the Mounjaro  injections.  Her knees and feet are more bothersome than her heart, impacting her ability to walk. She lives alone, is currently disabled, and not working. No chest pain or blacking out spells. She does report occasional dizziness, primarily when sitting. Reports good sleep.  Plan: Follow-up in 3 months; check lipid panel and LFTs.  November 2025:  Patient consents to use of AI scribe. Patient is LDL was above goal.  She was referred to [facility].D. for further management.  She was seen by advanced heart failure in September.  Her BNP was only mildly elevated.  No changes were made to her medical regimen.  An echocardiogram done this month demonstrated an increase in her maximal velocity across the aortic valve to 4.2.  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/2019   left    Past Surgical History:  Procedure Laterality Date   CARDIOVERSION N/A 07/29/2013   Procedure: CARDIOVERSION;  Surgeon: Leim VEAR Moose, MD;  Location: [facility] ENDOSCOPY;  Service: Cardiovascular;  Laterality: N/A;   CARDIOVERSION N/A 11/11/2013   Procedure: CARDIOVERSION;  Surgeon: Ezra GORMAN Shuck, MD;  Location: [facility] ENDOSCOPY;  Service: Cardiovascular;  Laterality: N/A;   CARDIOVERSION N/A 12/08/2013   Procedure: CARDIOVERSION;  Surgeon: Ezra GORMAN Shuck, MD;  Location: [facility] ENDOSCOPY;   Service: Cardiovascular;  Laterality: N/A;   CARDIOVERSION N/A 06/30/2018   Procedure: CARDIOVERSION;  Surgeon: Shuck Ezra GORMAN, MD;  Location: [facility] ENDOSCOPY;  Service: Cardiovascular;  Laterality: N/A;   JOINT REPLACEMENT     LEFT HEART CATHETERIZATION WITH CORONARY ANGIOGRAM N/A 08/01/2013   Procedure: LEFT HEART CATHETERIZATION WITH CORONARY ANGIOGRAM;  Surgeon: Ozell JONETTA Fell, MD;  Location: [facility] CATH LAB;  Service: Cardiovascular;  Laterality: N/A;   RIGHT/LEFT HEART CATH AND CORONARY ANGIOGRAPHY N/A 04/14/2022   Procedure: RIGHT/LEFT HEART CATH AND CORONARY ANGIOGRAPHY;  Surgeon: Shuck Ezra GORMAN, MD;  Location: [facility] INVASIVE CV LAB;  Service: Cardiovascular;  Laterality: N/A;   TEE WITHOUT CARDIOVERSION N/A 07/29/2013   Procedure: TRANSESOPHAGEAL ECHOCARDIOGRAM (TEE);  Surgeon: Leim VEAR Moose, MD;  Location: [facility] ENDOSCOPY;  Service: Cardiovascular;  Laterality: N/A;   TEE WITHOUT CARDIOVERSION N/A 11/11/2013   Procedure: TRANSESOPHAGEAL ECHOCARDIOGRAM (TEE);  Surgeon: Ezra GORMAN Shuck, MD;  Location: [facility] ENDOSCOPY;  Service: Cardiovascular;  Laterality: N/A;   TEE WITHOUT CARDIOVERSION N/A 04/14/2022   Procedure: TRANSESOPHAGEAL ECHOCARDIOGRAM (TEE);  Surgeon: Shuck Ezra GORMAN, MD;  Location: [facility] ENDOSCOPY;  Service: Cardiovascular;  Laterality: N/A;    Family History  Problem Relation Age of Onset   CVA Mother     Social History   Socioeconomic History   Marital status: Single    Spouse name: Not on file   Number of children: Not on file   Years of education: Not on file   Highest education level: Not on file  Occupational History   Not on file  Tobacco Use   Smoking status: Never   Smokeless tobacco: Never  Vaping Use   Vaping status: Never Used  Substance and Sexual Activity   Alcohol use: No    Alcohol/week: 0.0 standard drinks of alcohol   Drug use: No   Sexual activity: Not Currently  Other Topics Concern   Not on file  Social History Narrative   Right handed    One  story home alone   Caffeine 2 cups a day   Social Drivers of Health   Financial Resource Strain: Not on file  Food Insecurity: No Food Insecurity (04/14/2022)   Hunger Vital Sign    Worried About Running Out of Food in the Last Year: Never true    Ran Out of Food in the Last Year: Never true  Transportation Needs: No Transportation Needs (04/14/2022)   PRAPARE - Administrator, Civil Service (Medical): No    Lack of Transportation (Non-Medical): No  Physical Activity: Not on file  Stress: Not on file  Social Connections:  Not on file  Intimate Partner Violence: Not At Risk (04/14/2022)   Humiliation, Afraid, Rape, and Kick questionnaire    Fear of Current or Ex-Partner: No    Emotionally Abused: No    Physically Abused: No    Sexually Abused: No     Prior to Admission medications   Medication Sig Start Date End Date Taking? Authorizing Provider  acetaminophen  (TYLENOL ) 650 MG CR tablet Take 1,300 mg by mouth every 8 (eight) hours as needed for pain.     [provider]  benzonatate (TESSALON) 100 MG capsule Take 100 mg by mouth 3 (three) times daily as needed. 04/01/21   [provider]  Cholecalciferol  (VITAMIN D -3) 5000 UNITS TABS Take 5,000 Units by mouth daily.    [provider]  Docusate Sodium (DSS) 100 MG CAPS Take by mouth as needed.    [provider]  ELIQUIS  5 MG TABS tablet TAKE 1 TABLET (5 MG TOTAL) BY MOUTH 2 (TWO) TIMES DAILY. Patient taking differently: Take 5 mg by mouth 2 (two) times daily. 06/07/21   Rolan Ezra RAMAN, MD  ezetimibe  (ZETIA ) 10 MG tablet Take 0.5 tablets (5 mg total) by mouth daily. Patient not taking: Reported on 04/14/2022 11/28/21 11/28/22  Marcine Caffie HERO, PA-C  Ferrous Sulfate  27 MG TABS Take 27 mg by mouth 2 (two) times daily.     [provider]  fluticasone furoate-vilanterol (BREO ELLIPTA) 100-25 MCG/INH AEPB Inhale 1 puff into the lungs every morning. As needed 06/01/20   [provider]  JARDIANCE  10 MG TABS tablet TAKE 1 TABLET BY MOUTH DAILY BEFORE BREAKFAST. Patient taking differently: Take 10 mg by mouth daily. 07/22/21   Milford, Harlene HERO, FNP  ketoconazole (NIZORAL) 2 % cream Apply 1 application topically as needed. 12/06/19   [provider]  metoprolol  succinate (TOPROL -XL) 100 MG 24 hr tablet Take 1 tablet (100 mg total) by mouth 2 (two) times daily. 12/09/21   Rolan Ezra RAMAN, MD  nitroGLYCERIN  (NITROSTAT ) 0.4 MG SL tablet Place 1 tablet (0.4 mg total) under the tongue every 5 (five) minutes as needed for chest pain. 04/21/19   Rolan Ezra RAMAN, MD  Omega-3 1000 MG CAPS Take 1,000 mg by mouth every morning.    [provider]  potassium chloride  (KLOR-CON ) 10 MEQ tablet Take 5 tablets (50 mEq total) by mouth daily. 04/08/22   Rolan Ezra RAMAN, MD  RESTASIS 0.05 % ophthalmic emulsion Place 1 drop into both eyes daily as needed. 06/13/19   [provider]  rosuvastatin  (CRESTOR ) 40 MG tablet Take 1 tablet (40 mg total) by mouth at bedtime. 04/02/20   Rolan Ezra RAMAN, MD  Semaglutide  (RYBELSUS ) 3 MG TABS Take 1 tablet by mouth daily. 04/17/22   Rolan Ezra RAMAN, MD  Tafamidis  Meglumine , Cardiac, (VYNDAQEL ) 20 MG CAPS Take 4 capsules (80 mg)  by mouth daily. Patient taking differently: Take 40 mg by mouth in the morning and at bedtime. 10/02/21   Rolan Ezra RAMAN, MD  torsemide  (DEMADEX ) 20 MG tablet Take 4 tablets (80 mg total) by mouth daily. 04/16/22   Clegg, Amy D, NP    Allergies  Allergen Reactions   Tramadol Nausea And Vomiting   Spironolactone  Itching    REVIEW OF SYSTEMS:  General: no fevers/chills/night sweats Eyes: no blurry vision, diplopia, or amaurosis ENT: no sore throat or hearing loss Resp: no cough, wheezing, or hemoptysis CV: no edema or palpitations GI: no abdominal pain, nausea, vomiting, diarrhea, or constipation GU: no  dysuria, frequency, or hematuria Skin: no rash Neuro: no headache, numbness,  tingling, or weakness of extremities Musculoskeletal: no joint pain or swelling Heme: no bleeding, DVT, or easy bruising Endo: no polydipsia or polyuria  There were no vitals taken for this visit.  PHYSICAL EXAM: GEN:  AO x 3 in no acute distress HEENT: normal Dentition: Normal Neck: JVP normal. +2carotid upstrokes without bruits. No thyromegaly. Lungs: equal expansion, clear bilaterally CV: Apex is discrete and nondisplaced, RRR with very distant heart sounds: I cannot hear a discrete murmur Abd: soft, non-tender, non-distended; no bruit; positive bowel sounds Ext: no edema, ecchymoses, or cyanosis Vascular: 2+ femoral pulses, 2+ radial pulses       Skin: warm and dry without rash Neuro: CN II-XII grossly intact; motor and sensory grossly intact    DATA AND STUDIES:  EKG: 2025 Atrial fibrillation without conduction disease  TTE:  February 2024  1. Left ventricular ejection fraction, by estimation, is 55 to 60%. The  left ventricle has normal function. The left ventricle demonstrates  regional wall motion abnormalities with possible basal inferior  hypokinesis. There is mild concentric left  ventricular hypertrophy. Left ventricular diastolic parameters are  indeterminate.   2. Right ventricular systolic function is mildly reduced. The right  ventricular size is mildly enlarged. Tricuspid regurgitation signal is  inadequate for assessing PA pressure.   3. Left atrial size was mildly dilated.   4. Right atrial size was mildly dilated.   5. The mitral valve is normal in structure. Mild mitral valve  regurgitation. No evidence of mitral stenosis.   6. The aortic valve is tricuspid. There is severe calcifcation of the  aortic valve. Aortic valve regurgitation is mild to moderate. Paradoxical  low flow/low gradient severe aortic valve stenosis. Aortic valve area, by  VTI measures 0.80 cm. Aortic valve   mean gradient measures 33.0 mmHg.   7. The inferior vena cava is dilated  in size with >50% respiratory  variability, suggesting right atrial pressure of 8 mmHg.   8. The patient was in atrial fibrillation.   CARDIAC CATH: November 2023 1. Elevated right and left heart filling pressures.  2. Pulmonary venous hypertension. 3. Peak to peak aortic valve gradient 18 mmHg 4. Nonobstructive mild CAD.   STS RISK CALCULATOR: Pending  NHYA CLASS: ***    ASSESSMENT AND PLAN:   Nonrheumatic aortic valve stenosis  Mild CAD  PAF (paroxysmal atrial fibrillation) (HCC)  Secondary hypercoagulable state  Cardiac amyloidosis (HCC)  BMI 50.0-59.9, adult (HCC)  Type 2 diabetes mellitus without complication, without long-term current use of insulin (HCC)  Hypertension associated with diabetes (HCC)  Hyperlipidemia associated with type 2 diabetes mellitus (HCC)  Aortic stenosis: Echocardiogram demonstrates progression of disease.  Her dominant right coronary sinus is very effaced.  Her coronary height however is around 17 mm.  The contrast injection on her last CT scan was somewhat poor.  Will refer for another TAVR protocol CTA to evaluate further.*** Coronary artery disease:Continue Eliquis  5 mg twice daily, rosuvastatin  40 mg daily, as needed nitroglycerin . PAF: Continue Eliquis  5 mg twice daily, Toprol  100 mg daily Secondary hypercoagulable state: Continue Eliquis  5 mg twice daily Cardiac amyloidosis: Continue tafamidis  80 mg daily and vutrisiran  25 mg every 3 months Elevated BMI: Continue Mounjaro  15 mg q. Weekly T2DM: Continue Eliquis  5 mg twice daily, Jardiance  10 mg, rosuvastatin  40 mg.   Hypertension: Continue Toprol  100 mg twice daily.*** Hyperlipidemia: Continue rosuvastatin  40 mg daily and Zetia  10 mg daily.  LDL  in August of this year was 62 y; given elevated LP(a) goal LDL is less than 55.  If above goal will refer to pharmacy for further recommendations.  I spent 42 minutes reviewing all clinical data during and prior to this visit including all  relevant imaging studies, laboratories, clinical information from other health systems and prior notes from both Cardiology and other specialties, interviewing the patient, conducting a complete physical examination, and coordinating care in order to formulate a comprehensive and personalized evaluation and treatment plan.    Ryon Layton K Aj Crunkleton, MD  04/07/2024 12:46 PM    [facility] [address] [place], [place], [place]  [address] Phone: [phone]; Fax: [phone]

## 2024-04-11 ENCOUNTER — Telehealth: Payer: Self-pay | Admitting: Internal Medicine

## 2024-04-11 ENCOUNTER — Ambulatory Visit: Admitting: Internal Medicine

## 2024-04-11 DIAGNOSIS — I350 Nonrheumatic aortic (valve) stenosis: Secondary | ICD-10-CM

## 2024-04-11 DIAGNOSIS — E1159 Type 2 diabetes mellitus with other circulatory complications: Secondary | ICD-10-CM

## 2024-04-11 DIAGNOSIS — D6869 Other thrombophilia: Secondary | ICD-10-CM

## 2024-04-11 DIAGNOSIS — E1169 Type 2 diabetes mellitus with other specified complication: Secondary | ICD-10-CM

## 2024-04-11 DIAGNOSIS — E119 Type 2 diabetes mellitus without complications: Secondary | ICD-10-CM

## 2024-04-11 DIAGNOSIS — Z6841 Body Mass Index (BMI) 40.0 and over, adult: Secondary | ICD-10-CM

## 2024-04-11 DIAGNOSIS — I480 Paroxysmal atrial fibrillation: Secondary | ICD-10-CM

## 2024-04-11 DIAGNOSIS — I2510 Atherosclerotic heart disease of native coronary artery without angina pectoris: Secondary | ICD-10-CM

## 2024-04-11 DIAGNOSIS — I43 Cardiomyopathy in diseases classified elsewhere: Secondary | ICD-10-CM

## 2024-04-11 NOTE — Telephone Encounter (Signed)
 Attempted to call pt, unable to reach. LMTCB to discuss rescheduling pt appt on 12/31. Pt will need to be switched to Dr. Parry schedule to continue TAVR discussion (pt doesn't need to be placed in a structural appt slot, can be placed in a normal appt slot).

## 2024-04-11 NOTE — Telephone Encounter (Signed)
 Appt was for 3 months F/U to reassess for TAVR. Patient will need to see Dr. Wendel, not APP.  Will forward to Dr. Parry nurse to reschedule appt with APP on 12/31 to see Dr. Wendel instead.

## 2024-04-11 NOTE — Telephone Encounter (Signed)
 Pt is sick and rescheduled todays appt. Rescheduled with HILARIO Ford 12/31, but wanted to know if she should see doctor specifically. Please advise.

## 2024-04-18 NOTE — Progress Notes (Unsigned)
 Patient ID: Kimberly [name] MRN: [id number] DOB/AGE: April 07, 1956 68 y.o.  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 likely in jeopardy with TAVR AVA 0.72, MG 34, SVI 25, DI 0.23, V-max 3.8, EF 60 to 65% c/w PLFLGAS TTE July 2025  AVA 1.07, MG 41, V-max 4.2, EF 60 to 65% TTE November 2025 Coronary artery disease 40% distal LAD cath 2023 Paroxysmal atrial fibrillation On apixaban  Nonischemic cardiomyopathy EF 35 to 40% 2015 now normalized Cardiac amyloidosis PYP scan 2019 On tafamidis  and vutrisiran  BMI over 50 On Mounjaro  Type 2 diabetes mellitus Not on insulin Yeast infection November 2025 >> SGLT2i d/c'd Hypertension Hyperlipidemia LP(a) 458   HISTORY OF PRESENT ILLNESS: December 2023 consultation: The patient is a 68 y.o. female with the indicated medical history here for recommendations regarding her severe aortic valvular disease.  The patient recently underwent an elective TEE to evaluate her aortic valve stenosis.  She was found to be volume overloaded at time and admitted for diuresis.  She lost 16 pounds with IV Lasix  and was discharged home with a increased dose of torsemide .  The patient endorses NYHA class II symptoms of shortness of breath.  She feels however better than she did prior to the hospital.  She endorses stable two-pillow orthopnea.  She denies any peripheral edema.  She denies any exertional angina.  She denies any presyncope or syncope.  In terms of her dental health she has not seen a dentist in some time and fears that she might have teeth that need to be extracted.  She has problems with knee pain and so she is not very active.  She tells me when she walks upstairs she  will get short of breath.  She has not had any shortness of breath at rest since discharge.  She occasionally does develop vertigo likely related to her schwannoma.  January 2024: In the interim the patient was seen by cardiothoracic surgery and thought to be best served by a nonsurgical approach to treat her aortic stenosis.  We reviewed her imaging and she has a very effaced right sinus with a dominant right coronary artery which could be jeopardized.  She seems to have femoral access by CT scan with no high-grade obstructions.  The patient is here today and is doing relatively well.  She is able to do her activities of daily living without any significant shortness of breath.  Thing that limits her is knee pain.  She is able to walk up and down the [place] at a grocery store but has to stop because of knee pain.  She denies any paroxysmal nocturnal dyspnea, orthopnea, presyncope, or syncope.  She fortunately has not required any emergency room visits or hospitalizations.  Plan: Refer to pharmacy regarding Ozempic  or Mounjaro , obtain echocardiogram, follow-up in 2 months.  March 2024: In the interim the patient had her semaglutide  dose increased.  It does not look like she had a discussion with pharmacy regarding Mounjaro  or Wegovy .  Since I saw her.  She denies any significantly increasing shortness of breath.  She does get lightheaded when she goes from laying to sitting in the morning.  She does not have any other episodes of presyncope or syncope.  She denies any chest pain and  her chronic shortness of breath is relatively unchanged.  She is otherwise without significant cardiovascular complaints today.  Plan: Stressed that patient anatomy was suboptimal for transcatheter attic valve replacement given jeopardy of right coronary artery; stressed need to lose weight to become a more optimal surgical candidate.  October 2024: There are interim the patient started Mounjaro  for weight loss.  She has lost about  27 pounds.  Her Mounjaro  dose was recently increased.  There was time when she was not able to get the medication may be over 1 or 2 weeks but this has been rectified.  She feels less short of breath.   She is able to walk without having to stop as frequently as before.  She denies any exertional angina.  She has had some lightheadedness when going from sitting to standing.  She does have chronic lightheadedness with her schwannoma but this typically happens when she is lying down.  She fortunately has not required any emergency room visits or hospitalizations.  She generally feels well.  Plan: Continue current therapy and return for follow-up in 3 months with another echocardiogram.  1/25: In the interim the patient was seen by cardiology and had lost a total of 60 pounds since starting Mounjaro .  She is without cardiovascular complaints and her ambulation was limited by bilateral knee osteoarthritis.  An echocardiogram demonstrated paradoxical low-flow low gradient aortic stenosis.  The patient is doing very well.  She is lost close to 60 pounds since starting Mounjaro .  She denies any dyspnea.  She is able to walk much farther than she could before I met her.  She denies any chest pain.  She has had no presyncope or syncope.  Her biggest issue seems to be stemming from her known schwannoma.  She sometimes gets dizzy when she moves her head to the left when she is laying in bed.  This does not not occur when she is up and moving.  It also is associated with bothersome tinnitus.  She will be following up in [place] about this issue.  Plan: Continue current therapy.  August 2025:  Patient consents to use of AI scribe. The patient returns for routine follow-up.   She reports no severe shortness of breath or chest pain and states she can walk long distances without stopping. However, she experiences occasional dizziness, primarily when sitting, which she attributes to a tumor rather than her heart  condition.  She is currently on Mounjaro  for weight management and has lost approximately 40 to 50 pounds since starting the medication. Her weight fluctuates between 320 and 316 pounds. She notes discrepancies between her home scale and the doctor's office scale and expresses uncertainty about the rate of her weight loss. She reports no issues with the Mounjaro  injections.  Her knees and feet are more bothersome than her heart, impacting her ability to walk. She lives alone, is currently disabled, and not working. No chest pain or blacking out spells. She does report occasional dizziness, primarily when sitting. Reports good sleep.  Plan: Follow-up in 3 months; check lipid panel and LFTs.  November 2025:  Patient consents to use of AI scribe. Patient is LDL was above goal.  She was referred to [facility].D. for further management.  She was seen by advanced heart failure in September.  Her BNP was only mildly elevated.  No changes were made to her medical regimen.  An echocardiogram done this month demonstrated an increase in her maximal velocity across the aortic valve to 4.2.  Patient also has a dental extraction scheduled for 05/06/24.  She is concerned about the impact of having no teeth and mentions that dentures will be provided. No issues with bleeding while on Eliquis .  Her breathing is stable, with noted improvement in her ability to walk longer distances without significant shortness of breath. However, she has two bad knees and legs, contributing to her mobility issues. She is aware of the need for potential knee replacement in the future.  She is on the maximum dose of rosuvastatin  and Zetia  for cholesterol management, with a noted high cholesterol level and elevated lipoprotein little a.  She reports a recent rash under her breast, which she attributes to Jardiance . The rash appeared about two weeks ago and has improved with ketoconazole treatment. She describes the rash as dark red.  She  experiences numbness in her hands and feet, which she attributes to her existing medical conditions. This affects her ability to perform tasks like cleaning the house. No significant shortness of breath.  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   left    Past Surgical History:  Procedure Laterality Date   CARDIOVERSION N/A 07/29/2013   Procedure: CARDIOVERSION;  Surgeon: Leim VEAR Moose, MD;  Location: [facility] ENDOSCOPY;  Service: Cardiovascular;  Laterality: N/A;   CARDIOVERSION N/A 11/11/2013   Procedure: CARDIOVERSION;  Surgeon: Ezra GORMAN Shuck, MD;  Location: [facility] ENDOSCOPY;  Service: Cardiovascular;  Laterality: N/A;   CARDIOVERSION N/A 12/08/2013   Procedure: CARDIOVERSION;  Surgeon: Ezra GORMAN Shuck, MD;  Location: [facility] ENDOSCOPY;  Service: Cardiovascular;  Laterality: N/A;   CARDIOVERSION N/A 06/30/2018   Procedure: CARDIOVERSION;  Surgeon: Shuck Ezra GORMAN, MD;  Location: [facility] ENDOSCOPY;  Service: Cardiovascular;  Laterality: N/A;   JOINT REPLACEMENT     LEFT HEART CATHETERIZATION WITH CORONARY ANGIOGRAM N/A 08/01/2013   Procedure: LEFT HEART CATHETERIZATION WITH CORONARY ANGIOGRAM;  Surgeon: Ozell JONETTA Fell, MD;  Location: [facility] CATH LAB;  Service: Cardiovascular;  Laterality: N/A;   RIGHT/LEFT HEART CATH AND CORONARY ANGIOGRAPHY N/A 04/14/2022   Procedure: RIGHT/LEFT HEART CATH AND CORONARY ANGIOGRAPHY;  Surgeon: Shuck Ezra GORMAN, MD;  Location: [facility] INVASIVE CV LAB;  Service: Cardiovascular;  Laterality: N/A;   TEE WITHOUT CARDIOVERSION N/A 07/29/2013   Procedure: TRANSESOPHAGEAL ECHOCARDIOGRAM (TEE);  Surgeon: Leim VEAR Moose, MD;  Location: [facility] ENDOSCOPY;   Service: Cardiovascular;  Laterality: N/A;   TEE WITHOUT CARDIOVERSION N/A 11/11/2013   Procedure: TRANSESOPHAGEAL ECHOCARDIOGRAM (TEE);  Surgeon: Ezra GORMAN Shuck, MD;  Location: [facility] ENDOSCOPY;  Service: Cardiovascular;  Laterality: N/A;   TEE WITHOUT CARDIOVERSION N/A 04/14/2022   Procedure: TRANSESOPHAGEAL ECHOCARDIOGRAM (TEE);  Surgeon: Shuck Ezra GORMAN, MD;  Location: [facility] ENDOSCOPY;  Service: Cardiovascular;  Laterality: N/A;    Family History  Problem Relation Age of Onset   CVA Mother     Social History   Socioeconomic History   Marital status: Single    Spouse name: Not on file   Number of children: Not on file   Years of education: Not on file   Highest education level: Not on file  Occupational History   Not  on file  Tobacco Use   Smoking status: Never   Smokeless tobacco: Never  Vaping Use   Vaping status: Never Used  Substance and Sexual Activity   Alcohol use: No    Alcohol/week: 0.0 standard drinks of alcohol   Drug use: No   Sexual activity: Not Currently  Other Topics Concern   Not on file  Social History Narrative   Right handed    One story home alone   Caffeine 2 cups a day   Social Drivers of Health   Financial Resource Strain: Not on file  Food Insecurity: No Food Insecurity (04/14/2022)   Hunger Vital Sign    Worried About Running Out of Food in the Last Year: Never true    Ran Out of Food in the Last Year: Never true  Transportation Needs: No Transportation Needs (04/14/2022)   PRAPARE - Administrator, Civil Service (Medical): No    Lack of Transportation (Non-Medical): No  Physical Activity: Not on file  Stress: Not on file  Social Connections: Not on file  Intimate Partner Violence: Not At Risk (04/14/2022)   Humiliation, Afraid, Rape, and Kick questionnaire    Fear of Current or Ex-Partner: No    Emotionally Abused: No    Physically Abused: No    Sexually Abused: No     Prior to Admission medications   Medication Sig Start  Date End Date Taking? Authorizing Provider  acetaminophen  (TYLENOL ) 650 MG CR tablet Take 1,300 mg by mouth every 8 (eight) hours as needed for pain.     [provider]  benzonatate (TESSALON) 100 MG capsule Take 100 mg by mouth 3 (three) times daily as needed. 04/01/21   [provider]  Cholecalciferol  (VITAMIN D -3) 5000 UNITS TABS Take 5,000 Units by mouth daily.    [provider]  Docusate Sodium (DSS) 100 MG CAPS Take by mouth as needed.    [provider]  ELIQUIS  5 MG TABS tablet TAKE 1 TABLET (5 MG TOTAL) BY MOUTH 2 (TWO) TIMES DAILY. Patient taking differently: Take 5 mg by mouth 2 (two) times daily. 06/07/21   Rolan Ezra RAMAN, MD  ezetimibe  (ZETIA ) 10 MG tablet Take 0.5 tablets (5 mg total) by mouth daily. Patient not taking: Reported on 04/14/2022 11/28/21 11/28/22  Marcine Caffie HERO, PA-C  Ferrous Sulfate  27 MG TABS Take 27 mg by mouth 2 (two) times daily.     [provider]  fluticasone furoate-vilanterol (BREO ELLIPTA) 100-25 MCG/INH AEPB Inhale 1 puff into the lungs every morning. As needed 06/01/20   [provider]  JARDIANCE  10 MG TABS tablet TAKE 1 TABLET BY MOUTH DAILY BEFORE BREAKFAST. Patient taking differently: Take 10 mg by mouth daily. 07/22/21   Milford, Harlene HERO, FNP  ketoconazole (NIZORAL) 2 % cream Apply 1 application topically as needed. 12/06/19   [provider]  metoprolol  succinate (TOPROL -XL) 100 MG 24 hr tablet Take 1 tablet (100 mg total) by mouth 2 (two) times daily. 12/09/21   Rolan Ezra RAMAN, MD  nitroGLYCERIN  (NITROSTAT ) 0.4 MG SL tablet Place 1 tablet (0.4 mg total) under the tongue every 5 (five) minutes as needed for chest pain. 04/21/19   Rolan Ezra RAMAN, MD  Omega-3 1000 MG CAPS Take 1,000 mg by mouth every morning.    [provider]  potassium chloride  (KLOR-CON ) 10 MEQ tablet Take 5 tablets (50 mEq total) by mouth daily. 04/08/22   Rolan Ezra RAMAN, MD  RESTASIS 0.05 %  ophthalmic  emulsion Place 1 drop into both eyes daily as needed. 06/13/19   [provider]  rosuvastatin  (CRESTOR ) 40 MG tablet Take 1 tablet (40 mg total) by mouth at bedtime. 04/02/20   Rolan Ezra RAMAN, MD  Semaglutide  (RYBELSUS ) 3 MG TABS Take 1 tablet by mouth daily. 04/17/22   Rolan Ezra RAMAN, MD  Tafamidis  Meglumine , Cardiac, (VYNDAQEL ) 20 MG CAPS Take 4 capsules (80 mg)  by mouth daily. Patient taking differently: Take 40 mg by mouth in the morning and at bedtime. 10/02/21   Rolan Ezra RAMAN, MD  torsemide  (DEMADEX ) 20 MG tablet Take 4 tablets (80 mg total) by mouth daily. 04/16/22   Clegg, Amy D, NP    Allergies  Allergen Reactions   Jardiance  [Empagliflozin ] Itching    Yeast infection   Tramadol Nausea And Vomiting   Spironolactone  Itching    REVIEW OF SYSTEMS:  General: no fevers/chills/night sweats Eyes: no blurry vision, diplopia, or amaurosis ENT: no sore throat or hearing loss Resp: no cough, wheezing, or hemoptysis CV: no edema or palpitations GI: no abdominal pain, nausea, vomiting, diarrhea, or constipation GU: no dysuria, frequency, or hematuria Skin: no rash Neuro: no headache, numbness, tingling, or weakness of extremities Musculoskeletal: no joint pain or swelling Heme: no bleeding, DVT, or easy bruising Endo: no polydipsia or polyuria  BP 116/68   Pulse 96   Ht 5' 6 (1.676 m)   Wt (!) 330 lb (149.7 kg)   SpO2 96%   BMI 53.26 kg/m   PHYSICAL EXAM: GEN:  AO x 3 in no acute distress HEENT: normal Dentition: Normal Neck: JVP normal. +2carotid upstrokes without bruits. No thyromegaly. Lungs: equal expansion, clear bilaterally CV: Apex is discrete and nondisplaced, RRR with very distant heart sounds: I cannot hear a discrete murmur Abd: soft, non-tender, non-distended; no bruit; positive bowel sounds Ext: no edema, ecchymoses, or cyanosis Vascular: 2+ femoral pulses, 2+ radial pulses       Skin: warm and dry without rash Neuro: CN II-XII  grossly intact; motor and sensory grossly intact    DATA AND STUDIES:  EKG: EKG today that I reviewed demonstrates atrial fibrillation  TTE:  February 2024  1. Left ventricular ejection fraction, by estimation, is 55 to 60%. The  left ventricle has normal function. The left ventricle demonstrates  regional wall motion abnormalities with possible basal inferior  hypokinesis. There is mild concentric left  ventricular hypertrophy. Left ventricular diastolic parameters are  indeterminate.   2. Right ventricular systolic function is mildly reduced. The right  ventricular size is mildly enlarged. Tricuspid regurgitation signal is  inadequate for assessing PA pressure.   3. Left atrial size was mildly dilated.   4. Right atrial size was mildly dilated.   5. The mitral valve is normal in structure. Mild mitral valve  regurgitation. No evidence of mitral stenosis.   6. The aortic valve is tricuspid. There is severe calcifcation of the  aortic valve. Aortic valve regurgitation is mild to moderate. Paradoxical  low flow/low gradient severe aortic valve stenosis. Aortic valve area, by  VTI measures 0.80 cm. Aortic valve   mean gradient measures 33.0 mmHg.   7. The inferior vena cava is dilated in size with >50% respiratory  variability, suggesting right atrial pressure of 8 mmHg.   8. The patient was in atrial fibrillation.   CARDIAC CATH: November 2023 1. Elevated right and left heart filling pressures.  2. Pulmonary venous hypertension. 3. Peak to peak aortic valve gradient 18 mmHg 4. Nonobstructive  mild CAD.   STS RISK CALCULATOR: Pending  NHYA CLASS: 1    ASSESSMENT AND PLAN:   Preoperative cardiovascular examination  Nonrheumatic aortic valve stenosis - Plan: EKG 12-Lead, ECHOCARDIOGRAM COMPLETE  Mild CAD  PAF (paroxysmal atrial fibrillation) (HCC)  Hypercoagulable state due to paroxysmal atrial fibrillation (HCC)  Cardiac amyloidosis (HCC)  BMI 50.0-59.9, adult  (HCC)  Type 2 diabetes mellitus without complication, without long-term current use of insulin (HCC)  Hypertension associated with diabetes (HCC)  Hyperlipidemia associated with type 2 diabetes mellitus (HCC) - Plan: AMB Referral to [facility] Pharm-D  Preoperative cardiovascular assessment:  Upcoming dental extraction 05/06/24.  No cardiovascular risk procedure.  Per PharmD, hold Eliquis  x 24 hours for procedure.  No cardiac evaluation required. Aortic stenosis: Echocardiogram demonstrates progression of disease.  Her dominant right coronary sinus is very effaced.  Her coronary height however is around 17 mm.  The contrast injection on her last CT scan was somewhat poor.  She is relatively asymptomatic.  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  For now I will see her back in 3 months with another echocardiogram.  If she develops LV dysfunction or symptoms we will think about intervention.  If this is the case I will refer her to [facility] clinic given her effaced right sinus and the need for leaflet modification.  She had already been judged not to be a candidate for SAVR. Coronary artery disease:Continue Eliquis  5 mg twice daily, rosuvastatin  40 mg daily, as needed nitroglycerin . PAF: Continue Eliquis  5 mg twice daily, Toprol  100 mg daily Secondary hypercoagulable state: Continue Eliquis  5 mg twice daily Cardiac amyloidosis: Continue tafamidis  80 mg daily and vutrisiran  25 mg every 3 months Elevated BMI: Continue Mounjaro  15 mg q. Weekly T2DM: Continue Eliquis  5 mg twice daily, Jardiance  10 mg, rosuvastatin  40 mg.   Hypertension: Continue Toprol  100 mg twice daily.  BP well-controlled today. Hyperlipidemia: Continue rosuvastatin  40 mg daily and Zetia  10 mg daily.  LDL in August of this year was 53; given elevated LP(a) goal LDL is less than 55.  Will refer to pharmacy for further recommendations.  I spent 42 minutes reviewing all clinical data during and prior to this visit including all  relevant imaging studies, laboratories, clinical information from other health systems and prior notes from both Cardiology and other specialties, interviewing the patient, conducting a complete physical examination, and coordinating care in order to formulate a comprehensive and personalized evaluation and treatment plan.    Deina Lipsey K Laquan Beier, MD  04/25/2024 11:06 AM    [facility] [address] [place], [place], [place]  [address] Phone: [phone]; Fax: [phone]

## 2024-04-20 ENCOUNTER — Telehealth (HOSPITAL_BASED_OUTPATIENT_CLINIC_OR_DEPARTMENT_OTHER): Payer: Self-pay

## 2024-04-20 NOTE — Telephone Encounter (Signed)
   Pre-operative Risk Assessment    Patient Name: Kimberly [name]  DOB: 24-Oct-1955 MRN: [id number]   Date of last office visit: 02/15/24 with Dr. Rolan ( heart failure)  Date of next office visit: 04/25/24 with Dr. Wendel  Request for Surgical Clearance    Procedure:  Dental Extraction - Amount of Teeth to be Pulled:  10-simple  Date of Surgery:  Clearance 05/06/24                                 Surgeon:  NA Surgeon's Group or Practice Name:  [facility] Phone number:  [phone] Fax number:  [phone]   Type of Clearance Requested:   - Medical  - Pharmacy:  Hold Apixaban  (Eliquis ) not indicated   Type of Anesthesia:  Local    Additional requests/questions:    [name]   04/20/2024, 12:39 PM

## 2024-04-20 NOTE — Telephone Encounter (Signed)
 Will forward to pharmD for eliquis  hold.   Pt has appt with Dr. Wendel to discuss AS. Will defer dental procedure clearance to that appt. Anesthetic listed as local.

## 2024-04-21 ENCOUNTER — Other Ambulatory Visit: Payer: Self-pay

## 2024-04-21 NOTE — Progress Notes (Signed)
 Specialty Pharmacy Refill Coordination Note  Kimberly [name] is a 68 y.o. female contacted today regarding refills of specialty medication(s) Tafamidis  Meglumine  (Cardiac) (Vyndaqel )   Patient requested Delivery   Delivery date: 04/26/24   Verified address: [address]  [place] [address]   Medication will be filled on: 04/25/24

## 2024-04-21 NOTE — Telephone Encounter (Signed)
 Patient with diagnosis of PAF on Eliquis  for anticoagulation.    Procedure: Dental Extraction - Amount of Teeth to be Pulled:  10-simple  Date of procedure: 05/06/24    CHA2DS2-VASc Score = 5   This indicates a 7.2% annual risk of stroke. The patient's score is based upon: CHF History: 1 HTN History: 0 Diabetes History: 1 Stroke History: 0 Vascular Disease History: 1 Age Score: 1 Gender Score: 1      CrCl 81 mL/min  Platelet count 235 K   Patient has not  had an Afib/aflutter ablation in the last 3 months, DCCV within the last 4 weeks or a watchman implanted in the last 45 days   Patient does not require pre-op antibiotics for dental procedure.  Per office protocol, patient can hold Eliquis  for 1 days prior to procedure.   Patient will not need bridging with Lovenox (enoxaparin) around procedure.  **This guidance is not considered finalized until pre-operative APP has relayed final recommendations.**

## 2024-04-25 ENCOUNTER — Ambulatory Visit: Attending: Internal Medicine | Admitting: Internal Medicine

## 2024-04-25 ENCOUNTER — Encounter: Payer: Self-pay | Admitting: Internal Medicine

## 2024-04-25 VITALS — BP 116/68 | HR 96 | Ht 66.0 in | Wt 330.0 lb

## 2024-04-25 DIAGNOSIS — I2510 Atherosclerotic heart disease of native coronary artery without angina pectoris: Secondary | ICD-10-CM

## 2024-04-25 DIAGNOSIS — E119 Type 2 diabetes mellitus without complications: Secondary | ICD-10-CM

## 2024-04-25 DIAGNOSIS — E854 Organ-limited amyloidosis: Secondary | ICD-10-CM

## 2024-04-25 DIAGNOSIS — Z01810 Encounter for preprocedural cardiovascular examination: Secondary | ICD-10-CM

## 2024-04-25 DIAGNOSIS — E1169 Type 2 diabetes mellitus with other specified complication: Secondary | ICD-10-CM

## 2024-04-25 DIAGNOSIS — I152 Hypertension secondary to endocrine disorders: Secondary | ICD-10-CM

## 2024-04-25 DIAGNOSIS — I43 Cardiomyopathy in diseases classified elsewhere: Secondary | ICD-10-CM

## 2024-04-25 DIAGNOSIS — I350 Nonrheumatic aortic (valve) stenosis: Secondary | ICD-10-CM

## 2024-04-25 DIAGNOSIS — E1159 Type 2 diabetes mellitus with other circulatory complications: Secondary | ICD-10-CM

## 2024-04-25 DIAGNOSIS — I480 Paroxysmal atrial fibrillation: Secondary | ICD-10-CM | POA: Diagnosis not present

## 2024-04-25 DIAGNOSIS — E785 Hyperlipidemia, unspecified: Secondary | ICD-10-CM

## 2024-04-25 DIAGNOSIS — D6869 Other thrombophilia: Secondary | ICD-10-CM

## 2024-04-25 DIAGNOSIS — Z6841 Body Mass Index (BMI) 40.0 and over, adult: Secondary | ICD-10-CM

## 2024-04-25 NOTE — Patient Instructions (Addendum)
 Medication Instructions:  Your physician has recommended you make the following change in your medication:  STOP Jardiance   (this has been added to your allergy list)  *If you need a refill on your cardiac medications before your next appointment, please call your pharmacy*  Lab Work: None ordered  If you have any lab test that is abnormal or we need to change your treatment, we will call you to review the results.  Testing/Procedures: Your physician has requested that you have an echocardiogram in 2 months. Echocardiography is a painless test that uses sound waves to create images of your heart. It provides your doctor with information about the size and shape of your heart and how well your heart's chambers and valves are working. This procedure takes approximately one hour. There are no restrictions for this procedure. Please do NOT wear cologne, perfume, aftershave, or lotions (deodorant is allowed). Please arrive 15 minutes prior to your appointment time.  Please note: We ask at that you not bring children with you during ultrasound (echo/ vascular) testing. Due to room size and safety concerns, children are not allowed in the ultrasound rooms during exams. Our front office staff cannot provide observation of children in our lobby area while testing is being conducted. An adult accompanying a patient to their appointment will only be allowed in the ultrasound room at the discretion of the ultrasound technician under special circumstances. We apologize for any inconvenience.  Follow-Up: At [facility], you and your health needs are our priority.  As part of our continuing mission to provide you with exceptional heart care, our providers are all part of one team.  This team includes your primary Cardiologist (physician) and Advanced Practice Providers or APPs (Physician Assistants and Nurse Practitioners) who all work together to provide you with the care you need, when you need  it.  You have been referred to pharmD for lipid management  Your next appointment:   3 month(s)  Provider:   Arun K Thukkani, MD    We recommend signing up for the patient portal called MyChart.  Sign up information is provided on this After Visit Summary.  MyChart is used to connect with patients for Virtual Visits (Telemedicine).  Patients are able to view lab/test results, encounter notes, upcoming appointments, etc.  Non-urgent messages can be sent to your provider as well.   To learn more about what you can do with MyChart, go to [email or web address].   Thank you for choosing [facility]!!   Maeola Domino, RN [phone]   Other Instructions  Hold Eliquis  24 hours prior to your upcoming dental extraction

## 2024-04-25 NOTE — Telephone Encounter (Signed)
 Kimberly [name] is following up requesting updates on clearance. Please advise.  Phone number:  [phone] Fax number:  [phone]

## 2024-04-25 NOTE — Telephone Encounter (Signed)
   Patient Name: Kimberly [name]  DOB: 1955/09/23 MRN: [id number]  Primary Cardiologist: Arun K Thukkani, MD  Chart reviewed as part of pre-operative protocol coverage. Pre-op clearance already addressed by colleagues in earlier phone notes. To summarize recommendations:  -Preoperative cardiovascular assessment: Upcoming dental extraction 05/06/24. No cardiovascular risk procedure. Per PharmD, hold Eliquis  x 24 hours for procedure. No cardiac evaluation required.  -Dr. Wendel  Will route this bundled recommendation to requesting provider via Epic fax function and remove from pre-op pool. Please call with questions.  Orren LOISE Fabry, PA-C 04/25/2024, 4:41 PM

## 2024-04-25 NOTE — Telephone Encounter (Signed)
 Will forward to preop APP to review if the pt has been cleared. Pt had appt today with DR. Thukkani.

## 2024-05-03 ENCOUNTER — Other Ambulatory Visit: Payer: Self-pay

## 2024-05-06 ENCOUNTER — Other Ambulatory Visit (HOSPITAL_COMMUNITY): Payer: Self-pay

## 2024-05-07 ENCOUNTER — Other Ambulatory Visit (HOSPITAL_COMMUNITY): Payer: Self-pay | Admitting: Cardiology

## 2024-05-09 ENCOUNTER — Other Ambulatory Visit: Payer: Self-pay

## 2024-05-09 NOTE — Progress Notes (Signed)
 Specialty Pharmacy Refill Coordination Note  Kimberly [name] is a 68 y.o. female assessed today regarding refills of clinic administered specialty medication(s) Vutrisiran  Sodium (Amvuttra )   Clinic requested Courier to Provider Office   Delivery date: 05/11/24   Verified address: [facility] [address], [place], [address]   Medication will be filled on: 05/10/24  Appointment 05/16/24.

## 2024-05-10 ENCOUNTER — Other Ambulatory Visit (HOSPITAL_COMMUNITY): Payer: Self-pay

## 2024-05-10 ENCOUNTER — Other Ambulatory Visit: Payer: Self-pay

## 2024-05-11 ENCOUNTER — Other Ambulatory Visit: Payer: Self-pay

## 2024-05-16 ENCOUNTER — Other Ambulatory Visit: Payer: Self-pay

## 2024-05-16 ENCOUNTER — Ambulatory Visit (HOSPITAL_COMMUNITY)
Admission: RE | Admit: 2024-05-16 | Discharge: 2024-05-16 | Disposition: A | Discharge status: Home / Self Care | Source: Ambulatory Visit | Attending: Cardiology | Admitting: Cardiology

## 2024-05-16 DIAGNOSIS — I480 Paroxysmal atrial fibrillation: Secondary | ICD-10-CM | POA: Diagnosis not present

## 2024-05-16 DIAGNOSIS — I2510 Atherosclerotic heart disease of native coronary artery without angina pectoris: Secondary | ICD-10-CM | POA: Insufficient documentation

## 2024-05-16 DIAGNOSIS — I43 Cardiomyopathy in diseases classified elsewhere: Secondary | ICD-10-CM | POA: Insufficient documentation

## 2024-05-16 DIAGNOSIS — I5022 Chronic systolic (congestive) heart failure: Secondary | ICD-10-CM | POA: Insufficient documentation

## 2024-05-16 DIAGNOSIS — G5600 Carpal tunnel syndrome, unspecified upper limb: Secondary | ICD-10-CM | POA: Insufficient documentation

## 2024-05-16 DIAGNOSIS — G4733 Obstructive sleep apnea (adult) (pediatric): Secondary | ICD-10-CM | POA: Diagnosis not present

## 2024-05-16 DIAGNOSIS — Z79899 Other long term (current) drug therapy: Secondary | ICD-10-CM | POA: Insufficient documentation

## 2024-05-16 DIAGNOSIS — E669 Obesity, unspecified: Secondary | ICD-10-CM | POA: Insufficient documentation

## 2024-05-16 DIAGNOSIS — E854 Organ-limited amyloidosis: Secondary | ICD-10-CM | POA: Insufficient documentation

## 2024-05-16 DIAGNOSIS — I428 Other cardiomyopathies: Secondary | ICD-10-CM | POA: Diagnosis not present

## 2024-05-16 MED ORDER — VUTRISIRAN SODIUM 25 MG/0.5ML ~~LOC~~ SOSY
25.0000 mg | PREFILLED_SYRINGE | Freq: Once | SUBCUTANEOUS | Status: AC
  Administered 2024-05-16: 25 mg via SUBCUTANEOUS

## 2024-05-16 NOTE — Progress Notes (Signed)
 Medication has been picked up and is available in office for upcoming appointment.

## 2024-05-16 NOTE — Progress Notes (Signed)
 "  Advanced Heart Failure Clinic Note   PCP:  Kimberly Palma, PA-C  HF Cardiologist:  Dr. Rolan  HPI:  Kimberly [name] is a 68 y.o. female who has a history of paroxysmal atrial fibrillation, obesity, and nonischemic cardiomyopathy.  She was admitted in 07/2013 at [facility] with atrial fibrillation and CHF.  TEE was done in preparation for DCCV, showing EF 25-30% but there was LA thrombus so DCCV was not done.  Plan was for anticoagulation x 3 months followed by TEE-guided DCCV in the OR (due to patient's body habitus).  LHC was also done, showing mild nonobstructive CAD.  Patient was diuresed with IV Lasix  in the hospital and started on apixaban .    Patient had TEE again in 10/2013, showing EF 35-40% with diffuse hypokinesis and no LAA thrombus.  She was cardioverted to NSR (with difficulty).  Was started on amiodarone .  She then went back into atrial fibrillation.  After she had been loaded on amiodarone , cardioverted her again in 11/2013 to NSR.     She had gone back into atrial fibrillation in 05/2016 and amiodarone  was increased to BID in preparation for DCCV. However, she spontaneously converted to NSR.   PYP scan in 08/2017 was grade 3, strongly suggestive of TTR amyloidosis.  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  Echo 12/2017 showed EF 60%, moderate LVH, mild aortic stenosis.  Of note, she has carpal tunnel syndrome.  She was started on tafamidis .    She went back into atrial fibrillation and had DCCV in 06/2018 but was back in atrial fibrillation a few weeks later when she went to atrial fibrillation clinic.  Dr. Kelsie thought that morbid obesity precluded atrial fibrillation ablation and concerns about compliance led them not to start her on Tikosyn.  Instead, it was recommended that she start on CPAP for her OSA, lose weight, and re-attempt DCCV while on amiodarone .    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  In 7/21, she developed hematochezia and had EGD/colonoscopy which were unrevealing except for internal hemorrhoids.    Echo in 02/2021 showed EF 60-65%, normal RV, moderate AS mean gradient 20 mmHg with AVA 1.56 cm^2, mild AI.    Echo 04/08/22  EF 55-60%, mild LVH, mildly decreased RV systolic function with normal RV size, PASP 47 mmHg, paradoxical low flow/low gradient severe AS (mean 25 mmHg, AVA 0.6 cm^2).    Admitted after TEE/LHC/RHC in 11/23 for aortic stenosis workup. TEE showed an abnormal trileaflet aortic valve with doming of the right coronary cusp. There was severe aortic stenosis with moderate aortic insufficiency. LHC showed nonobstructive CAD. RHC showed elevated right and left heart filling pressures with pulmonary venous hypertension. Admitted with acute HFpEF. Diuresed well with IV Lasix , weight down 16 lbs during admission. Home diuretics regimen adjusted with torsemide  increased to 80 mg daily. She was referred to structural heart team for possible TAVR.    She saw [facility] who thought that she would be best-suited for TAVR.  She saw Dr Wendel who was concerned about the position of her RCA and worried about disruption of the ostium of the vessel with TAVR.  He recommended weight loss and SAVR.  She agreed to start Mounjaro . Echo was repeated in 06/2022, showing EF 55-60%, mild LVH, mild RV dysfunction, severe MR with AVA 0.8 cm^2 and mean gradient 33 mmHg.  Today she returns to HF  clinic for repeat injection of Amvuttra . Overall feeling ok today. No contraindications to injection. Injection administered in R arm.   Assessment/Plan: 1. 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  EF 35-40% on TEE in 10/2013 but then EF back to 60-65% by 02/2014 and remained normal on echo in 12/2017 and in 02/2021.  LHC without significant coronary disease in 07/2013.  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   - Stable NYHA class II-III.  - Continue torsemide  80 mg daily + KCl 70 mEq daily. - Continue Toprol  XL 100 mg BID. - Continue empagliflozin  10 mg daily.  2. 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  Atrial fibrillation and carpal tunnel syndrome, both of which she has, go along with amyloidosis.  Her history of orthostatic symptoms as well as symptoms in toes and feet consistent with peripheral neuropathy are also likely related to amyloidosis.  - Continue tafamidis .   - Amvuttra  (vutrisiran ) injection administered in clinic today. Patient tolerated injection well. Provided patient counseling on Amvuttra . Most common side effects are injection site reactions, arthralgias and dyspnea. Patient is aware to return to clinic every 3 months for repeat injection. Amvuttra  will be obtained from [facility] and couriered to clinic for injection.  - Continue vitamin A supplement 8000 IU daily. Amvuttra  decreases serum vitamin A levels. - She was seen by  Dr. 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 Given autosomal dominant condition, Kimberly [name] is at a 50% risk of inheriting the pathogenic variant in TTR (V142I). Genetic testing for the familial pathogenic variant was recommended but pt's [name] declined.   Follow up 3 months for repeat Amvuttra  injection.  Please do not hesitate to reach out with questions or concerns,  Jaun Bash, PharmD, CPP, BCPS, [facility] Heart Failure Pharmacist  Phone - [phone] 05/16/2024 1:03 PM  "

## 2024-05-17 ENCOUNTER — Other Ambulatory Visit: Payer: Self-pay

## 2024-05-18 ENCOUNTER — Other Ambulatory Visit (HOSPITAL_COMMUNITY): Payer: Self-pay

## 2024-05-18 ENCOUNTER — Ambulatory Visit: Admitting: Cardiology

## 2024-05-20 ENCOUNTER — Other Ambulatory Visit (HOSPITAL_COMMUNITY): Payer: Self-pay

## 2024-05-20 ENCOUNTER — Telehealth (HOSPITAL_COMMUNITY): Payer: Self-pay

## 2024-05-20 NOTE — Telephone Encounter (Signed)
 Advanced Heart Failure Patient Advocate Encounter  The patient was approved for a [employer] grant that will help cover the cost of Amvuttra , Eliquis , Metoprolol , Vyndamax .  Total amount awarded, $7,500.  Effective: 05/15/2024 - 05/14/2025.  BIN [id number] PCN [id number] Group [id number] ID [id number]  Approval and processing information added to [facility]. Left message for patient to anticipate change from Vyn[employer]el .  Rachel DEL, CPhT Rx Patient Advocate Phone: [phone]

## 2024-05-20 NOTE — Telephone Encounter (Signed)
 Advanced Heart Failure Patient Advocate Encounter  Prior authorization for Vyndamax  has been submitted and approved. Test billing returns $2100 for 30 day supply. [employer] grant is active which will cover pt cost to $0  Key: [id number] Effective: 05/19/2024 to 05/18/2025  Rachel DEL, CPhT Rx Patient Advocate Phone: [phone]

## 2024-05-23 ENCOUNTER — Other Ambulatory Visit (HOSPITAL_COMMUNITY): Payer: Self-pay | Admitting: Pharmacist

## 2024-05-23 ENCOUNTER — Other Ambulatory Visit: Payer: Self-pay

## 2024-05-23 ENCOUNTER — Other Ambulatory Visit (HOSPITAL_COMMUNITY): Payer: Self-pay

## 2024-05-23 MED ORDER — VYNDAMAX 61 MG PO CAPS
1.0000 | ORAL_CAPSULE | Freq: Every day | ORAL | 11 refills | Status: AC
  Filled 2024-05-23 – 2024-05-24 (×2): qty 30, 30d supply, fill #0
  Filled 2024-06-17: qty 30, 30d supply, fill #1

## 2024-05-24 ENCOUNTER — Other Ambulatory Visit: Payer: Self-pay

## 2024-05-24 NOTE — Progress Notes (Signed)
 Specialty Pharmacy Refill Coordination Note  Kimberly [name] is a 69 y.o. female contacted today regarding refills of specialty medication(s) Tafamidis  (Vyndamax )   Patient requested Delivery   Delivery date: 05/27/24   Verified address: [address]   [name] [address]   Medication will be filled on: 05/26/24

## 2024-05-26 ENCOUNTER — Other Ambulatory Visit: Payer: Self-pay

## 2024-05-30 ENCOUNTER — Other Ambulatory Visit (HOSPITAL_COMMUNITY): Payer: Self-pay

## 2024-06-02 ENCOUNTER — Telehealth (HOSPITAL_COMMUNITY): Payer: Self-pay | Admitting: Pharmacist

## 2024-06-02 NOTE — Telephone Encounter (Signed)
 Patient Advocate Encounter   Received notification from [facility] that prior authorization for Amvuttra  is required.   PA submitted on CoverMyMeds Key [id number] Status is pending   Will continue to follow.  Tinnie Redman, PharmD, BCPS, BCCP, CPP Heart Failure Clinic Pharmacist [phone]

## 2024-06-03 NOTE — Telephone Encounter (Signed)
 Advanced Heart Failure Patient Advocate Encounter  Prior Authorization for Amvuttra  has been approved.     Effective dates: 06/02/24 through 05/18/25  Tinnie Redman, PharmD, BCPS, BCCP, CPP Heart Failure Clinic Pharmacist [phone]

## 2024-06-12 ENCOUNTER — Other Ambulatory Visit (HOSPITAL_COMMUNITY): Payer: Self-pay | Admitting: Cardiology

## 2024-06-16 ENCOUNTER — Encounter (HOSPITAL_COMMUNITY)

## 2024-06-17 ENCOUNTER — Telehealth: Payer: Self-pay | Admitting: Internal Medicine

## 2024-06-17 ENCOUNTER — Other Ambulatory Visit: Payer: Self-pay

## 2024-06-17 ENCOUNTER — Other Ambulatory Visit: Payer: Self-pay | Admitting: Pharmacy Technician

## 2024-06-17 NOTE — Telephone Encounter (Signed)
 Pt had to reschedule 2/2 echo and is concerned she will not have it done before 2/6 appt with Dr. Wendel. Please advise.

## 2024-06-17 NOTE — Progress Notes (Signed)
 Specialty Pharmacy Refill Coordination Note  Kimberly [name] is a 69 y.o. female contacted today regarding refills of specialty medication(s) Tafamidis  (Vyndamax )   Patient requested Delivery   Delivery date: 06/22/24   Verified address: [address]   [place] [address]   Medication will be filled on: 06/21/24

## 2024-06-20 ENCOUNTER — Ambulatory Visit: Admitting: Pharmacist

## 2024-06-20 ENCOUNTER — Ambulatory Visit: Admission: RE | Admit: 2024-06-20 | Source: Ambulatory Visit

## 2024-06-21 ENCOUNTER — Other Ambulatory Visit: Payer: Self-pay

## 2024-06-21 ENCOUNTER — Ambulatory Visit (HOSPITAL_COMMUNITY)

## 2024-06-24 ENCOUNTER — Ambulatory Visit: Admitting: Internal Medicine

## 2024-06-24 ENCOUNTER — Ambulatory Visit (HOSPITAL_COMMUNITY)

## 2024-06-24 DIAGNOSIS — I152 Hypertension secondary to endocrine disorders: Secondary | ICD-10-CM

## 2024-06-24 DIAGNOSIS — D6869 Other thrombophilia: Secondary | ICD-10-CM

## 2024-06-24 DIAGNOSIS — E1169 Type 2 diabetes mellitus with other specified complication: Secondary | ICD-10-CM

## 2024-06-24 DIAGNOSIS — I2510 Atherosclerotic heart disease of native coronary artery without angina pectoris: Secondary | ICD-10-CM

## 2024-06-24 DIAGNOSIS — E854 Organ-limited amyloidosis: Secondary | ICD-10-CM

## 2024-06-24 DIAGNOSIS — E119 Type 2 diabetes mellitus without complications: Secondary | ICD-10-CM

## 2024-06-24 DIAGNOSIS — Z6841 Body Mass Index (BMI) 40.0 and over, adult: Secondary | ICD-10-CM

## 2024-06-24 DIAGNOSIS — I480 Paroxysmal atrial fibrillation: Secondary | ICD-10-CM

## 2024-06-24 DIAGNOSIS — I350 Nonrheumatic aortic (valve) stenosis: Secondary | ICD-10-CM

## 2024-06-27 ENCOUNTER — Ambulatory Visit (HOSPITAL_COMMUNITY)

## 2024-07-18 ENCOUNTER — Ambulatory Visit (HOSPITAL_COMMUNITY)

## 2024-07-29 ENCOUNTER — Ambulatory Visit: Admitting: Pharmacist

## 2024-08-03 ENCOUNTER — Ambulatory Visit (HOSPITAL_COMMUNITY)

## 2024-08-09 ENCOUNTER — Ambulatory Visit (HOSPITAL_COMMUNITY)
